# Patient Record
Sex: Female | Born: 1935
Health system: Southern US, Community
[De-identification: ages and names within clinical notes are randomized; demographics above are authoritative.]

## PROBLEM LIST (undated history)

## (undated) ENCOUNTER — Emergency Department (HOSPITAL_COMMUNITY): Admission: EM | Discharge status: Against Medical Advice | Payer: Medicare PPO

## (undated) DIAGNOSIS — I272 Pulmonary hypertension, unspecified: Secondary | ICD-10-CM

## (undated) DIAGNOSIS — G4700 Insomnia, unspecified: Secondary | ICD-10-CM

## (undated) DIAGNOSIS — I421 Obstructive hypertrophic cardiomyopathy: Secondary | ICD-10-CM

## (undated) DIAGNOSIS — K2100 Gastro-esophageal reflux disease with esophagitis, without bleeding: Secondary | ICD-10-CM

## (undated) DIAGNOSIS — I340 Nonrheumatic mitral (valve) insufficiency: Secondary | ICD-10-CM

## (undated) DIAGNOSIS — N183 Chronic kidney disease, stage 3 unspecified: Secondary | ICD-10-CM

## (undated) DIAGNOSIS — I351 Nonrheumatic aortic (valve) insufficiency: Secondary | ICD-10-CM

## (undated) DIAGNOSIS — K922 Gastrointestinal hemorrhage, unspecified: Secondary | ICD-10-CM

## (undated) DIAGNOSIS — F0390 Unspecified dementia without behavioral disturbance: Secondary | ICD-10-CM

## (undated) DIAGNOSIS — K219 Gastro-esophageal reflux disease without esophagitis: Secondary | ICD-10-CM

## (undated) DIAGNOSIS — I10 Essential (primary) hypertension: Secondary | ICD-10-CM

## (undated) DIAGNOSIS — I2510 Atherosclerotic heart disease of native coronary artery without angina pectoris: Secondary | ICD-10-CM

## (undated) DIAGNOSIS — K5731 Diverticulosis of large intestine without perforation or abscess with bleeding: Principal | ICD-10-CM

## (undated) DIAGNOSIS — E785 Hyperlipidemia, unspecified: Secondary | ICD-10-CM

## (undated) DIAGNOSIS — J45909 Unspecified asthma, uncomplicated: Secondary | ICD-10-CM

## (undated) DIAGNOSIS — R001 Bradycardia, unspecified: Secondary | ICD-10-CM

## (undated) HISTORY — DX: Insomnia, unspecified: G47.00

## (undated) HISTORY — DX: Unspecified asthma, uncomplicated: J45.909

## (undated) HISTORY — PX: HERNIA REPAIR: SHX51

## (undated) HISTORY — PX: TUBAL LIGATION: SHX77

## (undated) HISTORY — PX: COLONOSCOPY: SHX174

## (undated) HISTORY — DX: Obstructive hypertrophic cardiomyopathy: I42.1

## (undated) HISTORY — PX: COLON RESECTION: SHX5231

## (undated) HISTORY — PX: CARDIAC CATHETERIZATION: SHX172

## (undated) HISTORY — PX: CATARACT EXTRACTION W/ INTRAOCULAR LENS  IMPLANT, BILATERAL: SHX1307

## (undated) HISTORY — DX: Gastro-esophageal reflux disease without esophagitis: K21.9

## (undated) HISTORY — DX: Nonrheumatic aortic (valve) insufficiency: I35.1

## (undated) HISTORY — DX: Bradycardia, unspecified: R00.1

## (undated) HISTORY — DX: Gastro-esophageal reflux disease with esophagitis: K21.0

## (undated) HISTORY — PX: CHOLECYSTECTOMY: SHX55

## (undated) HISTORY — PX: APPENDECTOMY: SHX54

## (undated) HISTORY — DX: Pulmonary hypertension, unspecified: I27.20

## (undated) HISTORY — PX: TONSILLECTOMY: SUR1361

## (undated) HISTORY — DX: Gastro-esophageal reflux disease with esophagitis, without bleeding: K21.00

---

## 1998-02-18 ENCOUNTER — Other Ambulatory Visit: Admission: RE | Admit: 1998-02-18 | Discharge: 1998-02-18 | Payer: Self-pay | Admitting: Obstetrics and Gynecology

## 1998-02-25 ENCOUNTER — Ambulatory Visit (HOSPITAL_COMMUNITY): Admission: RE | Admit: 1998-02-25 | Discharge: 1998-02-25 | Payer: Self-pay | Admitting: Specialist

## 1998-02-25 ENCOUNTER — Encounter: Payer: Self-pay | Admitting: Specialist

## 1998-03-07 ENCOUNTER — Other Ambulatory Visit: Admission: RE | Admit: 1998-03-07 | Discharge: 1998-03-07 | Payer: Self-pay | Admitting: Obstetrics and Gynecology

## 1998-10-06 ENCOUNTER — Encounter (HOSPITAL_BASED_OUTPATIENT_CLINIC_OR_DEPARTMENT_OTHER): Payer: Self-pay | Admitting: General Surgery

## 1998-10-08 ENCOUNTER — Ambulatory Visit (HOSPITAL_COMMUNITY): Admission: RE | Admit: 1998-10-08 | Discharge: 1998-10-08 | Payer: Self-pay | Admitting: General Surgery

## 1999-01-13 ENCOUNTER — Ambulatory Visit (HOSPITAL_COMMUNITY): Admission: RE | Admit: 1999-01-13 | Discharge: 1999-01-13 | Payer: Self-pay | Admitting: Gastroenterology

## 1999-04-29 ENCOUNTER — Other Ambulatory Visit: Admission: RE | Admit: 1999-04-29 | Discharge: 1999-04-29 | Payer: Self-pay | Admitting: Obstetrics and Gynecology

## 1999-05-15 ENCOUNTER — Ambulatory Visit (HOSPITAL_COMMUNITY): Admission: RE | Admit: 1999-05-15 | Discharge: 1999-05-15 | Payer: Self-pay | Admitting: Obstetrics and Gynecology

## 1999-05-15 ENCOUNTER — Encounter: Payer: Self-pay | Admitting: Obstetrics and Gynecology

## 2000-05-26 ENCOUNTER — Encounter: Payer: Self-pay | Admitting: Obstetrics and Gynecology

## 2000-05-26 ENCOUNTER — Ambulatory Visit (HOSPITAL_COMMUNITY): Admission: RE | Admit: 2000-05-26 | Discharge: 2000-05-26 | Payer: Self-pay | Admitting: Obstetrics and Gynecology

## 2002-01-30 ENCOUNTER — Ambulatory Visit (HOSPITAL_COMMUNITY): Admission: RE | Admit: 2002-01-30 | Discharge: 2002-01-30 | Payer: Self-pay | Admitting: Gastroenterology

## 2002-04-09 ENCOUNTER — Other Ambulatory Visit: Admission: RE | Admit: 2002-04-09 | Discharge: 2002-04-09 | Payer: Self-pay | Admitting: Obstetrics and Gynecology

## 2002-04-13 ENCOUNTER — Encounter: Payer: Self-pay | Admitting: Obstetrics and Gynecology

## 2002-04-13 ENCOUNTER — Ambulatory Visit (HOSPITAL_COMMUNITY): Admission: RE | Admit: 2002-04-13 | Discharge: 2002-04-13 | Payer: Self-pay | Admitting: Obstetrics and Gynecology

## 2003-07-14 ENCOUNTER — Emergency Department (HOSPITAL_COMMUNITY): Admission: EM | Admit: 2003-07-14 | Discharge: 2003-07-14 | Payer: Self-pay | Admitting: Emergency Medicine

## 2004-04-20 ENCOUNTER — Emergency Department (HOSPITAL_COMMUNITY): Admission: EM | Admit: 2004-04-20 | Discharge: 2004-04-21 | Payer: Self-pay | Admitting: *Deleted

## 2004-04-28 ENCOUNTER — Encounter: Admission: RE | Admit: 2004-04-28 | Discharge: 2004-04-28 | Payer: Self-pay | Admitting: Gastroenterology

## 2004-05-08 ENCOUNTER — Encounter: Admission: RE | Admit: 2004-05-08 | Discharge: 2004-05-08 | Payer: Self-pay | Admitting: General Surgery

## 2005-05-24 ENCOUNTER — Encounter (INDEPENDENT_AMBULATORY_CARE_PROVIDER_SITE_OTHER): Payer: Self-pay | Admitting: Cardiology

## 2005-05-24 ENCOUNTER — Ambulatory Visit (HOSPITAL_COMMUNITY): Admission: RE | Admit: 2005-05-24 | Discharge: 2005-05-24 | Payer: Self-pay | Admitting: Cardiology

## 2005-06-07 HISTORY — PX: CARDIAC VALVE REPLACEMENT: SHX585

## 2005-06-07 HISTORY — PX: MYOMECTOMY: SHX85

## 2006-02-14 ENCOUNTER — Encounter: Admission: RE | Admit: 2006-02-14 | Discharge: 2006-02-14 | Payer: Self-pay | Admitting: Cardiology

## 2006-02-28 ENCOUNTER — Ambulatory Visit (HOSPITAL_COMMUNITY): Admission: RE | Admit: 2006-02-28 | Discharge: 2006-02-28 | Payer: Self-pay | Admitting: Cardiology

## 2006-04-11 ENCOUNTER — Encounter (INDEPENDENT_AMBULATORY_CARE_PROVIDER_SITE_OTHER): Payer: Self-pay | Admitting: Specialist

## 2006-04-11 ENCOUNTER — Inpatient Hospital Stay (HOSPITAL_COMMUNITY): Admission: RE | Admit: 2006-04-11 | Discharge: 2006-04-16 | Payer: Self-pay | Admitting: Cardiothoracic Surgery

## 2009-03-01 ENCOUNTER — Emergency Department (HOSPITAL_COMMUNITY): Admission: EM | Admit: 2009-03-01 | Discharge: 2009-03-01 | Payer: Self-pay | Admitting: Emergency Medicine

## 2009-03-01 ENCOUNTER — Emergency Department (HOSPITAL_COMMUNITY): Admission: EM | Admit: 2009-03-01 | Discharge: 2009-03-01 | Payer: Self-pay | Admitting: Family Medicine

## 2009-04-07 ENCOUNTER — Encounter: Admission: RE | Admit: 2009-04-07 | Discharge: 2009-04-07 | Payer: Self-pay | Admitting: Gastroenterology

## 2009-12-19 ENCOUNTER — Emergency Department (HOSPITAL_COMMUNITY): Admission: EM | Admit: 2009-12-19 | Discharge: 2009-12-20 | Payer: Self-pay | Admitting: Emergency Medicine

## 2010-01-01 ENCOUNTER — Encounter: Admission: RE | Admit: 2010-01-01 | Discharge: 2010-01-01 | Payer: Self-pay | Admitting: Family Medicine

## 2010-01-06 ENCOUNTER — Encounter: Admission: RE | Admit: 2010-01-06 | Discharge: 2010-01-06 | Payer: Self-pay | Admitting: Gastroenterology

## 2010-08-22 LAB — URINALYSIS, ROUTINE W REFLEX MICROSCOPIC
Bilirubin Urine: NEGATIVE
Glucose, UA: NEGATIVE mg/dL
Hgb urine dipstick: NEGATIVE
Ketones, ur: NEGATIVE mg/dL
Nitrite: NEGATIVE
Protein, ur: NEGATIVE mg/dL
Specific Gravity, Urine: 1.007 (ref 1.005–1.030)
Urobilinogen, UA: 0.2 mg/dL (ref 0.0–1.0)
pH: 7 (ref 5.0–8.0)

## 2010-08-22 LAB — DIFFERENTIAL
Basophils Absolute: 0 10*3/uL (ref 0.0–0.1)
Basophils Relative: 0 % (ref 0–1)
Eosinophils Absolute: 0.3 10*3/uL (ref 0.0–0.7)
Eosinophils Relative: 4 % (ref 0–5)
Lymphocytes Relative: 30 % (ref 12–46)
Lymphs Abs: 2.1 10*3/uL (ref 0.7–4.0)
Monocytes Absolute: 0.6 10*3/uL (ref 0.1–1.0)
Monocytes Relative: 9 % (ref 3–12)
Neutro Abs: 3.9 10*3/uL (ref 1.7–7.7)
Neutrophils Relative %: 56 % (ref 43–77)

## 2010-08-22 LAB — CBC
HCT: 32.7 % — ABNORMAL LOW (ref 36.0–46.0)
Hemoglobin: 11.2 g/dL — ABNORMAL LOW (ref 12.0–15.0)
MCH: 30.8 pg (ref 26.0–34.0)
MCHC: 34.2 g/dL (ref 30.0–36.0)
MCV: 89.8 fL (ref 78.0–100.0)
Platelets: 202 10*3/uL (ref 150–400)
RBC: 3.64 MIL/uL — ABNORMAL LOW (ref 3.87–5.11)
RDW: 14.1 % (ref 11.5–15.5)
WBC: 6.9 10*3/uL (ref 4.0–10.5)

## 2010-08-22 LAB — POCT CARDIAC MARKERS
CKMB, poc: <1 ng/mL — ABNORMAL LOW (ref 1.0–8.0)
Myoglobin, poc: 82.3 ng/mL (ref 12–200)
Troponin i, poc: <0.05 ng/mL (ref 0.00–0.09)

## 2010-08-22 LAB — POCT I-STAT, CHEM 8
BUN: 7 mg/dL (ref 6–23)
Calcium, Ion: 1.16 mmol/L (ref 1.12–1.32)
Chloride: 101 mEq/L (ref 96–112)
Creatinine, Ser: 1.4 mg/dL — ABNORMAL HIGH (ref 0.4–1.2)
Glucose, Bld: 97 mg/dL (ref 70–99)
HCT: 35 % — ABNORMAL LOW (ref 36.0–46.0)
Hemoglobin: 11.9 g/dL — ABNORMAL LOW (ref 12.0–15.0)
Potassium: 3.8 mEq/L (ref 3.5–5.1)
Sodium: 136 mEq/L (ref 135–145)
TCO2: 25 mmol/L (ref 0–100)

## 2010-08-22 LAB — URINE CULTURE
Colony Count: NO GROWTH
Culture: NO GROWTH

## 2010-08-22 LAB — CK: Total CK: 86 U/L (ref 7–177)

## 2010-09-11 LAB — CBC
HCT: 37.1 % (ref 36.0–46.0)
Hemoglobin: 12.7 g/dL (ref 12.0–15.0)
MCHC: 34.2 g/dL (ref 30.0–36.0)
MCV: 89.7 fL (ref 78.0–100.0)
Platelets: 217 10*3/uL (ref 150–400)
RBC: 4.14 MIL/uL (ref 3.87–5.11)
RDW: 13.9 % (ref 11.5–15.5)
WBC: 6.3 10*3/uL (ref 4.0–10.5)

## 2010-09-11 LAB — DIFFERENTIAL
Basophils Absolute: 0 10*3/uL (ref 0.0–0.1)
Basophils Relative: 0 % (ref 0–1)
Eosinophils Absolute: 0.2 10*3/uL (ref 0.0–0.7)
Eosinophils Relative: 3 % (ref 0–5)
Lymphocytes Relative: 30 % (ref 12–46)
Lymphs Abs: 1.9 10*3/uL (ref 0.7–4.0)
Monocytes Absolute: 0.6 10*3/uL (ref 0.1–1.0)
Monocytes Relative: 10 % (ref 3–12)
Neutro Abs: 3.5 10*3/uL (ref 1.7–7.7)
Neutrophils Relative %: 56 % (ref 43–77)

## 2010-09-11 LAB — COMPREHENSIVE METABOLIC PANEL
ALT: 17 U/L (ref 0–35)
AST: 19 U/L (ref 0–37)
Albumin: 3.8 g/dL (ref 3.5–5.2)
Alkaline Phosphatase: 47 U/L (ref 39–117)
BUN: 9 mg/dL (ref 6–23)
CO2: 26 mEq/L (ref 19–32)
Calcium: 9 mg/dL (ref 8.4–10.5)
Chloride: 100 mEq/L (ref 96–112)
Creatinine, Ser: 0.97 mg/dL (ref 0.4–1.2)
GFR calc Af Amer: >60 mL/min (ref >60)
GFR calc non Af Amer: 55 mL/min — ABNORMAL LOW (ref >60)
Glucose, Bld: 92 mg/dL (ref 70–99)
Potassium: 4 mEq/L (ref 3.5–5.1)
Sodium: 133 mEq/L — ABNORMAL LOW (ref 135–145)
Total Bilirubin: 0.9 mg/dL (ref 0.3–1.2)
Total Protein: 7.2 g/dL (ref 6.0–8.3)

## 2010-09-11 LAB — URINALYSIS, ROUTINE W REFLEX MICROSCOPIC
Bilirubin Urine: NEGATIVE
Glucose, UA: NEGATIVE mg/dL
Hgb urine dipstick: NEGATIVE
Ketones, ur: NEGATIVE mg/dL
Nitrite: NEGATIVE
Protein, ur: NEGATIVE mg/dL
Specific Gravity, Urine: 1.008 (ref 1.005–1.030)
Urobilinogen, UA: 0.2 mg/dL (ref 0.0–1.0)
pH: 6 (ref 5.0–8.0)

## 2010-09-11 LAB — TROPONIN I: Troponin I: 0.02 ng/mL (ref 0.00–0.06)

## 2010-09-11 LAB — URINE MICROSCOPIC-ADD ON

## 2010-10-20 NOTE — Assessment & Plan Note (Signed)
[facility]                                 ON-CALL NOTE   [name], [name]                        MRN:          [id number]  DATE:03/01/2009                            DOB:          March 05, 1925    This is an 77 year old patient of Dr. Margarita Rana I am covering for.  [name] was seen approximately 4-5 days ago by Dr. Loreta Ave.  The patient  states she was complaining of bloating and loose stools and abdominal  distress.  From what I can tell Align and Xifaxan were prescribed.  Overnight she felt worse and is having a boiling in her stomach and  postprandial pain and nausea and feels hot though she does not think she  has had a fever.  The stools are still somewhat loose but that may not  be as much of a problem.  She says she feels very weak.  She relates  that labs drawn by Dr. Loreta Ave were normal this week.  I advised her to go  to the [facility] to be evaluated further and to have  other diagnoses considered as a cause.  Her daughter was on the way to  her house to bring her there.     Iva Boop, MD,FACG  Electronically Signed    CEG/MedQ  DD: 03/01/2009  DT: 03/01/2009  Job #: [id number]

## 2010-10-23 NOTE — Cardiovascular Report (Signed)
Jasmine [name], ORLOV               ACCOUNT NO.:  [id number]   MEDICAL RECORD NO.:  [id number]          PATIENT TYPE:  OIB   LOCATION:  2899                         FACILITY:  [facility]   PHYSICIAN:  Armanda Magic, M.D.     DATE OF BIRTH:  [birth year]   DATE OF PROCEDURE:  DATE OF DISCHARGE:  02/28/2006                              CARDIAC CATHETERIZATION   PROCEDURE:  Left and right heart catheterization, coronary angiography, left  ventriculography   OPERATOR:  Armanda Magic, M.D.   INDICATIONS:  Severe mitral regurgitation.   COMPLICATIONS:  None.   IV ACCESS:  Right femoral artery, 6-French sheath.   MEDICATIONS:  Versed 1 mg IV   This is a very pleasant 81-year black female with a history of severe mitral  regurgitation.  She now presents for cardiac catheterization to rule out  underlying coronary artery disease prior to undergoing mitral valve  replacement; also to assess for pulmonary hypertension.   The patient was brought to the cardiac catheterization laboratory in a  fasting non sedated state.  Informed consent was obtained.  The patient was  connected to continuous heart rate and pulse oximetry monitoring and  intermittent blood pressure monitor.  The right groin was prepped and draped  in sterile fashion.  One percent Xylocaine was used for local anesthesia.  Using modified Seldinger technique a 6-French sheath was placed in the right  femoral artery.  Using modified Seldinger technique a 7-French sheath was  placed in the right femoral vein.  Under fluoroscopic guidance a 6-French  Swan-Ganz catheter was placed in the right atrium under balloon flotation  and fluoroscopy.  The right atrial pressure was measured.  The catheter was  then advanced into the right ventricle.  Right ventricular pressure was  measured.  The catheter was then attempted to be placed into the pulmonary  artery but could not.  It was eventually placed into the main pulmonary  artery with the  guidance of a guidewire.  The catheter was then allowed to  float into the wedge position and pulmonary capillary wedge pressure was  measured.  The balloon was then deflated.  The catheter was pulled back into  the main pulmonary artery where pulmonary artery pressure was measured.  Cardiac output by thermal dilution was then obtained using 10 cc of saline  with each injection.  The catheter was then removed under fluoroscopic  guidance and under fluoroscopic guidance a 6-French JL-4 catheter was placed  in the left coronary artery.  Multiple cine films were taken in 30 degree  RAO, 40 degree LAO views.  The catheter was exchanged out for 6-French JR-4  catheter which was placed under fluoroscopic guidance in the right coronary  artery.  Multiple cine films were taken at 30 degree RAO and 40 degree LAO  views.  This catheter was exchanged out over a guidewire for 6-French angled  pigtail catheter which was placed under fluoroscopic guidance in the left  ventricular cavity.  Left ventriculography was performed in 30 degree RAO  view using total of 30 cc of contrast at 15 cc per second.  The catheter was  then pulled back across the aortic valve with no significant gradient noted.  At the end of the procedure all catheters and sheaths were removed.  Manual  compression was performed until adequate hemostasis was obtained.  The  patient was transferred back to room in stable condition.   RESULTS:  1. Right heart cath data:  Right atrial pressure 4/3 with a mean of 2      mmHg.  RV pressure 30/0 with a mean of 3 mmHg.  PA pressure 26/5 with a      mean of 14 mmHg.  Pulmonary capillary wedge 7/7 with a mean of 5 mmHg.      Left ventricular pressure 175 over 1 mmHg, LVEDP 17 mmHg.  Aortic      pressure was 66/39 mmHg.  The cardiac output by thermal dilution was      3.1.  Overall left ventricular systolic function was normal.  EF 60%.      Left atrium was markedly dilated with severe mitral  regurgitation which      opacified the left atrium within the first cardiac beat.   The left coronary artery is widely patent and bifurcates in the left  anterior descending artery and left circumflex artery.  Left anterior  descending artery is widely patent throughout its course, the apex giving  rise to one very large diagonal branch.  Diagonal has a 30% eccentric  narrowing in proximal portion and trifurcates into three daughter vessels,  all of which are widely patent.   The left circumflex is widely patent throughout its course in the AV groove,  giving rise to three obtuse marginal branches, all of which are widely  patent.   The right coronary artery has luminal irregularities throughout with an 20-  30% eccentric narrowing in the ostium of the RCA.  It bifurcates distally  into the posterior descending artery and posterolateral artery, both of  which are widely patent.   ASSESSMENT:  1. Nonobstructive coronary disease.  2. Normal LV function.  3. Dilated left atrium with severe mitral regurgitation.  4. No evidence of pulmonary hypertension.  5. Elevated left ventricular end-diastolic pressure secondary to MR.   PLAN:  Discharge home after IV fluid and bedrest.  Outpatient CVTS  consult  with Dr. Morton Peters for mitral valve repair.  Follow up with me in three  months.      Armanda Magic, M.D.  Electronically Signed     TT/MEDQ  D:  03/01/2006  T:  03/02/2006  Job:  [id number]   cc:   Kerin Perna, M.D.

## 2010-10-23 NOTE — Op Note (Signed)
NAMEIVAN, MASKELL NO.:  [id number]   MEDICAL RECORD NO.:  [id number]          PATIENT TYPE:  INP   LOCATION:  2305                         FACILITY:  [facility]   PHYSICIAN:  Kaylyn Layer. Michelle Piper, M.D.   DATE OF BIRTH:  Jan 19, 1925   DATE OF PROCEDURE:  04/11/2006  DATE OF DISCHARGE:                                 OPERATIVE REPORT   PROCEDURE PERFORMED:  Transesophageal echocardiogram probe placement and  monitoring during mitral valve replacement.   BODY OF REPORT:  Was consulted by Dr. Kathlee Nations Trigt to place a  transesophageal echo into [facility] for intraoperative evaluation and help  with management during her mitral valve replacement procedure.   Miss Woodbury is a 77 year old lady with significant mitral regurgitation, who  presented to the operating room today for mitral valve replacement.   After uneventful induction of anesthesia and endotracheal intubation, the  transesophageal echo probe was easily placed into the stomach.  Initial  short axis view of the left ventricle showed significant left ventricular  hypertrophy with a small, asymmetric ventricular cavity which collapsed  almost completely on systole.  Her heart was rotated, so it was difficult to  get a good four-chamber view, but initially looking at the left ventricular  outflow tract, there was some septal hypertrophy with narrowing of the  outflow tract.  Turning to the mitral valve, the valve leaflets seemed to  coapt normally.  Placing color flow Doppler across the valve, there was 3 to  4+ mitral regurgitation into the left atrium.  This was present in all  views.  On imaging the 160 to 180 view with the aortic valve and outflow  tract being imaged, it clearly could be seen that there was SAM with  collapse of the anterior leaflet of the mitral valve against the  intraventricular septum during systole, creating a mitral regurgitation  situation.  On evaluating the valve from the deep gastric  view, there was a  gradient of 95 mmHg, which was seen with dagger-shaped images in continuous  wave Doppler.  This seemed to suggest that the dominant lesion was outflow  obstruction, which was subaortic in nature.  Returning to the aortic valve,  it was tricuspid.  There was a small bit of central aortic insufficiency,  but otherwise  was within normal limits.  Intra-atrial septum was normal,  the left atrium was normal.  The pulmonary vein was forward.  The rest of  the heart was normal, but there was a small amount of tricuspid  regurgitation transferred with the presence of a pulmonary artery catheter.   The patient was placed on cardiopulmonary bypass by Dr. Donata Clay and  underwent a mitral valve replacement and myomectomy.  Immediately prior to  discontinuing cardiopulmonary bypass, I evaluated the patient.  There was  minimal intracardiac air.  The left ventricle had a small cavity and was  discontinued from cardiopulmonary bypass with minimal inotropes and there  were no wall motion abnormalities in the left ventricle at this point.  The  prosthetic mitral valve could be seen to be functioning normally.  There was  absence of SAM and the mitral regurgitation, and the outflow tract, despite  there being some turbulence seen, was more patent, although it was difficult  to measure, due to shudders from the prosthetic valve.  Otherwise, the rest  of the cardiac exam was unchanged.   At the end of the procedure, the transesophageal echo probe was removed from  the patient.  She was taken to the intensive care unit in stable condition.           ______________________________  Kaylyn Layer Michelle Piper, M.D.     KDO/MEDQ  D:  04/11/2006  T:  04/12/2006  Job:  [id number]   cc:   Anesthesia Department

## 2010-10-23 NOTE — Op Note (Signed)
   NAME:  Jasmine [name], Jasmine [name]                         ACCOUNT NO.:  [id number]   MEDICAL RECORD NO.:  [id number]                   PATIENT TYPE:  AMB   LOCATION:  ENDO                                 FACILITY:  [facility]   PHYSICIAN:  Charna Elizabeth, M.D.                   DATE OF BIRTH:  May 25, 1925   DATE OF PROCEDURE:  01/30/2002  DATE OF DISCHARGE:                                 OPERATIVE REPORT   PROCEDURE:  Colonoscopy.   ENDOSCOPIST:  Charna Elizabeth, M.D.   INSTRUMENT USED:  Olympus video colonoscope.   INDICATIONS:  A 77 year old African-American female with a history of  sigmoid colectomy for a tumor, status post takedown of colostomy,  undergoing screening colonoscopy.  Rule out colonic polyps, masses,  hemorrhoids, etc.   PREPROCEDURE PREPARATION:  Informed consent was procured from the patient.  The patient was fasted for eight hours prior to the procedure and prepped  with a gallon of magnesium citrate and a gallon of NuLytely the night prior  to the procedure.   PREPROCEDURE PHYSICAL:  VITAL SIGNS:  The patient had stable vital signs.  NECK:  Supple.  CHEST:  Clear to auscultation.  S1, S2 regular.  ABDOMEN:  Soft with normal bowel sounds.   DESCRIPTION OF PROCEDURE:  The patient was placed in the left lateral  decubitus position and sedated with 30 mg of Demerol and 3 mg of Versed  intravenously.  Once the patient was adequately sedate and maintained on low-  flow oxygen and continuous cardiac monitoring, the Olympus video colonoscope  was advanced from the rectum to the cecum without difficulty.  There was  evidence of extensive pandiverticulosis with a healthy anastomosis at 15 cm.  Small internal hemorrhoids were seen on retroflexion.   IMPRESSION:  1. Pandiverticulosis.  2. Healthy anastomosis at 15 cm.  3. Small, nonbleeding internal hemorrhoids.   RECOMMENDATIONS:  1. Repeat colorectal cancer screening is recommended in the next five years     unless the patient  develops any abnormal symptoms in the interim.  2.     A high-fiber diet has been advised, and brochures on diverticulosis have     been handed to the patient for her education.  3. Outpatient follow-up on a p.r.n. basis.                                               Charna Elizabeth, M.D.    JM/MEDQ  D:  01/31/2002  T:  02/04/2002  Job:  [id number]   cc:   Jethro Bastos, M.D.  [address]  [place]  [place] [address]  Fax: [phone]   Mardene Celeste. Lurene Shadow, M.D.

## 2010-10-23 NOTE — Op Note (Signed)
NAMEWALTER, MIN NO.:  [id number]   MEDICAL RECORD NO.:  [id number]          PATIENT TYPE:  INP   LOCATION:  2305                         FACILITY:  [facility]   PHYSICIAN:  Kerin Perna, M.D.  DATE OF BIRTH:  08/05/1924   DATE OF PROCEDURE:  04/11/2006  DATE OF DISCHARGE:                                 OPERATIVE REPORT   OPERATION:  1. Left ventricular septal myomectomy for idiopathic hypertrophic      subaortic stenosis.  2. Mitral valve replacement with a 27-mm Biocor pericardial valve, model      number B10-5M, serial number [id number].   SURGEON:  Kerin Perna, M.D.   ASSISTANT:  Evelene Croon, M.D.   ANESTHESIA:  General by Arta Bruce, M.D.   PREOPERATIVE DIAGNOSIS:  Idiopathic hypertrophic subaortic stenosis with  severe mitral regurgitation.   POSTOPERATIVE DIAGNOSIS:  Idiopathic hypertrophic subaortic stenosis with  severe mitral regurgitation.   INDICATIONS:  The patient is a 77 year old female with a loud murmur and an  echocardiogram showing severe LV concentric hypertrophy with systolic  anterior motion of the mitral valve and severe mitral regurgitation.  She  had no significant coronary disease and the aortic valve itself was intact.  It was felt the patient would benefit for mitral valve replacement or repair  and myomectomy for her subannular outflow tract obstruction associated with  severe mitral regurgitation.   Prior to the operation, I examined the patient in the office and reviewed  results the cardiac cath and echo with the patient.  I discussed indications  and expected benefits of mitral valve repair or replacement and LV  myomectomy.  I reviewed the alternatives to surgical therapy.  I discussed  with the patient the major issues of the surgery including the location of  the incisions, the use of general anesthesia and cardiopulmonary bypass, and  the expected postoperative hospital recovery.  I discussed with the  patient  the preference to use a bioprosthetic valve or a mechanical valve and mitral  valve replacement was indicated rather than mitral valve repair.  I  discussed with her the risks of mitral valve replacement and LV myomectomy  including the risks of heart block with pacemaker requirement, bleeding,  blood transfusion requirement, MI, stroke, infection, and death.  She  understood these issues and agreed to proceed with operation under what I  felt was an informed consent.   OPERATIVE FINDINGS:  The preoperative TEE showed severe subaortic outflow  tract obstruction with a gradient over 50 mmHg.  The aortic valve itself was  without significant stenosis or insufficiency.  There was severe septal  anterior motion (SAM) of the anterior leaf of the mitral valve with severe  mitral regurgitation.  It was felt that in a patient almost 77 years old  that the best procedure in the situation would be septal myomectomy with  mitral valve replacement with a bioprosthetic valve.   PROCEDURE:  The patient was brought to the operating room and placed supine  on the operating table, where general anesthesia was induced.  The  transesophageal 2-D echo probe was  placed by the anesthesiologist.  A  diagnosis of subaortic stenosis with systolic anterior motion of the mitral  valve and severe mitral regurgitation was documented.  There was no evidence  of prolapse of the anterior leaflet or posterior leaflet segments.  The  patient was prepped and draped as a sterile field.  A sternal incision was  made.  The sternum was retracted and the pericardium was opened.  Heparin  was administered.  Pursestrings were placed in the ascending aorta and right  atrium and the patient was cannulated and placed on bypass.  A second  pursestring was placed in the lower right atrium for double cannulation and  optimal venous drainage.  The interatrial groove was dissected and caval  tapes were placed around the  superior and inferior vena caval cannula.  Cardioplegia catheters were placed for both antegrade aortic and retrograde  coronary sinus cardioplegia.  The patient was cooled to 30 degrees and the  aortic crossclamp was applied.  Eight hundred milliliters of cold blood  cardioplegia were delivered in split doses between the aortic cannula and  the coronary sinus cannula.  There was good cardioplegic arrest and septal  temperature dropped to less than 12 degrees.   A transverse aortotomy was made and the aortic valve was inspected.  It had  3 cusps, which were normal.  There was no evidence of stenosis.  The valve  was retracted and subannular septal muscle was inspected.  There was  evidence of a plaque formation under the right coronary cusp and this was  excised using a 15 blade scalpel to create a trench 1 cm wide and 8 mm deep,  extending down into the septum.  This was submitted to Pathology.  A second  area of white plaque formation from abnormal contact with the anterior  leaflet of the mitral valve closer to the right coronary commissure was also  excised.  The area was irrigated and inspected and it was felt that  there  was a good outflow tract created from the myomectomy.  The aortotomy was  then closed in 2 layers using running 4-0 Prolene.  Cardioplegia was redosed  every 20 minutes during the crossclamp period.   A left atriotomy was then made as the caval tapes were tightened.  The  atrial retractors were positioned.  Exposure was felt to be adequate.  The  anterior leaflet was thickened.  Valve analysis with cold saline test showed  no evidence of segmental prolapse.  This was confirmed by valve analysis  using the mitral hooks for the P1 segment as the reference point.  There did  appeared to be some central mitral regurgitation from the saline test.  Two  trigonal angioplasty sutures were placed to assist with exposure.  I was hesitant to do a mitral valve repair using an  annuloplasty ring because of  concern over worsening the systolic anterior motion of the anterior leaflet.  The anterior leaflet did not appeared to be redundant, but just thickened.  It was felt the main mechanism for mitral regurgitation was abnormal  coaptation from limitation of the outflow tract against the anterior  leaflet.  After assessing the mitral valve for a significant period of time,  it was elected to proceed with replacement with a bioprosthetic valve as the  best option in this 77 year old female.  The anterior leaflet was excised  and the posterior leaflet was preserved.  Supra-annular 2-0 pledgeted  sutures were placed around the annulus numbering 13 total  sutures.  The 27-  mm bioprosthetic Biocor valve was rinsed and prepared according to protocol.  The sutures were placed through the sewing ring and the valve was seated and  the sutures were tied.  The valve fit the annulus optimally.  A vent was  placed through the valve into the left ventricle to remove air.  The  aortotomy was then closed in 2 layers using running 4-0 Prolene.  The  patient was then rewarmed and a dose of retrograde warm blood cardioplegia  was given (hot shot) to remove air from the coronaries and the usual de-  airing maneuvers were made by retaining volume in the heart.  The crossclamp  was then removed as the ascending aortic vent and LV vent were then turned  to suction.   The heart was cardioverted back to a regular rhythm.  The cardioplegia  cannulas were removed.  The venous cannulas were repositioned into the right  atrium.  The patient was rewarmed to 37 degrees.  Temporary pacing wires  were applied.  When the patient was rewarmed, the lungs re-expanded and the  ventilator was resumed.  The patient weaned from bypass, being A-V  sequentially paced.  No inotropes were given due to the concern over  systolic anterior motion and subaortic gradient.  The patient was stable  hemodynamically.   The echo showed improved LV outflow tract and no mitral  regurgitation.  Protamine was administered without adverse reaction.  The  cannulas were removed.  The mediastinum was irrigated with warm antibiotic  irrigation.  The patient had persistent coagulopathy and did require 4 units  of packed cells on bypass, so she was given a unit of platelets and a unit  of FFP.  This improved coagulation function.  The superior pericardial fat  was closed over the aorta.  Two mediastinal chest tubes  were placed and brought out through separate incisions.  The sternum was  closed with interrupted steel wire.  The pectoralis fascia was closed with a  running #1 Vicryl.  The subcutaneous and skin layers were closed in running  Vicryl and sterile dressings were applied.  Total bypass time was 150  minutes with crossclamp time of 100 minutes.      Kerin Perna, M.D.  Electronically Signed     PV/MEDQ  D:  04/11/2006  T:  04/12/2006  Job:  [id number]   cc:   CVTS Office  Armanda Magic, M.D.

## 2010-10-23 NOTE — Discharge Summary (Signed)
Jasmine [name], NEUBAUER NO.:  [id number]   MEDICAL RECORD NO.:  [id number]          PATIENT TYPE:  INP   LOCATION:  2034                         FACILITY:  [facility]   PHYSICIAN:  Kerin Perna, M.D.  DATE OF BIRTH:  Nov 14, 1924   DATE OF ADMISSION:  04/11/2006  DATE OF DISCHARGE:                                 DISCHARGE SUMMARY   PRIMARY CARE PHYSICIAN:  Dr. Marny Lowenstein.   CARDIOLOGIST:  Dr. Armanda Magic.   ADMISSION DIAGNOSIS:  Severe mitral regurgitation with class III congestive  heart failure.   DISCHARGE DIAGNOSES:  1. Idiopathic hypertrophic subaortic stenosis with severe mitral      regurgitation status post mitral valve repair.  2. History of congestive heart failure.  3. Gastroesophageal reflux disease.  4. Hypertension.  5. Incomplete left bundle branch block.  6. Allergic rhinitis.  7. Asthma.  8. Status post sigmoid resection for diverticular disease.   CONSULTS:  None.   PROCEDURE:  1. On 04/11/2006, the patient underwent a transesophageal echocardiogram,      probe placement, and monitoring during mitral valve replacement by Dr.      Arta Bruce.  2. On 04/11/2006, the patient underwent left ventricular septal myectomy      for idiopathic hypertrophic subaortic stenosis and mitral valve      replacement with a 27-mm Biocore pericardial valve, #B10/26M, by Dr.      Kathlee Nations Trigt.   HISTORY AND PHYSICAL:  This is a patient of Dr. Norris Cross, who has been  followed with serial 2D echos over the past 3 years, and has progressed in  severity of mitral regurgitation.  She was found to have a loud murmur and  echocardiogram showing severe LV concentric hypertrophy with systolic  anterior motion in the mitral valve and severe mitral regurgitation.  She  had no significant coronary artery disease and aortic valve was intact.  It  was thus felt that the patient would benefit from a mitral valve replacement  or repair, and myomectomy for her  subannular outflow tract obstruction  associated with severe MR.  Risks and benefits were explained to the patient  and she agreed to proceed.  Please see dictated history and physical for  further details.   HOSPITAL COURSE:  On April 11, 2006, the patient underwent LV septal  myectomy, mitral valve repair, without any complications.  The patient's  hospital stay has remained uneventful and she is progressing quite well.  Postoperatively, the patient was able to be extubated.  She was on CPAP at 8  p.m., April 11, 2006.  The patient's potassium was 3.3 and hematocrit was  28.  The patient was A paced at 90 beats per minute with blood pressure  138/70.   On postoperative day #1, the patient's O2 saturations were 97% on room air.  She did have sinus bradycardia at 54 beats per minute and her beta-blocker  was held secondary to a decrease in blood pressure.  On postoperative day  #2, the patient was AVI paced.  The blood pressure was 150/70, and she was  restarted on her  blood pressure medications.  The patient did have some  volume overload postoperatively and she is being diuresed appropriately with  Lasix.  The patient was started on Coumadin for a mitral valve repair short  term on postoperative day #2.  The patient is having INR checked daily, and  her Coumadin will be titrated until therapeutic.   Postoperatively, the patient did have leukocytosis.  She did remain  afebrile.  Her chest x-rays have remained clear except for some mild  atelectasis.  Her leukocytosis is trending downward.  We will continue to  monitor this closely.  The patient did not have a bowel movement on  postoperative day #3, and she was given milk of magnesia.  The patient's  heart rate was in the 70s on postoperative day #3 and her pacer was turned  off.  She did experience some hypokalemia and her potassium is being  replaced with potassium chloride.  The patient did experience some acute  blood loss  anemia postoperatively; however, she did not require any blood  transfusion and her hemoglobins have remained stable and is raising  appropriately.   The patient is continuing her incentive spirometry as directed.  She is  working with cardiac rehab.  Currently she is ambulating for more than 12  feet on room air with a rolling walker and assist x1, with a steady gait.   PHYSICAL EXAMINATION:  Afebrile.  Vital signs stable.  Cardiac:  Regular  rate and rhythm.  Lungs:  Clear to auscultation bilaterally.  Abdomen:  Benign.  Incisions clear, dry, and intact.  Extremities:  No edema.  The  patient had CBC which showed a white blood cell count of 10.5, hemoglobin of  10.9, hematocrit 31.2, platelet count 134.  BMP showed a sodium of 138,  potassium 3.2, chloride 102, CO2 28, BUN 11, creatinine 0.7, and glucose of  104.  INR 1.2.   DISCHARGE DISPOSITION:  The patient will be discharged home in the next 1-2  days, provided his blood pressure remains stable and he remains afebrile.  The patient continues to progress in a positive manner.   MEDICATIONS:  1. Aspirin 81 mg p.o. daily.  2. Norvasc 10 mg p.o. daily.  3. Lisinopril 10 mg p.o. daily.  4. Oxycodone 1-2 tabs every 4 hours p.r.n.  5. Lasix 40 mg p.o. daily x7 days.  6. Potassium chloride 20 mEq p.o. daily x7 days.  7. Hydrochlorothiazide 25 mg p.o. daily.  8. Coumadin.  The dosage will be given at the time of discharge.  This      will be followed by Dr. Norris Cross office and will be titrated until      therapeutic.  9. Allegra 180 mg p.o. p.r.n.  10.Lorazepam 0.5 mg p.o. daily p.r.n.  11.Prilosec 20 mg p.o. daily.  12.Calcitrate Plus D 600 mg p.o. daily.  13.Cosopt eye drops 1 drop OU b.i.d.  14.Albuterol MDI 1 puff every 4 hours p.r.n.  15.Nasacort as needed.   INSTRUCTIONS:  The patient was instructed to follow a low fat, low salt  diet.  No driving or heavy lifting greater than 10 pounds.  The patient is to ambulate daily  and increase activity as tolerated.  Continue his  breathing exercises.  He may shower and clean his incisions with mild soap  and water.  He is to call our office if any problems shall arise.   FOLLOWUP:  The patient will follow up with Dr. Donata Clay in 3 weeks.  Office  will contact him with time and date of appointment.  The patient is to call  Dr. Mayford Knife for an appointment in 2 weeks, where he will have a chest x-ray  taken.  The patient is instructed to bring his chest x-ray with him at Dr.  Zenaida Niece Trigt's appointment.  Also, he is to follow up with Dr. Mayford Knife on  Monday, November 12, for his INR to be checked.      Constance Holster, [place]      Kerin Perna, M.D.  Electronically Signed    JMW/MEDQ  D:  04/14/2006  T:  04/15/2006  Job:  [id number]   cc:   Armanda Magic, M.D.

## 2010-12-19 ENCOUNTER — Emergency Department (HOSPITAL_COMMUNITY): Payer: Medicare PPO

## 2010-12-19 ENCOUNTER — Inpatient Hospital Stay (HOSPITAL_COMMUNITY)
Admission: EM | Admit: 2010-12-19 | Discharge: 2010-12-22 | DRG: 292 | Disposition: A | Discharge status: Home / Self Care | Payer: Medicare PPO | Attending: Cardiology | Admitting: Cardiology

## 2010-12-19 DIAGNOSIS — Z7982 Long term (current) use of aspirin: Secondary | ICD-10-CM

## 2010-12-19 DIAGNOSIS — I080 Rheumatic disorders of both mitral and aortic valves: Secondary | ICD-10-CM | POA: Diagnosis present

## 2010-12-19 DIAGNOSIS — R5383 Other fatigue: Secondary | ICD-10-CM

## 2010-12-19 DIAGNOSIS — K219 Gastro-esophageal reflux disease without esophagitis: Secondary | ICD-10-CM | POA: Diagnosis present

## 2010-12-19 DIAGNOSIS — K5732 Diverticulitis of large intestine without perforation or abscess without bleeding: Secondary | ICD-10-CM | POA: Diagnosis present

## 2010-12-19 DIAGNOSIS — I447 Left bundle-branch block, unspecified: Secondary | ICD-10-CM | POA: Diagnosis present

## 2010-12-19 DIAGNOSIS — R079 Chest pain, unspecified: Secondary | ICD-10-CM

## 2010-12-19 DIAGNOSIS — I10 Essential (primary) hypertension: Secondary | ICD-10-CM | POA: Diagnosis present

## 2010-12-19 DIAGNOSIS — I4891 Unspecified atrial fibrillation: Secondary | ICD-10-CM | POA: Diagnosis present

## 2010-12-19 DIAGNOSIS — I2510 Atherosclerotic heart disease of native coronary artery without angina pectoris: Secondary | ICD-10-CM | POA: Diagnosis present

## 2010-12-19 DIAGNOSIS — R5381 Other malaise: Secondary | ICD-10-CM

## 2010-12-19 DIAGNOSIS — I5031 Acute diastolic (congestive) heart failure: Principal | ICD-10-CM | POA: Diagnosis present

## 2010-12-19 DIAGNOSIS — J45909 Unspecified asthma, uncomplicated: Secondary | ICD-10-CM | POA: Diagnosis present

## 2010-12-19 DIAGNOSIS — Z954 Presence of other heart-valve replacement: Secondary | ICD-10-CM

## 2010-12-19 DIAGNOSIS — F0390 Unspecified dementia without behavioral disturbance: Secondary | ICD-10-CM | POA: Diagnosis present

## 2010-12-19 DIAGNOSIS — I4949 Other premature depolarization: Secondary | ICD-10-CM | POA: Diagnosis present

## 2010-12-19 DIAGNOSIS — I509 Heart failure, unspecified: Secondary | ICD-10-CM | POA: Diagnosis present

## 2010-12-19 LAB — CBC
HCT: 36.9 % (ref 36.0–46.0)
Hemoglobin: 12.9 g/dL (ref 12.0–15.0)
RDW: 13.6 % (ref 11.5–15.5)
WBC: 6.2 10*3/uL (ref 4.0–10.5)

## 2010-12-19 LAB — BASIC METABOLIC PANEL
BUN: 13 mg/dL (ref 6–23)
Chloride: 101 mEq/L (ref 96–112)
GFR calc Af Amer: >60 mL/min (ref >60)
GFR calc non Af Amer: 54 mL/min — ABNORMAL LOW (ref >60)
Glucose, Bld: 96 mg/dL (ref 70–99)
Potassium: 3.7 mEq/L (ref 3.5–5.1)
Sodium: 137 mEq/L (ref 135–145)

## 2010-12-19 LAB — OCCULT BLOOD, POC DEVICE: Fecal Occult Bld: NEGATIVE

## 2010-12-19 LAB — CK TOTAL AND CKMB (NOT AT ARMC)
CK, MB: 1.9 ng/mL (ref 0.3–4.0)
Relative Index: 1.7 (ref 0.0–2.5)

## 2010-12-19 LAB — TROPONIN I: Troponin I: <0.3 ng/mL (ref <0.30)

## 2010-12-20 LAB — CK TOTAL AND CKMB (NOT AT ARMC): Total CK: 132 U/L (ref 7–177)

## 2010-12-20 LAB — CARDIAC PANEL(CRET KIN+CKTOT+MB+TROPI)
Relative Index: 3.2 — ABNORMAL HIGH (ref 0.0–2.5)
Total CK: 123 U/L (ref 7–177)
Troponin I: 0.4 ng/mL (ref <0.30)

## 2010-12-20 LAB — HEMOGLOBIN A1C
Hgb A1c MFr Bld: 5.6 % (ref <5.7)
Mean Plasma Glucose: 114 mg/dL (ref <117)

## 2010-12-20 LAB — TROPONIN I: Troponin I: 0.65 ng/mL (ref <0.30)

## 2010-12-20 LAB — COMPREHENSIVE METABOLIC PANEL
AST: 22 U/L (ref 0–37)
Albumin: 3.5 g/dL (ref 3.5–5.2)
BUN: 10 mg/dL (ref 6–23)
Calcium: 9.4 mg/dL (ref 8.4–10.5)
Chloride: 107 mEq/L (ref 96–112)
Creatinine, Ser: 0.7 mg/dL (ref 0.50–1.10)
Total Bilirubin: 0.6 mg/dL (ref 0.3–1.2)
Total Protein: 7 g/dL (ref 6.0–8.3)

## 2010-12-20 LAB — PRO B NATRIURETIC PEPTIDE: Pro B Natriuretic peptide (BNP): 1683 pg/mL — ABNORMAL HIGH (ref 0–450)

## 2010-12-20 LAB — HEPARIN LEVEL (UNFRACTIONATED): Heparin Unfractionated: 0.57 IU/mL (ref 0.30–0.70)

## 2010-12-21 ENCOUNTER — Inpatient Hospital Stay (HOSPITAL_COMMUNITY): Payer: Medicare PPO

## 2010-12-21 LAB — TSH: TSH: 2.21 u[IU]/mL (ref 0.350–4.500)

## 2010-12-21 LAB — CBC
HCT: 31.6 % — ABNORMAL LOW (ref 36.0–46.0)
Hemoglobin: 10.8 g/dL — ABNORMAL LOW (ref 12.0–15.0)
MCHC: 34.2 g/dL (ref 30.0–36.0)
WBC: 5.8 10*3/uL (ref 4.0–10.5)

## 2010-12-21 LAB — DIFFERENTIAL
Basophils Absolute: 0 10*3/uL (ref 0.0–0.1)
Basophils Relative: 1 % (ref 0–1)
Lymphocytes Relative: 35 % (ref 12–46)
Monocytes Absolute: 0.6 10*3/uL (ref 0.1–1.0)
Neutro Abs: 2.9 10*3/uL (ref 1.7–7.7)
Neutrophils Relative %: 50 % (ref 43–77)

## 2010-12-21 LAB — BASIC METABOLIC PANEL
CO2: 28 mEq/L (ref 19–32)
Calcium: 8.5 mg/dL (ref 8.4–10.5)
GFR calc non Af Amer: 55 mL/min — ABNORMAL LOW (ref >60)
Glucose, Bld: 89 mg/dL (ref 70–99)
Potassium: 3.9 mEq/L (ref 3.5–5.1)
Sodium: 139 mEq/L (ref 135–145)

## 2010-12-21 LAB — HEPARIN LEVEL (UNFRACTIONATED): Heparin Unfractionated: 0.68 IU/mL (ref 0.30–0.70)

## 2010-12-21 MED ORDER — TECHNETIUM TC 99M TETROFOSMIN IV KIT
30.0000 | PACK | Freq: Once | INTRAVENOUS | Status: AC | PRN
  Administered 2010-12-21: 30 via INTRAVENOUS

## 2010-12-21 MED ORDER — TECHNETIUM TC 99M TETROFOSMIN IV KIT
10.0000 | PACK | Freq: Once | INTRAVENOUS | Status: AC | PRN
  Administered 2010-12-21: 10 via INTRAVENOUS

## 2010-12-22 ENCOUNTER — Other Ambulatory Visit (HOSPITAL_COMMUNITY): Payer: Medicare PPO

## 2010-12-22 LAB — CBC
HCT: 33.2 % — ABNORMAL LOW (ref 36.0–46.0)
Hemoglobin: 11.3 g/dL — ABNORMAL LOW (ref 12.0–15.0)
MCH: 29.9 pg (ref 26.0–34.0)
MCHC: 34 g/dL (ref 30.0–36.0)
MCV: 87.8 fL (ref 78.0–100.0)
RBC: 3.78 MIL/uL — ABNORMAL LOW (ref 3.87–5.11)

## 2010-12-24 NOTE — Discharge Summary (Signed)
Jasmine [name], GOEHRING NO.:  [id number]  MEDICAL RECORD NO.:  [id number]  LOCATION:  3704                         FACILITY:  [facility]  PHYSICIAN:  Armanda Magic, M.D.     DATE OF BIRTH:  June 22, 1924  DATE OF ADMISSION:  12/20/2010 DATE OF DISCHARGE:  12/22/2010                              DISCHARGE SUMMARY   ADMISSION DIAGNOSES: 1. Chest pain. 2. Idiopathic hypertrophic subaortic stenosis, status post septal     myomectomy. 3. Severe mitral regurgitation, status post mitral valve replacement     with 27-mm Biocor pericardial mitral valve in 2007. 4. Nonobstructive coronary artery disease by left heart     catheterization in 2007. 5. Gastroesophageal reflux disease. 6. Hypertension. 7. Left bundle-branch block. 8. Asthma. 9. Diverticulitis.  DISCHARGE DIAGNOSES: 1. Chest pain with elevated troponin, normal CPK-MBs with negative     nuclear stress test, possibly secondary to acute diastolic heart     failure. 2. Elevated BNP consistent with acute diastolic heart failure. 3. Idiopathic hypertrophic subaortic stenosis, status post septal     myomectomy. 4. Severe mitral regurgitation, status post pericardial tissue aortic     valve replacement. 5. Nonobstructive coronary artery disease by catheterization in 2007. 6. Gastroesophageal reflux disease. 7. Hypertension. 8. Left bundle-branch block. 9. Asthma 10.Diverticulitis. 11.Frequent premature ventricular contractions. 12.One short episode of paroxysmal atrial fibrillation during hospital     stay, otherwise normal sinus rhythm throughout her stay.  PROCEDURES:  Stress test.  HISTORY OF PRESENT ILLNESS:  This is an 77 year old female with a history of hypertension, HOCM, and severe MR status post septal myomectomy and mitral valve replacement with pericardial tissue valve in 2007 with nonobstructive coronary artery disease at that time who presented to the hospital on December 20, 2010, with chest pain and  fatigue. She had had intermittent chest pain for the past week, but it did not appear to be associated exertion.  She was also having some fatigue and been seen in my office last week where TSH was normal.  She had noted the heart rate in the upper 40s and a Holter monitor was ordered which showed a baseline average heart rate of 56-59 beats per minute with frequent ventricular ectopy and an episode of nonsustained atrial tachycardia, but no atrial fibrillation.  She described her pain as atypical, feeling like gas that was coming up from her stomach.  Of note, her cardiac enzymes through her hospital stay showed an elevated troponin, but normal CPK-MB.  Because of the elevated troponin but normal CPK-MB, she underwent nuclear stress testing which showed fixed defect in the apex extending from the anterior wall compatible of old infarct, but no evidence of reversibility or ischemia, EF 57%.  Two-D echocardiogram was also done which revealed overall normal LV function, EF 55-60% with hypokinesis in the anteroseptal myocardial consistent with prior myomectomy and severe focal basal hypertrophy.  There was mild MR with a stable mitral valve prosthesis and mild aortic valve insufficiency.  She had an unsuccessful hospital course.  Of note, it appeared on admission that she was having some atrial fibrillation with frequent ventricular ectopy.  Actually, her initial EKG done at 1944 p.m. on December 19, 2010, showed sinus rhythm with frequent PVCs, but then on the 15th it appeared as though she might have had some short runs of atrial fibrillation, but otherwise was maintaining sinus rhythm to sinus bradycardia.  It was felt because of the patient's underlying dementia for which she takes Namenda and the fact that she lives at home and manages her own medication and her frail state that at this time she was felt high risk for being placed on Coumadin.  I would like to get an outpatient LifeWatch  monitor for 4 weeks to see how much atrial fibrillation she is actually having.  Also, I would like to get her daughter in to discuss if we feel that Coumadin is indicated to see if her daughter could possibly help her out with her medications because of her dementia.  I am concerned that with her dementia she may get her Coumadin pills make stop and could potentially either be subtherapeutic or supratherapeutic on her Coumadin thereby increased her risk of having a cerebral bleed.  She has an appointment to see me tomorrow, Wednesday and I recommended that she keep that appointment and bring her daughter along, so we can discuss further treatment of her episode of atrial fibrillation that was seen in the hospital and whether or not she would be an adequate Coumadin candidate long term.  She was subsequently discharged home in stable condition after D-dimer was ordered which is pending at the time of this dictation.  DISCHARGE MEDICATIONS: 1. Lasix 20 mg daily.  This is new for her and her hydrochlorothiazide     was stopped. 2. Amlodipine 10 mg daily. 3. Aspirin 81 mg daily. 4. Coricidin 1 tablet as needed. 5. Dexilant 60 mg daily. 6. Dorzolamide ophthalmic eye drops 1 drop both eyes twice daily. 7. Flonase 2 sprays nasally as needed. 8. Imodium 125 mg daily. 9. Iron 66 mg daily. 10.Lisinopril 20 mg daily. 11.Lorazepam 0.5 mg 2 tablets at bedtime. 12.Lovastatin 20 mg daily. 13.Mucinex 600 mg 1 tablet as needed. 14.Namenda 10 mg b.i.d. 15.Ranitidine 150 mg daily. 16.Extra Strength Tylenol p.r.n.  She will follow up with me at her regular appointment tomorrow, December 23, 2010, and then she will also be seen again in 2 weeks with Cristopher Peru, my nurse practitioner on December 30, 2010, at 10:15.  Again, her discharge labs showed a white cell count of 5.5, hemoglobin 11.3, hematocrit 33.2, and platelet count 187.  TSH 2.210.  Sodium 139, potassium 3.9, chloride 105, bicarb 28, BUN  10, creatinine 0.96, and glucose 89.  CPKs were 114 with MB 1.9, 132 with MB of 3.0, 123 with MB of 3.9 and troponins were less than 0.3, 0.65, 0.4 and BNP was 1683.  ADDENDUM:  We will refer to her primary MD for further workup of her anemia.  I spent a total of 45 minutes in planning this patient's discharge including writing her discharge note, dictating her discharge summary, preparing her discharge medication list and medications.     Armanda Magic, M.D.     TT/MEDQ  D:  12/22/2010  T:  12/23/2010  Job:  [id number]  Electronically Signed by Armanda Magic M.D. on 12/24/2010 09:17:59 AM

## 2010-12-30 NOTE — H&P (Signed)
Jasmine [name], Jasmine [name] NO.:  [id number]  MEDICAL RECORD NO.:  [id number]  LOCATION:  3704                         FACILITY:  [facility]  PHYSICIAN:  Harlon Flor, MD   DATE OF BIRTH:  06/12/1924  DATE OF ADMISSION:  12/20/2010 DATE OF DISCHARGE:                             HISTORY & PHYSICAL   ADMISSION DIAGNOSES: 1. Chest pain. 2. Fatigue.  PRIMARY CARE PHYSICIAN:  Jethro Bastos, MD  CARDIOLOGIST:  Armanda Magic, MD  CHIEF COMPLAINT:  Chest pain.  HISTORY OF PRESENT ILLNESS:  Jasmine [name] is a pleasant 77 year old African female with hypertension, hypertrophic obstructive cardiomyopathy, and severe mitral regurgitation, status post septal myomectomy and mitral valve replacement with pericardial valve in 2007, who presents to the hospital tonight with chest pain and fatigue.  She has had intermittent chest pain for the last week and that did not appear to be associated with exertion.  She does have pain at rest.  It is not associated with shortness of breath, nausea, or diaphoresis.  She has also noted episodes of fatigue.  She has not noticed what her heart rate has been when she is fatigued.  She also has occasional palpitations.  She described the pain as feeling like a gas bubbles coming from her stomach.  She is pain free and her first set of cardiac markers is negative.  PAST MEDICAL HISTORY: 1. Idiopathic hypertrophic subaortic stenosis with severe mitral     regurgitation, status post septal myomectomy and mitral valve     replacement with a 27-mm Biocor pericardial valve in 2007. 2. Nonobstructive coronary artery disease by left heart     catheterization in 2007. 3. Gastroesophageal reflux disease. 4. Hypertension. 5. Left bundle-branch block. 6. Asthma. 7. Diverticulitis, status post sigmoid colon resection.  HOME MEDICATIONS: 1. Namenda 10 mg b.i.d. 2. Aspirin 81 mg daily. 3. Iron 66 mg 1 tablet daily. 4. Tylenol as needed. 5.  Mucinex 600 mg as needed. 6. Dexilant 60 mg daily. 7. Imodium as needed. 8. Dorzolamide ophthalmic solution 1 drop b.i.d. in both eyes. 9. Ranitidine 150 mg daily. 10.Lorazepam 0.5 mg 2 tablets at bedtime as needed. 11.Flonase 2 sprays as needed. 12.Lovastatin 20 mg every morning. 13.Lisinopril 20 mg daily. 14.Hydrochlorothiazide 25 mg daily. 15.Amlodipine 10 mg daily.  ALLERGIES:  No known drug allergies.  FAMILY HISTORY:  No early coronary artery disease.  SOCIAL HISTORY:  She lives at home with her daughter.  She does not drink alcohol and she does not smoke.  REVIEW OF SYSTEMS:  A full review of systems was obtained and is negative except as stated in the HPI.  PHYSICAL EXAMINATION:  VITAL SIGNS:  Blood pressure 167/72, pulse 66, respirations 19, temperature 98.5. GENERAL:  No acute stress. HEENT:  Extraocular movements intact.  Oropharynx is benign.  Nonicteric sclerae. NECK:  Supple. CARDIOVASCULAR:  Regular rate and rhythm.  Frequent PVCs.  No murmurs. Jugular venous pressure is normal. LUNGS:  Clear to auscultation bilaterally. ABDOMEN:  Soft, nontender, nondistended. EXTREMITIES:  There is no clubbing, cyanosis, or edema.  Pulses are intact throughout. NEURO:  Grossly afocal.  She is alert and oriented x3 and she moves all extremities well. SKIN:  There were no rashes. LYMPH:  No lymphadenopathy.  EKG shows normal sinus rhythm with left bundle-branch block and frequent PACs as well as PVCs.  Her hemoglobin is 13, platelets are 226.  Her BUN is 13 and creatinine 0.98.  Her troponin is undetectable and her CK-MB is 1.9.  Her chest x-ray is clear.  There is no evidence of volume overload.  ASSESSMENT/PLAN:  Jasmine [name] is an 77 year old African female with hypertrophic obstructive cardiomyopathy and severe mitral regurgitation, status post a septal myomectomy and mitral valve replacement with a 27- mm Biocor pericardial valve in 2007, who presents with chest pain  and fatigue.  Her chest pain is somewhat atypical for coronary artery disease, but her fatigue is concerning for potential symptomatic bradycardia given her conduction disease on EKG.  She has not had any syncope. 1. Chest pain:  We will rule out myocardial infarction with serial     cardiac markers and EKG.  We will increase her aspirin to 325 and     put her on heparin drip in the meantime.  We will check a 2-D     echocardiogram with Doppler tomorrow. 2. Fatigue:  We will check a TSH and monitor on telemetry for evidence     of symptomatic bradycardia.  She may benefit from an event monitor     as an outpatient if she does not show any problems while in the     hospital. 3. Hypertension:  We will continue her home antihypertensives.     Harlon Flor, MD     MMB/MEDQ  D:  12/20/2010  T:  12/20/2010  Job:  [id number]  cc:   Armanda Magic, M.D. Jethro Bastos, M.D.  Electronically Signed by Meridee Score MD on 12/30/2010 08:17:11 PM

## 2011-03-29 IMAGING — CT CT ABD-PELV W/O CM
2 of 4 series · 17 of 46 positions shown, 19 images · non-contrast
Comparison: CT abdomen pelvis of 04/28/2004

CLINICAL DATA: Abdominal pain, hematuria, history kidney stones

CT ABDOMEN AND PELVIS WITHOUT CONTRAST
TECHNIQUE: Multidetector CT imaging of the abdomen and pelvis was
performed following the standard protocol without intravenous
contrast.

[Series 2: abd/pelvis without · axial · non-contrast · 0.70mm/px · z∈[-298,+32]mm · 14 of 73 slices shown, 16 images]
[im 4/73  soft-tissue]
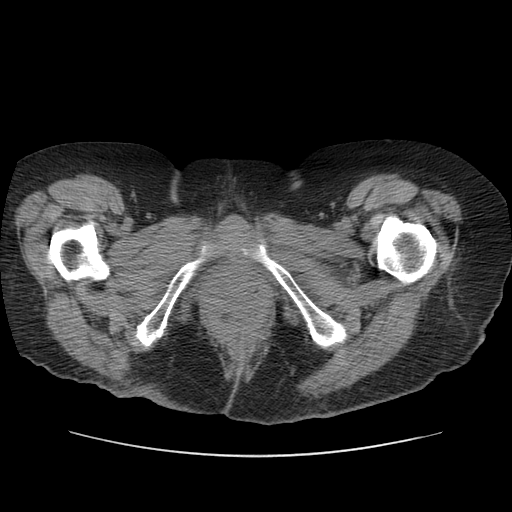
[im 4/73  bone]
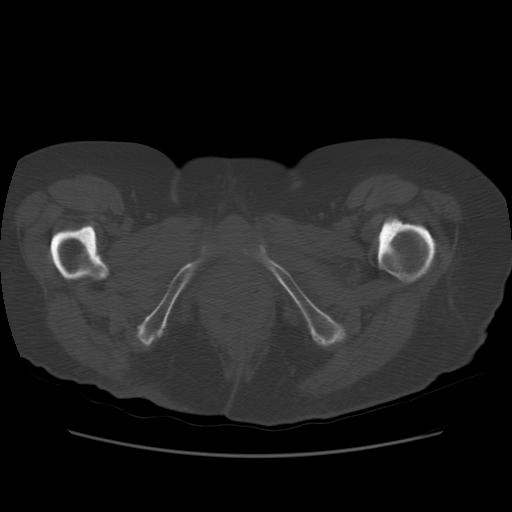
[im 10/73  soft-tissue]
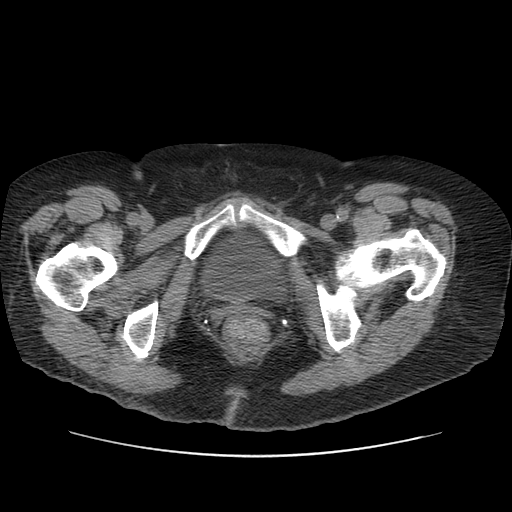
[im 16/73  soft-tissue]
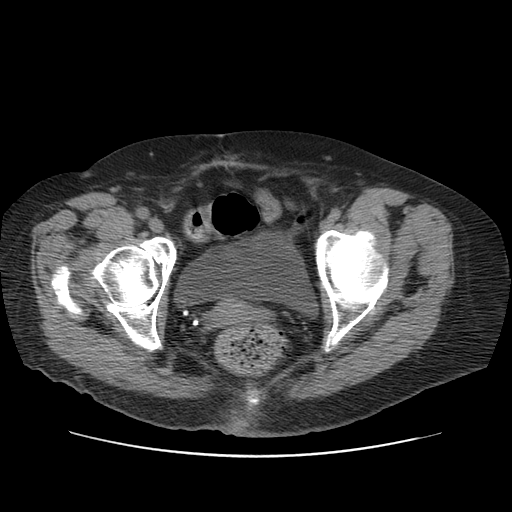
[im 19/73  soft-tissue]
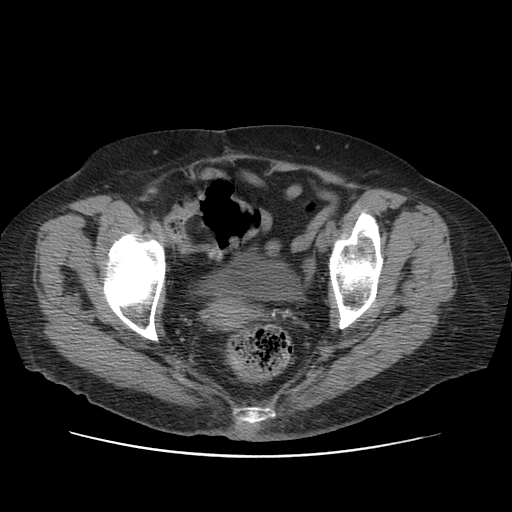
[im 25/73  soft-tissue]
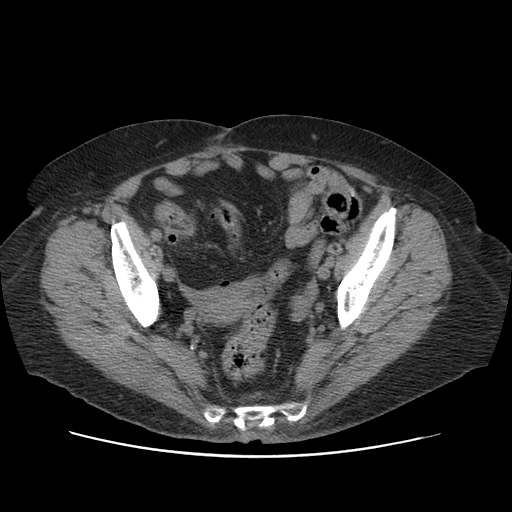
[im 31/73  soft-tissue]
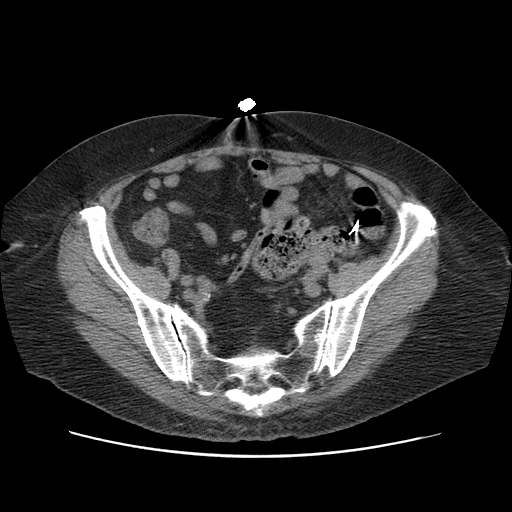
[im 34/73  soft-tissue]
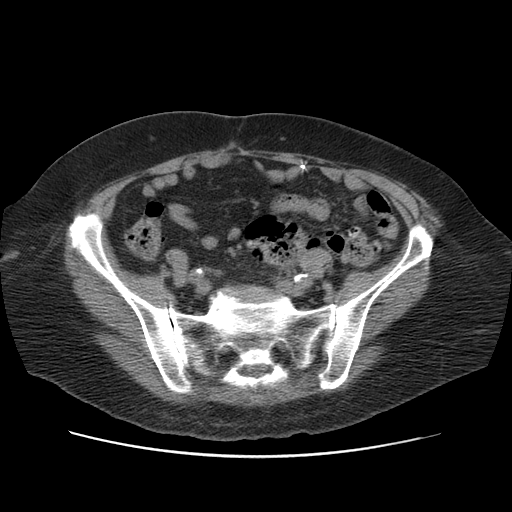
[im 40/73  soft-tissue]
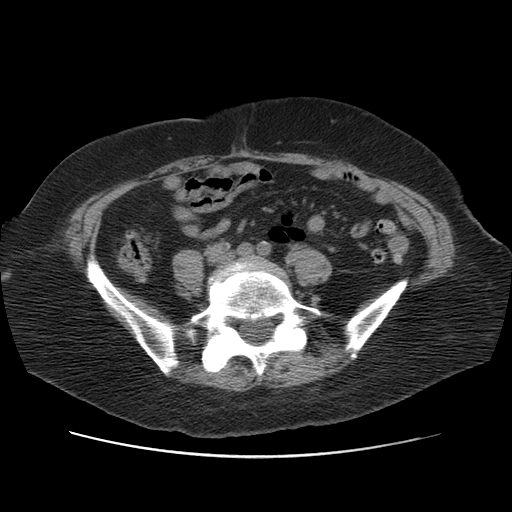
[im 43/73  soft-tissue]
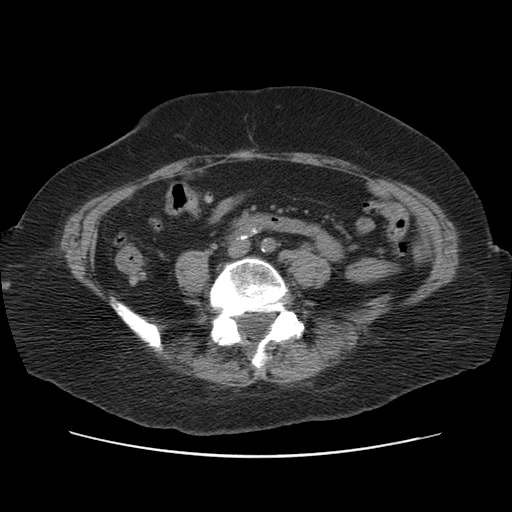
[im 43/73  bone]
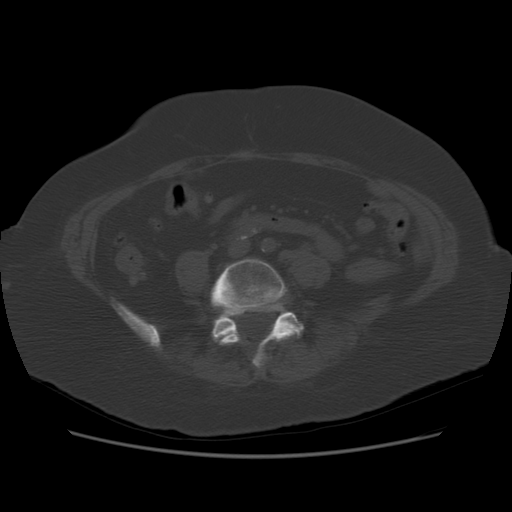
[im 49/73  soft-tissue]
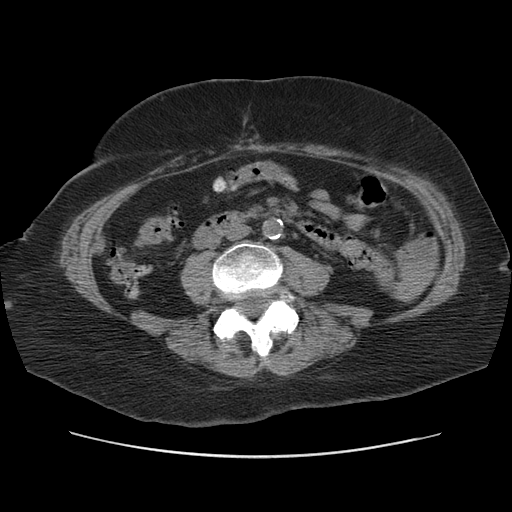
[im 55/73  soft-tissue]
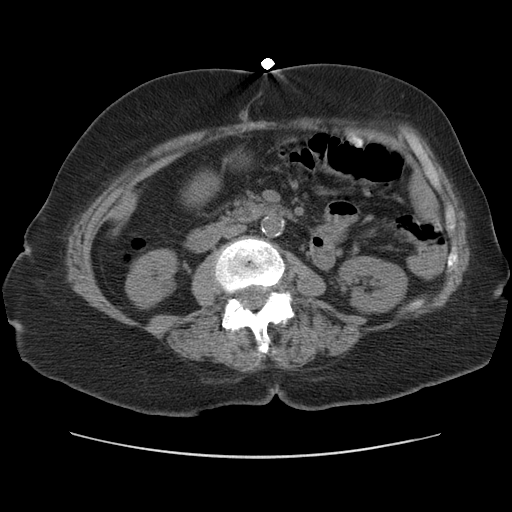
[im 58/73  soft-tissue]
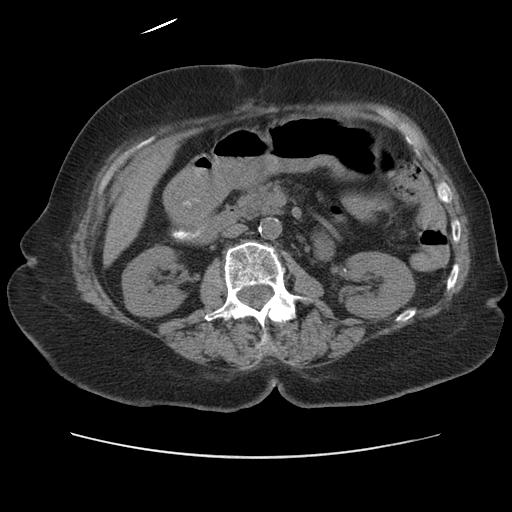
[im 64/73  soft-tissue]
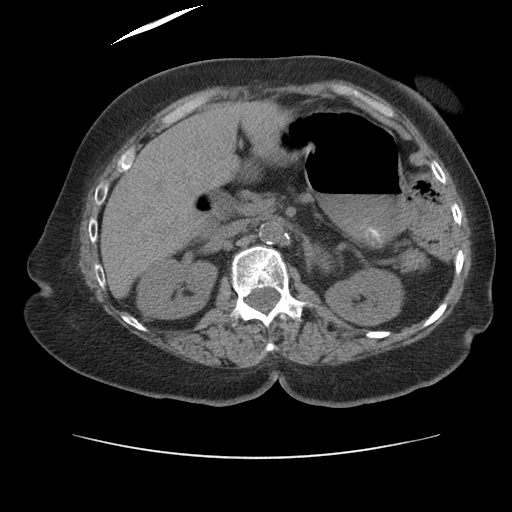
[im 70/73  soft-tissue]
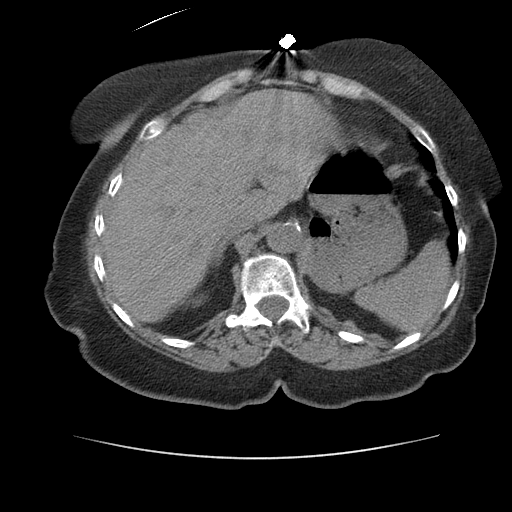

[Series 400: cor · coronal · 0.72mm/px · 3 of 107 slices shown]
[im 36/107  soft-tissue]
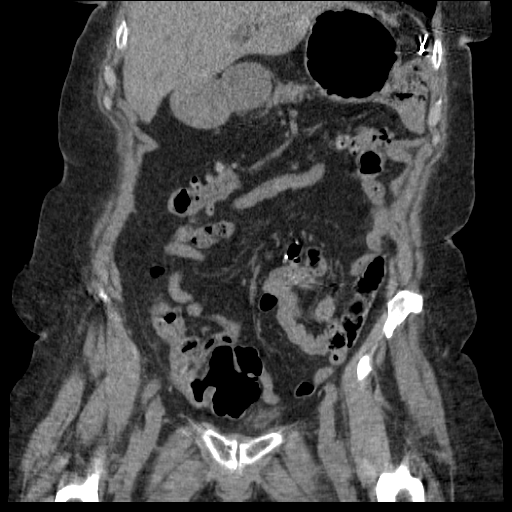
[im 48/107  soft-tissue]
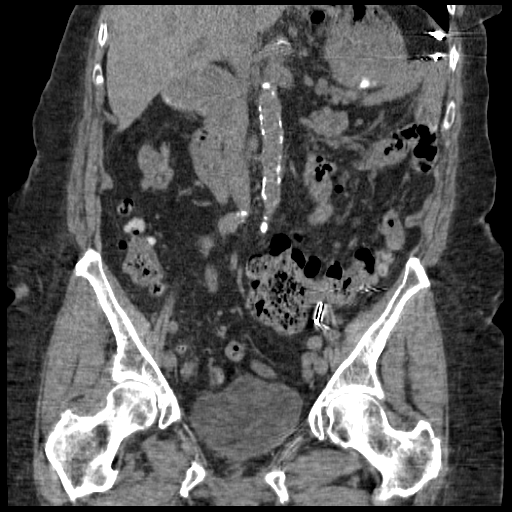
[im 59/107  soft-tissue]
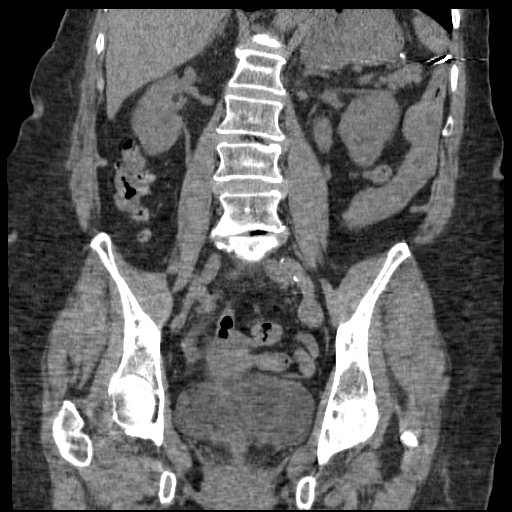

[17 of 46 positions shown; findings below may reference images not displayed]

FINDINGS: The lung bases are clear.  The liver is unremarkable in
the unenhanced state.  The gallbladder appears to have been
resected.  The pancreas is normal in size and the pancreatic duct
is not dilated.  The adrenal glands and spleen are unremarkable.
The stomach is fluid distended and unremarkable.  A single
renovascular calcification is noted near the left hilum.  However
no definite renal calculi are seen and there is no evidence of
hydronephrosis.  A small low attenuation structures noted in the
lower pole of the right kidney medially difficult to assess on this
unenhanced study.  The ureters are normal in caliber.  The
abdominal aorta appears normal.

The distal ureters are normal in caliber and no distal ureteral
calculi are seen.  The urinary bladder is unremarkable.  The uterus
is normal in size.  No adnexal lesion is seen.  No fluid is noted
within the pelvis.  Multiple colonic diverticula are scattered
throughout the entire colon.  Degenerative changes are noted in the
lower lumbar spine with degenerative disc disease particularly at
L4-5 level.
IMPRESSION: 1.  No renal calculi.  No hydronephrosis.
2.  The urinary bladder is not optimally distended no significant
abnormality is seen.
3.  Diffuse colonic diverticula.
4.  Single small low attenuation lesion in the lower pole of the
right kidney most likely cyst, but difficult to assess on this
unenhanced exam.

## 2011-10-05 ENCOUNTER — Encounter (HOSPITAL_COMMUNITY): Payer: Self-pay | Admitting: General Practice

## 2011-10-05 ENCOUNTER — Inpatient Hospital Stay (HOSPITAL_COMMUNITY): Payer: Medicare HMO

## 2011-10-05 ENCOUNTER — Inpatient Hospital Stay (HOSPITAL_COMMUNITY)
Admission: AD | Admit: 2011-10-05 | Discharge: 2011-10-08 | DRG: 244 | Disposition: A | Discharge status: Home / Self Care | Payer: Medicare HMO | Source: Ambulatory Visit | Attending: Cardiology | Admitting: Cardiology

## 2011-10-05 ENCOUNTER — Other Ambulatory Visit: Payer: Self-pay | Admitting: Cardiology

## 2011-10-05 ENCOUNTER — Encounter: Payer: Self-pay | Admitting: Cardiology

## 2011-10-05 DIAGNOSIS — E7800 Pure hypercholesterolemia, unspecified: Secondary | ICD-10-CM | POA: Diagnosis present

## 2011-10-05 DIAGNOSIS — Z9849 Cataract extraction status, unspecified eye: Secondary | ICD-10-CM

## 2011-10-05 DIAGNOSIS — I2510 Atherosclerotic heart disease of native coronary artery without angina pectoris: Secondary | ICD-10-CM | POA: Diagnosis present

## 2011-10-05 DIAGNOSIS — I4949 Other premature depolarization: Secondary | ICD-10-CM | POA: Diagnosis present

## 2011-10-05 DIAGNOSIS — I4891 Unspecified atrial fibrillation: Secondary | ICD-10-CM | POA: Diagnosis present

## 2011-10-05 DIAGNOSIS — R001 Bradycardia, unspecified: Secondary | ICD-10-CM | POA: Diagnosis present

## 2011-10-05 DIAGNOSIS — I519 Heart disease, unspecified: Secondary | ICD-10-CM | POA: Diagnosis present

## 2011-10-05 DIAGNOSIS — L259 Unspecified contact dermatitis, unspecified cause: Secondary | ICD-10-CM | POA: Diagnosis present

## 2011-10-05 DIAGNOSIS — I359 Nonrheumatic aortic valve disorder, unspecified: Secondary | ICD-10-CM | POA: Diagnosis present

## 2011-10-05 DIAGNOSIS — I129 Hypertensive chronic kidney disease with stage 1 through stage 4 chronic kidney disease, or unspecified chronic kidney disease: Secondary | ICD-10-CM | POA: Diagnosis present

## 2011-10-05 DIAGNOSIS — I4892 Unspecified atrial flutter: Secondary | ICD-10-CM | POA: Diagnosis present

## 2011-10-05 DIAGNOSIS — I4510 Unspecified right bundle-branch block: Secondary | ICD-10-CM

## 2011-10-05 DIAGNOSIS — Z961 Presence of intraocular lens: Secondary | ICD-10-CM

## 2011-10-05 DIAGNOSIS — N183 Chronic kidney disease, stage 3 unspecified: Secondary | ICD-10-CM | POA: Diagnosis present

## 2011-10-05 DIAGNOSIS — Z952 Presence of prosthetic heart valve: Secondary | ICD-10-CM

## 2011-10-05 DIAGNOSIS — K219 Gastro-esophageal reflux disease without esophagitis: Secondary | ICD-10-CM | POA: Diagnosis present

## 2011-10-05 DIAGNOSIS — I498 Other specified cardiac arrhythmias: Principal | ICD-10-CM | POA: Diagnosis present

## 2011-10-05 DIAGNOSIS — I447 Left bundle-branch block, unspecified: Secondary | ICD-10-CM | POA: Diagnosis present

## 2011-10-05 DIAGNOSIS — H409 Unspecified glaucoma: Secondary | ICD-10-CM | POA: Diagnosis present

## 2011-10-05 DIAGNOSIS — G4700 Insomnia, unspecified: Secondary | ICD-10-CM | POA: Diagnosis present

## 2011-10-05 DIAGNOSIS — I2789 Other specified pulmonary heart diseases: Secondary | ICD-10-CM | POA: Diagnosis present

## 2011-10-05 HISTORY — DX: Chronic kidney disease, stage 3 (moderate): N18.3

## 2011-10-05 HISTORY — DX: Nonrheumatic mitral (valve) insufficiency: I34.0

## 2011-10-05 HISTORY — DX: Chronic kidney disease, stage 3 unspecified: N18.30

## 2011-10-05 HISTORY — DX: Atherosclerotic heart disease of native coronary artery without angina pectoris: I25.10

## 2011-10-05 LAB — CBC
HCT: 38.9 % (ref 36.0–46.0)
Hemoglobin: 13 g/dL (ref 12.0–15.0)
MCH: 29.7 pg (ref 26.0–34.0)
RBC: 4.38 MIL/uL (ref 3.87–5.11)

## 2011-10-05 LAB — COMPREHENSIVE METABOLIC PANEL
ALT: 18 U/L (ref 0–35)
Alkaline Phosphatase: 54 U/L (ref 39–117)
BUN: 14 mg/dL (ref 6–23)
CO2: 27 mEq/L (ref 19–32)
Calcium: 9.2 mg/dL (ref 8.4–10.5)
GFR calc Af Amer: 52 mL/min — ABNORMAL LOW (ref >90)
GFR calc non Af Amer: 45 mL/min — ABNORMAL LOW (ref >90)
Glucose, Bld: 111 mg/dL — ABNORMAL HIGH (ref 70–99)
Potassium: 4.4 mEq/L (ref 3.5–5.1)
Sodium: 142 mEq/L (ref 135–145)

## 2011-10-05 LAB — PHOSPHORUS: Phosphorus: 3.3 mg/dL (ref 2.3–4.6)

## 2011-10-05 LAB — MAGNESIUM: Magnesium: 2.6 mg/dL — ABNORMAL HIGH (ref 1.5–2.5)

## 2011-10-05 LAB — DIFFERENTIAL
Basophils Absolute: 0 10*3/uL (ref 0.0–0.1)
Eosinophils Absolute: 0.2 10*3/uL (ref 0.0–0.7)
Eosinophils Relative: 3 % (ref 0–5)
Neutrophils Relative %: 66 % (ref 43–77)

## 2011-10-05 MED ORDER — POLYSACCHARIDE IRON COMPLEX 150 MG PO CAPS
150.0000 mg | ORAL_CAPSULE | Freq: Every day | ORAL | Status: DC
  Administered 2011-10-06 – 2011-10-08 (×2): 150 mg via ORAL
  Filled 2011-10-05 (×4): qty 1

## 2011-10-05 MED ORDER — FUROSEMIDE 20 MG PO TABS
20.0000 mg | ORAL_TABLET | Freq: Every day | ORAL | Status: DC
  Administered 2011-10-05 – 2011-10-08 (×3): 20 mg via ORAL
  Filled 2011-10-05 (×4): qty 1

## 2011-10-05 MED ORDER — DORZOLAMIDE HCL 2 % OP SOLN
1.0000 [drp] | Freq: Two times a day (BID) | OPHTHALMIC | Status: DC
  Administered 2011-10-05 – 2011-10-08 (×6): 1 [drp] via OPHTHALMIC
  Filled 2011-10-05: qty 10

## 2011-10-05 MED ORDER — FLUTICASONE PROPIONATE 50 MCG/ACT NA SUSP
2.0000 | Freq: Every day | NASAL | Status: DC
  Administered 2011-10-05 – 2011-10-08 (×4): 2 via NASAL
  Filled 2011-10-05: qty 16

## 2011-10-05 MED ORDER — SODIUM CHLORIDE 0.9 % IJ SOLN
3.0000 mL | Freq: Two times a day (BID) | INTRAMUSCULAR | Status: DC
  Administered 2011-10-06 – 2011-10-07 (×2): 3 mL via INTRAVENOUS

## 2011-10-05 MED ORDER — PANTOPRAZOLE SODIUM 40 MG PO TBEC
40.0000 mg | DELAYED_RELEASE_TABLET | Freq: Every day | ORAL | Status: DC
  Administered 2011-10-06 – 2011-10-08 (×2): 40 mg via ORAL
  Filled 2011-10-05 (×2): qty 1

## 2011-10-05 MED ORDER — AMLODIPINE BESYLATE 10 MG PO TABS
10.0000 mg | ORAL_TABLET | Freq: Every day | ORAL | Status: DC
  Administered 2011-10-05 – 2011-10-08 (×4): 10 mg via ORAL
  Filled 2011-10-05 (×4): qty 1

## 2011-10-05 MED ORDER — ALBUTEROL SULFATE HFA 108 (90 BASE) MCG/ACT IN AERS
2.0000 | INHALATION_SPRAY | Freq: Four times a day (QID) | RESPIRATORY_TRACT | Status: DC | PRN
  Filled 2011-10-05: qty 6.7

## 2011-10-05 MED ORDER — LORATADINE 10 MG PO TABS
10.0000 mg | ORAL_TABLET | Freq: Every day | ORAL | Status: DC
  Administered 2011-10-05 – 2011-10-08 (×3): 10 mg via ORAL
  Filled 2011-10-05 (×4): qty 1

## 2011-10-05 MED ORDER — SODIUM CHLORIDE 0.9 % IJ SOLN: 3.0000 mL | INTRAMUSCULAR | Status: DC | PRN

## 2011-10-05 MED ORDER — FE BISGLYCINATE-FE POLYSAC 40-20 MG PO CAPS: 1.0000 | ORAL_CAPSULE | Freq: Every day | ORAL | Status: DC

## 2011-10-05 MED ORDER — ACETAMINOPHEN 500 MG PO TABS
500.0000 mg | ORAL_TABLET | Freq: Four times a day (QID) | ORAL | Status: DC | PRN
  Administered 2011-10-06: 500 mg via ORAL
  Filled 2011-10-05: qty 1

## 2011-10-05 MED ORDER — SODIUM CHLORIDE 0.9 % IV SOLN: 250.0000 mL | INTRAVENOUS | Status: DC | PRN

## 2011-10-05 MED ORDER — LISINOPRIL 20 MG PO TABS
20.0000 mg | ORAL_TABLET | Freq: Every day | ORAL | Status: DC
  Administered 2011-10-05 – 2011-10-08 (×4): 20 mg via ORAL
  Filled 2011-10-05 (×4): qty 1

## 2011-10-05 MED ORDER — SODIUM CHLORIDE 0.9 % IJ SOLN
3.0000 mL | Freq: Two times a day (BID) | INTRAMUSCULAR | Status: DC
  Administered 2011-10-05: 3 mL via INTRAVENOUS

## 2011-10-05 MED ORDER — MEMANTINE HCL 10 MG PO TABS
10.0000 mg | ORAL_TABLET | Freq: Two times a day (BID) | ORAL | Status: DC
  Administered 2011-10-05 – 2011-10-08 (×6): 10 mg via ORAL
  Filled 2011-10-05 (×7): qty 1

## 2011-10-05 MED ORDER — LORAZEPAM 1 MG PO TABS
1.0000 mg | ORAL_TABLET | Freq: Every evening | ORAL | Status: DC | PRN
  Administered 2011-10-05 – 2011-10-07 (×3): 1 mg via ORAL
  Filled 2011-10-05 (×3): qty 1

## 2011-10-05 MED ORDER — SIMVASTATIN 10 MG PO TABS
10.0000 mg | ORAL_TABLET | Freq: Every day | ORAL | Status: DC
  Administered 2011-10-05 – 2011-10-08 (×4): 10 mg via ORAL
  Filled 2011-10-05 (×4): qty 1

## 2011-10-05 MED ORDER — GUAIFENESIN ER 600 MG PO TB12
600.0000 mg | ORAL_TABLET | Freq: Two times a day (BID) | ORAL | Status: DC | PRN
  Filled 2011-10-05: qty 1

## 2011-10-05 MED ORDER — POTASSIUM CHLORIDE ER 10 MEQ PO TBCR
10.0000 meq | EXTENDED_RELEASE_TABLET | Freq: Every day | ORAL | Status: DC
  Administered 2011-10-05 – 2011-10-08 (×4): 10 meq via ORAL
  Filled 2011-10-05 (×4): qty 1

## 2011-10-05 NOTE — H&P (Signed)
Office Visit     Patient: Jasmine, [name] Provider: Armanda Magic, MD  DOB: [birth year] Age: 78 Y Sex: Female Date: 10/05/2011  Phone: [phone]   Address: [address] [place], [id number]  Pcp: DONNA GATES       Subjective:     CC:    1. FOLLOW UP & SEE JEREMY.        HPI:  General:  Jasmine [name] is a 78 yo female with a hx of nonobstrucitve CAD, severe MR with MV replacement and septal myomectomy in 2007, HOCM, and Atrial fibrillation. 7/12 . Nuclear stress test was performed with fixed defect and no reversible ischemia, EF 57%., she was noted during that admission to have diastolic dysfunction. She was seen 07/05/11 after swelling in her feet and SOB and was given increase Lasix 20 mg BID for 3 days at office visit 06/11/11 with improvement in symptoms then the symptoms returned and after BID dosing every day she lost 7 lbs and was feeling weak, and therefore was placed on Lasix 20 mg qd except BID dosing 2 days per week. Today she says she feels terrible! She states that her shortness of breath has gotten worse. SHe says that she has been having thumping in her chest and her head all the time and makes her feel bad. She has had some dizziness but no syncope and is very fatigued..  Patient denies chest pain, syncope, palpitations nor PND.       ROS:  See HPI, A twelve system review was perfomed at today's visit. For pertinent positives and negatives see HPI.       Medical History: Allergies/asthma, Hypertension, Reflux esophagitis, Insomnia, Eczema, Glaucoma, mild TR with mild to moderate pulmonary hypertension, normal LVF with mild LV hypertrophy, mild MR by recent echo, s/p mitral valve replacement due to severe MR 2007, Mild to moderate aortic insufficiency, diastolic dysfunction, 7/12 admission with [id number], Idiopathic hypertrophic subaortic stenosis s/p ventricular septal myomectomy, Paroxysmal atrial tachycardia, Bradycardia, CKD- stage 3.        Family History:  Father: deceased 37 yrs unknown Mother: deceased 55 yrs stroke, high blood pressure, diabetes Brother 1: alive 9 yrs high blood pressure, stroke        Social History:  General:  History of smoking  cigarettes: Former smoker Quit in year 1976 no Smoking.  no Alcohol.  Caffeine: yes, tea, coffee, one daily.  no Recreational drug use.  Exercise: yes, intermittent, walking.  Occupation: retired.  Marital Status: widowed. husband died 09-01-00.  Children: 1 daughter deceased, 2 sons living and 1 daughter living.  Seat belt use: yes.        Medications: Caltrate 600+D 600-400 MG-UNIT Tablet 1 tablet once a day, Dorzolamide HCl 2 % Solution 1 drop each eye twice a day, Imodium Multi-Symptom Relief 2-125 MG Tablet Chewable 1 capsule as needed, Dexilant 60 mg capsule 1 tablet every morning, Mucinex 600 MG Tablet Extended Release 12 Hour 1 tablet for chest congestion as needed, Tylenol Extra Strength 500 MG Tablet 2 tablets as needed, Iron 65mg  Tablet 1 tablet once a day, Antacid Anti-Gas Max Strength 400-400-40 MG/5ML Suspension 10 ml as needed , Passion Flower-Valerian 350mg  Capsule 1 capsule sometimes, Align Capsule 1 capsule as needed, ProAir HFA 108 (90 Base) MCG/ACT Aerosol Solution 2 puffs as needed every 4 hrs, Lorazepam 1 MG Tablet 1 tablet at bedtime if needed, Flonase 50 MCG/ACT Suspension 2 sprays Once a day, Namenda 10 MG Tablet 1 tablet twice a day, Amlodipine Besylate  10 mg Tablet 1 tablet once a day, Lisinopril 20 MG Tablet 1 tablet once a day, Lasix 20 MG Tablet 1 tablet once a day except monday and friday to take 2 tablets, Lovastatin 20 MG Tablet 1 tablet every evening, Coumadin 5 MG 5 MG Tablet per pharmD 5 mg qd except 2.5 mg MWF, Potassium Chloride CR 10 MEQ Tablet Extended Release 1 tablet once a day, Medication List reviewed and reconciled with the patient       Allergies: N.K.D.A.      Objective:     Vitals: Wt 153, Wt change 1.6 lb, Ht 65, BMI 25.46, Pulse sitting 32, BP  sitting 142/48.       Examination:  Cardiology, General:  GENERAL APPEARANCE: pleasant, NAD.  HEENT: unremarkable.  CAROTID UPSTROKE: normal, no bruit.  JVD: flat.  HEART SOUNDS: regular, normal S1, S2, no S3 or S4.  MURMUR: absent.  LUNGS: no rales or wheezes.  ABDOMEN: soft, non tender, positive bowel sounds, no masses felt.  EXTREMITIES: no leg edema.  PERIPHERAL PULSES: 2 plus bilateral.        Assessment:     Assessment:  1. Atrial fibrillation - 427.31 (Primary)  2. Benign hypertensive heart disease without heart failure - 402.10  3. Left Bundle Branch Block - 426.3  4. Mitral Regurgitation - 424.0  5. Hypertrophic obstructive cardiomyopathy - 425.1  6. Bradycardia - 427.89    Plan:     1. Atrial fibrillation Continue Coumadin 5 MG Tablet, 5 MG, per pharmD, Orally, 5 mg qd except 2.5 mg MWF .  Diagnostic Imaging:EKG bradycardia with junctional rhythm and bigeminal PVC's, RBBB, Jasmine [name] 10/05/2011 11:11:22 AM > Jasmine [name] M 10/05/2011 11:28:39 AM >       2. Benign hypertensive heart disease without heart failure Continue Amlodipine Besylate Tablet, 10 mg, 1 tablet, once a day ; Continue Lisinopril Tablet, 20 MG, 1 tablet, once a day .       3. Mitral Regurgitation Continue Lasix Tablet, 20 MG, 1 tablet, Orally, once a day except monday and friday to take 2 tablets .       4. Bradycardia  She is very bradycardic today with EKG showing junctional bradycardia with bigeminal PVC's. She has been in sinus rhythm on other EKGs over the past year. She is very symptomatic with increased SOB, dizziness and a thumping sensation in her chest and head. She is on no rate slowing drugs and I think at this time she will need PPM. I will admit to tele bed. I have spoken with Dr. Ladona Ridgel and he will see her in consultation.   Her INR today in our office is 3.0 so I will hold her coumadin       Immunizations:        Labs:        Procedure Codes: 16109 EKG I AND R        Preventive:         Follow Up: Admit level 3      Provider: Armanda Magic, MD  Patient: Jasmine, [name] DOB: [birth year] Date: 10/05/2011

## 2011-10-06 LAB — BASIC METABOLIC PANEL
BUN: 14 mg/dL (ref 6–23)
CO2: 25 mEq/L (ref 19–32)
Chloride: 109 mEq/L (ref 96–112)
Creatinine, Ser: 1.24 mg/dL — ABNORMAL HIGH (ref 0.50–1.10)
Glucose, Bld: 84 mg/dL (ref 70–99)

## 2011-10-06 MED ORDER — WARFARIN SODIUM 5 MG PO TABS
5.0000 mg | ORAL_TABLET | Freq: Once | ORAL | Status: AC
  Administered 2011-10-06: 5 mg via ORAL
  Filled 2011-10-06: qty 1

## 2011-10-06 MED ORDER — CHLORHEXIDINE GLUCONATE 4 % EX LIQD
60.0000 mL | Freq: Once | CUTANEOUS | Status: DC
  Filled 2011-10-06: qty 60

## 2011-10-06 MED ORDER — CHLORHEXIDINE GLUCONATE 4 % EX LIQD
60.0000 mL | Freq: Once | CUTANEOUS | Status: AC
  Administered 2011-10-06: 4 via TOPICAL
  Filled 2011-10-06: qty 60

## 2011-10-06 MED ORDER — SODIUM CHLORIDE 0.9 % IJ SOLN
3.0000 mL | Freq: Two times a day (BID) | INTRAMUSCULAR | Status: DC
  Administered 2011-10-06 (×2): 3 mL via INTRAVENOUS

## 2011-10-06 MED ORDER — CEFAZOLIN SODIUM 1-5 GM-% IV SOLN
1.0000 g | INTRAVENOUS | Status: DC
  Filled 2011-10-06: qty 50

## 2011-10-06 MED ORDER — SODIUM CHLORIDE 0.9 % IR SOLN
80.0000 mg | Status: DC
  Filled 2011-10-06: qty 2

## 2011-10-06 MED ORDER — WARFARIN - PHARMACIST DOSING INPATIENT
Freq: Every day | Status: DC
  Administered 2011-10-06: 18:00:00

## 2011-10-06 MED ORDER — SODIUM CHLORIDE 0.45 % IV SOLN
INTRAVENOUS | Status: DC
  Administered 2011-10-07: 06:00:00 via INTRAVENOUS

## 2011-10-06 MED ORDER — SODIUM CHLORIDE 0.9 % IV SOLN
250.0000 mL | INTRAVENOUS | Status: DC
  Administered 2011-10-07: 250 mL via INTRAVENOUS
  Administered 2011-10-07: 66 mL via INTRAVENOUS

## 2011-10-06 MED ORDER — SODIUM CHLORIDE 0.9 % IJ SOLN: 3.0000 mL | INTRAMUSCULAR | Status: DC | PRN

## 2011-10-06 NOTE — Progress Notes (Signed)
SUBJECTIVE:  Still has hard pounding in her head and chest  OBJECTIVE:   Vitals:   Filed Vitals:   10/06/11 0021 10/06/11 0450 10/06/11 0635 10/06/11 0937  BP: 160/58 157/49    Pulse:  28 57 60  Temp:  98.5 F (36.9 C)    TempSrc:  Oral    Resp:  17    Height:      Weight:      SpO2:  97%     I&O's:   Intake/Output Summary (Last 24 hours) at 10/06/11 1114 Last data filed at 10/06/11 0958  Gross per 24 hour  Intake   1323 ml  Output      0 ml  Net   1323 ml   TELEMETRY: Reviewed telemetry pt in junctional bradycardia with bigeminal PVC's     PHYSICAL EXAM General: Well developed, well nourished, in no acute distress Head: Eyes PERRLA, No xanthomas.   Normal cephalic and atramatic  Lungs:   Clear bilaterally to auscultation and percussion. Heart:   HRRR S1 S2 Pulses are 2+ & equal. Frequent ectopy Abdomen: Bowel sounds are positive, abdomen soft and non-tender without masses Extremities:   No clubbing, cyanosis or edema.  DP +1 Neuro: Alert and oriented X 3. Psych:  Good affect, responds appropriately   LABS: Basic Metabolic Panel:  Basename 10/06/11 0615 10/05/11 1336  NA 143 142  K 4.1 4.4  CL 109 106  CO2 25 27  GLUCOSE 84 111*  BUN 14 14  CREATININE 1.24* 1.10  CALCIUM 8.9 9.2  MG -- 2.6*  PHOS -- 3.3   Liver Function Tests:  Basename 10/05/11 1336  AST 22  ALT 18  ALKPHOS 54  BILITOT 0.4  PROT 7.4  ALBUMIN 3.8   No results found for this basename: LIPASE:2,AMYLASE:2 in the last 72 hours CBC:  Basename 10/05/11 1336  WBC 7.7  NEUTROABS 5.1  HGB 13.0  HCT 38.9  MCV 88.8  PLT 215   Thyroid Function Tests:  Basename 10/05/11 1336  TSH 2.036  T4TOTAL --  T3FREE --  THYROIDAB --   Anemia Panel: No results found for this basename: VITAMINB12,FOLATE,FERRITIN,TIBC,IRON,RETICCTPCT in the last 72 hours Coag Panel:   Lab Results  Component Value Date   INR 2.50* 10/06/2011   INR 2.62* 10/05/2011   INR 1.05 12/20/2010    RADIOLOGY: Dg  Chest 2 View  10/05/2011  *RADIOLOGY REPORT*  Clinical Data: Arrhythmia.  "Possible PPM".  Heart murmur.  CHEST - 2 VIEW  Comparison: 12/19/2010.  Findings: Lateral view degraded by patient arm position.  Numerous leads and wires project over the chest.  Midline trachea. Prior median sternotomy. No pleural effusion or pneumothorax. Clear lungs.  IMPRESSION: No acute cardiopulmonary disease.  Original Report Authenticated By: Consuello Bossier, M.D.      ASSESSMENT:  1.  Junctional Bradycardia 2.  Symptomatic bigeminal PVC's 3.  Severe MR s/p MVR 4.  HOCM s/p septal myomectomy 5.  PAT 6.  CKD stage 3 7.  HTN 8.  Diastolic dysfunction 9.  Systemic anticoagulation  PLAN:   1.  Restart coumadin per pharmacy to run INR around 2.5 2.  NPO now 3.  EP (Dr. Ladona Ridgel) to see today for possible pacer later today  Quintella Reichert, MD  10/06/2011  11:14 AM

## 2011-10-06 NOTE — Consult Note (Signed)
ELECTROPHYSIOLOGY CONSULT NOTE  Patient ID: Jasmine [name] MRN: [id number], DOB/AGE: 06-10-1924   Admit date: 10/05/2011 Date of Consult: 10/06/2011  Primary Physician: Hollice Espy, MD, MD Primary Cardiologist: Armanda Magic, MD Reason for Consultation: BRADYCARDIA  History of Present Illness Jasmine [name] is a pleasant 78 year old woman with a past medical history significant for severe MR s/p MVR with pericardial valve 2007, IHSS s/p septal myomectomy 2007, paroxysmal atrial fibrillation, LBBB, non-obstructive CAD s/p LHC 2007 and normal LV function who has been admitted with fatigue/weakness, shortness of breath, dizziness and palpitations, found to have bradycardia by 12-lead ECG during clinic visit at Dr. Norris Cross office. We have been asked to see her in consultation for EP recommendations. Jasmine [name] reports her symptoms have been gradually worsening over the last 2 months. She describes "thumping" palpitations accompanied by generalized weakness and bilateral arm "heaviness" and shortness of breath. She also reports dizziness and shortness of breath with exertion. She reports one episode of syncope with lying in bed early Sunday morning, ~3 AM. She was returning to bed after using the bathroom and upon lying down she noticed a sudden feeling of weakness, shortness of breath and "things going dark" prior to her loss of consciousness. This was not witnessed. She denies any injury or loss of bowel or bladder control and states she felt normal upon regaining consciousness. Her 12-lead ECG with rhythm report from Dr. Norris Cross office shows a junctional bradycardia with ventricular bigeminy. While here, telemetry shows sinus bradycardia with intermittent junctional bradycardia and ventricular bigeminy. She is not on any rate controlling medications. Her TSH level and electrolytes are within normal limits.   Past Medical History  Diagnosis Date  . Hypertension   . Heart murmur     Severe MR s/p MVR    . GERD (gastroesophageal reflux disease)   . Insomnia   . Eczema   . Glaucoma   . Diastolic dysfunction   . IHSS (idiopathic hypertrophic subaortic stenosis)     s/p septal myomectomy  . High cholesterol   . Atrial fibrillation and flutter     atrial fibrillation  . Paroxysmal atrial tachycardia   . Shortness of breath   . Coronary artery disease (non-obstructive by [facility] 2007)   . Blood transfusion   . Chronic kidney disease (CKD), stage III (moderate)   . Severe mitral regurgitation     Past Surgical History  Procedure Date  . Cardiac catheterization   . Cardiac valve replacement 2007    MVR (27 mm pericardial valve)  . Myomectomy 2007    septal  . Tonsillectomy   . Appendectomy   . Cholecystectomy   . Tubal ligation   . Hernia repair     "in my stomach"  . Cataract extraction w/ intraocular lens  implant, bilateral     Allergies/Intolerances No Known Allergies  Inpatient Medications . amLODipine  10 mg Oral Daily  . dorzolamide  1 drop Both Eyes BID  . fluticasone  2 spray Each Nare Daily  . furosemide  20 mg Oral Daily  . iron polysaccharides  150 mg Oral Q breakfast  . lisinopril  20 mg Oral Daily  . loratadine  10 mg Oral Daily  . memantine  10 mg Oral BID  . pantoprazole  40 mg Oral Q1200  . potassium chloride  10 mEq Oral Daily  . simvastatin  10 mg Oral q1800   Family History Positive for CVA, HTN   Social History . Marital Status: Widowed  Spouse Name: N/A    Number of Children: N/A  . Years of Education: N/A   Social History Main Topics  . Smoking status: Former Smoker    Types: Cigarettes  . Smokeless tobacco: Former Neurosurgeon    Types: Snuff   Comment: 10/05/11 "quit smoking & dipping snuff; been some years ago"  . Alcohol Use: Yes     10/05/11 "used to drink; quit a long time ago"  . Drug Use: No  . Sexually Active: No   Review of Systems General:  No chills, fever, night sweats or weight changes.  Cardiovascular:  No chest pain, dyspnea  on exertion, edema, orthopnea, palpitations, paroxysmal nocturnal dyspnea. Dermatological: No rash, lesions/masses Respiratory: No cough, dyspnea Urologic: No hematuria, dysuria Abdominal:   No nausea, vomiting, diarrhea, bright red blood per rectum, melena, or hematemesis Neurologic:  No visual changes, wkns, changes in mental status. All other systems reviewed and are otherwise negative except as noted above.  Physical Exam Blood pressure 157/49, pulse 60, temperature 98.5 F (36.9 C), temperature source Oral, resp. rate 17, height 5\' 9"  (1.753 m), weight 146 lb 9.7 oz (66.5 kg), SpO2 97.00%.  General: Well developed, well appearing 78 year old female who is lying comfortably in no acute distress. HEENT: Normocephalic, atraumatic. EOMs intact. Sclera nonicteric. Oropharynx clear.  Neck: Supple without bruits. No JVD. Lungs:  Respirations regular and unlabored, CTA bilaterally. No wheezes, rales or rhonchi. Heart: Bradycardic and irregular due to ectopy. S1, S2 present. No murmurs, rub, S3 or S4. Abdomen: Soft, non-tender, non-distended. BS present x 4 quadrants. No hepatosplenomegaly.  Extremities: No clubbing, cyanosis or edema. DP/PT/Radials 2+ and equal bilaterally. Psych: Normal affect. Neuro: Alert and oriented X 3. Moves all extremities spontaneously.   Labs Lab Results  Component Value Date   WBC 7.7 10/05/2011   HGB 13.0 10/05/2011   HCT 38.9 10/05/2011   MCV 88.8 10/05/2011   PLT 215 10/05/2011    Lab 10/06/11 0615 10/05/11 1336  NA 143 --  K 4.1 --  CL 109 --  CO2 25 --  BUN 14 --  CREATININE 1.24* --  CALCIUM 8.9 --  PROT -- 7.4  BILITOT -- 0.4  ALKPHOS -- 54  ALT -- 18  AST -- 22  GLUCOSE 84 --   INR 2.5  Radiology/Studies Dg Chest 2 View  10/05/2011  *RADIOLOGY REPORT*  Clinical Data: Arrhythmia.  "Possible PPM".  Heart murmur.  CHEST - 2 VIEW  Comparison: 12/19/2010.  Findings: Lateral view degraded by patient arm position.  Numerous leads and wires project  over the chest.  Midline trachea. Prior median sternotomy. No pleural effusion or pneumothorax. Clear lungs.  IMPRESSION: No acute cardiopulmonary disease.  Original Report Authenticated By: Consuello Bossier, M.D.   12-lead ECG with rhythm report from Dr. Norris Cross office: junctional bradycardia with ventricular bigeminy; wide QRS  Telemetry: sinus bradycardia with intermittent junctional bradycardia and ventricular bigeminy  Assessment and Plan 1.  Symptomatic bradycardia -  Jasmine [name] has fatigue/weakness, palpitations, exertional dyspnea and dizziness with documented junctional bradycardia and ventricular bigeminy. She is not on any rate controlling medications. Her TSH and electrolytes are within normal limits. She has normal LV function and history of non-ob CAD. We recommend permanent pacemaker implantation for treatment of symptomatic bradycardia. The procedure involved, including risks and benefits, have been reviewed in detail. These risks include, but are not limited to, bleeding, infection, pericardial perforation, lead dislodgement or pneumothorax. Jasmine [name] expressed verbal understanding of the procedure, including its  risks and benefits, and agrees to proceed.  The patient was seen, examined and plan of care formulated with Dr. Lewayne Bunting. Signed, EDMISTEN, Nehemiah Settle O., PA-C 10/06/2011, 12:52 PM   Pt seen and examined.  She has history of progressive exercise intolerance over the last few months and HR previously noted as low have been significantly lower when assessed in the last few days now in the 30s.  Tracings are as noted above with junctional rhythm and bigeminy for a functional rate of about 25-30.  She also has nonsustained VT in setting of valvular heart disease and prior MVR, hx of HCM with prior myectomy and normal LV function-- 57% nuclear study 7/12  HR recorded in the 60s at that time  Have reviewed the role of pacemaker again and agree it is indicated for symptomatic  bradycardia    The benefits and risks were reviewed including but not limited to death,  perforation, infection, lead dislodgement and device malfunction.  The patient understands agrees and is willing to proceed.

## 2011-10-06 NOTE — Progress Notes (Addendum)
ANTICOAGULATION CONSULT NOTE - Initial Consult  Pharmacy Consult for coumadin Indication: atrial fibrillation  No Known Allergies  Patient Measurements: Height: 5\' 9"  (175.3 cm) Weight: 146 lb 9.7 oz (66.5 kg) IBW/kg (Calculated) : 66.2  Heparin Dosing Weight: 66.5 kg  Vital Signs: Temp: 98.5 F (36.9 C) (05/01 0450) Temp src: Oral (05/01 0450) BP: 157/49 mmHg (05/01 0450) Pulse Rate: 60  (05/01 0937)  Labs:  Basename 10/06/11 0615 10/05/11 1336  HGB -- 13.0  HCT -- 38.9  PLT -- 215  APTT -- 42*  LABPROT 27.4* 28.4*  INR 2.50* 2.62*  HEPARINUNFRC -- --  CREATININE 1.24* 1.10  CKTOTAL -- --  CKMB -- --  TROPONINI -- --   Estimated Creatinine Clearance: 35.3 ml/min (by C-G formula based on Cr of 1.24).  Medical History: Past Medical History  Diagnosis Date  . Hypertension   . Heart murmur     Severe MR s/p MVR  . GERD (gastroesophageal reflux disease)   . Insomnia   . Eczema   . Glaucoma   . Diastolic dysfunction   . IHSS (idiopathic hypertrophic subaortic stenosis)     s/p septal myomectomy  . High cholesterol   . Atrial fibrillation and flutter     atrial fibrillation  . Paroxysmal atrial tachycardia   . Shortness of breath   . Coronary artery disease   . Blood transfusion   . Chronic kidney disease (CKD), stage III (moderate)   . Severe mitral regurgitation     Medications:  Coumadin 5 mg daily x 2.5 mg MWF. Seen by Alfonse Ras, Pharm.D. In coumadin clinic  Assessment: 78 yo lady admitted with symptomatic bradycardia on coumadin pta for afib.  INR was 3 in office yesterday and coumadin was held yesterday.  Last dose was 2.5 mg on Monday 10/04/11.  Today INR is 2.50.  CBC OK. No bleeding reported.  Dr. Ladona Ridgel to consult for PPM today. Goal of Therapy:  INR 2-3   Plan:  1) Coumadin 5 mg po x 1 dose today 2) Pacemaker planned for today or tomorrow 3) Will leave note in administration instructions to hold if to pacemaker today (unlikely at 4pm) 4)  Follow up plans  Thank you. Okey Regal, PharmD Clinical Pharmacist [phone]

## 2011-10-06 NOTE — Progress Notes (Signed)
Spoke with PA Edmisten about coumadin.  Will hold today's dose if pacemaker placed.  If paced changed to tomorrow then pt will get today's dose.  I will notify pharmacy. Thomas Hoff

## 2011-10-07 ENCOUNTER — Encounter (HOSPITAL_COMMUNITY)
Admission: AD | Disposition: A | Discharge status: Home / Self Care | Payer: Self-pay | Source: Ambulatory Visit | Attending: Cardiology

## 2011-10-07 DIAGNOSIS — R001 Bradycardia, unspecified: Secondary | ICD-10-CM | POA: Diagnosis present

## 2011-10-07 DIAGNOSIS — I498 Other specified cardiac arrhythmias: Secondary | ICD-10-CM

## 2011-10-07 DIAGNOSIS — I4510 Unspecified right bundle-branch block: Secondary | ICD-10-CM

## 2011-10-07 DIAGNOSIS — L910 Hypertrophic scar: Secondary | ICD-10-CM | POA: Insufficient documentation

## 2011-10-07 HISTORY — PX: PERMANENT PACEMAKER INSERTION: SHX5480

## 2011-10-07 LAB — PROTIME-INR
INR: 1.89 — ABNORMAL HIGH (ref 0.00–1.49)
Prothrombin Time: 22 seconds — ABNORMAL HIGH (ref 11.6–15.2)

## 2011-10-07 LAB — SURGICAL PCR SCREEN
MRSA, PCR: NEGATIVE
Staphylococcus aureus: NEGATIVE

## 2011-10-07 SURGERY — PERMANENT PACEMAKER INSERTION
Anesthesia: LOCAL

## 2011-10-07 MED ORDER — CEFAZOLIN SODIUM 1-5 GM-% IV SOLN
INTRAVENOUS | Status: AC
  Filled 2011-10-07: qty 50

## 2011-10-07 MED ORDER — SODIUM CHLORIDE 0.9 % IV SOLN: INTRAVENOUS | Status: DC

## 2011-10-07 MED ORDER — LABETALOL HCL 200 MG PO TABS
200.0000 mg | ORAL_TABLET | Freq: Two times a day (BID) | ORAL | Status: DC
  Administered 2011-10-07 – 2011-10-08 (×2): 200 mg via ORAL
  Filled 2011-10-07 (×3): qty 1

## 2011-10-07 MED ORDER — LABETALOL HCL 5 MG/ML IV SOLN
10.0000 mg | Freq: Once | INTRAVENOUS | Status: DC
  Filled 2011-10-07: qty 4

## 2011-10-07 MED ORDER — LIDOCAINE HCL (PF) 1 % IJ SOLN
INTRAMUSCULAR | Status: AC
  Filled 2011-10-07: qty 60

## 2011-10-07 MED ORDER — ONDANSETRON HCL 4 MG/2ML IJ SOLN: 4.0000 mg | Freq: Four times a day (QID) | INTRAMUSCULAR | Status: DC | PRN

## 2011-10-07 MED ORDER — WARFARIN SODIUM 5 MG PO TABS
5.0000 mg | ORAL_TABLET | Freq: Once | ORAL | Status: AC
  Administered 2011-10-07: 5 mg via ORAL
  Filled 2011-10-07: qty 1

## 2011-10-07 MED ORDER — MIDAZOLAM HCL 5 MG/5ML IJ SOLN
INTRAMUSCULAR | Status: AC
  Filled 2011-10-07: qty 5

## 2011-10-07 MED ORDER — CEFAZOLIN SODIUM 1-5 GM-% IV SOLN
1.0000 g | Freq: Four times a day (QID) | INTRAVENOUS | Status: AC
  Administered 2011-10-07 – 2011-10-08 (×3): 1 g via INTRAVENOUS
  Filled 2011-10-07 (×3): qty 50

## 2011-10-07 MED ORDER — ACETAMINOPHEN 325 MG PO TABS
325.0000 mg | ORAL_TABLET | ORAL | Status: DC | PRN
  Administered 2011-10-07 (×2): 650 mg via ORAL
  Filled 2011-10-07 (×2): qty 2

## 2011-10-07 MED ORDER — HEPARIN (PORCINE) IN NACL 2-0.9 UNIT/ML-% IJ SOLN
INTRAMUSCULAR | Status: AC
  Filled 2011-10-07: qty 1000

## 2011-10-07 NOTE — Progress Notes (Signed)
SUBJECTIVE:  Doing well.  Continues to complain of pounding in her head and chest with PVC's  OBJECTIVE:   Vitals:   Filed Vitals:   10/06/11 1330 10/06/11 2101 10/07/11 0502 10/07/11 0700  BP: 144/48 135/62 119/40   Pulse: 55 30 35 58  Temp: 98.7 F (37.1 C) 98.4 F (36.9 C) 97.8 F (36.6 C)   TempSrc: Oral Oral Oral   Resp: 18 18 18    Height:      Weight:      SpO2: 98% 99% 97%    I&O's:   Intake/Output Summary (Last 24 hours) at 10/07/11 0805 Last data filed at 10/07/11 0737  Gross per 24 hour  Intake 385.67 ml  Output   1150 ml  Net -764.33 ml   TELEMETRY: Reviewed telemetry pt in junctional bradycardia with bigeminal PVC's:     PHYSICAL EXAM General: Well developed, well nourished, in no acute distress Head: Eyes PERRLA, No xanthomas.   Normal cephalic and atramatic  Lungs:   Clear bilaterally to auscultation and percussion. Heart:   HRRR S1 S2 Pulses are 2+ & equal. Abdomen: Bowel sounds are positive, abdomen soft and non-tender without masses  Extremities:   No clubbing, cyanosis or edema.  DP +1 Neuro: Alert and oriented X 3. Psych:  Good affect, responds appropriately   LABS: Basic Metabolic Panel:  Basename 10/06/11 0615 10/05/11 1336  NA 143 142  K 4.1 4.4  CL 109 106  CO2 25 27  GLUCOSE 84 111*  BUN 14 14  CREATININE 1.24* 1.10  CALCIUM 8.9 9.2  MG -- 2.6*  PHOS -- 3.3   Liver Function Tests:  Basename 10/05/11 1336  AST 22  ALT 18  ALKPHOS 54  BILITOT 0.4  PROT 7.4  ALBUMIN 3.8   No results found for this basename: LIPASE:2,AMYLASE:2 in the last 72 hours CBC:  Basename 10/05/11 1336  WBC 7.7  NEUTROABS 5.1  HGB 13.0  HCT 38.9  MCV 88.8  PLT 215    Basename 10/05/11 1336  TSH 2.036  T4TOTAL --  T3FREE --  THYROIDAB --   Anemia Panel: No results found for this basename: VITAMINB12,FOLATE,FERRITIN,TIBC,IRON,RETICCTPCT in the last 72 hours Coag Panel:   Lab Results  Component Value Date   INR 1.89* 10/07/2011   INR  2.50* 10/06/2011   INR 2.62* 10/05/2011    RADIOLOGY: Dg Chest 2 View  10/05/2011  *RADIOLOGY REPORT*  Clinical Data: Arrhythmia.  "Possible PPM".  Heart murmur.  CHEST - 2 VIEW  Comparison: 12/19/2010.  Findings: Lateral view degraded by patient arm position.  Numerous leads and wires project over the chest.  Midline trachea. Prior median sternotomy. No pleural effusion or pneumothorax. Clear lungs.  IMPRESSION: No acute cardiopulmonary disease.  Original Report Authenticated By: Consuello Bossier, M.D.      ASSESSMENT:  1. Junctional Bradycardia  2. Symptomatic bigeminal PVC's  3. Severe MR s/p MVR  4. HOCM s/p septal myomectomy  5. PAT  6. CKD stage 3  7. HTN  8. Diastolic dysfunction  9. Systemic anticoagulation - coumadin on hold for PPM   PLAN:   PPM today Restart coumadin post pacer placement  Quintella Reichert, MD  10/07/2011  8:05 AM

## 2011-10-07 NOTE — Progress Notes (Signed)
Orthopedic Tech Progress Note Patient Details:  Jasmine [name] [birth year] [id number]  Other Ortho Devices Type of Ortho Device: Other (comment) Ortho Device Interventions: Application   Athira Janowicz T 10/07/2011, 2:21 PM

## 2011-10-07 NOTE — Progress Notes (Addendum)
ANTICOAGULATION CONSULT NOTE - follow up  Pharmacy Consult for coumadin Indication: atrial fibrillation  No Known Allergies  Patient Measurements: Height: 5\' 9"  (175.3 cm) Weight: 146 lb 9.7 oz (66.5 kg) IBW/kg (Calculated) : 66.2  Heparin Dosing Weight: 66.5 kg  Vital Signs: Temp: 97.5 F (36.4 C) (05/02 1336) Temp src: Oral (05/02 1336) BP: 149/70 mmHg (05/02 1336) Pulse Rate: 66  (05/02 1336)  Labs:  Basename 10/07/11 0646 10/06/11 0615 10/05/11 1336  HGB -- -- 13.0  HCT -- -- 38.9  PLT -- -- 215  APTT -- -- 42*  LABPROT 22.0* 27.4* 28.4*  INR 1.89* 2.50* 2.62*  HEPARINUNFRC -- -- --  CREATININE -- 1.24* 1.10  CKTOTAL -- -- --  CKMB -- -- --  TROPONINI -- -- --   Estimated Creatinine Clearance: 35.3 ml/min (by C-G formula based on Cr of 1.24).  Medications:  Coumadin 5 mg daily x 2.5 mg MWF. Seen by Alfonse Ras, Pharm.D. In coumadin clinic  Assessment: 78 yo lady admitted with symptomatic bradycardia on coumadin pta for afib.  INR was 3 in office 4/30 and coumadin was held 4/30.  She is s/p  PPM placement this morning. Her INR is 1.89 today after receiving coumadin 5 mg last night.  Coumadin ordered to resume today s/p pacemaker implant for MVR coverage. Goal of Therapy:  INR 2.5-3.5 for MVR   Plan:  1) Coumadin 5 mg po x 1 dose today 2) daily INR. Herby Abraham, Pharm.D. [phone] 10/07/2011 1:50 PM

## 2011-10-07 NOTE — Progress Notes (Signed)
Orthopedic Tech Progress Note Patient Details:  Jasmine [name] 06-17-1924 [id number]  Patient ID: [name], female   DOB: [birth year], 78 y.o.   MRN: [id number] Confirmed that patient already has arm sling.  Ryun Velez T 10/07/2011, 1:57 PM

## 2011-10-07 NOTE — CV Procedure (Signed)
Preop DX::symptomatic sinus bradycardia Post op DX:: same  Procedure  dual pacemaker implantation Venogram  After routine prep and drape, lidocaine was infiltrated in the prepectoral subclavicular region on the left side an incision was made and carried down to later the prepectoral fascia using electrocautery and sharp dissection a pocket was formed similarly. Hemostasis was obtained.  After this, we turned our attention to gaining accessm to the extrathoracic,left subclavian vein. This was accomplished without difficulty and without the aspiration of air or puncture of the artery. 2 separate venipunctures were accomplished; guidewires were placed and retained and sequentially 7 French sheath through which were  passed an passive St Jude lead ventricular lead serial [id number] and an Designer, jewellery atrial lead serial number  CAU [id number] .  The ventricular lead was manipulated to the right ventricular apex with a bipolar R wave was17.3, the pacing impedance was893, the threshold was 0.6 @ 0.4 msec  Current at threshold was   0.6   The right atrial lead was manipulated to the right atrial appendage with a bipolar P-wave  0.9>>1.9, the pacing impedance was 451, the threshold 1@ 0.4 msec   Current at threshold was2 and the current of injury was brisk.  The leads were affixed to the prepectoral fascia and attached to a St Jude pulse generator serial number [id number].  Hemostasis was obtained. The pocket was copiously irrigated with antibiotic containing saline solution. The leads and the pulse generator were placed in the pocket and affixed to the prepectoral fascia. The wound is then closed in 3 layers in normal fashion escept monocryl suture was used because of keloid formation The wound is washed dried and a benzoin Steri-Strip this was applied the account sponge counts and instrument counts were correct at the end of the procedure .Marland Kitchen The patient tolerated the procedure without apparent  complication.  Gerlene Burdock.D.

## 2011-10-07 NOTE — H&P (Addendum)
  See consult noted updated today  She has hx of keloid so will [place] monocryl suture and silicone mesh to reduce likelihood of keloid formation

## 2011-10-08 ENCOUNTER — Inpatient Hospital Stay (HOSPITAL_COMMUNITY): Payer: Medicare HMO

## 2011-10-08 DIAGNOSIS — I498 Other specified cardiac arrhythmias: Principal | ICD-10-CM

## 2011-10-08 LAB — PROTIME-INR
INR: 1.95 — ABNORMAL HIGH (ref 0.00–1.49)
Prothrombin Time: 22.6 seconds — ABNORMAL HIGH (ref 11.6–15.2)

## 2011-10-08 MED ORDER — WARFARIN SODIUM 5 MG PO TABS: 5.0000 mg | ORAL_TABLET | Freq: Every day | ORAL | Status: DC

## 2011-10-08 NOTE — Discharge Summary (Signed)
Patient ID: NATASH BERMAN MRN: [id number] DOB/AGE: [birth year] 78 y.o.  Admit date: 10/05/2011 Discharge date: 10/08/2011  Primary Discharge Diagnosis Symptomatic Junctional bradycardia with bigeminal PVC's Secondary Discharge Diagnosis  Severe MR s/p bioprosthetic MVR  HOCM s/p septal myomectomy  HTN  Reflux esophagitis  Insomnia  Glaucoma  Mild to moderate pulmonary HTN  Mild to moderate AI  Diastolic dysfunction  Paroxysmal atrial tachcyardia  CKD stage 3     Significant Diagnostic Studies: None  Consults: cardiology EP for pacer placement  Hospital Course: This is an 78yo AA female with a history of HOCM s/p septal myomectomy and severe MR s/p bioprosthetic MVR who presented to the office with complaints of fatigue and a pounding sensation in her heart and head corresponding to bigeminal PVC's by EKG.  She was also found to have junctional bradycardia.  She was admitted to the hospital.  She was not on any rate slowing meds.  An EP consult was obtained.  A PPM was placed yesterday 5/2 successfully without complications.  Today she is doing well without any complaints.  It was noted in the EP lab that when paced at 70bpm her PVC's greatly decreased.  She is in stable condition ambulating at time of discharge.   Discharge Exam: Blood pressure 114/46, pulse 59, temperature 98.4 F (36.9 C), temperature source Oral, resp. rate 19, height 5\' 9"  (1.753 m), weight 66.1 kg (145 lb 11.6 oz), SpO2 99.00%.   General appearance: alert HEENT:   Benign LUNG:  CTA bilaterally COR:  RRR no M/R/G, occasional ectopy ABD:  Soft, NT, ND active BS EXT:  No C/E/E Pacer site:  Clean, dry, intact with no hematoma Labs:   Lab Results  Component Value Date   WBC 7.7 10/05/2011   HGB 13.0 10/05/2011   HCT 38.9 10/05/2011   MCV 88.8 10/05/2011   PLT 215 10/05/2011    Lab 10/06/11 0615 10/05/11 1336  NA 143 --  K 4.1 --  CL 109 --  CO2 25 --  BUN 14 --  CREATININE 1.24* --  CALCIUM 8.9 --    PROT -- 7.4  BILITOT -- 0.4  ALKPHOS -- 54  ALT -- 18  AST -- 22  GLUCOSE 84 --   Lab Results  Component Value Date   CKTOTAL 123 12/20/2010   CKMB 3.9 12/20/2010   TROPONINI 0.40* 12/20/2010        Radiology:  *RADIOLOGY REPORT*  Clinical Data: Post pacer insertion  CHEST - 2 VIEW  Comparison: 10/05/2011  Findings: The patient is status post median sternotomy. A dual  lead pacer has been placed since the previous exam via a left  subclavian approach with intact lead wires and lead tips projecting  over the right atrium and right ventricle.  Heart and mediastinal contours are stable with mild ectasia of the  thoracic aorta noted.  The lung fields appear clear with no signs of focal infiltrate or  congestive failure. No pleural fluid is seen. Surgical clips are  noted in the left upper quadrant. Bony structures demonstrate mild  degenerative change of the lower thoracic spine and are otherwise  intact.  IMPRESSION:  No adverse features noted post pacer placement.  Original Report Authenticated By: Bertha Stakes, M.D.  EKG:  AV paced rhythm  FOLLOW UP PLANS AND APPOINTMENTS Discharge Orders    Future Appointments: Provider: Department: Dept Phone: Center:   10/18/2011 3:00 PM [facility] [phone] [facility]     Future Orders  Please Complete By Expires   Diet - low sodium heart healthy      Increase activity slowly      Driving Restrictions      Comments:   No driving for 2 weeks   Lifting restrictions      Comments:   No lifting for 5 days more than 5 pounds   Other Restrictions      Comments:   No shower for 1 week.  Sponge bathe for the first week and avoid getting pacer site wet.   Leave dressing on - Keep it clean, dry, and intact until clinic visit      Call MD for:  temperature >100.4      Call MD for:  persistant nausea and vomiting      Call MD for:  severe uncontrolled pain      Call MD for:  redness, tenderness, or  signs of infection (pain, swelling, redness, odor or green/yellow discharge around incision site)         Follow-up Information    Follow up with FERGUSON,CYNTHIA A, NP on 10/15/2011. (at 9:30am for pacemaker check)    Contact information:   [facility] [address], [address] [place] [address] [phone]       Follow up with Hillis Range, MD on 10/18/2011. (3:00 pm)    Contact information:   [address] [place] [address] [phone]          BRING ALL MEDICATIONS WITH YOU TO FOLLOW UP APPOINTMENTS  Time spent with patient to include physician time:  35 minutes  Signed: Matson Welch R 10/08/2011, 8:09 AM

## 2011-10-08 NOTE — Progress Notes (Signed)
SUBJECTIVE: The patient is doing well today.  At this time, she denies chest pain, shortness of breath, or any new concerns.     Marland Kitchen amLODipine  10 mg Oral Daily  . ceFAZolin      .  ceFAZolin (ANCEF) IV  1 g Intravenous Q6H  . dorzolamide  1 drop Both Eyes BID  . fluticasone  2 spray Each Nare Daily  . furosemide  20 mg Oral Daily  . heparin      . iron polysaccharides  150 mg Oral Q breakfast  . labetalol  200 mg Oral BID  . labetalol  10 mg Intravenous Once  . lidocaine      . lisinopril  20 mg Oral Daily  . loratadine  10 mg Oral Daily  . memantine  10 mg Oral BID  . midazolam      . pantoprazole  40 mg Oral Q1200  . potassium chloride  10 mEq Oral Daily  . simvastatin  10 mg Oral q1800  . sodium chloride  3 mL Intravenous Q12H  . sodium chloride  3 mL Intravenous Q12H  . warfarin  5 mg Oral ONCE-1800  . Warfarin - Pharmacist Dosing Inpatient   Does not apply q1800  . DISCONTD:  ceFAZolin (ANCEF) IV  1 g Intravenous On Call  . DISCONTD: chlorhexidine  60 mL Topical Once  . DISCONTD: gentamicin irrigation  80 mg Irrigation On Call  . DISCONTD: sodium chloride  3 mL Intravenous Q12H      . sodium chloride 50 mL/hr (10/07/11 1345)  . DISCONTD: sodium chloride 50 mL/hr at 10/07/11 0605  . DISCONTD: sodium chloride 66 mL (10/07/11 1025)    OBJECTIVE: Physical Exam: Filed Vitals:   10/07/11 1600 10/07/11 1630 10/07/11 1957 10/08/11 0431  BP: 141/53 140/48 120/55 114/46  Pulse: 66 67 60 59  Temp:   98.2 F (36.8 C) 98.4 F (36.9 C)  TempSrc:   Oral Oral  Resp:   18 19  Height:      Weight:    145 lb 11.6 oz (66.1 kg)  SpO2: 100% 100% 100% 99%    Intake/Output Summary (Last 24 hours) at 10/08/11 0751 Last data filed at 10/08/11 0640  Gross per 24 hour  Intake  712.5 ml  Output    550 ml  Net  162.5 ml    Telemetry reveals atrial pacing  GEN- The patient is well appearing, alert and oriented x 3 today.   Head- normocephalic, atraumatic Eyes-  Sclera clear,  conjunctiva pink Ears- hearing intact Lungs- Clear to ausculation bilaterally, normal work of breathing Heart- Regular rate and rhythm, no murmurs, rubs or gallops, PMI not laterally displaced GI- soft, NT, ND, + BS Extremities- no clubbing, cyanosis, or edema Pacemaker site without hematoma  LABS: Basic Metabolic Panel:  Basename 10/06/11 0615 10/05/11 1336  NA 143 142  K 4.1 4.4  CL 109 106  CO2 25 27  GLUCOSE 84 111*  BUN 14 14  CREATININE 1.24* 1.10  CALCIUM 8.9 9.2  MG -- 2.6*  PHOS -- 3.3   Liver Function Tests:  Basename 10/05/11 1336  AST 22  ALT 18  ALKPHOS 54  BILITOT 0.4  PROT 7.4  ALBUMIN 3.8   No results found for this basename: LIPASE:2,AMYLASE:2 in the last 72 hours CBC:  Basename 10/05/11 1336  WBC 7.7  NEUTROABS 5.1  HGB 13.0  HCT 38.9  MCV 88.8  PLT 215   Cardiac Enzymes: No results found for this  basename: CKTOTAL:3,CKMB:3,CKMBINDEX:3,TROPONINI:3 in the last 72 hours BNP: No components found with this basename: POCBNP:3 D-Dimer: No results found for this basename: DDIMER:2 in the last 72 hours Hemoglobin A1C: No results found for this basename: HGBA1C in the last 72 hours Fasting Lipid Panel: No results found for this basename: CHOL,HDL,LDLCALC,TRIG,CHOLHDL,LDLDIRECT in the last 72 hours Thyroid Function Tests:  Basename 10/05/11 1336  TSH 2.036  T4TOTAL --  T3FREE --  THYROIDAB --   Anemia Panel: No results found for this basename: VITAMINB12,FOLATE,FERRITIN,TIBC,IRON,RETICCTPCT in the last 72 hours  RADIOLOGY: Dg Chest 2 View  10/05/2011  *RADIOLOGY REPORT*  Clinical Data: Arrhythmia.  "Possible PPM".  Heart murmur.  CHEST - 2 VIEW  Comparison: 12/19/2010.  Findings: Lateral view degraded by patient arm position.  Numerous leads and wires project over the chest.  Midline trachea. Prior median sternotomy. No pleural effusion or pneumothorax. Clear lungs.  IMPRESSION: No acute cardiopulmonary disease.  Original Report Authenticated  By: Consuello Bossier, M.D.    ASSESSMENT AND PLAN:  Active Problems:  Sinus bradycardia  RBBB  Keloid  1. Bradycardia- doing well s/p PPM Wound check in 10 days CXR without obvious ptx, device interrogation reviewed today  Further inpatient care per Dr Mayford Knife.  EP to see as needed while here.  Hillis Range, MD 10/08/2011 7:51 AM

## 2011-10-08 NOTE — Discharge Instructions (Signed)
Pacemaker Implantation A pacemaker (pacer) is a small device that acts as a backup or takes over for the natural pacemaker of the heart. The heart has its own electrical system to regulate the beating of the heart muscles. The heart pumps best when it beats in a regular, coordinated rhythm.  A pacer consists of a small device (generator) which produces electrical signals that tell your heart to beat. The generator contains a lithium battery and a tiny computer. Wires (leads) connect the generator to the heart. The pacer is placed under the skin through a small cut. It senses every heartbeat and only fires when the heart rate falls outside certain levels. When the pacer triggers a heart beat, it is called "capture." PROBLEMS THAT MAY BE HELPED BY A PACEMAKER:  Your heart rate is sometimes too slow or irregular.   Fainting, dizziness, weakness or confusion as a result of low blood flow.   Shortness of breath.   Chest pain or angina if the heart needs more blood and oxygen.   Disturbed sleep as a result of abnormal heart rhythm.   Palpitations or the feeling that the heart is beating too fast, too hard or in an irregular way.   Weak heart muscle pumping ability.  PROCEDURE   The pacer may be placed under the skin near the collarbone, while you are under sedation. An abdominal wall location may be another option.   The leads are inserted into a vein that lies just under the collarbone, then guided into place under x-ray. The tips of the wires touch the inside of the heart. The near end of the pacer wires are connected to the generator under the skin.   For individuals with thinner chest walls, it may be possible to feel the device under the skin, and a slight bump may be seen.  HOME CARE INSTRUCTIONS   Keep the incision dry for a week after the procedure.   For about 8 weeks, avoid sudden or jerky movements that pull your arm away from your body. This could change the position of the leads.     Take medicine exactly as directed.   Learn how to check your pulse. Follow directions about when to call or be concerned.   Be physically active every day. Ask your caregiver how and when to increase activity.   Household appliances do not interfere with pacemakers.   Travel by car, train or airplane should not be a problem. Carry a pacemaker ID card in case the device sets off a metal detector.  PACEMAKER CARE:  Avoid putting pressure over the area where the pacer was put in.   Digital cell phones should be kept 12 inches away from the pacemaker. Hold them at the ear on the side opposite of the pacer.   Never leave a cell phone in a pocket over the pacemaker.   Avoid strong electro-magnetic fields. You will not be able to have an MRI scan because of the strong magnets.   Pacer batteries last about 5 years and give off warning signals when they are running low on power. Pacers may be checked every 3 months. This allows plenty of time to change the generator when it is running low on power.   Changing the battery means removing the old generator through the same cut and plugging the existing wires into the new generator.   An EKG or heart monitor is used to see if your pacer is working properly. Sometimes signals may be sent   over a land line phone to your clinic.   Tell emergency responders that you have a pacemaker.  SEEK MEDICAL CARE IF:  You begin to gain weight and your feet and ankles swell.   You have dizzy spells or feel weak.   Your pulse rate drops below the limit or is too fast.  SEEK IMMEDIATE MEDICAL CARE IF:  You faint or pass out.   You have chest pain or shortness of breath.   You are injured and think your pacemaker may have been damaged.   You are suddenly very tired or have pain in your back.   You are worried that your heart is not beating right or cannot feel your pulse.  Document Released: 05/14/2002 Document Revised: 05/13/2011 Document Reviewed:  07/21/2007 [employer] Patient Information 2012 [employer]. 

## 2011-10-08 NOTE — Discharge Summary (Addendum)
   Addendum to d/c summary with discharge meds  Patient ID: Jasmine [name] MRN: [id number] DOB/AGE: 1924/10/09 78 y.o.  Admit date: 10/05/2011 Discharge date: 10/08/2011    Medication List  As of 10/08/2011  8:18 AM      TAKE these medications         acetaminophen 500 MG tablet   Commonly known as: TYLENOL   Take 500 mg by mouth every 6 (six) hours as needed. For pain      albuterol 108 (90 BASE) MCG/ACT inhaler   Commonly known as: PROVENTIL HFA;VENTOLIN HFA   Inhale 2 puffs into the lungs every 4 (four) hours as needed. For shortness of breath      amLODipine 10 MG tablet   Commonly known as: NORVASC   Take 10 mg by mouth daily.      DEXILANT 60 MG capsule   Generic drug: dexlansoprazole   Take 60 mg by mouth daily.      dorzolamide 2 % ophthalmic solution   Commonly known as: TRUSOPT   Place 1 drop into both eyes 2 (two) times daily.      fluticasone 50 MCG/ACT nasal spray   Commonly known as: FLONASE   Place 2 sprays into the nose daily.      furosemide 20 MG tablet   Commonly known as: LASIX   Take 20 mg by mouth daily. Take 1 tablet daily except Monday and Friday take 2 tablets      guaiFENesin 600 MG 12 hr tablet   Commonly known as: MUCINEX   Take 600 mg by mouth 2 (two) times daily as needed. For congestion      iron polysaccharides 40-20 MG capsule   Commonly known as: NIFEREX 60   Take 1 capsule by mouth daily with breakfast.      lisinopril 20 MG tablet   Commonly known as: PRINIVIL,ZESTRIL   Take 20 mg by mouth daily.      loratadine 10 MG tablet   Commonly known as: CLARITIN   Take 10 mg by mouth daily.      LORazepam 1 MG tablet   Commonly known as: ATIVAN   Take 1 mg by mouth at bedtime as needed. For sleep per patient      lovastatin 20 MG tablet   Commonly known as: MEVACOR   Take 20 mg by mouth at bedtime.      memantine 10 MG tablet   Commonly known as: NAMENDA   Take 10 mg by mouth 2 (two) times daily.      potassium chloride 10  MEQ tablet   Commonly known as: K-DUR   Take 10 mEq by mouth daily.      Warfarin 2.5 MG tablet   Commonly known as: COUMADIN   Take 5mg  by mouth daily except 2.5mg  on Monday, Wednesday, Friday

## 2011-10-08 NOTE — Progress Notes (Signed)
Pt given discharge instructions, medication list, follow up appointments and when to call the doctor.  Pt verbalized understanding of instructions. Jasmine [name]

## 2011-10-11 ENCOUNTER — Encounter: Payer: Self-pay | Admitting: *Deleted

## 2011-10-11 DIAGNOSIS — Z950 Presence of cardiac pacemaker: Secondary | ICD-10-CM | POA: Insufficient documentation

## 2011-10-18 ENCOUNTER — Ambulatory Visit (INDEPENDENT_AMBULATORY_CARE_PROVIDER_SITE_OTHER): Payer: Medicare PPO | Admitting: *Deleted

## 2011-10-18 ENCOUNTER — Encounter: Payer: Self-pay | Admitting: Internal Medicine

## 2011-10-18 DIAGNOSIS — I498 Other specified cardiac arrhythmias: Secondary | ICD-10-CM

## 2011-10-18 LAB — PACEMAKER DEVICE OBSERVATION
AL AMPLITUDE: 2.9 mv
AL IMPEDENCE PM: 400 Ohm
BAMS-0001: 150 {beats}/min
BATTERY VOLTAGE: 3.0531 V
RV LEAD AMPLITUDE: 12 mv
VENTRICULAR PACING PM: 1.1

## 2011-10-18 NOTE — Progress Notes (Signed)
Wound check-PPM 

## 2012-01-05 ENCOUNTER — Institutional Professional Consult (permissible substitution) (INDEPENDENT_AMBULATORY_CARE_PROVIDER_SITE_OTHER): Payer: Medicare PPO | Admitting: Cardiothoracic Surgery

## 2012-01-05 VITALS — BP 121/69 | HR 77 | Resp 16 | Ht 65.0 in | Wt 157.0 lb

## 2012-01-05 DIAGNOSIS — L910 Hypertrophic scar: Secondary | ICD-10-CM

## 2012-01-05 DIAGNOSIS — Z952 Presence of prosthetic heart valve: Secondary | ICD-10-CM

## 2012-01-05 DIAGNOSIS — Z954 Presence of other heart-valve replacement: Secondary | ICD-10-CM

## 2012-01-05 NOTE — Progress Notes (Signed)
PCP is Hollice Espy, MD Referring Provider is Quintella Reichert, MD  Chief Complaint  Patient presents with  . Incisional Pain    referral from Dr Mayford Knife for eval on Keloid scar pain, S/P MVR in 2007    HPI: Doing well after mitral valve replacement and septal myomectomy 2007 however she is recently had a pacemaker placed. She is now complaining of tender keloid over her old sternal incision. The keloid is fairly prominent. It has not responded to topical steroid cream. There is no sternal infection at the time of the original surgery. Otherwise the patient is highly functional despite her age of 54 years.   Past Medical History  Diagnosis Date  . Hypertension   . Heart murmur     Severe MR s/p MVR  . GERD (gastroesophageal reflux disease)   . Insomnia   . Eczema   . Glaucoma   . Diastolic dysfunction   . IHSS (idiopathic hypertrophic subaortic stenosis)     s/p septal myomectomy  . High cholesterol   . Atrial fibrillation and flutter     atrial fibrillation  . Paroxysmal atrial tachycardia   . Shortness of breath   . Coronary artery disease   . Blood transfusion   . Chronic kidney disease (CKD), stage III (moderate)   . Severe mitral regurgitation     Past Surgical History  Procedure Date  . Cardiac catheterization   . Cardiac valve replacement 2007    MVR  . Myomectomy 2007    septal  . Tonsillectomy   . Appendectomy   . Cholecystectomy   . Tubal ligation   . Hernia repair     "in my stomach"  . Cataract extraction w/ intraocular lens  implant, bilateral     No family history on file.  Social History History  Substance Use Topics  . Smoking status: Former Smoker    Types: Cigarettes    Quit date: 06/07/1974  . Smokeless tobacco: Former Neurosurgeon    Types: Snuff   Comment: 10/05/11 "quit smoking & dipping snuff; been some years ago"  . Alcohol Use: Yes     10/05/11 "used to drink; quit a long time ago"    Current Outpatient Prescriptions  Medication Sig  Dispense Refill  . acetaminophen (TYLENOL) 500 MG tablet Take 500 mg by mouth every 6 (six) hours as needed. For pain      . albuterol (PROVENTIL HFA;VENTOLIN HFA) 108 (90 BASE) MCG/ACT inhaler Inhale 2 puffs into the lungs every 4 (four) hours as needed. For shortness of breath      . amLODipine (NORVASC) 10 MG tablet Take 10 mg by mouth daily.      . Calcium Carbonate-Vitamin D (CALTRATE 600+D) 600-400 MG-UNIT per tablet Take 1 tablet by mouth daily.      Marland Kitchen dexlansoprazole (DEXILANT) 60 MG capsule Take 60 mg by mouth daily.      . dorzolamide (TRUSOPT) 2 % ophthalmic solution Place 1 drop into both eyes 2 (two) times daily.      . fluticasone (FLONASE) 50 MCG/ACT nasal spray Place 2 sprays into the nose daily.      . furosemide (LASIX) 20 MG tablet Take 20 mg by mouth daily. Take 1 tablet daily except Monday and Friday take 2 tablets      . guaiFENesin (MUCINEX) 600 MG 12 hr tablet Take 600 mg by mouth 2 (two) times daily as needed. For congestion      . iron polysaccharides (NIFEREX 60) 40-20 MG  capsule Take 1 capsule by mouth daily with breakfast.      . lisinopril (PRINIVIL,ZESTRIL) 20 MG tablet Take 20 mg by mouth daily.      Marland Kitchen loratadine (CLARITIN) 10 MG tablet Take 10 mg by mouth daily.      Marland Kitchen LORazepam (ATIVAN) 1 MG tablet Take 1 mg by mouth at bedtime as needed. For sleep per patient      . lovastatin (MEVACOR) 20 MG tablet Take 20 mg by mouth at bedtime.      . memantine (NAMENDA) 10 MG tablet Take 10 mg by mouth 2 (two) times daily.      . metoprolol succinate (TOPROL-XL) 25 MG 24 hr tablet Take 25 mg by mouth daily.       . potassium chloride (K-DUR) 10 MEQ tablet Take 10 mEq by mouth daily.      Marland Kitchen warfarin (COUMADIN) 5 MG tablet Take 1 tablet (5 mg total) by mouth daily. Take 1 tablet daily by mouth except take 2.5mg  daily on Monday/Wednesday and Friday        No Known Allergies  Review of Systems no fever or drainage from the incision  BP 121/69  Pulse 77  Resp 16  Ht 5\' 5"   (1.651 m)  Wt 157 lb (71.215 kg)  BMI 26.13 kg/m2  SpO2 99% Physical Exam Alert and pleasant Breath sounds clear Cardiac rhythm regular no murmur Prominent keloid for the majority of the sternal incision with severe retraction of surrounding skin  Diagnostic Tests: None   Impression: Painful keloid   Plan: Trial Cymbalta 30 mg each bedtime return for evaluation in 4 weeks

## 2012-02-11 ENCOUNTER — Ambulatory Visit
Admission: RE | Admit: 2012-02-11 | Discharge: 2012-02-11 | Disposition: A | Discharge status: Home / Self Care | Payer: Medicare PPO | Source: Ambulatory Visit | Attending: Family Medicine | Admitting: Family Medicine

## 2012-02-11 ENCOUNTER — Other Ambulatory Visit: Payer: Self-pay | Admitting: Family Medicine

## 2012-02-11 DIAGNOSIS — R0602 Shortness of breath: Secondary | ICD-10-CM

## 2012-02-11 DIAGNOSIS — R05 Cough: Secondary | ICD-10-CM

## 2012-02-16 ENCOUNTER — Ambulatory Visit: Payer: Medicare PPO | Admitting: Cardiothoracic Surgery

## 2012-05-22 ENCOUNTER — Other Ambulatory Visit: Payer: Self-pay | Admitting: Cardiology

## 2012-05-23 ENCOUNTER — Encounter (HOSPITAL_COMMUNITY): Payer: Self-pay

## 2012-05-23 ENCOUNTER — Ambulatory Visit (HOSPITAL_COMMUNITY): Admit: 2012-05-23 | Payer: Medicare PPO | Admitting: Cardiology

## 2012-05-23 SURGERY — CARDIOVERSION
Anesthesia: Monitor Anesthesia Care

## 2012-05-29 ENCOUNTER — Other Ambulatory Visit (HOSPITAL_COMMUNITY): Payer: Self-pay | Admitting: Ophthalmology

## 2012-05-29 DIAGNOSIS — G453 Amaurosis fugax: Secondary | ICD-10-CM

## 2012-06-12 ENCOUNTER — Ambulatory Visit (HOSPITAL_COMMUNITY): Payer: Medicare PPO

## 2012-06-29 ENCOUNTER — Other Ambulatory Visit: Payer: Self-pay | Admitting: Cardiology

## 2012-06-29 NOTE — H&P (Signed)
Office Visit     Patient: Jasmine [name], Jasmine [name] Provider: Marcelino Campos, MD  DOB: 04/02/1925 Age: 79 Y Sex: Female Date: 06/28/2012  Phone: [phone]   Address: [address], [place], [place]-[address]  Pcp: DONNA GATES       Subjective:     CC:    1. 6MO FOLLOW UP & SEE JEREMY.        HPI:  General:  The patient presents today for followup of her MV repair, HTN, afib and PVC's. She is doing well. She denies any chest pain but occasionally has some mild DOE. She denies any palpitations, dizziness or syncope. Occasionally she will notice her heart skip a beat..        ROS:  See HPI, A twelve system review was perfomed at today's visit. For pertinent positives and negatives see HPI.       Medical History: Allergies/asthma, Hypertension, Reflux esophagitis, Insomnia, Eczema, Glaucoma, mild TR with mild to moderate pulmonary hypertension, normal LVF with mild LV hypertrophy, mild MR by recent echo, s/p mitral valve replacement due to severe MR 2007, Mild to moderate aortic insufficiency, Diastolic dysfunction, Idiopathic hypertrophic subaortic stenosis s/p ventricular septal myomectomy, Paroxysmal atrial tachycardia, bradycardia s/p PPM, CKD- stage 3, Frequent PVC's.        Surgical History: mitral valve replacement with pericardial tissue valve and ventricular septal myomectomy for IHSS 2007, cholecystectomy 1954, colectomy, partial 1987, hiatal hernia and appendectomy 1980s, bilateral cataracts , tonsillectomy childhood, Pacemaker inserted by Dr Allred 10/08/11.        Family History: Father: deceased 81 yrs unknown Mother: deceased 92 yrs stroke, high blood pressure, diabetes Brother 1: alive 73 yrs high blood pressure, stroke        Social History:  General:  History of smoking  cigarettes: Former smoker Quit in year 1976 no Smoking.  no Alcohol.  Caffeine: yes, tea, coffee, one daily.  no Recreational drug use.  Exercise: yes, intermittent, walking.  Occupation: retired.    Marital Status: widowed. husband died 07/2000.  Children: 1 daughter deceased, 2 sons living and 1 daughter living.  Seat belt use: yes.        Medications: Lasix 20 MG Tablet 1 tablet once a day except monday and friday to take 2 tablets, Caltrate 600+D 600-400 MG-UNIT Tablet 1 tablet once a day, Dorzolamide HCl 2 % Solution 1 drop each eye twice a day, Imodium Multi-Symptom Relief 2-125 MG Tablet Chewable 1 capsule as needed, Dexilant 60 mg capsule 1 tablet every morning, Mucinex 600 MG Tablet Extended Release 12 Hour 1 tablet for chest congestion as needed, Tylenol Extra Strength 500 MG Tablet 2 tablets as needed, Iron 28 mg Tablet 1 tablet once a day, Align Capsule 1 capsule as needed, ProAir HFA 108 (90 Base) MCG/ACT Aerosol Solution 2 puffs as needed every 4 hrs, Ranitidine HCl 150 MG Capsule 1 capsule Twice a day, Pulmicort Flexhaler 180 MCG/ACT Aerosol Powder Breath Activated as directed as needed, Flonase 50 MCG/ACT Suspension 2 sprays as needed, Namenda 10 MG Tablet 1 tablet twice a day, Lovastatin 20 MG Tablet 1 tablet every evening, Lorazepam 1 MG Tablet 1 tablet at bedtime if needed, Toprol XL 25 MG Tablet Extended Release 24 Hour 1 tablet Once a day, Amlodipine Besylate 10 MG Tablet 1/2 tablet once a day, Lisinopril 20 MG Tablet 1 tablet once a day, Potassium Chloride CR 10MEQ ER Tablet TAKE ONE TABLET BY MOUTH EVERY DAY , Warfarin Sodium 5MG Tablet per pharmD 5 mg qd except 2.5 mg   T/Th/Sun, Medication List reviewed and reconciled with the patient       Allergies: Cymbalta: diarrhea, weakness: Side Effects.       Objective:     Vitals: Wt 159.2, Wt change -1.4 lb, Ht 65, BMI 26.49, Pulse sitting 88, BP sitting 126/72.       Examination:  Cardiology, General:  GENERAL APPEARANCE: pleasant, NAD.  HEENT: unremarkable.  CAROTID UPSTROKE: normal, no bruit.  JVD: flat.  HEART SOUNDS: regular, normal S1, S2, no S3 or S4.  MURMUR: absent.  LUNGS: no rales or wheezes.  ABDOMEN: soft,  non tender, positive bowel sounds, no masses felt.  EXTREMITIES: no leg edema.  PERIPHERAL PULSES: 2 plus bilateral.        Assessment:     Assessment:  1. Atrial fibrillation - 427.31 (Primary)  2. Left Bundle Branch Block - 426.3  3. Anticoagulant monitoring - V58.61  4. Benign hypertensive heart disease without heart failure - 402.10  5. Encounter for long-term (current) use of med (excludes anticoagulant, NSAIDS, steroids, aspirin, insulin) - V58.69  6. Mitral Regurgitation - 424.0    Plan:     1. Atrial fibrillation Continue Warfarin Sodium Tablet, 5MG, per pharmD, Orally, 5 mg qd except 2.5 mg T/Th/Sun ; Continue Toprol XL Tablet Extended Release 24 Hour, 25 MG, 1 tablet, Orally, Once a day .  LAB: Basic Metabolic Stable    GLUCOSE 89 70-99 - mg/dL    BUN 11 6-26 - mg/dL    CREATININE 1.12 0.60-1.30 - mg/dl    eGFR (NON-AFRICAN AMERICAN) 46 >60 - calc L   eGFR (AFRICAN AMERICAN) 56 >60 - calc L   SODIUM 141 136-145 - mmol/L    POTASSIUM 4.0 3.5-5.5 - mmol/L    CHLORIDE 106 98-107 - mmol/L    C02 30 22-32 - mmol/L    ANION GAP 8.5 6.0-20.0 - mmol/L    CALCIUM 9.4 8.6-10.3 - mg/dL     Joory Gough [name] 06/29/2012 08:58:50 AM > Corson,Danielle 06/29/2012 10:38:22 AM > Pt is aware.    LAB: CBC with Diff Normal    WBC 7.4 4.0-11.0 - K/ul    RBC 4.55 4.20-5.40 - [name]/uL    HGB 13.0 12.0-16.0 - g/dL    HCT 40.6 37.0-47.0 - %    MCH 28.5 27.0-33.0 - pg    MPV 7.2 7.5-10.7 - fL L   MCV 89.1 81.0-99.0 - fL    MCHC 32.0 32.0-36.0 - g/dL    RDW 14.0 11.5-15.5 - %    NRBC# 0.00 -    PLT 221 150-400 - K/uL    NEUT % 67.2 43.3-71.9 - %    NRBC% 0.00 - %    LYMPH% 20.6 16.8-43.5 - %    MONO % 8.6 4.6-12.4 - %    EOS % 2.8 0.0-7.8 - %    BASO % 0.8 0.0-1.0 - %    NEUT # 5.0 1.9-7.2 - K/uL    LYMPH# 1.50 1.10-2.70 - K/uL    MONO # 0.6 0.3-0.8 - K/uL    EOS # 0.2 0.0-0.6 - K/uL    BASO # 0.1 0.0-0.1 - K/uL     Enyla Lisbon [name] 06/29/2012 08:58:30 AM > Corson,Danielle 06/29/2012  10:38:33 AM > Pt is aware.    Diagnostic Imaging:EKG vpaced, Corson,Danielle 06/28/2012 10:08:34 AM > Charlene Cowdrey [name] 06/28/2012 10:24:23 AM >  Her INR's have been therapuetic for 4 weeks so I will set her up for DCCV next week.       2. Anticoagulant monitoring    The patient's PT/INR will be checked in clininc today and anticoagulation adjusted as needed and will be reviewed by me. Please refer to coumadin clinic note.       3. Benign hypertensive heart disease without heart failure Continue Lisinopril Tablet, 20 MG, 1 tablet, Orally, once a day ; Continue Amlodipine Besylate Tablet, 10 MG, 1/2 tablet, Orally, once a day ; Continue Toprol XL Tablet Extended Release 24 Hour, 25 MG, 1 tablet, Orally, Once a day .       4. Mitral Regurgitation Continue Lasix Tablet, 20 MG, 1 tablet, Orally, once a day except monday and friday to take 2 tablets .        Procedures:  Venipuncture:  Venipuncture: Howell,Kathleen 06/28/2012 05:59:43 PM > , performed in right arm.        Immunizations:        Labs:        Procedure Codes: 93000 EKG I AND R, 80048 ECL BMP, 85025 ECL CBC PLATELET DIFF, 36415 BLOOD COLLECTION ROUTINE VENIPUNCTURE       Preventive:         Follow Up: 6 Months (Reason: afib/PVC/HTN/MR)      Provider: Korynn Kenedy, MD  Patient: Larin, Mickie [name] DOB: 06/24/1924 Date: 06/28/2012    

## 2012-06-30 NOTE — H&P (Signed)
Office Visit     Patient: Jasmine [name], Jasmine [name] Provider: Traci Turner, MD  DOB: 06/27/1924 Age: 79 Y Sex: Female Date: 06/28/2012  Phone: [phone]   Address: [address], [place], [place]  Pcp: DONNA GATES       Subjective:     CC:    1. 6MO FOLLOW UP & SEE JEREMY.        HPI:  General:  The patient presents today for followup of her MV repair, HTN, afib and PVC's. She is doing well. She denies any chest pain but occasionally has some mild DOE. She denies any palpitations, dizziness or syncope. Occasionally she will notice her heart skip a beat..        ROS:  See HPI, A twelve system review was perfomed at today's visit. For pertinent positives and negatives see HPI.       Medical History: Allergies/asthma, Hypertension, Reflux esophagitis, Insomnia, Eczema, Glaucoma, mild TR with mild to moderate pulmonary hypertension, normal LVF with mild LV hypertrophy, mild MR by recent echo, s/p mitral valve replacement due to severe MR 2007, Mild to moderate aortic insufficiency, Diastolic dysfunction, Idiopathic hypertrophic subaortic stenosis s/p ventricular septal myomectomy, Paroxysmal atrial tachycardia, bradycardia s/p PPM, CKD- stage 3, Frequent PVC's.        Surgical History: mitral valve replacement with pericardial tissue valve and ventricular septal myomectomy for IHSS 2007, cholecystectomy 1954, colectomy, partial 1987, hiatal hernia and appendectomy 1980s, bilateral cataracts , tonsillectomy childhood, Pacemaker inserted by Dr Allred 10/08/11.        Family History: Father: deceased 81 yrs unknown Mother: deceased 92 yrs stroke, high blood pressure, diabetes Brother 1: alive 73 yrs high blood pressure, stroke        Social History:  General:  History of smoking  cigarettes: Former smoker Quit in year 1976 no Smoking.  no Alcohol.  Caffeine: yes, tea, coffee, one daily.  no Recreational drug use.  Exercise: yes, intermittent, walking.  Occupation: retired.    Marital Status: widowed. husband died 07/2000.  Children: 1 daughter deceased, 2 sons living and 1 daughter living.  Seat belt use: yes.        Medications: Lasix 20 MG Tablet 1 tablet once a day except monday and friday to take 2 tablets, Caltrate 600+D 600-400 MG-UNIT Tablet 1 tablet once a day, Dorzolamide HCl 2 % Solution 1 drop each eye twice a day, Imodium Multi-Symptom Relief 2-125 MG Tablet Chewable 1 capsule as needed, Dexilant 60 mg capsule 1 tablet every morning, Mucinex 600 MG Tablet Extended Release 12 Hour 1 tablet for chest congestion as needed, Tylenol Extra Strength 500 MG Tablet 2 tablets as needed, Iron 28 mg Tablet 1 tablet once a day, Align Capsule 1 capsule as needed, ProAir HFA 108 (90 Base) MCG/ACT Aerosol Solution 2 puffs as needed every 4 hrs, Ranitidine HCl 150 MG Capsule 1 capsule Twice a day, Pulmicort Flexhaler 180 MCG/ACT Aerosol Powder Breath Activated as directed as needed, Flonase 50 MCG/ACT Suspension 2 sprays as needed, Namenda 10 MG Tablet 1 tablet twice a day, Lovastatin 20 MG Tablet 1 tablet every evening, Lorazepam 1 MG Tablet 1 tablet at bedtime if needed, Toprol XL 25 MG Tablet Extended Release 24 Hour 1 tablet Once a day, Amlodipine Besylate 10 MG Tablet 1/2 tablet once a day, Lisinopril 20 MG Tablet 1 tablet once a day, Potassium Chloride CR 10MEQ ER Tablet TAKE ONE TABLET BY MOUTH EVERY DAY , Warfarin Sodium 5MG Tablet per pharmD 5 mg qd except 2.5 mg   T/Th/Sun, Medication List reviewed and reconciled with the patient       Allergies: Cymbalta: diarrhea, weakness: Side Effects.       Objective:     Vitals: Wt 159.2, Wt change -1.4 lb, Ht 65, BMI 26.49, Pulse sitting 88, BP sitting 126/72.       Examination:  Cardiology, General:  GENERAL APPEARANCE: pleasant, NAD.  HEENT: unremarkable.  CAROTID UPSTROKE: normal, no bruit.  JVD: flat.  HEART SOUNDS: regular, normal S1, S2, no S3 or S4.  MURMUR: absent.  LUNGS: no rales or wheezes.  ABDOMEN: soft,  non tender, positive bowel sounds, no masses felt.  EXTREMITIES: no leg edema.  PERIPHERAL PULSES: 2 plus bilateral.        Assessment:     Assessment:  1. Atrial fibrillation - 427.31 (Primary)  2. Left Bundle Branch Block - 426.3  3. Anticoagulant monitoring - V58.61  4. Benign hypertensive heart disease without heart failure - 402.10  5. Encounter for long-term (current) use of med (excludes anticoagulant, NSAIDS, steroids, aspirin, insulin) - V58.69  6. Mitral Regurgitation - 424.0    Plan:     1. Atrial fibrillation Continue Warfarin Sodium Tablet, 5MG, per pharmD, Orally, 5 mg qd except 2.5 mg T/Th/Sun ; Continue Toprol XL Tablet Extended Release 24 Hour, 25 MG, 1 tablet, Orally, Once a day .  LAB: Basic Metabolic Stable    GLUCOSE 89 70-99 - mg/dL    BUN 11 6-26 - mg/dL    CREATININE 1.12 0.60-1.30 - mg/dl    eGFR (NON-AFRICAN AMERICAN) 46 >60 - calc L   eGFR (AFRICAN AMERICAN) 56 >60 - calc L   SODIUM 141 136-145 - mmol/L    POTASSIUM 4.0 3.5-5.5 - mmol/L    CHLORIDE 106 98-107 - mmol/L    C02 30 22-32 - mmol/L    ANION GAP 8.5 6.0-20.0 - mmol/L    CALCIUM 9.4 8.6-10.3 - mg/dL     TURNER,TRACI [name] 06/29/2012 08:58:50 AM > Corson,Danielle 06/29/2012 10:38:22 AM > Pt is aware.    LAB: CBC with Diff Normal    WBC 7.4 4.0-11.0 - K/ul    RBC 4.55 4.20-5.40 - [name]/uL    HGB 13.0 12.0-16.0 - g/dL    HCT 40.6 37.0-47.0 - %    MCH 28.5 27.0-33.0 - pg    MPV 7.2 7.5-10.7 - fL L   MCV 89.1 81.0-99.0 - fL    MCHC 32.0 32.0-36.0 - g/dL    RDW 14.0 11.5-15.5 - %    NRBC# 0.00 -    PLT 221 150-400 - K/uL    NEUT % 67.2 43.3-71.9 - %    NRBC% 0.00 - %    LYMPH% 20.6 16.8-43.5 - %    MONO % 8.6 4.6-12.4 - %    EOS % 2.8 0.0-7.8 - %    BASO % 0.8 0.0-1.0 - %    NEUT # 5.0 1.9-7.2 - K/uL    LYMPH# 1.50 1.10-2.70 - K/uL    MONO # 0.6 0.3-0.8 - K/uL    EOS # 0.2 0.0-0.6 - K/uL    BASO # 0.1 0.0-0.1 - K/uL     TURNER,TRACI [name] 06/29/2012 08:58:30 AM > Corson,Danielle 06/29/2012  10:38:33 AM > Pt is aware.    Diagnostic Imaging:EKG vpaced, Corson,Danielle 06/28/2012 10:08:34 AM > TURNER,TRACI [name] 06/28/2012 10:24:23 AM >  Her INR's have been therapuetic for 4 weeks so I will set her up for DCCV next week.       2. Anticoagulant monitoring    The patient's PT/INR will be checked in clininc today and anticoagulation adjusted as needed and will be reviewed by me. Please refer to coumadin clinic note.       3. Benign hypertensive heart disease without heart failure Continue Lisinopril Tablet, 20 MG, 1 tablet, Orally, once a day ; Continue Amlodipine Besylate Tablet, 10 MG, 1/2 tablet, Orally, once a day ; Continue Toprol XL Tablet Extended Release 24 Hour, 25 MG, 1 tablet, Orally, Once a day .       4. Mitral Regurgitation Continue Lasix Tablet, 20 MG, 1 tablet, Orally, once a day except monday and friday to take 2 tablets .        Procedures:  Venipuncture:  Venipuncture: Howell,Kathleen 06/28/2012 05:59:43 PM > , performed in right arm.        Immunizations:        Labs:        Procedure Codes: 93000 EKG I AND R, 80048 ECL BMP, 85025 ECL CBC PLATELET DIFF, 36415 BLOOD COLLECTION ROUTINE VENIPUNCTURE       Preventive:         Follow Up: 6 Months (Reason: afib/PVC/HTN/MR)      Provider: Traci Turner, MD  Patient: Poppen, Jacqulin [name] DOB: 02/14/1925 Date: 06/28/2012    

## 2012-07-04 ENCOUNTER — Encounter (HOSPITAL_COMMUNITY): Payer: Self-pay | Admitting: Certified Registered Nurse Anesthetist

## 2012-07-04 ENCOUNTER — Encounter (HOSPITAL_COMMUNITY): Payer: Self-pay | Admitting: Gastroenterology

## 2012-07-04 ENCOUNTER — Ambulatory Visit (HOSPITAL_COMMUNITY)
Admission: RE | Admit: 2012-07-04 | Discharge: 2012-07-04 | Disposition: A | Discharge status: Home / Self Care | Payer: Medicare PPO | Source: Ambulatory Visit | Attending: Cardiology | Admitting: Cardiology

## 2012-07-04 ENCOUNTER — Ambulatory Visit (HOSPITAL_COMMUNITY): Payer: Medicare PPO | Admitting: Certified Registered Nurse Anesthetist

## 2012-07-04 ENCOUNTER — Encounter (HOSPITAL_COMMUNITY)
Admission: RE | Disposition: A | Discharge status: Home / Self Care | Payer: Self-pay | Source: Ambulatory Visit | Attending: Cardiology

## 2012-07-04 DIAGNOSIS — I119 Hypertensive heart disease without heart failure: Secondary | ICD-10-CM | POA: Insufficient documentation

## 2012-07-04 DIAGNOSIS — Z954 Presence of other heart-valve replacement: Secondary | ICD-10-CM | POA: Insufficient documentation

## 2012-07-04 DIAGNOSIS — I4510 Unspecified right bundle-branch block: Secondary | ICD-10-CM

## 2012-07-04 DIAGNOSIS — I4891 Unspecified atrial fibrillation: Secondary | ICD-10-CM

## 2012-07-04 DIAGNOSIS — Z7901 Long term (current) use of anticoagulants: Secondary | ICD-10-CM | POA: Insufficient documentation

## 2012-07-04 DIAGNOSIS — Z950 Presence of cardiac pacemaker: Secondary | ICD-10-CM

## 2012-07-04 DIAGNOSIS — I482 Chronic atrial fibrillation, unspecified: Secondary | ICD-10-CM | POA: Diagnosis present

## 2012-07-04 DIAGNOSIS — I340 Nonrheumatic mitral (valve) insufficiency: Secondary | ICD-10-CM

## 2012-07-04 HISTORY — PX: CARDIOVERSION: SHX1299

## 2012-07-04 SURGERY — CARDIOVERSION
Anesthesia: Monitor Anesthesia Care

## 2012-07-04 MED ORDER — PROPOFOL 10 MG/ML IV BOLUS
INTRAVENOUS | Status: DC | PRN
  Administered 2012-07-04: 120 mg via INTRAVENOUS

## 2012-07-04 MED ORDER — SODIUM CHLORIDE 0.9 % IV SOLN
INTRAVENOUS | Status: DC | PRN
  Administered 2012-07-04: 09:00:00 via INTRAVENOUS

## 2012-07-04 MED ORDER — SODIUM CHLORIDE 0.9 % IV SOLN
INTRAVENOUS | Status: DC
  Administered 2012-07-04: 500 mL via INTRAVENOUS

## 2012-07-04 NOTE — CV Procedure (Signed)
Electrical Cardioversion Procedure Note Jasmine [name] [id number] March 13, 1925  Procedure: Electrical Cardioversion Indications:  Atrial Fibrillation  Time Out: Verified patient identification, verified procedure,medications/allergies/relevent history reviewed, required imaging and test results available.  Performed  Procedure Details  The patient was NPO after midnight. Anesthesia was administered at the beside  by Dr.Frederick with 120mg  of propofol.  Cardioversion was done with synchronized biphasic defibrillation with AP pads with 200watts.  The patient converted to normal sinus rhythm. The patient tolerated the procedure well   IMPRESSION:  Successful cardioversion of atrial fibrillation    Philip Eckersley R 07/04/2012, 9:20 AM

## 2012-07-04 NOTE — Anesthesia Postprocedure Evaluation (Signed)
  Anesthesia Post-op Note  Patient: Jasmine [name]  Procedure(s) Performed: Procedure(s) (LRB) with comments: CARDIOVERSION (N/A) - h/p in file drawer   Patient Location: Endoscopy Unit  Anesthesia Type:MAC  Level of Consciousness: awake, alert  and oriented  Airway and Oxygen Therapy: Patient Spontanous Breathing and Patient connected to nasal cannula oxygen  Post-op Pain: none  Post-op Assessment: Post-op Vital signs reviewed, Patient's Cardiovascular Status Stable, Respiratory Function Stable, Patent Airway and No signs of Nausea or vomiting  Post-op Vital Signs: Reviewed and stable  Complications: No apparent anesthesia complications

## 2012-07-04 NOTE — Anesthesia Postprocedure Evaluation (Signed)
Anesthesia Post Note  Patient: Jasmine [name]  Procedure(s) Performed: Procedure(s) (LRB): CARDIOVERSION (N/A)  Anesthesia type: General  Patient location: PACU  Post pain: Pain level controlled  Post assessment: Patient's Cardiovascular Status Stable  Last Vitals:  Filed Vitals:   07/04/12 1000  BP: 126/72  Pulse:   Temp:   Resp: 19    Post vital signs: Reviewed and stable  Level of consciousness: alert  Complications: No apparent anesthesia complications

## 2012-07-04 NOTE — H&P (View-Only) (Signed)
Office Visit     Patient: Jasmine [name], Jasmine [name] Provider: Armanda Magic, MD  DOB: [birth year] Age: 79 Y Sex: Female Date: 06/28/2012  Phone: [phone]   Address: [address], [place], [place]  Pcp: DONNA GATES       Subjective:     CC:    1. FOLLOW UP & SEE JEREMY.        HPI:  General:  The patient presents today for followup of her MV repair, HTN, afib and PVC's. She is doing well. She denies any chest pain but occasionally has some mild DOE. She denies any palpitations, dizziness or syncope. Occasionally she will notice her heart skip a beat..        ROS:  See HPI, A twelve system review was perfomed at today's visit. For pertinent positives and negatives see HPI.       Medical History: Allergies/asthma, Hypertension, Reflux esophagitis, Insomnia, Eczema, Glaucoma, mild TR with mild to moderate pulmonary hypertension, normal LVF with mild LV hypertrophy, mild MR by recent echo, s/p mitral valve replacement due to severe MR 2007, Mild to moderate aortic insufficiency, Diastolic dysfunction, Idiopathic hypertrophic subaortic stenosis s/p ventricular septal myomectomy, Paroxysmal atrial tachycardia, bradycardia s/p PPM, CKD- stage 3, Frequent PVC's.        Surgical History: mitral valve replacement with pericardial tissue valve and ventricular septal myomectomy for IHSS 2007, cholecystectomy 1954, colectomy, partial 1987, hiatal hernia and appendectomy 1980s, bilateral cataracts , tonsillectomy childhood, Pacemaker inserted by Dr Johney Frame 10/08/11.        Family History: Father: deceased 32 yrs unknown Mother: deceased 54 yrs stroke, high blood pressure, diabetes Brother 1: alive 7 yrs high blood pressure, stroke        Social History:  General:  History of smoking  cigarettes: Former smoker Quit in year 1976 no Smoking.  no Alcohol.  Caffeine: yes, tea, coffee, one daily.  no Recreational drug use.  Exercise: yes, intermittent, walking.  Occupation: retired.    Marital Status: widowed. husband died Jul 28, 2000.  Children: 1 daughter deceased, 2 sons living and 1 daughter living.  Seat belt use: yes.        Medications: Lasix 20 MG Tablet 1 tablet once a day except monday and friday to take 2 tablets, Caltrate 600+D 600-400 MG-UNIT Tablet 1 tablet once a day, Dorzolamide HCl 2 % Solution 1 drop each eye twice a day, Imodium Multi-Symptom Relief 2-125 MG Tablet Chewable 1 capsule as needed, Dexilant 60 mg capsule 1 tablet every morning, Mucinex 600 MG Tablet Extended Release 12 Hour 1 tablet for chest congestion as needed, Tylenol Extra Strength 500 MG Tablet 2 tablets as needed, Iron 28 mg Tablet 1 tablet once a day, Align Capsule 1 capsule as needed, ProAir HFA 108 (90 Base) MCG/ACT Aerosol Solution 2 puffs as needed every 4 hrs, Ranitidine HCl 150 MG Capsule 1 capsule Twice a day, Pulmicort Flexhaler 180 MCG/ACT Aerosol Powder Breath Activated as directed as needed, Flonase 50 MCG/ACT Suspension 2 sprays as needed, Namenda 10 MG Tablet 1 tablet twice a day, Lovastatin 20 MG Tablet 1 tablet every evening, Lorazepam 1 MG Tablet 1 tablet at bedtime if needed, Toprol XL 25 MG Tablet Extended Release 24 Hour 1 tablet Once a day, Amlodipine Besylate 10 MG Tablet 1/2 tablet once a day, Lisinopril 20 MG Tablet 1 tablet once a day, Potassium Chloride CR ER Tablet TAKE ONE TABLET BY MOUTH EVERY DAY , Warfarin Sodium 5MG  Tablet per pharmD 5 mg qd except 2.5 mg  T/Th/Sun, Medication List reviewed and reconciled with the patient       Allergies: Cymbalta: diarrhea, weakness: Side Effects.       Objective:     Vitals: Wt 159.2, Wt change -1.4 lb, Ht 65, BMI 26.49, Pulse sitting 88, BP sitting 126/72.       Examination:  Cardiology, General:  GENERAL APPEARANCE: pleasant, NAD.  HEENT: unremarkable.  CAROTID UPSTROKE: normal, no bruit.  JVD: flat.  HEART SOUNDS: regular, normal S1, S2, no S3 or S4.  MURMUR: absent.  LUNGS: no rales or wheezes.  ABDOMEN: soft,  non tender, positive bowel sounds, no masses felt.  EXTREMITIES: no leg edema.  PERIPHERAL PULSES: 2 plus bilateral.        Assessment:     Assessment:  1. Atrial fibrillation - 427.31 (Primary)  2. Left Bundle Branch Block - 426.3  3. Anticoagulant monitoring - V58.61  4. Benign hypertensive heart disease without heart failure - 402.10  5. Encounter for long-term (current) use of med (excludes anticoagulant, NSAIDS, steroids, aspirin, insulin) - V58.69  6. Mitral Regurgitation - 424.0    Plan:     1. Atrial fibrillation Continue Warfarin Sodium Tablet, 5MG , per pharmD, Orally, 5 mg qd except 2.5 mg T/Th/Sun ; Continue Toprol XL Tablet Extended Release 24 Hour, 25 MG, 1 tablet, Orally, Once a day .  LAB: Basic Metabolic Stable    GLUCOSE 89 16-10 - mg/dL    BUN 11 9-60 - mg/dL    CREATININE 4.54 0.98-1.19 - mg/dl    eGFR (NON-AFRICAN AMERICAN) 46 >60 - calc L   eGFR (AFRICAN AMERICAN) 56 >60 - calc L   SODIUM 141 136-145 - mmol/L    POTASSIUM 4.0 3.5-5.5 - mmol/L    CHLORIDE 106 98-107 - mmol/L    C02 30 22-32 - mmol/L    ANION GAP 8.5 6.0-20.0 - mmol/L    CALCIUM 9.4 8.6-10.3 - mg/dL     Jasmine [name] 14/78/2956 08:58:50 AM > Corson,Danielle 06/29/2012 10:38:22 AM > Pt is aware.    LAB: CBC with Diff Normal    WBC 7.4 4.0-11.0 - K/ul    RBC 4.55 4.20-5.40 - [name]/uL    HGB 13.0 12.0-16.0 - g/dL    HCT 21.3 08.6-57.8 - %    MCH 28.5 27.0-33.0 - pg    MPV 7.2 7.5-10.7 - fL L   MCV 89.1 81.0-99.0 - fL    MCHC 32.0 32.0-36.0 - g/dL    RDW 46.9 62.9-52.8 - %    NRBC# 0.00 -    PLT 221 150-400 - K/uL    NEUT % 67.2 43.3-71.9 - %    NRBC% 0.00 - %    LYMPH% 20.6 16.8-43.5 - %    MONO % 8.6 4.6-12.4 - %    EOS % 2.8 0.0-7.8 - %    BASO % 0.8 0.0-1.0 - %    NEUT # 5.0 1.9-7.2 - K/uL    LYMPH# 1.50 1.10-2.70 - K/uL    MONO # 0.6 0.3-0.8 - K/uL    EOS # 0.2 0.0-0.6 - K/uL    BASO # 0.1 0.0-0.1 - K/uL     Jasmine [name] 06/29/2012 08:58:30 AM > Corson,Danielle 06/29/2012  10:38:33 AM > Pt is aware.    Diagnostic Imaging:EKG vpaced, Corson,Danielle 06/28/2012 10:08:34 AM > [name] 06/28/2012 10:24:23 AM >  Her INR's have been therapuetic for 4 weeks so I will set her up for DCCV next week.       2. Anticoagulant monitoring  The patient's PT/INR will be checked in clininc today and anticoagulation adjusted as needed and will be reviewed by me. Please refer to coumadin clinic note.       3. Benign hypertensive heart disease without heart failure Continue Lisinopril Tablet, 20 MG, 1 tablet, Orally, once a day ; Continue Amlodipine Besylate Tablet, 10 MG, 1/2 tablet, Orally, once a day ; Continue Toprol XL Tablet Extended Release 24 Hour, 25 MG, 1 tablet, Orally, Once a day .       4. Mitral Regurgitation Continue Lasix Tablet, 20 MG, 1 tablet, Orally, once a day except monday and friday to take 2 tablets .        Procedures:  Venipuncture:  Venipuncture: Howell,Kathleen 06/28/2012 05:59:43 PM > , performed in right arm.        Immunizations:        Labs:        Procedure Codes: 30865 EKG I AND R, 80048 ECL BMP, 85025 ECL CBC PLATELET DIFF, 78469 BLOOD COLLECTION ROUTINE VENIPUNCTURE       Preventive:         Follow Up: 6 Months (Reason: afib/PVC/HTN/MR)      Provider: Armanda Magic, MD  Patient: Jasmine [name], Jasmine [name] DOB: [birth year] Date: 06/28/2012

## 2012-07-04 NOTE — Transfer of Care (Signed)
Immediate Anesthesia Transfer of Care Note  Patient: Jasmine [name]  Procedure(s) Performed: Procedure(s) (LRB) with comments: CARDIOVERSION (N/A) - h/p in file drawer   Patient Location: Endoscopy Unit  Anesthesia Type:MAC  Level of Consciousness: awake, alert  and oriented  Airway & Oxygen Therapy: Patient Spontanous Breathing and Patient connected to nasal cannula oxygen  Post-op Assessment: Report given to PACU RN, Post -op Vital signs reviewed and stable and Patient moving all extremities  Post vital signs: Reviewed and stable  Complications: No apparent anesthesia complications

## 2012-07-04 NOTE — Interval H&P Note (Signed)
History and Physical Interval Note:  07/04/2012 9:20 AM  Jasmine [name]  has presented today for surgery, with the diagnosis of afib  The various methods of treatment have been discussed with the patient and family. After consideration of risks, benefits and other options for treatment, the patient has consented to  Procedure(s) (LRB) with comments: CARDIOVERSION (N/A) - h/p in file drawer  as a surgical intervention .  The patient's history has been reviewed, patient examined, no change in status, stable for surgery.  I have reviewed the patient's chart and labs.  Questions were answered to the patient's satisfaction.     TURNER,TRACI R

## 2012-07-04 NOTE — Preoperative (Signed)
Beta Blockers   Reason not to administer Beta Blockers:Not Applicable 

## 2012-07-04 NOTE — Anesthesia Preprocedure Evaluation (Addendum)
Anesthesia Evaluation  Patient identified by MRN, date of birth, ID band Patient awake    Reviewed: Allergy & Precautions, H&P , NPO status , Patient's Chart, lab work & pertinent test results, reviewed documented beta blocker date and time   History of Anesthesia Complications Negative for: history of anesthetic complications  Airway Mallampati: II TM Distance: >3 FB Neck ROM: Full    Dental  (+) Partial Lower and Upper Dentures   Pulmonary shortness of breath, with exertion, at rest and lying, former smoker,  breath sounds clear to auscultation        Cardiovascular hypertension, Pt. on medications and Pt. on home beta blockers + angina at rest and with exertion + CAD + dysrhythmias Atrial Fibrillation + pacemaker + Valvular Problems/Murmurs AS Rhythm:Irregular Rate:Abnormal  IHSS  05/29/2012 Echo:   EF = 35-40%.   Prosthetic mitral valve = 2007 Mild Tricuspid Regurgitation Mild pulmonic insufficiency   Neuro/Psych negative neurological ROS     GI/Hepatic GERD-  Medicated and Controlled,Diverticulosis   Endo/Other  negative endocrine ROS  Renal/GU Renal diseaseChronic kidney disease     Musculoskeletal negative musculoskeletal ROS (+)   Abdominal Normal abdominal exam  (+)   Peds  Hematology negative hematology ROS (+)   Anesthesia Other Findings   Reproductive/Obstetrics                           Anesthesia Physical Anesthesia Plan  ASA: III  Anesthesia Plan: MAC   Post-op Pain Management:    Induction: Intravenous  Airway Management Planned: Simple Face Mask  Additional Equipment:   Intra-op Plan:   Post-operative Plan:   Informed Consent: I have reviewed the patients History and Physical, chart, labs and discussed the procedure including the risks, benefits and alternatives for the proposed anesthesia with the patient or authorized representative who has indicated  his/her understanding and acceptance.   Dental advisory given and Consent reviewed with POA  Plan Discussed with: CRNA and Anesthesiologist  Anesthesia Plan Comments:         Anesthesia Quick Evaluation

## 2012-07-05 ENCOUNTER — Encounter (HOSPITAL_COMMUNITY): Payer: Self-pay | Admitting: Cardiology

## 2012-07-08 ENCOUNTER — Emergency Department (HOSPITAL_COMMUNITY)
Admission: EM | Admit: 2012-07-08 | Discharge: 2012-07-08 | Disposition: A | Discharge status: Home / Self Care | Payer: Medicare PPO | Attending: Emergency Medicine | Admitting: Emergency Medicine

## 2012-07-08 ENCOUNTER — Emergency Department (HOSPITAL_COMMUNITY): Payer: Medicare PPO

## 2012-07-08 ENCOUNTER — Encounter (HOSPITAL_COMMUNITY): Payer: Self-pay | Admitting: Emergency Medicine

## 2012-07-08 DIAGNOSIS — H409 Unspecified glaucoma: Secondary | ICD-10-CM | POA: Insufficient documentation

## 2012-07-08 DIAGNOSIS — N390 Urinary tract infection, site not specified: Secondary | ICD-10-CM

## 2012-07-08 DIAGNOSIS — E7800 Pure hypercholesterolemia, unspecified: Secondary | ICD-10-CM | POA: Insufficient documentation

## 2012-07-08 DIAGNOSIS — Z87891 Personal history of nicotine dependence: Secondary | ICD-10-CM | POA: Insufficient documentation

## 2012-07-08 DIAGNOSIS — R011 Cardiac murmur, unspecified: Secondary | ICD-10-CM | POA: Insufficient documentation

## 2012-07-08 DIAGNOSIS — Z7901 Long term (current) use of anticoagulants: Secondary | ICD-10-CM | POA: Insufficient documentation

## 2012-07-08 DIAGNOSIS — I129 Hypertensive chronic kidney disease with stage 1 through stage 4 chronic kidney disease, or unspecified chronic kidney disease: Secondary | ICD-10-CM | POA: Insufficient documentation

## 2012-07-08 DIAGNOSIS — R071 Chest pain on breathing: Secondary | ICD-10-CM | POA: Insufficient documentation

## 2012-07-08 DIAGNOSIS — Z79899 Other long term (current) drug therapy: Secondary | ICD-10-CM | POA: Insufficient documentation

## 2012-07-08 DIAGNOSIS — G4700 Insomnia, unspecified: Secondary | ICD-10-CM | POA: Insufficient documentation

## 2012-07-08 DIAGNOSIS — Z8679 Personal history of other diseases of the circulatory system: Secondary | ICD-10-CM | POA: Insufficient documentation

## 2012-07-08 DIAGNOSIS — Z872 Personal history of diseases of the skin and subcutaneous tissue: Secondary | ICD-10-CM | POA: Insufficient documentation

## 2012-07-08 DIAGNOSIS — R339 Retention of urine, unspecified: Secondary | ICD-10-CM | POA: Insufficient documentation

## 2012-07-08 DIAGNOSIS — N183 Chronic kidney disease, stage 3 unspecified: Secondary | ICD-10-CM | POA: Insufficient documentation

## 2012-07-08 DIAGNOSIS — I2510 Atherosclerotic heart disease of native coronary artery without angina pectoris: Secondary | ICD-10-CM | POA: Insufficient documentation

## 2012-07-08 DIAGNOSIS — K219 Gastro-esophageal reflux disease without esophagitis: Secondary | ICD-10-CM | POA: Insufficient documentation

## 2012-07-08 DIAGNOSIS — R0789 Other chest pain: Secondary | ICD-10-CM

## 2012-07-08 LAB — URINE MICROSCOPIC-ADD ON

## 2012-07-08 LAB — URINALYSIS, ROUTINE W REFLEX MICROSCOPIC
Bilirubin Urine: NEGATIVE
Glucose, UA: NEGATIVE mg/dL
Hgb urine dipstick: NEGATIVE
Specific Gravity, Urine: 1.008 (ref 1.005–1.030)
Urobilinogen, UA: 0.2 mg/dL (ref 0.0–1.0)
pH: 6 (ref 5.0–8.0)

## 2012-07-08 LAB — CBC WITH DIFFERENTIAL/PLATELET
Basophils Absolute: 0 10*3/uL (ref 0.0–0.1)
Eosinophils Absolute: 0.2 10*3/uL (ref 0.0–0.7)
Eosinophils Relative: 2 % (ref 0–5)
Lymphocytes Relative: 23 % (ref 12–46)
Lymphs Abs: 1.7 10*3/uL (ref 0.7–4.0)
MCH: 29.8 pg (ref 26.0–34.0)
MCV: 89.6 fL (ref 78.0–100.0)
Neutrophils Relative %: 65 % (ref 43–77)
Platelets: 240 10*3/uL (ref 150–400)
RBC: 4.53 MIL/uL (ref 3.87–5.11)
RDW: 13.8 % (ref 11.5–15.5)
WBC: 7.3 10*3/uL (ref 4.0–10.5)

## 2012-07-08 LAB — COMPREHENSIVE METABOLIC PANEL
ALT: 17 U/L (ref 0–35)
AST: 27 U/L (ref 0–37)
Albumin: 4.1 g/dL (ref 3.5–5.2)
Alkaline Phosphatase: 56 U/L (ref 39–117)
Calcium: 9.4 mg/dL (ref 8.4–10.5)
Glucose, Bld: 91 mg/dL (ref 70–99)
Potassium: 4.1 mEq/L (ref 3.5–5.1)
Sodium: 139 mEq/L (ref 135–145)
Total Protein: 8 g/dL (ref 6.0–8.3)

## 2012-07-08 LAB — PROTIME-INR: Prothrombin Time: 20.7 seconds — ABNORMAL HIGH (ref 11.6–15.2)

## 2012-07-08 LAB — LACTIC ACID, PLASMA: Lactic Acid, Venous: 1.5 mmol/L (ref 0.5–2.2)

## 2012-07-08 LAB — LIPASE, BLOOD: Lipase: 28 U/L (ref 11–59)

## 2012-07-08 MED ORDER — CEFTRIAXONE SODIUM 1 G IJ SOLR
1.0000 g | Freq: Once | INTRAMUSCULAR | Status: AC
  Administered 2012-07-08: 1 g via INTRAMUSCULAR
  Filled 2012-07-08: qty 10

## 2012-07-08 MED ORDER — IOHEXOL 300 MG/ML  SOLN
50.0000 mL | Freq: Once | INTRAMUSCULAR | Status: AC | PRN
  Administered 2012-07-08: 50 mL via ORAL

## 2012-07-08 MED ORDER — CEPHALEXIN 500 MG PO CAPS: 500.0000 mg | ORAL_CAPSULE | Freq: Four times a day (QID) | ORAL | Status: DC

## 2012-07-08 MED ORDER — DEXTROSE 5 % IV SOLN
1.0000 g | Freq: Once | INTRAVENOUS | Status: DC
  Filled 2012-07-08: qty 10

## 2012-07-08 NOTE — ED Notes (Signed)
Pt ambulatory but requested wheelchair to leave ED. Pt denies chest pain upon d/c from ED. Pt denies n/v/d. Pt given d/c teaching and prescriptions. Pt verbalized understanding of teaching. Pt educated on importance of follow-up care and s/s to return to ED. Pt has no further questions upon d/c. Pt does not appear to be in acute distress upon d/c.

## 2012-07-08 NOTE — ED Notes (Signed)
IV team at bedside 

## 2012-07-08 NOTE — ED Provider Notes (Signed)
History     CSN: [id number]  Arrival date & time 07/08/12  1459   First MD Initiated Contact with Patient 07/08/12 1514      Chief Complaint  Patient presents with  . Abdominal Pain  . Urinary Retention    (Consider location/radiation/quality/duration/timing/severity/associated sxs/prior treatment) HPI Comments: Patient complains of suprapubic abdominal pain that started yesterday. She feels that she has pain with urination and is not emptying her bladder completely. She denies any nausea or vomiting or change in bowel habits. She denies any fevers or chills or cough. She also complains of intermittent chest pain at the site of skew went on her chest wall. She's not sure this pain is different than the pain she normally has regular. Notably she had cardioversion for initial fibrillation on January 28 by Dr. Mayford Knife.she denies any pain in her legs or swelling. She denies any weakness, numbness or tingling.  The history is provided by the patient and a relative.    Past Medical History  Diagnosis Date  . Hypertension   . Heart murmur     Severe MR s/p MVR  . GERD (gastroesophageal reflux disease)   . Insomnia   . Eczema   . Glaucoma(365)   . Diastolic dysfunction   . IHSS (idiopathic hypertrophic subaortic stenosis)     s/p septal myomectomy  . High cholesterol   . Atrial fibrillation and flutter     atrial fibrillation  . Paroxysmal atrial tachycardia   . Shortness of breath   . Coronary artery disease   . Blood transfusion   . Chronic kidney disease (CKD), stage III (moderate)   . Severe mitral regurgitation     Past Surgical History  Procedure Date  . Cardiac catheterization   . Cardiac valve replacement 2007    MVR  . Myomectomy 2007    septal  . Tonsillectomy   . Appendectomy   . Cholecystectomy   . Tubal ligation   . Hernia repair     "in my stomach"  . Cataract extraction w/ intraocular lens  implant, bilateral   . Cardioversion 07/04/2012    Procedure:  CARDIOVERSION;  Surgeon: Quintella Reichert, MD;  Location: [facility];  Service: Cardiovascular;  Laterality: N/A;  h/p in file drawer     History reviewed. No pertinent family history.  History  Substance Use Topics  . Smoking status: Former Smoker    Types: Cigarettes    Quit date: 06/07/1974  . Smokeless tobacco: Former Neurosurgeon    Types: Snuff     Comment: 10/05/11 "quit smoking & dipping snuff; been some years ago"  . Alcohol Use: Yes     Comment: 10/05/11 "used to drink; quit a long time ago"    OB History    Grav Para Term Preterm Abortions TAB SAB Ect Mult Living                  Review of Systems  Constitutional: Negative for fever, activity change and appetite change.  HENT: Negative for congestion and rhinorrhea.   Respiratory: Positive for chest tightness. Negative for shortness of breath.   Cardiovascular: Positive for chest pain.  Gastrointestinal: Positive for abdominal pain. Negative for nausea, vomiting and diarrhea.  Genitourinary: Negative for dysuria, hematuria, vaginal bleeding and vaginal discharge.  Musculoskeletal: Negative for back pain.  Skin: Negative for rash.  Neurological: Negative for dizziness, weakness and headaches.  A complete 10 system review of systems was obtained and all systems are negative except as noted in  the HPI and PMH.    Allergies  Cymbalta  Home Medications   Current Outpatient Rx  Name  Route  Sig  Dispense  Refill  . ACETAMINOPHEN 500 MG PO TABS   Oral   Take 500 mg by mouth every 6 (six) hours as needed. For pain         . ALBUTEROL SULFATE HFA 108 (90 BASE) MCG/ACT IN AERS   Inhalation   Inhale 2 puffs into the lungs every 4 (four) hours as needed. For shortness of breath         . AMLODIPINE BESYLATE 10 MG PO TABS   Oral   Take 5 mg by mouth daily.          Marland Kitchen CALCIUM CARBONATE-VITAMIN D 600-400 MG-UNIT PO TABS   Oral   Take 1 tablet by mouth daily.         . DEXLANSOPRAZOLE 60 MG PO CPDR   Oral   Take  60 mg by mouth daily.         . DORZOLAMIDE HCL 2 % OP SOLN   Both Eyes   Place 1 drop into both eyes 2 (two) times daily.         Marland Kitchen FLUTICASONE PROPIONATE 50 MCG/ACT NA SUSP   Nasal   Place 2 sprays into the nose daily.         . FUROSEMIDE 20 MG PO TABS   Oral   Take 20 mg by mouth daily. Take 1 tablet daily except Monday and Friday take 2 tablets         . GUAIFENESIN ER 600 MG PO TB12   Oral   Take 600 mg by mouth 2 (two) times daily as needed. For congestion         . FE BISGLYCINATE-FE POLYSAC 40-20 MG PO CAPS   Oral   Take 1 capsule by mouth daily with breakfast.         . LISINOPRIL 20 MG PO TABS   Oral   Take 20 mg by mouth daily.         Marland Kitchen LORATADINE 10 MG PO TABS   Oral   Take 10 mg by mouth daily.         Marland Kitchen LORAZEPAM 1 MG PO TABS   Oral   Take 1 mg by mouth at bedtime as needed. For sleep per patient         . LOVASTATIN 20 MG PO TABS   Oral   Take 20 mg by mouth at bedtime.         Marland Kitchen MEMANTINE HCL 10 MG PO TABS   Oral   Take 10 mg by mouth 2 (two) times daily.         Marland Kitchen METOPROLOL SUCCINATE ER 25 MG PO TB24   Oral   Take 25 mg by mouth daily.          Marland Kitchen POTASSIUM CHLORIDE ER 10 MEQ PO TBCR   Oral   Take 10 mEq by mouth daily.         . WARFARIN SODIUM 5 MG PO TABS   Oral   Take 1 tablet (5 mg total) by mouth daily. Take 1 tablet daily by mouth except take 2.5mg  daily on Monday/Wednesday and Friday         . CEPHALEXIN 500 MG PO CAPS   Oral   Take 1 capsule (500 mg total) by mouth 4 (four) times daily.   40 capsule  0     BP 145/64  Pulse 74  Temp 98.5 F (36.9 C) (Oral)  Resp 15  Ht 5\' 8"  (1.727 m)  Wt 157 lb (71.215 kg)  BMI 23.87 kg/m2  SpO2 99%  Physical Exam  Constitutional: She is oriented to person, place, and time. She appears well-developed and well-nourished. No distress.  HENT:  Head: Normocephalic and atraumatic.  Mouth/Throat: Oropharynx is clear and moist. No oropharyngeal exudate.   Eyes: Conjunctivae normal and EOM are normal. Pupils are equal, round, and reactive to light.  Neck: Normal range of motion. Neck supple.  Cardiovascular: Normal rate, regular rhythm and normal heart sounds.   No murmur heard. Pulmonary/Chest: Effort normal and breath sounds normal. No respiratory distress. She exhibits tenderness.       Multiple keloids the chest wall that are tender to palpation  Abdominal: Soft. There is tenderness. There is no rebound and no guarding.       Suprapubic tenderness  Musculoskeletal: Normal range of motion. She exhibits no edema and no tenderness.       5/5 strength in bilateral lower extremities. Ankle plantar and dorsiflexion intact. Great toe extension intact bilaterally. +2 DP and PT pulses. +2 patellar reflexes bilaterally. Normal gait.   Neurological: She is alert and oriented to person, place, and time. No cranial nerve deficit. She exhibits normal muscle tone. Coordination normal.  Skin: Skin is warm.    ED Course  Procedures (including critical care time)  Labs Reviewed  COMPREHENSIVE METABOLIC PANEL - Abnormal; Notable for the following:    GFR calc non Af Amer 49 (*)     GFR calc Af Amer 56 (*)     All other components within normal limits  URINALYSIS, ROUTINE W REFLEX MICROSCOPIC - Abnormal; Notable for the following:    Color, Urine ORANGE (*)  BIOCHEMICALS MAY BE AFFECTED BY COLOR   Nitrite POSITIVE (*)     Leukocytes, UA MODERATE (*)     All other components within normal limits  PROTIME-INR - Abnormal; Notable for the following:    Prothrombin Time 20.7 (*)     INR 1.85 (*)     All other components within normal limits  CBC WITH DIFFERENTIAL  TROPONIN I  LIPASE, BLOOD  LACTIC ACID, PLASMA  URINE MICROSCOPIC-ADD ON  TROPONIN I  URINE CULTURE   Ct Abdomen Pelvis Wo Contrast  07/08/2012  *RADIOLOGY REPORT*  Clinical Data: Abdominal pain and urinary retention. The study was ordered with contrast and IV access could not be  obtained.  Oral contrast was given.  CT ABDOMEN AND PELVIS WITHOUT CONTRAST  Technique:  Multidetector CT imaging of the abdomen and pelvis was performed following the standard protocol without intravenous contrast.  Comparison: CT abdomen pelvis without contrast 01/01/2010.  Findings: The lung bases are clear without focal nodule, mass, or airspace disease.  There is some scarring at the left lung base. The heart is mildly enlarged.  Pacing wires are in place.  The patient is status post repair of the mitral valve.  There is no edema or effusion to suggest failure.  The liver and spleen are within normal limits.  The stomach is unremarkable.  A prominent duodenal diverticulum is again noted. The pancreas is within normal limits.  Hypodense lesions of the right kidney are stable, likely representing simple cysts.  The ureters are within normal limits to the urinary bladder.  The bladder is unremarkable.  It is decompressed.  Diverticular changes are present in the descending colon.  There is no focal inflammation to suggest diverticulitis.  Additional diverticular changes are present in the ascending and transverse colon.  The appendix is not discretely visualized and may be surgically absent.  The small bowel is unremarkable.  Mild degenerative changes are present in the lower lumbar spine.  Slight anterolisthesis is present at L3-4.  Endplate changes are present at T9-10 as well.  Degenerative changes are present in the hips bilaterally, worse on the left.  IMPRESSION:  1.  Stable right to sided renal cysts. 2.  Colonic diverticulosis without diverticulitis. 3.  Prominent duodenal diverticulum without to inflammatory change. 4.  Mild cardiomegaly without failure. 5.  No acute or focal abnormality to explain the patient's symptoms. 6.  Mild spondylosis of the thoracolumbar spine.   Original Report Authenticated By: Marin Roberts, M.D.    Dg Chest 2 View  07/08/2012  *RADIOLOGY REPORT*  Clinical Data: Short  of breath, chest pain, cough, congestion  CHEST - 2 VIEW  Comparison: 02/11/2012; 12/19/2010  Findings: Unchanged cardiac silhouette and mediastinal contours post median sternotomy.  Stable positioning of support apparatus. Chronic blunting of the right costophrenic angle without definite pleural effusion. Grossly unchanged left basilar opacity favored to represent atelectasis or scar.  No new focal airspace opacities. No pneumothorax.  Unchanged bones including multilevel thoracic spine degenerative change.  Surgical clips are seen within the left upper abdomen.  IMPRESSION: No acute cardiopulmonary disease.   Original Report Authenticated By: Tacey Ruiz, MD      1. Urinary tract infection   2. Chest wall pain       MDM  Lower abdominal pain and superior pubic pain with urinary symptoms. Also chest pain with history of cheloid in recent cardioversion.  Patient's chest pain is atypical for ACS and this seems to be coming from her T waves in her chest wall. Her EKG is unchanged from her previous. She did have a cardioversion 4 days ago. Discussed with Dr. Katrinka Blazing for Dr. Mayford Knife. He agrees that patient's pain is atypical for ACS and may be related to her chest wall pain or recent cardioversion. He states recent cardioversion we'll not increased risk of ischemia. Patient had clean coronaries by cath in 2007.  Infected UA noted. Likely source of patient's suprapubic pain. Post void residual 64 ml.  UTI treated. No other evidence of acute pathology outpatient CT scan. Her chest pain is likely from her chest wall keloids and reproducible.    Date: 07/08/2012  Rate: 76  Rhythm: paced  QRS Axis: left  Intervals: normal  ST/T Wave abnormalities: nonspecific ST/T changes  Conduction Disutrbances:none  Narrative Interpretation:   Old EKG Reviewed: unchanged    Glynn Octave, MD 07/08/12 2016

## 2012-07-08 NOTE — ED Notes (Signed)
Pt denies chest pain; pt denies shortness of breath; pt denies v/d but states nausea sometimes with "mucus that comes up." Pt alert and mentating appropriately.

## 2012-07-08 NOTE — ED Notes (Signed)
IV team paged.  

## 2012-07-08 NOTE — ED Notes (Signed)
Post void residual 64 cc

## 2012-07-08 NOTE — ED Notes (Signed)
Charge Nurse interrogated pacemaker/ICD

## 2012-07-08 NOTE — ED Notes (Signed)
Pt denies chest pain; pt states she has "gas pain" but can't burp; pt denies nausea; pt alert and mentating appropriately.

## 2012-07-08 NOTE — ED Notes (Signed)
Pt returned from CT °

## 2012-07-08 NOTE — ED Notes (Signed)
Pt denies chest pain currently; pt states contrast that she drank earlier made her stomach hurt; pt taken to CT; pt alert and mentating appropriately.

## 2012-07-08 NOTE — ED Notes (Signed)
Per EMS: pt states she feels like she is not emptying her bladder all the way; pt c/o abdominal pain; bowel sounds present; BM normal today; denies complaints of gas; no rebound tenderness; VS BP: 120/60 Pulse 80 RR 20 GCS 15 CBG 78; no n/v/d; pt has keloids on chest and states pain on those; pt had bypass in 2006

## 2012-07-10 LAB — URINE CULTURE

## 2012-08-12 ENCOUNTER — Inpatient Hospital Stay (HOSPITAL_COMMUNITY)
Admission: EM | Admit: 2012-08-12 | Discharge: 2012-08-14 | DRG: 378 | Disposition: A | Discharge status: Home / Self Care | Payer: Medicare PPO | Attending: Internal Medicine | Admitting: Internal Medicine

## 2012-08-12 ENCOUNTER — Encounter (HOSPITAL_COMMUNITY): Payer: Self-pay

## 2012-08-12 DIAGNOSIS — Z79899 Other long term (current) drug therapy: Secondary | ICD-10-CM

## 2012-08-12 DIAGNOSIS — L910 Hypertrophic scar: Secondary | ICD-10-CM

## 2012-08-12 DIAGNOSIS — I509 Heart failure, unspecified: Secondary | ICD-10-CM | POA: Diagnosis present

## 2012-08-12 DIAGNOSIS — Z7901 Long term (current) use of anticoagulants: Secondary | ICD-10-CM

## 2012-08-12 DIAGNOSIS — I4510 Unspecified right bundle-branch block: Secondary | ICD-10-CM

## 2012-08-12 DIAGNOSIS — D62 Acute posthemorrhagic anemia: Secondary | ICD-10-CM

## 2012-08-12 DIAGNOSIS — I498 Other specified cardiac arrhythmias: Secondary | ICD-10-CM | POA: Diagnosis present

## 2012-08-12 DIAGNOSIS — I2510 Atherosclerotic heart disease of native coronary artery without angina pectoris: Secondary | ICD-10-CM | POA: Diagnosis present

## 2012-08-12 DIAGNOSIS — K219 Gastro-esophageal reflux disease without esophagitis: Secondary | ICD-10-CM | POA: Diagnosis present

## 2012-08-12 DIAGNOSIS — Z823 Family history of stroke: Secondary | ICD-10-CM

## 2012-08-12 DIAGNOSIS — I5032 Chronic diastolic (congestive) heart failure: Secondary | ICD-10-CM | POA: Diagnosis present

## 2012-08-12 DIAGNOSIS — I129 Hypertensive chronic kidney disease with stage 1 through stage 4 chronic kidney disease, or unspecified chronic kidney disease: Secondary | ICD-10-CM | POA: Diagnosis present

## 2012-08-12 DIAGNOSIS — D126 Benign neoplasm of colon, unspecified: Secondary | ICD-10-CM

## 2012-08-12 DIAGNOSIS — Z87891 Personal history of nicotine dependence: Secondary | ICD-10-CM

## 2012-08-12 DIAGNOSIS — Z8249 Family history of ischemic heart disease and other diseases of the circulatory system: Secondary | ICD-10-CM

## 2012-08-12 DIAGNOSIS — E7800 Pure hypercholesterolemia, unspecified: Secondary | ICD-10-CM | POA: Diagnosis present

## 2012-08-12 DIAGNOSIS — Z953 Presence of xenogenic heart valve: Secondary | ICD-10-CM

## 2012-08-12 DIAGNOSIS — I340 Nonrheumatic mitral (valve) insufficiency: Secondary | ICD-10-CM

## 2012-08-12 DIAGNOSIS — H409 Unspecified glaucoma: Secondary | ICD-10-CM | POA: Diagnosis present

## 2012-08-12 DIAGNOSIS — I4891 Unspecified atrial fibrillation: Secondary | ICD-10-CM

## 2012-08-12 DIAGNOSIS — K5731 Diverticulosis of large intestine without perforation or abscess with bleeding: Principal | ICD-10-CM

## 2012-08-12 DIAGNOSIS — K625 Hemorrhage of anus and rectum: Secondary | ICD-10-CM

## 2012-08-12 DIAGNOSIS — Z950 Presence of cardiac pacemaker: Secondary | ICD-10-CM

## 2012-08-12 DIAGNOSIS — N183 Chronic kidney disease, stage 3 unspecified: Secondary | ICD-10-CM | POA: Diagnosis present

## 2012-08-12 DIAGNOSIS — Z833 Family history of diabetes mellitus: Secondary | ICD-10-CM

## 2012-08-12 DIAGNOSIS — K922 Gastrointestinal hemorrhage, unspecified: Secondary | ICD-10-CM

## 2012-08-12 DIAGNOSIS — Z952 Presence of prosthetic heart valve: Secondary | ICD-10-CM

## 2012-08-12 HISTORY — DX: Diverticulosis of large intestine without perforation or abscess with bleeding: K57.31

## 2012-08-12 LAB — CBC
HCT: 31.6 % — ABNORMAL LOW (ref 36.0–46.0)
Hemoglobin: 10.6 g/dL — ABNORMAL LOW (ref 12.0–15.0)
MCH: 29.9 pg (ref 26.0–34.0)
MCHC: 33.5 g/dL (ref 30.0–36.0)
MCV: 89.3 fL (ref 78.0–100.0)
RBC: 3.54 MIL/uL — ABNORMAL LOW (ref 3.87–5.11)

## 2012-08-12 LAB — URINALYSIS, ROUTINE W REFLEX MICROSCOPIC
Bilirubin Urine: NEGATIVE
Glucose, UA: NEGATIVE mg/dL
Hgb urine dipstick: NEGATIVE
Ketones, ur: NEGATIVE mg/dL
Protein, ur: NEGATIVE mg/dL
pH: 6.5 (ref 5.0–8.0)

## 2012-08-12 LAB — LACTIC ACID, PLASMA: Lactic Acid, Venous: 0.5 mmol/L (ref 0.5–2.2)

## 2012-08-12 LAB — TYPE AND SCREEN
ABO/RH(D): O POS
Antibody Screen: NEGATIVE

## 2012-08-12 LAB — COMPREHENSIVE METABOLIC PANEL
ALT: 15 U/L (ref 0–35)
AST: 35 U/L (ref 0–37)
Albumin: 3.3 g/dL — ABNORMAL LOW (ref 3.5–5.2)
CO2: 25 mEq/L (ref 19–32)
Calcium: 8.6 mg/dL (ref 8.4–10.5)
GFR calc non Af Amer: 50 mL/min — ABNORMAL LOW (ref >90)
Sodium: 135 mEq/L (ref 135–145)
Total Protein: 6.5 g/dL (ref 6.0–8.3)

## 2012-08-12 LAB — SAMPLE TO BLOOD BANK

## 2012-08-12 LAB — OCCULT BLOOD, POC DEVICE: Fecal Occult Bld: POSITIVE — AB

## 2012-08-12 LAB — URINE MICROSCOPIC-ADD ON

## 2012-08-12 MED ORDER — ONDANSETRON HCL 4 MG/2ML IJ SOLN: 4.0000 mg | Freq: Four times a day (QID) | INTRAMUSCULAR | Status: DC | PRN

## 2012-08-12 MED ORDER — METOCLOPRAMIDE HCL 5 MG/ML IJ SOLN
5.0000 mg | Freq: Once | INTRAMUSCULAR | Status: AC
  Administered 2012-08-13: 5 mg via INTRAVENOUS
  Filled 2012-08-12 (×2): qty 1

## 2012-08-12 MED ORDER — NITROGLYCERIN 0.4 MG SL SUBL: 0.4000 mg | SUBLINGUAL_TABLET | SUBLINGUAL | Status: DC | PRN

## 2012-08-12 MED ORDER — PEG-KCL-NACL-NASULF-NA ASC-C 100 G PO SOLR
1.0000 | Freq: Once | ORAL | Status: AC
  Administered 2012-08-12: 100 g via ORAL
  Filled 2012-08-12: qty 1

## 2012-08-12 MED ORDER — METOCLOPRAMIDE HCL 5 MG/ML IJ SOLN
5.0000 mg | Freq: Once | INTRAMUSCULAR | Status: AC
  Administered 2012-08-12: 5 mg via INTRAVENOUS
  Filled 2012-08-12: qty 1

## 2012-08-12 MED ORDER — MORPHINE SULFATE 2 MG/ML IJ SOLN: 1.0000 mg | INTRAMUSCULAR | Status: DC | PRN

## 2012-08-12 MED ORDER — DORZOLAMIDE HCL 2 % OP SOLN
1.0000 [drp] | Freq: Two times a day (BID) | OPHTHALMIC | Status: DC
  Administered 2012-08-12 – 2012-08-14 (×3): 1 [drp] via OPHTHALMIC
  Filled 2012-08-12: qty 10

## 2012-08-12 MED ORDER — ALBUTEROL SULFATE HFA 108 (90 BASE) MCG/ACT IN AERS
2.0000 | INHALATION_SPRAY | RESPIRATORY_TRACT | Status: DC | PRN
  Filled 2012-08-12: qty 6.7

## 2012-08-12 MED ORDER — PANTOPRAZOLE SODIUM 40 MG IV SOLR
40.0000 mg | Freq: Two times a day (BID) | INTRAVENOUS | Status: DC
  Administered 2012-08-12 (×2): 40 mg via INTRAVENOUS
  Filled 2012-08-12 (×4): qty 40

## 2012-08-12 MED ORDER — MEMANTINE HCL 10 MG PO TABS
10.0000 mg | ORAL_TABLET | Freq: Two times a day (BID) | ORAL | Status: DC
  Administered 2012-08-12 – 2012-08-14 (×3): 10 mg via ORAL
  Filled 2012-08-12 (×5): qty 1

## 2012-08-12 MED ORDER — LORAZEPAM 1 MG PO TABS
1.0000 mg | ORAL_TABLET | Freq: Every day | ORAL | Status: DC
  Administered 2012-08-12 – 2012-08-13 (×2): 1 mg via ORAL
  Filled 2012-08-12 (×2): qty 1

## 2012-08-12 MED ORDER — ACETAMINOPHEN 500 MG PO TABS
500.0000 mg | ORAL_TABLET | Freq: Four times a day (QID) | ORAL | Status: DC | PRN
  Filled 2012-08-12: qty 1

## 2012-08-12 MED ORDER — SODIUM CHLORIDE 0.9 % IV BOLUS (SEPSIS)
500.0000 mL | Freq: Once | INTRAVENOUS | Status: DC
  Administered 2012-08-12: 500 mL via INTRAVENOUS

## 2012-08-12 MED ORDER — FLUTICASONE PROPIONATE 50 MCG/ACT NA SUSP
2.0000 | Freq: Every day | NASAL | Status: DC | PRN
  Filled 2012-08-12: qty 16

## 2012-08-12 MED ORDER — SODIUM CHLORIDE 0.9 % IV SOLN
INTRAVENOUS | Status: DC
  Administered 2012-08-13: 40 mL/h via INTRAVENOUS

## 2012-08-12 MED ORDER — SODIUM CHLORIDE 0.9 % IV SOLN
INTRAVENOUS | Status: DC
  Administered 2012-08-12: 22:00:00 via INTRAVENOUS

## 2012-08-12 MED ORDER — HYDROCODONE-ACETAMINOPHEN 5-325 MG PO TABS
1.0000 | ORAL_TABLET | ORAL | Status: DC | PRN
  Administered 2012-08-12: 1 via ORAL
  Administered 2012-08-13: 2 via ORAL
  Filled 2012-08-12: qty 2
  Filled 2012-08-12: qty 1

## 2012-08-12 MED ORDER — SODIUM CHLORIDE 0.9 % IV SOLN: INTRAVENOUS | Status: DC

## 2012-08-12 MED ORDER — ONDANSETRON HCL 4 MG PO TABS: 4.0000 mg | ORAL_TABLET | Freq: Four times a day (QID) | ORAL | Status: DC | PRN

## 2012-08-12 NOTE — Progress Notes (Signed)
Patient arrival on 5500.  Placed on Telemetry, NSL to left a/c dry and intact.  Patient alert and oriented.  Pacemaker located in left shoulder area, keloids noted on chest from past surgery.  Patient states 2 bowel soft bowel movements today but has had loose that look very dark, no bright red seen.  States she has not had this before.  Feeling weak and came to hospital.  Placed in bed and oriented to room and surroundings.  Bonney Leitz RN

## 2012-08-12 NOTE — ED Notes (Signed)
Pt reports dark red blood in stool for 3 to 4 days.  Pt states there is blood with each BM.  Pt denies constipation, however upon questioning pt states that her BM have not been normal.  She will "move a little bit and then not move."  Pt also admits she has had to strain at times to pass BM and that at times she has a BM, but feels like there is more that won't come out.

## 2012-08-12 NOTE — ED Provider Notes (Signed)
History     CSN: [id number]  Arrival date & time 08/12/12  1046   First MD Initiated Contact with Patient 08/12/12 1317      Chief Complaint  Patient presents with  . Rectal Bleeding    (Consider location/radiation/quality/duration/timing/severity/associated sxs/prior treatment) HPI Comments: Jasmine [name] is a 79 y.o. Female who complains of rectal bleeding, for 3 or 4 days. She has several episodes each day. She denies weakness, dizziness, nausea, vomiting, fever, or chills. She is using her usual medications. There are no known modifying factors. She's not had her INR checked in several weeks. She's not been on antibiotics, recently.  Patient is a 79 y.o. female presenting with hematochezia. The history is provided by the patient.  Rectal Bleeding     Past Medical History  Diagnosis Date  . Hypertension   . Heart murmur     Severe MR s/p MVR  . GERD (gastroesophageal reflux disease)   . Insomnia   . Eczema   . Glaucoma(365)   . Diastolic dysfunction   . IHSS (idiopathic hypertrophic subaortic stenosis)     s/p septal myomectomy  . High cholesterol   . Atrial fibrillation and flutter     atrial fibrillation  . Paroxysmal atrial tachycardia   . Shortness of breath   . Coronary artery disease   . Blood transfusion   . Chronic kidney disease (CKD), stage III (moderate)   . Severe mitral regurgitation     Past Surgical History  Procedure Laterality Date  . Cardiac catheterization    . Cardiac valve replacement  2007    MVR  . Myomectomy  2007    septal  . Tonsillectomy    . Appendectomy    . Cholecystectomy    . Tubal ligation    . Hernia repair      "in my stomach"  . Cataract extraction w/ intraocular lens  implant, bilateral    . Cardioversion  07/04/2012    Procedure: CARDIOVERSION;  Surgeon: Quintella Reichert, MD;  Location: [facility];  Service: Cardiovascular;  Laterality: N/A;  h/p in file drawer   . Colonoscopy    . Colon resection      History  reviewed. No pertinent family history.  History  Substance Use Topics  . Smoking status: Former Smoker    Types: Cigarettes    Quit date: 06/07/1974  . Smokeless tobacco: Former Neurosurgeon    Types: Snuff     Comment: 10/05/11 "quit smoking & dipping snuff; been some years ago"  . Alcohol Use: Yes     Comment: 10/05/11 "used to drink; quit a long time ago"    OB History   Grav Para Term Preterm Abortions TAB SAB Ect Mult Living                  Review of Systems  Gastrointestinal: Positive for hematochezia.  All other systems reviewed and are negative.    Allergies  Cymbalta  Home Medications   No current outpatient prescriptions on file.  BP 169/57  Pulse 70  Temp(Src) 97.7 F (36.5 C) (Oral)  Resp 18  Wt 162 lb 14.7 oz (73.9 kg)  SpO2 100%  Physical Exam  Nursing note and vitals reviewed. Constitutional: She is oriented to person, place, and time. She appears well-developed and well-nourished.  HENT:  Head: Normocephalic and atraumatic.  Eyes: Conjunctivae and EOM are normal. Pupils are equal, round, and reactive to light.  Neck: Normal range of motion and phonation normal. Neck  supple.  Cardiovascular: Normal rate, regular rhythm and intact distal pulses.   Pulmonary/Chest: Effort normal and breath sounds normal. She exhibits no tenderness.  Abdominal: Soft. She exhibits no distension. There is no tenderness. There is no guarding.  Genitourinary:  Normal anus. Melena in the rectum. No rectal mass.   Musculoskeletal: Normal range of motion.  Neurological: She is alert and oriented to person, place, and time. She has normal strength. She exhibits normal muscle tone.  Skin: Skin is warm and dry.  Psychiatric: She has a normal mood and affect. Her behavior is normal. Judgment and thought content normal.    ED Course  Procedures (including critical care time)    Labs Reviewed  COMPREHENSIVE METABOLIC PANEL - Abnormal; Notable for the following:    Albumin 3.3  (*)    GFR calc non Af Amer 50 (*)    GFR calc Af Amer 58 (*)    All other components within normal limits  CBC - Abnormal; Notable for the following:    RBC 3.54 (*)    Hemoglobin 10.6 (*)    HCT 31.6 (*)    All other components within normal limits  URINALYSIS, ROUTINE W REFLEX MICROSCOPIC - Abnormal; Notable for the following:    Leukocytes, UA MODERATE (*)    All other components within normal limits  PROTIME-INR - Abnormal; Notable for the following:    Prothrombin Time 20.1 (*)    INR 1.78 (*)    All other components within normal limits  URINE MICROSCOPIC-ADD ON - Abnormal; Notable for the following:    Squamous Epithelial / LPF FEW (*)    Casts HYALINE CASTS (*)    All other components within normal limits  HEMOGLOBIN - Abnormal; Notable for the following:    Hemoglobin 10.3 (*)    All other components within normal limits  OCCULT BLOOD, POC DEVICE - Abnormal; Notable for the following:    Fecal Occult Bld POSITIVE (*)    All other components within normal limits  TROPONIN I  LACTIC ACID, PLASMA  TROPONIN I  TROPONIN I  HEMOGLOBIN  SAMPLE TO BLOOD BANK  TYPE AND SCREEN      1. Rectal bleeding   2. Acute blood loss anemia   3. Acute GI bleeding   4. Warfarin anticoagulation   5. Atrial fibrillation       MDM  Rectal bleeding with history of diverticulosis. Hemoglobin is lower than baseline. She is hemodynamically stable. She'll need to be admitted for observation. She is on Coumadin, with a therapeutic INR. Serial hemoglobins and possibly GI consultation        Flint Melter, MD 08/12/12 2037

## 2012-08-12 NOTE — ED Notes (Signed)
IV team at bedside 

## 2012-08-12 NOTE — Consult Note (Addendum)
[facility] Consultation  Requesting Cereniti Curb: Dr. Sunnie Nielsen - Hospitalists Primary Care Physician:  Hollice Espy, MD Primary Gastroenterologist:  Dr. Loreta Ave (covering for her)  Reason for Consultation:  GI Bleed  Assessment:  GI Bleed in setting of warfarin Tx for Miral valve replacement    Suspect lower bleed given NL BUN - stools dark red/melenic by report    Acute blood loss anemia    Epigastric and LLQ pain with mild tenderness LLQ    Hx colonic resection for colon polyp    Known diverticulosis     ? Diverticular bleed, ischemia, neoplasia        Recommendations: Admit, support, transfuse if Hgb lower than 8 or signs sxs warrant with higher Hgb     Hold warfarin but will need to think about anticoagulating as soon as ok with MVR      Colonoscopy +/- EGD tomorrow The risks and benefits as well as alternatives of endoscopic procedure(s) have been discussed and reviewed. All questions answered. The patient agrees to proceed.she is at increased risk of problems as she needs to be off anti-coagulation due to bleeding and for possible endoscopic Tx - as warfarin is stroke prophylaxis.    HPI: Jasmine [name] is a 79 y.o. female being admitted with with dark red blood in stools and some melena. Per patient and daughter not feeling well x 1 week - "took to bed". 3-4 days ago started passing dark blood - did not tell daughter until today. Is weak. Having epigastric and LLQ pain. Appetite is off. Prior to this was stable - with chronic alternating bowel habits and fluctuating weight. Denies any NSAIS's or medications other than prescribed. No dysphagia, nausea or vomiting.   Past Medical History  Diagnosis Date  . Hypertension   . Heart murmur     Severe MR s/p MVR  . GERD (gastroesophageal reflux disease)   . Insomnia   . Eczema   . Glaucoma(365)   . Diastolic dysfunction   . IHSS (idiopathic hypertrophic subaortic stenosis)     s/p septal myomectomy  . High  cholesterol   . Atrial fibrillation and flutter     atrial fibrillation  . Paroxysmal atrial tachycardia   . Shortness of breath   . Coronary artery disease   . Blood transfusion   . Chronic kidney disease (CKD), stage III (moderate)   . Severe mitral regurgitation   Memory disturbance/dementia by virtue of Namenda Rx  Past Surgical History  Procedure Laterality Date  . Cardiac catheterization    . Cardiac valve replacement  2007    MVR  . Myomectomy  2007    septal  . Tonsillectomy    . Appendectomy    . Cholecystectomy    . Tubal ligation    . Hernia repair      "in my stomach"  . Cataract extraction w/ intraocular lens  implant, bilateral    . Cardioversion  07/04/2012    Procedure: CARDIOVERSION;  Surgeon: Quintella Reichert, MD;  Location: [facility];  Service: Cardiovascular;  Laterality: N/A;  h/p in file drawer   . Colonoscopy    . Colon resection      Prior to Admission medications   Medication Sig Start Date End Date Taking? Authorizing Tilden Broz  acetaminophen (TYLENOL) 500 MG tablet Take 500 mg by mouth every 6 (six) hours as needed. For pain   Yes Historical Eliseo Withers, MD  albuterol (PROVENTIL HFA;VENTOLIN HFA) 108 (90 BASE) MCG/ACT inhaler Inhale 2 puffs into the  lungs every 4 (four) hours as needed. For shortness of breath   Yes Historical Kensey Luepke, MD  amLODipine (NORVASC) 10 MG tablet Take 5 mg by mouth daily.    Yes Historical Bhavin Monjaraz, MD  Calcium Carbonate-Vitamin D (CALTRATE 600+D) 600-400 MG-UNIT per tablet Take 1 tablet by mouth daily.   Yes Historical Briona Korpela, MD  cetirizine (ZYRTEC) 10 MG tablet Take 10 mg by mouth at bedtime as needed for allergies. For allergy symptoms   Yes Historical Korinne Greenstein, MD  dexlansoprazole (DEXILANT) 60 MG capsule Take 60 mg by mouth every morning.    Yes Historical Samreet Edenfield, MD  dorzolamide (TRUSOPT) 2 % ophthalmic solution Place 1 drop into both eyes 2 (two) times daily.   Yes Historical Xiong Haidar, MD  Ferrous Sulfate (IRON)  28 MG TABS Take 1 tablet by mouth daily.   Yes Historical Proctor Carriker, MD  fluticasone (FLONASE) 50 MCG/ACT nasal spray Place 2 sprays into the nose daily as needed.    Yes Historical Kiyo Heal, MD  furosemide (LASIX) 20 MG tablet Take 20-40 mg by mouth See admin instructions. Take 20mg  daily except Monday and Friday take 40mg    Yes Historical Peg Fifer, MD  guaiFENesin (MUCINEX) 600 MG 12 hr tablet Take 600 mg by mouth 2 (two) times daily as needed. For congestion   Yes Historical Donnavan Covault, MD  lisinopril (PRINIVIL,ZESTRIL) 20 MG tablet Take 20 mg by mouth daily.   Yes Historical Arelene Moroni, MD  LORazepam (ATIVAN) 1 MG tablet Take 1 mg by mouth at bedtime.    Yes Historical Meridian Scherger, MD  lovastatin (MEVACOR) 20 MG tablet Take 20 mg by mouth at bedtime.   Yes Historical Shantese Raven, MD  memantine (NAMENDA) 10 MG tablet Take 10 mg by mouth 2 (two) times daily.   Yes Historical Jousha Schwandt, MD  metoprolol succinate (TOPROL-XL) 25 MG 24 hr tablet Take 25 mg by mouth daily.  12/22/11  Yes Historical Elanora Quin, MD  potassium chloride (K-DUR) 10 MEQ tablet Take 10 mEq by mouth daily.   Yes Historical Clairessa Boulet, MD  warfarin (COUMADIN) 5 MG tablet Take 2.5-5 mg by mouth See admin instructions. Take 5mg  on Mon, Fri, Sat &  2.5mg  daily on all other days 10/08/11 10/07/12 Yes Quintella Reichert, MD    Current Facility-Administered Medications  Medication Dose Route Frequency Deem Marmol Last Rate Last Dose  . pantoprazole (PROTONIX) injection 40 mg  40 mg Intravenous Q12H Belkys A Regalado, MD      . sodium chloride 0.9 % bolus 500 mL  500 mL Intravenous Once Flint Melter, MD       Current Outpatient Prescriptions  Medication Sig Dispense Refill  . acetaminophen (TYLENOL) 500 MG tablet Take 500 mg by mouth every 6 (six) hours as needed. For pain      . albuterol (PROVENTIL HFA;VENTOLIN HFA) 108 (90 BASE) MCG/ACT inhaler Inhale 2 puffs into the lungs every 4 (four) hours as needed. For shortness of breath      . amLODipine  (NORVASC) 10 MG tablet Take 5 mg by mouth daily.       . Calcium Carbonate-Vitamin D (CALTRATE 600+D) 600-400 MG-UNIT per tablet Take 1 tablet by mouth daily.      . cetirizine (ZYRTEC) 10 MG tablet Take 10 mg by mouth at bedtime as needed for allergies. For allergy symptoms      . dexlansoprazole (DEXILANT) 60 MG capsule Take 60 mg by mouth every morning.       . dorzolamide (TRUSOPT) 2 % ophthalmic solution Place 1 drop into both  eyes 2 (two) times daily.      . Ferrous Sulfate (IRON) 28 MG TABS Take 1 tablet by mouth daily.      . fluticasone (FLONASE) 50 MCG/ACT nasal spray Place 2 sprays into the nose daily as needed.       . furosemide (LASIX) 20 MG tablet Take 20-40 mg by mouth See admin instructions. Take 20mg  daily except Monday and Friday take 40mg       . guaiFENesin (MUCINEX) 600 MG 12 hr tablet Take 600 mg by mouth 2 (two) times daily as needed. For congestion      . lisinopril (PRINIVIL,ZESTRIL) 20 MG tablet Take 20 mg by mouth daily.      Marland Kitchen LORazepam (ATIVAN) 1 MG tablet Take 1 mg by mouth at bedtime.       . lovastatin (MEVACOR) 20 MG tablet Take 20 mg by mouth at bedtime.      . memantine (NAMENDA) 10 MG tablet Take 10 mg by mouth 2 (two) times daily.      . metoprolol succinate (TOPROL-XL) 25 MG 24 hr tablet Take 25 mg by mouth daily.       . potassium chloride (K-DUR) 10 MEQ tablet Take 10 mEq by mouth daily.      Marland Kitchen warfarin (COUMADIN) 5 MG tablet Take 2.5-5 mg by mouth See admin instructions. Take 5mg  on Mon, Fri, Sat &  2.5mg  daily on all other days        Allergies as of 08/12/2012 - Review Complete 08/12/2012  Allergen Reaction Noted  . Cymbalta (duloxetine hcl)  06/29/2012    History reviewed. No pertinent family history.  History   Social History  . Marital Status: Widowed    Spouse Name: N/A    Number of Children: 3  .     Occupational History  . Retired Engineer, drilling and other jobs   Social History Main Topics  . Smoking status: Former Smoker     Types: Cigarettes    Quit date: 06/07/1974  . Smokeless tobacco: Former Neurosurgeon    Types: Snuff     Comment: 10/05/11 "quit smoking & dipping snuff; been some years ago"  . Alcohol Use: Yes     Comment: 10/05/11 "used to drink; quit a long time ago"  . Drug Use: No  . Sexually Active: No      Review of Systems: Positive for eyeglasses, able to ambulate without walker or cane, has chronic keloid irritation on chest wall All other review of systems negative except as mentioned in the HPI.  Physical Exam: Vital signs in last 24 hours: Temp:  [98 F (36.7 C)] 98 F (36.7 C) (03/08 1121) Pulse Rate:  [70-74] 70 (03/08 1700) Resp:  [11-18] 14 (03/08 1700) BP: (122-157)/(48-64) 149/62 mmHg (03/08 1700) SpO2:  [98 %-100 %] 100 % (03/08 1700)   General:   Alert,  Well-developed, well-nourished, pleasant and cooperative in NAD Eyes:  Sclera clear, no icterus.   Conjunctiva pink. Mouth:  No deformity or lesions.  Oropharynx pink & moist. Neck:  Supple; no masses or thyromegaly. Lungs:  Clear throughout to auscultation.   Heart:  Regular rate and rhythm; no murmurs heard but heart sounds distant. Abdomen:  Soft, tender in LLQ and epigastrium, mild -moderate with slight guarding. Not distended. No masses, hepatosplenomegaly or hernias noted. Low midline scar. BS + but decreased   Rectal:   Heme + dark/melenic stool by report - exam not repeated Extremities:  Without clubbing or edema. Neurologic:  Alert and  oriented x 3 Skin:  Keloid formation on sternotomy scar Lymph Nodes:  No significant cervical or supraclavicular adenopathy. Psych:  Alert and cooperative. Normal mood and affect.   Lab Results:  Recent Labs  08/12/12 1444  WBC 8.3  HGB 10.6*  HCT 31.6*  PLT 237   BMET  Recent Labs  08/12/12 1444  NA 135  K 4.8  CL 102  CO2 25  GLUCOSE 80  BUN 13  CREATININE 1.00  CALCIUM 8.6   LFT  Recent Labs  08/12/12 1444  PROT 6.5  ALBUMIN 3.3*  AST 35  ALT 15   ALKPHOS 41  BILITOT 0.3   PT/INR  Recent Labs  08/12/12 1444  LABPROT 20.1*  INR 1.78*     Previous Endoscopies: Colonoscopy 2003   - pandiverticulosis internal hemorrhoids, anastomosis ok at 15 cm    LOS: 0 days   I appreciate the opportunity to care for this patient.   @Carl  Sena Slate, MD, [facility] [phone] (pager) 08/12/2012 5:21 PM@

## 2012-08-12 NOTE — H&P (Signed)
[facility] History and Physical  TINLEY ROUGHT YQM:[id number] DOB: [birth year] DOA: 08/12/2012  Referring physician: Dr Effie Shy PCP: Hollice Espy, MD  Specialists: Dr Leone Payor Dr Loreta Ave [facility].   Chief Complaint: Blood in the stool.   HPI: Jasmine [name] is a 79 y.o. female with PMH significant for A fib on coumadin, MVP s/p repair with tissue valve 2007, Diastolic Heart Failure, IHSS s/p septal myomectomy, Symptomatic bradycardia s/p Pacemaker, diverticulosis, colectomy, who presents to ED complaining of dark blood in the stool that started 3 days prior to admission. This is accompanied by abdominal pain, cramping in nature, every time she has bowel movement. She relates abdominal pain radiates to chest. She denies vomiting, but relates nausea. Her last bloody stool was morning of admission, twice.  She denies abdominal pain at this time. She has history of GERD. She also has history of chest pain.  Patient Hb at 10, last hemoglobin per records at 20 July 2012.   Review of Systems: The patient denies anorexia, fever, weight loss,, vision loss, decreased hearing, hoarseness,  , syncope, dyspnea on exertion, peripheral edema, balance deficits, hemoptysis,  hematuria, incontinence, genital sores, muscle weakness, suspicious skin lesions, transient blindness, difficulty walking, depression, unusual weight change, abnormal bleeding, enlarged lymph nodes, angioedema, and breast masses.    Past Medical History  Diagnosis Date  . Hypertension   . Heart murmur     Severe MR s/p MVR  . GERD (gastroesophageal reflux disease)   . Insomnia   . Eczema   . Glaucoma(365)   . Diastolic dysfunction   . IHSS (idiopathic hypertrophic subaortic stenosis)     s/p septal myomectomy  . High cholesterol   . Atrial fibrillation and flutter     atrial fibrillation  . Paroxysmal atrial tachycardia   . Shortness of breath   . Coronary artery disease   . Blood transfusion   . Chronic kidney disease  (CKD), stage III (moderate)   . Severe mitral regurgitation    Past Surgical History  Procedure Laterality Date  . Cardiac catheterization    . Cardiac valve replacement  2007    MVR  . Myomectomy  2007    septal  . Tonsillectomy    . Appendectomy    . Cholecystectomy    . Tubal ligation    . Hernia repair      "in my stomach"  . Cataract extraction w/ intraocular lens  implant, bilateral    . Cardioversion  07/04/2012    Procedure: CARDIOVERSION;  Surgeon: Quintella Reichert, MD;  Location: [facility];  Service: Cardiovascular;  Laterality: N/A;  h/p in file drawer   . Colonoscopy    . Colon resection     Social History:  reports that she quit smoking about 38 years ago. Her smoking use included Cigarettes. She smoked 0.00 packs per day. She has quit using smokeless tobacco. Her smokeless tobacco use included Snuff. She reports that  drinks alcohol. She reports that she does not use illicit drugs. lives with daughter.    Allergies  Allergen Reactions  . Cymbalta (Duloxetine Hcl)     Diarrhea, weakness    Family History: Mother deceased at 70 of stroke. sibling with history of HTN and Diabetes.   Prior to Admission medications   Medication Sig Start Date End Date Taking? Authorizing Provider  acetaminophen (TYLENOL) 500 MG tablet Take 500 mg by mouth every 6 (six) hours as needed. For pain   Yes Historical Provider, MD  albuterol (PROVENTIL HFA;VENTOLIN  HFA) 108 (90 BASE) MCG/ACT inhaler Inhale 2 puffs into the lungs every 4 (four) hours as needed. For shortness of breath   Yes Historical Provider, MD  amLODipine (NORVASC) 10 MG tablet Take 5 mg by mouth daily.    Yes Historical Provider, MD  Calcium Carbonate-Vitamin D (CALTRATE 600+D) 600-400 MG-UNIT per tablet Take 1 tablet by mouth daily.   Yes Historical Provider, MD  cetirizine (ZYRTEC) 10 MG tablet Take 10 mg by mouth at bedtime as needed for allergies. For allergy symptoms   Yes Historical Provider, MD  dexlansoprazole  (DEXILANT) 60 MG capsule Take 60 mg by mouth every morning.    Yes Historical Provider, MD  dorzolamide (TRUSOPT) 2 % ophthalmic solution Place 1 drop into both eyes 2 (two) times daily.   Yes Historical Provider, MD  Ferrous Sulfate (IRON) 28 MG TABS Take 1 tablet by mouth daily.   Yes Historical Provider, MD  fluticasone (FLONASE) 50 MCG/ACT nasal spray Place 2 sprays into the nose daily as needed.    Yes Historical Provider, MD  furosemide (LASIX) 20 MG tablet Take 20-40 mg by mouth See admin instructions. Take 20mg  daily except Monday and Friday take 40mg    Yes Historical Provider, MD  guaiFENesin (MUCINEX) 600 MG 12 hr tablet Take 600 mg by mouth 2 (two) times daily as needed. For congestion   Yes Historical Provider, MD  lisinopril (PRINIVIL,ZESTRIL) 20 MG tablet Take 20 mg by mouth daily.   Yes Historical Provider, MD  LORazepam (ATIVAN) 1 MG tablet Take 1 mg by mouth at bedtime.    Yes Historical Provider, MD  lovastatin (MEVACOR) 20 MG tablet Take 20 mg by mouth at bedtime.   Yes Historical Provider, MD  memantine (NAMENDA) 10 MG tablet Take 10 mg by mouth 2 (two) times daily.   Yes Historical Provider, MD  metoprolol succinate (TOPROL-XL) 25 MG 24 hr tablet Take 25 mg by mouth daily.  12/22/11  Yes Historical Provider, MD  potassium chloride (K-DUR) 10 MEQ tablet Take 10 mEq by mouth daily.   Yes Historical Provider, MD  warfarin (COUMADIN) 5 MG tablet Take 2.5-5 mg by mouth See admin instructions. Take 5mg  on Mon, Fri, Sat &  2.5mg  daily on all other days 10/08/11 10/07/12 Yes Quintella Reichert, MD   Physical Exam: Filed Vitals:   08/12/12 1515 08/12/12 1530 08/12/12 1545 08/12/12 1615  BP: 130/54 126/52 122/48 130/63  Pulse: 70 70 72 70  Temp:      TempSrc:      Resp: 16 11 13 14   SpO2: 100% 99% 100% 100%    General Appearance:    Alert, cooperative, no distress, appears stated age  Head:    Normocephalic, without obvious abnormality, atraumatic  Eyes:    PERRL, conjunctiva/corneas  clear, EOM's intact,     Ears:    Normal TM's and external ear canals, both ears  Nose:   Nares normal, septum midline, mucosa normal, no drainage    or sinus tenderness  Throat:   Lips, mucosa, and tongue normal; teeth and gums normal  Neck:   Supple, symmetrical, trachea midline, no adenopathy;    thyroid:  no enlargement/tenderness/nodules; no carotid   bruit or JVD  Back:     Symmetric, no curvature, ROM normal, no CVA tenderness  Lungs:     Clear to auscultation bilaterally, respirations unlabored  Chest Wall:    No tenderness or deformity   Heart:    Regular rate and rhythm, S1 and S2 normal,  no murmur, rub   or gallop     Abdomen:     Soft, mild epigastric tenderness, bowel sounds active all four quadrants,    no masses, no organomegaly        Extremities:   Extremities normal, atraumatic, no cyanosis or edema  Pulses:   2+ and symmetric all extremities  Skin:   Skin color, texture, turgor normal, no rashes or lesions  Lymph nodes:   Cervical, supraclavicular, and axillary nodes normal  Neurologic:   CNII-XII intact, normal strength, sensation and reflexes    throughout    Labs on Admission:  Basic Metabolic Panel:  Recent Labs Lab 08/12/12 1444  NA 135  K 4.8  CL 102  CO2 25  GLUCOSE 80  BUN 13  CREATININE 1.00  CALCIUM 8.6   Liver Function Tests:  Recent Labs Lab 08/12/12 1444  AST 35  ALT 15  ALKPHOS 41  BILITOT 0.3  PROT 6.5  ALBUMIN 3.3*   CBC:  Recent Labs Lab 08/12/12 1444  WBC 8.3  HGB 10.6*  HCT 31.6*  MCV 89.3  PLT 237   Cardiac Enzymes: No results found for this basename: CKTOTAL, CKMB, CKMBINDEX, TROPONINI,  in the last 168 hours  BNP (last 3 results) No results found for this basename: PROBNP,  in the last 8760 hours CBG: No results found for this basename: GLUCAP,  in the last 168 hours  Radiological Exams on Admission: No results found.  EKG: Independently reviewed. Sinus, first degree block.   Assessment/Plan Active  Problems:   Atrial fibrillation   Mitral regurgitation   Acute GI bleeding   Acute blood loss anemia   1-GI Bleed: Patient describe painful hematochezia. Differential ischemic bowel, mesenteric ischemia. Also in the differentia diverticular bleed. Less likely diverticulitis, no fever, no leukocytosis. Upper source of GI bleed could be possible. I will admit patient to telemetry. She is hemodinamically stable. Will cycle HB every 8 hours. Patient need IV access. I will start Protonix 40 mg IV BID. NPO.  I will order lactic acid. Will differ ordering CT scan to GI. Dr Leone Payor consulted. I will hold Coumadin. Gentle IV fluids due to history of Heart Failure. Type and screen, transfusion as needed. Hb at 10. INR at 1.7.   2-History of A fib: Will hold coumadin in setting of GI bleed. Will need to discussed with Primary cardiologist Dr Mendel Corning the need to continue this medication. Patient will need GI evaluation also.   3-Diastolic HF/ History of MVP s/p repair with tissue valve: I will hold lasix and BP medications in setting of GI bleed. Will monitor for pulmonary edema.  4-Acute Blood loss anemia: Secondary to GI bleed. See problem #1. 5-CKD stage 3: stable.  6-Chest pain: probably related to GI problems, but due to extensive cardiac history will cycle cardiac enzymes. EKG no ST segment elevation. Nitroglycerin PRN.        Code Status: Full Code per patient wishes.  Family Communication: care discussed with daughter who was at bedside.  Disposition Plan: Expect 4 to 5 days.   Time spent: 70 minutes.   REGALADO,BELKYS [facility] Pager [phone]  If 7PM-7AM, please contact night-coverage [email or web address] Password [email or web address] 08/12/2012, 5:10 PM

## 2012-08-13 ENCOUNTER — Encounter (HOSPITAL_COMMUNITY): Payer: Self-pay | Admitting: Gastroenterology

## 2012-08-13 ENCOUNTER — Encounter (HOSPITAL_COMMUNITY)
Admission: EM | Disposition: A | Discharge status: Home / Self Care | Payer: Self-pay | Source: Home / Self Care | Attending: Family Medicine

## 2012-08-13 ENCOUNTER — Inpatient Hospital Stay (HOSPITAL_COMMUNITY): Payer: Medicare PPO

## 2012-08-13 HISTORY — PX: COLONOSCOPY: SHX5424

## 2012-08-13 LAB — HEMOGLOBIN: Hemoglobin: 10.1 g/dL — ABNORMAL LOW (ref 12.0–15.0)

## 2012-08-13 LAB — GLUCOSE, CAPILLARY: Glucose-Capillary: 108 mg/dL — ABNORMAL HIGH (ref 70–99)

## 2012-08-13 SURGERY — COLONOSCOPY
Anesthesia: Moderate Sedation

## 2012-08-13 MED ORDER — PANTOPRAZOLE SODIUM 40 MG PO TBEC
40.0000 mg | DELAYED_RELEASE_TABLET | Freq: Two times a day (BID) | ORAL | Status: DC
  Administered 2012-08-13 – 2012-08-14 (×2): 40 mg via ORAL
  Filled 2012-08-13 (×4): qty 1

## 2012-08-13 MED ORDER — AMLODIPINE BESYLATE 10 MG PO TABS
10.0000 mg | ORAL_TABLET | Freq: Every day | ORAL | Status: DC
  Administered 2012-08-14: 10 mg via ORAL
  Filled 2012-08-13: qty 1

## 2012-08-13 MED ORDER — HYDRALAZINE HCL 10 MG PO TABS
10.0000 mg | ORAL_TABLET | Freq: Four times a day (QID) | ORAL | Status: AC | PRN
  Administered 2012-08-13: 10 mg via ORAL
  Filled 2012-08-13: qty 1

## 2012-08-13 MED ORDER — WHITE PETROLATUM GEL
Status: AC
  Administered 2012-08-13: 0.2
  Filled 2012-08-13: qty 5

## 2012-08-13 MED ORDER — FENTANYL CITRATE 0.05 MG/ML IJ SOLN
INTRAMUSCULAR | Status: AC
  Filled 2012-08-13: qty 2

## 2012-08-13 MED ORDER — HYDRALAZINE HCL 10 MG PO TABS: 10.0000 mg | ORAL_TABLET | Freq: Four times a day (QID) | ORAL | Status: DC | PRN

## 2012-08-13 MED ORDER — LISINOPRIL 20 MG PO TABS
20.0000 mg | ORAL_TABLET | Freq: Every day | ORAL | Status: DC
  Administered 2012-08-14: 20 mg via ORAL
  Filled 2012-08-13: qty 1

## 2012-08-13 MED ORDER — MIDAZOLAM HCL 5 MG/5ML IJ SOLN
INTRAMUSCULAR | Status: DC | PRN
  Administered 2012-08-13: 2 mg via INTRAVENOUS
  Administered 2012-08-13: 1 mg via INTRAVENOUS
  Administered 2012-08-13: 2 mg via INTRAVENOUS

## 2012-08-13 MED ORDER — MIDAZOLAM HCL 5 MG/ML IJ SOLN
INTRAMUSCULAR | Status: AC
  Filled 2012-08-13: qty 1

## 2012-08-13 MED ORDER — METOPROLOL SUCCINATE ER 25 MG PO TB24
25.0000 mg | ORAL_TABLET | Freq: Every day | ORAL | Status: DC
  Administered 2012-08-14: 25 mg via ORAL
  Filled 2012-08-13: qty 1

## 2012-08-13 MED ORDER — FENTANYL CITRATE 0.05 MG/ML IJ SOLN
INTRAMUSCULAR | Status: DC | PRN
  Administered 2012-08-13 (×2): 25 ug via INTRAVENOUS

## 2012-08-13 NOTE — Progress Notes (Signed)
[facility] PROGRESS NOTE  Jasmine [name] ZOX:[id number] DOB: [birth year] DOA: 08/12/2012 PCP: Hollice Espy, MD  Assessment/Plan: 1-GI Bleed: Will hold coumadin and defer to GI for when to start this medication again patient has h/o of MVR. Is s/p colonoscopy with hemoclips x 2 of diverticulum in left colon. - Gi on board and managing.  Will look for GI's recommendations for discharge from their standpoint. 2-History of A fib: holding coumadin due to GI bleed, rate controlled off of rate controlling medication. 3-Diastolic HF/ History of MVP s/p repair with tissue valve: I will hold lasix and BP medications in setting of GI bleed. Will monitor for pulmonary edema.  4-Acute Blood loss anemia: Secondary to GI bleed. See problem #1.  5-CKD stage 3: stable.  6-Chest pain: probably related to GI problems, Cardiac enzymes negative - EKG no ST segment elevation. Nitroglycerin PRN.    Code Status: full Family Communication: no family at bedside. Disposition Plan: pending improvement in condition.   Consultants:  GI: Stan Head  Procedures:  colonoscopy  Antibiotics:  none  HPI/Subjective: Patient reports that she feels better. No new complaints  Objective: Filed Vitals:   08/13/12 1100 08/13/12 1110 08/13/12 1131 08/13/12 1425  BP: 116/43 119/42 122/41 155/73  Pulse:      Temp:   97.7 F (36.5 C) 97.9 F (36.6 C)  TempSrc:   Oral Oral  Resp: 16 25 18 18   Height:      Weight:      SpO2: 98% 98% 98% 100%    Intake/Output Summary (Last 24 hours) at 08/13/12 1735 Last data filed at 08/13/12 1100  Gross per 24 hour  Intake    500 ml  Output      0 ml  Net    500 ml   Filed Weights   08/12/12 1825 08/13/12 0518  Weight: 73.9 kg (162 lb 14.7 oz) 73.483 kg (162 lb)    Exam:   General:  Pt in NAD, Alert and Awake  Cardiovascular: s1 and s2 wnl, + murmur  Respiratory: CTA BL, no wheezes  Abdomen: soft, Nt, nd  Musculoskeletal: no cyanosis or clubbing.    Data Reviewed: Basic Metabolic Panel:  Recent Labs Lab 08/12/12 1444  NA 135  K 4.8  CL 102  CO2 25  GLUCOSE 80  BUN 13  CREATININE 1.00  CALCIUM 8.6   Liver Function Tests:  Recent Labs Lab 08/12/12 1444  AST 35  ALT 15  ALKPHOS 41  BILITOT 0.3  PROT 6.5  ALBUMIN 3.3*   No results found for this basename: LIPASE, AMYLASE,  in the last 168 hours No results found for this basename: AMMONIA,  in the last 168 hours CBC:  Recent Labs Lab 08/12/12 1444 08/12/12 1739 08/13/12 0015 08/13/12 1546  WBC 8.3  --   --   --   HGB 10.6* 10.3* 11.2* 10.1*  HCT 31.6*  --   --   --   MCV 89.3  --   --   --   PLT 237  --   --   --    Cardiac Enzymes:  Recent Labs Lab 08/12/12 1733 08/12/12 2233 08/13/12 0550  TROPONINI <0.30 <0.30 <0.30   BNP (last 3 results) No results found for this basename: PROBNP,  in the last 8760 hours CBG:  Recent Labs Lab 08/13/12 0010  GLUCAP 108*    No results found for this or any previous visit (from the past 240 hour(s)).   Studies: Dg Abd  1 View  08/13/2012  *RADIOLOGY REPORT*  Clinical Data: Locate clips placed colon during colonoscopy  ABDOMEN - 1 VIEW  Comparison: None.  Findings:  There are two new radiopaque structures overlying the expected location of the mid aspect of the transverse colon which likely correlated with the clips placed during recent colonoscopy.  Mild gaseous distension of the colon without definite evidence of obstruction.  Surgical clips overlying the left upper and lower abdominal quadrant.  Enteric surgical line is seen to the left lateral aspect of the L4 - L5 intervertebral disc space.  Lumbar spine degenerative change.  IMPRESSION: 1.  Two new of radiopaque linear structures overlie the expected location of the mid aspect of the transverse colon and likely correlate with the clips placed during recent colonoscopy. 2.  Moderate gaseous distension of the colon without evidence of obstruction.   Original  Report Authenticated By: Tacey Ruiz, MD     Scheduled Meds: . dorzolamide  1 drop Both Eyes BID  . LORazepam  1 mg Oral QHS  . memantine  10 mg Oral BID  . pantoprazole  40 mg Oral BID AC   Continuous Infusions: . sodium chloride 40 mL/hr (08/13/12 1438)    Active Problems:   Atrial fibrillation   Mitral regurgitation   Diverticulosis of colon with hemorrhage   Acute blood loss anemia   Warfarin anticoagulation   History of mitral valve replacement with bioprosthetic valve   Benign neoplasm of colon    Time spent: > 35 min    Penny Pia  [facility] Pager [phone]. If 7PM-7AM, please contact night-coverage at [email or web address], password [email or web address] 08/13/2012, 5:35 PM  LOS: 1 day

## 2012-08-13 NOTE — Op Note (Signed)
[facility] [address] [place], [address]   COLONOSCOPY PROCEDURE REPORT  PATIENT: Jasmine, [name]  MR#: [id number] BIRTHDATE: 02/11/1925 , 87  yrs. old GENDER: Female ENDOSCOPIST: Iva Boop, MD, [facility] REFERRED BY:   Hospitalist PROCEDURE DATE:  08/13/2012 PROCEDURE:   Colonoscopy with control of bleeding ASA CLASS:   Class III INDICATIONS:hematochezia.   GI bleed on warfarin MEDICATIONS: Fentanyl 50 mcg IV and Versed 5 mg IV  DESCRIPTION OF PROCEDURE:   After the risks benefits and alternatives of the procedure were thoroughly explained, informed consent was obtained.  A digital rectal exam revealed no abnormalities of the rectum.   The Pentax Ped Colon [id number] endoscope was introduced through the anus and advanced to the terminal ileum which was intubated for a short distance. No adverse events experienced.   The quality of the prep was adequate, using MoviPrep  The instrument was then slowly withdrawn as the colon was fully examined.      COLON FINDINGS: A fresh clot over a diverticulum was found in the left colon at about 40 cm. This was sen on second inspection of colon after careful washing and inspection. The clot was removed, revealing an ooze of blood from the base of the diverticulum. Could not see a vessel.  The opening was medium sized.  Bleeding at the site was controlled using hemoclips.  Two (2) placements were made. At base and across the diverticulum.  There was no blood loss from the maneuver.   Severe diverticulosis was noted throughout the entire examined colon.  Many brown fecaliths.  A polypoid shaped sessile polyp measuring 6 mm in size was found at the cecum.  A polypectomy was performed with a cold snare.  The resection was complete and the polyp tissue was completely retrieved.   There was evidence of a prior end-to-side colo-colonic surgical anastomosis in the sigmoid colon.   The mucosa appeared normal in the  terminal ileum, no blood.   The colon mucosa was otherwise normal. Retroflexed views revealed no abnormalities. The time to cecum=1 minutes 0 seconds.  Withdrawal time=34 minutes 0 seconds.  The scope was withdrawn and the procedure completed. COMPLICATIONS: There were no complications.  ENDOSCOPIC IMPRESSION: 1.   Diverticulum in left colon; bleeding at the site was controlled using hemoclips x 2 2.   Severe diverticulosis was noted throughout the entire examined colon 3.   Sessile polyp measuring 6 mm in size was found at the cecum; polypectomy was performed with a cold snare 4.   There was evidence of a prior colo-colonic surgical anastomosis in the sigmoid colon 5.   Normal mucosa in the terminal ileum 6.   The colon mucosa was otherwise normal  RECOMMENDATIONS: Observe, clear liquids, KUB to locate clips Leave off warfarin for now - will advise restarting of anticoagulation in follow-up - ? is it for Afib or bioprosthetic MVR?   eSigned:  Iva Boop, MD, [facility] 08/13/2012 11:03 AM   cc: Marisue Brooklyn   PATIENT NAME:  Jasmine, [name] MR#: [id number]

## 2012-08-14 ENCOUNTER — Encounter (HOSPITAL_COMMUNITY): Payer: Self-pay | Admitting: Internal Medicine

## 2012-08-14 NOTE — Evaluation (Signed)
Physical Therapy Evaluation Patient Details Name: Jasmine [name] MRN: [id number] DOB: 1924-12-02 Today's Date: 08/14/2012 Time: 4098-1191 PT Time Calculation (min): 13 min  PT Assessment / Plan / Recommendation Clinical Impression  Pt presents at baseline for gait and mobility, will have assistance from her daughter at d/c due to decreased activity tolerance.    PT Assessment  Patent does not need any further PT services    Follow Up Recommendations  No PT follow up          Equipment Recommendations  None recommended by PT          Precautions / Restrictions Restrictions Weight Bearing Restrictions: No   Pertinent Vitals/Pain No c/o pain.      Mobility  Transfers Transfers: Sit to Stand;Stand to Sit Sit to Stand: 7: Independent Stand to Sit: 7: Independent Ambulation/Gait Ambulation/Gait Assistance: 6: Modified independent (Device/Increase time) Ambulation Distance (Feet): 150 Feet Assistive device: None Ambulation/Gait Assistance Details: Slow cadence and decreased stride length, no LOB       Visit Information  Last PT Received On: 08/14/12 Assistance Needed: +1    Subjective Data  Subjective: I'm weak because I have in been in bed Patient Stated Goal: Go home   Prior Functioning  Home Living Lives With: Daughter Available Help at Discharge: Family Type of Home: House Home Access: Level entry Home Layout: One level Home Adaptive Equipment: Straight cane Prior Function Level of Independence: Independent    Cognition  Cognition Overall Cognitive Status: Appears within functional limits for tasks assessed/performed    Extremity/Trunk Assessment Right Lower Extremity Assessment RLE ROM/Strength/Tone: Within functional levels RLE Sensation: WFL - Light Touch RLE Coordination: WFL - gross/fine motor Left Lower Extremity Assessment LLE ROM/Strength/Tone: Within functional levels LLE Sensation: WFL - Light Touch LLE Coordination: WFL - gross/fine  motor   Balance Dynamic Standing Balance Dynamic Standing - Balance Support: During functional activity Dynamic Standing - Level of Assistance: 6: Modified independent (Device/Increase time)  End of Session PT - End of Session Equipment Utilized During Treatment: Gait belt Patient left: in bed;with call bell/phone within reach Nurse Communication: Mobility status       DONAWERTH,KAREN 08/14/2012, 1:27 PM

## 2012-08-14 NOTE — Discharge Summary (Signed)
Physician Discharge Summary  Patient ID: Jasmine [name] MRN: [id number] DOB/AGE: 77-Nov-1926 77 y.o.  Admit date: 08/12/2012 Discharge date: 08/14/2012  Primary Care Physician:  Hollice Espy, MD  Discharge Diagnoses:    . Atrial fibrillation . Mitral regurgitation . Diverticulosis of colon with hemorrhage . Acute blood loss anemia . Benign neoplasm of colon  Consults gastroenterology, Dr. Leone Payor  Discharge Instructions: Please HOLD coumadin until your appointment with Dr Mayford Knife (CARDIOLOGY) on 08/16/12.  Discharge Medications:   Medication List    STOP taking these medications       warfarin 5 MG tablet  Commonly known as:  COUMADIN      TAKE these medications       acetaminophen 500 MG tablet  Commonly known as:  TYLENOL  Take 500 mg by mouth every 6 (six) hours as needed. For pain     albuterol 108 (90 BASE) MCG/ACT inhaler  Commonly known as:  PROVENTIL HFA;VENTOLIN HFA  Inhale 2 puffs into the lungs every 4 (four) hours as needed. For shortness of breath     amLODipine 10 MG tablet  Commonly known as:  NORVASC  Take 5 mg by mouth daily.     CALTRATE 600+D 600-400 MG-UNIT per tablet  Generic drug:  Calcium Carbonate-Vitamin D  Take 1 tablet by mouth daily.     cetirizine 10 MG tablet  Commonly known as:  ZYRTEC  Take 10 mg by mouth at bedtime as needed for allergies. For allergy symptoms     DEXILANT 60 MG capsule  Generic drug:  dexlansoprazole  Take 60 mg by mouth every morning.     dorzolamide 2 % ophthalmic solution  Commonly known as:  TRUSOPT  Place 1 drop into both eyes 2 (two) times daily.     fluticasone 50 MCG/ACT nasal spray  Commonly known as:  FLONASE  Place 2 sprays into the nose daily as needed.     furosemide 20 MG tablet  Commonly known as:  LASIX  Take 20-40 mg by mouth See admin instructions. Take 20mg  daily except Monday and Friday take 40mg      guaiFENesin 600 MG 12 hr tablet  Commonly known as:  MUCINEX  Take 600 mg by  mouth 2 (two) times daily as needed. For congestion     Iron 28 MG Tabs  Take 1 tablet by mouth daily.     lisinopril 20 MG tablet  Commonly known as:  PRINIVIL,ZESTRIL  Take 20 mg by mouth daily.     LORazepam 1 MG tablet  Commonly known as:  ATIVAN  Take 1 mg by mouth at bedtime.     lovastatin 20 MG tablet  Commonly known as:  MEVACOR  Take 20 mg by mouth at bedtime.     memantine 10 MG tablet  Commonly known as:  NAMENDA  Take 10 mg by mouth 2 (two) times daily.     metoprolol succinate 25 MG 24 hr tablet  Commonly known as:  TOPROL-XL  Take 25 mg by mouth daily.     potassium chloride 10 MEQ tablet  Commonly known as:  K-DUR  Take 10 mEq by mouth daily.         Brief H and P: For complete details please refer to admission H and P, but in brief patient is 79 year old female with history of atrial fibrillation on Coumadin, mitral valve repair status post tissue valve 2007, history of CHF presented to ED with the daughter blood in stools that started 3 days  prior to admission. This was accompanied with abdominal pain, cramping every time she had a bowel movement. She also has a history of GERD. Patient was admitted for further workup.  Hospital Course:  Diverticular GI bleed: Patient was admitted. Coumadin was held. Gastroenterology was consulted and patient underwent colonoscopy with hemoclips x2 of diverticulum in left colon. Patient had severe diverticulosis noted throughout the entire colon, otherwise colon mucosa was normal. Patient was continued on clear liquids for another 24 hours. KUB done for the clips. Patient was advised to hold her Coumadin until she follows up with cardiology (Dr. Mayford Knife) and gastroenterology.   History of atrial fibrillation: Currently rate controlled, Coumadin on hold  History of MVP status post repair with tissue valve: Holding Coumadin secondary to GI bleed, patient has an appointment with Dr. Mayford Knife on 08/16/2012  Day of Discharge BP  142/57  Pulse 70  Temp(Src) 98.4 F (36.9 C) (Oral)  Resp 18  Ht 5' 9.6" (1.768 m)  Wt 77.565 kg (171 lb)  BMI 24.81 kg/m2  SpO2 100%  Physical Exam: General: Alert and awake oriented x3 not in any acute distress. HEENT: anicteric sclera, pupils reactive to light and accommodation CVS: S1-S2 clear no murmur rubs or gallops Chest: clear to auscultation bilaterally, no wheezing rales or rhonchi Abdomen: soft nontender, nondistended, normal bowel sounds, no organomegaly Extremities: no cyanosis, clubbing or edema noted bilaterally Neuro: Cranial nerves II-XII intact, no focal neurological deficits   The results of significant diagnostics from this hospitalization (including imaging, microbiology, ancillary and laboratory) are listed below for reference.    LAB RESULTS: Basic Metabolic Panel:  Recent Labs Lab 08/12/12 1444  NA 135  K 4.8  CL 102  CO2 25  GLUCOSE 80  BUN 13  CREATININE 1.00  CALCIUM 8.6   Liver Function Tests:  Recent Labs Lab 08/12/12 1444  AST 35  ALT 15  ALKPHOS 41  BILITOT 0.3  PROT 6.5  ALBUMIN 3.3*   CBC:  Recent Labs Lab 08/12/12 1444  08/14/12 0040 08/14/12 1305  WBC 8.3  --   --   --   HGB 10.6*  < > 9.0* 9.2*  HCT 31.6*  --   --   --   MCV 89.3  --   --   --   PLT 237  --   --   --   < > = values in this interval not displayed. Cardiac Enzymes:  Recent Labs Lab 08/12/12 2233 08/13/12 0550  TROPONINI <0.30 <0.30   BNP: No components found with this basename: POCBNP,  CBG:  Recent Labs Lab 08/13/12 0010  GLUCAP 108*    Significant Diagnostic Studies:  Dg Abd 1 View  08/13/2012  *RADIOLOGY REPORT*  Clinical Data: Locate clips placed colon during colonoscopy  ABDOMEN - 1 VIEW  Comparison: None.  Findings:  There are two new radiopaque structures overlying the expected location of the mid aspect of the transverse colon which likely correlated with the clips placed during recent colonoscopy.  Mild gaseous distension of  the colon without definite evidence of obstruction.  Surgical clips overlying the left upper and lower abdominal quadrant.  Enteric surgical line is seen to the left lateral aspect of the L4 - L5 intervertebral disc space.  Lumbar spine degenerative change.  IMPRESSION: 1.  Two new of radiopaque linear structures overlie the expected location of the mid aspect of the transverse colon and likely correlate with the clips placed during recent colonoscopy. 2.  Moderate gaseous distension of  the colon without evidence of obstruction.   Original Report Authenticated By: Tacey Ruiz, MD     2D ECHO:   Disposition and Follow-up:     Discharge Orders   Future Orders Complete By Expires     Diet - low sodium heart healthy  As directed     Discharge instructions  As directed     Comments:      Please HOLD coumadin until your appointment with Dr Mayford Knife (CARDIOLOGY) on 08/16/12.    Increase activity slowly  As directed         DISPOSITION: Home DIET: Heart healthy diet ACTIVITY: As tolerated   DISCHARGE FOLLOW-UP Follow-up Information   Follow up with Hollice Espy, MD. Schedule an appointment as soon as possible for a visit in 2 weeks. (for hospital follow-up)    Contact information:   [address] [place] [address] [phone]       Follow up with Quintella Reichert, MD On 08/16/2012. (at 11:00 am. Please HOLD coumadin until your see Dr Mayford Knife and start back per Dr Norris Cross recommendations  )    Contact information:   [address] [place] [address] [phone]       Follow up with Stan Head, MD. Schedule an appointment as soon as possible for a visit in 2 weeks. (for follow-up)    Contact information:   [address] [place] [address] [phone]       Time spent on Discharge: 35 mins  Signed:   RAI,RIPUDEEP M.D. [facility] 08/14/2012, 3:27 PM Pager: [phone]

## 2012-08-14 NOTE — Progress Notes (Signed)
Central Monitoring called to notify that patient rhythm changed and she had a nine beat run of vtach.  Patient was assessed, patient was asymptomatic c/o NO CP.  Will continue to monitor patient. Charge nurse and assigned nurse both looked at strip saw a bundle branch block, which she has a history of according to previous EKGs, and the run of vtach.  Will continue to monitor patient.

## 2012-08-14 NOTE — Care Management Note (Addendum)
    Page 1 of 1   08/14/2012     2:03:38 PM   CARE MANAGEMENT NOTE 08/14/2012  Patient:  Jasmine [name], Jasmine [name]   Account Number:  [id number]  Date Initiated:  08/13/2012  Documentation initiated by:  Letha Cape  Subjective/Objective Assessment:   dx gib  admit- lives with daughter.  pta indep.     Action/Plan:   pt eval- no pt needs   Anticipated DC Date:  08/14/2012   Anticipated DC Plan:  HOME/SELF CARE      DC Planning Services  CM consult      Choice offered to / List presented to:             Status of service:  Completed, signed off Medicare Important Message given?   (If response is "NO", the following Medicare IM given date fields will be blank) Date Medicare IM given:   Date Additional Medicare IM given:    Discharge Disposition:  HOME/SELF CARE  Per UR Regulation:  Reviewed for med. necessity/level of care/duration of stay  If discussed at Long Length of Stay Meetings, dates discussed:    Comments:  08/14/12 12:37 Letha Cape RN, BSN [phone] patient lives with daughter, pta indep.  Await pt eval, patient is for dc today, patient walked from bed to chair with out any difficulty but will await pt eval.  Patient has medication coverage and transportation.  Per physical therapy patient has no pt needs.

## 2012-08-14 NOTE — Progress Notes (Signed)
MD notified that patient is still ringing up as V-tach but still looks to be in BBB.  She also has rang up as failure to capture; patient is a-paced.  No previous documentation found stating this has happened before.  Again, asymptomatic, no CO CP.  No new orders given.  Will continue to monitor patient.

## 2012-08-14 NOTE — Progress Notes (Signed)
NURSING PROGRESS NOTE  Jasmine [name] [id number] Discharge Data: 08/14/2012 2:58 PM Attending Provider: Cathren Harsh, MD WUJ:WJXBJ,YNWGN Windell Moulding, MD     [name] to be D/C'd Home per MD order.  Discussed with the patient the After Visit Summary and all questions fully answered. All IV's discontinued with no bleeding noted. All belongings returned to patient for patient to take home.   Last Vital Signs:  Blood pressure 142/57, pulse 70, temperature 98.4 F (36.9 C), temperature source Oral, resp. rate 18, height 5' 9.6" (1.768 m), weight 77.565 kg (171 lb), SpO2 100.00%.  Discharge Medication List   Medication List    STOP taking these medications       warfarin 5 MG tablet  Commonly known as:  COUMADIN      TAKE these medications       acetaminophen 500 MG tablet  Commonly known as:  TYLENOL  Take 500 mg by mouth every 6 (six) hours as needed. For pain     albuterol 108 (90 BASE) MCG/ACT inhaler  Commonly known as:  PROVENTIL HFA;VENTOLIN HFA  Inhale 2 puffs into the lungs every 4 (four) hours as needed. For shortness of breath     amLODipine 10 MG tablet  Commonly known as:  NORVASC  Take 5 mg by mouth daily.     CALTRATE 600+D 600-400 MG-UNIT per tablet  Generic drug:  Calcium Carbonate-Vitamin D  Take 1 tablet by mouth daily.     cetirizine 10 MG tablet  Commonly known as:  ZYRTEC  Take 10 mg by mouth at bedtime as needed for allergies. For allergy symptoms     DEXILANT 60 MG capsule  Generic drug:  dexlansoprazole  Take 60 mg by mouth every morning.     dorzolamide 2 % ophthalmic solution  Commonly known as:  TRUSOPT  Place 1 drop into both eyes 2 (two) times daily.     fluticasone 50 MCG/ACT nasal spray  Commonly known as:  FLONASE  Place 2 sprays into the nose daily as needed.     furosemide 20 MG tablet  Commonly known as:  LASIX  Take 20-40 mg by mouth See admin instructions. Take 20mg  daily except Monday and Friday take 40mg      guaiFENesin  600 MG 12 hr tablet  Commonly known as:  MUCINEX  Take 600 mg by mouth 2 (two) times daily as needed. For congestion     Iron 28 MG Tabs  Take 1 tablet by mouth daily.     lisinopril 20 MG tablet  Commonly known as:  PRINIVIL,ZESTRIL  Take 20 mg by mouth daily.     LORazepam 1 MG tablet  Commonly known as:  ATIVAN  Take 1 mg by mouth at bedtime.     lovastatin 20 MG tablet  Commonly known as:  MEVACOR  Take 20 mg by mouth at bedtime.     memantine 10 MG tablet  Commonly known as:  NAMENDA  Take 10 mg by mouth 2 (two) times daily.     metoprolol succinate 25 MG 24 hr tablet  Commonly known as:  TOPROL-XL  Take 25 mg by mouth daily.     potassium chloride 10 MEQ tablet  Commonly known as:  K-DUR  Take 10 mEq by mouth daily.        Whitaker, Elmarie Mainland, RN

## 2012-08-15 ENCOUNTER — Encounter (HOSPITAL_COMMUNITY): Payer: Self-pay | Admitting: General Practice

## 2012-08-15 ENCOUNTER — Inpatient Hospital Stay (HOSPITAL_COMMUNITY)
Admission: AD | Admit: 2012-08-15 | Discharge: 2012-08-17 | DRG: 378 | Disposition: A | Discharge status: Home / Self Care | Payer: Medicare PPO | Source: Ambulatory Visit | Attending: Internal Medicine | Admitting: Internal Medicine

## 2012-08-15 DIAGNOSIS — K5731 Diverticulosis of large intestine without perforation or abscess with bleeding: Principal | ICD-10-CM

## 2012-08-15 DIAGNOSIS — Z952 Presence of prosthetic heart valve: Secondary | ICD-10-CM

## 2012-08-15 DIAGNOSIS — Z953 Presence of xenogenic heart valve: Secondary | ICD-10-CM

## 2012-08-15 DIAGNOSIS — I4891 Unspecified atrial fibrillation: Secondary | ICD-10-CM

## 2012-08-15 DIAGNOSIS — H409 Unspecified glaucoma: Secondary | ICD-10-CM | POA: Diagnosis present

## 2012-08-15 DIAGNOSIS — I509 Heart failure, unspecified: Secondary | ICD-10-CM | POA: Diagnosis present

## 2012-08-15 DIAGNOSIS — K922 Gastrointestinal hemorrhage, unspecified: Secondary | ICD-10-CM

## 2012-08-15 DIAGNOSIS — G4700 Insomnia, unspecified: Secondary | ICD-10-CM | POA: Diagnosis present

## 2012-08-15 DIAGNOSIS — I4510 Unspecified right bundle-branch block: Secondary | ICD-10-CM

## 2012-08-15 DIAGNOSIS — M810 Age-related osteoporosis without current pathological fracture: Secondary | ICD-10-CM | POA: Diagnosis present

## 2012-08-15 DIAGNOSIS — Z7901 Long term (current) use of anticoagulants: Secondary | ICD-10-CM

## 2012-08-15 DIAGNOSIS — D62 Acute posthemorrhagic anemia: Secondary | ICD-10-CM

## 2012-08-15 DIAGNOSIS — D126 Benign neoplasm of colon, unspecified: Secondary | ICD-10-CM

## 2012-08-15 DIAGNOSIS — I340 Nonrheumatic mitral (valve) insufficiency: Secondary | ICD-10-CM

## 2012-08-15 DIAGNOSIS — I129 Hypertensive chronic kidney disease with stage 1 through stage 4 chronic kidney disease, or unspecified chronic kidney disease: Secondary | ICD-10-CM | POA: Diagnosis present

## 2012-08-15 DIAGNOSIS — Z79899 Other long term (current) drug therapy: Secondary | ICD-10-CM

## 2012-08-15 DIAGNOSIS — Z87891 Personal history of nicotine dependence: Secondary | ICD-10-CM

## 2012-08-15 DIAGNOSIS — I5032 Chronic diastolic (congestive) heart failure: Secondary | ICD-10-CM | POA: Diagnosis present

## 2012-08-15 DIAGNOSIS — F411 Generalized anxiety disorder: Secondary | ICD-10-CM | POA: Diagnosis present

## 2012-08-15 DIAGNOSIS — Z23 Encounter for immunization: Secondary | ICD-10-CM

## 2012-08-15 DIAGNOSIS — N183 Chronic kidney disease, stage 3 unspecified: Secondary | ICD-10-CM | POA: Diagnosis present

## 2012-08-15 DIAGNOSIS — J309 Allergic rhinitis, unspecified: Secondary | ICD-10-CM | POA: Diagnosis present

## 2012-08-15 DIAGNOSIS — L910 Hypertrophic scar: Secondary | ICD-10-CM

## 2012-08-15 DIAGNOSIS — F0390 Unspecified dementia without behavioral disturbance: Secondary | ICD-10-CM | POA: Diagnosis present

## 2012-08-15 DIAGNOSIS — Z950 Presence of cardiac pacemaker: Secondary | ICD-10-CM

## 2012-08-15 DIAGNOSIS — K219 Gastro-esophageal reflux disease without esophagitis: Secondary | ICD-10-CM | POA: Diagnosis present

## 2012-08-15 DIAGNOSIS — I2510 Atherosclerotic heart disease of native coronary artery without angina pectoris: Secondary | ICD-10-CM | POA: Diagnosis present

## 2012-08-15 DIAGNOSIS — E7800 Pure hypercholesterolemia, unspecified: Secondary | ICD-10-CM | POA: Diagnosis present

## 2012-08-15 HISTORY — DX: Gastrointestinal hemorrhage, unspecified: K92.2

## 2012-08-15 LAB — BASIC METABOLIC PANEL
BUN: 8 mg/dL (ref 6–23)
CO2: 23 mEq/L (ref 19–32)
Chloride: 107 mEq/L (ref 96–112)
GFR calc Af Amer: 56 mL/min — ABNORMAL LOW (ref >90)
Glucose, Bld: 92 mg/dL (ref 70–99)
Potassium: 3.6 mEq/L (ref 3.5–5.1)

## 2012-08-15 LAB — CBC
HCT: 28.2 % — ABNORMAL LOW (ref 36.0–46.0)
Hemoglobin: 9.6 g/dL — ABNORMAL LOW (ref 12.0–15.0)
MCV: 86 fL (ref 78.0–100.0)
RBC: 3.28 MIL/uL — ABNORMAL LOW (ref 3.87–5.11)
WBC: 8.2 10*3/uL (ref 4.0–10.5)

## 2012-08-15 MED ORDER — ALUM & MAG HYDROXIDE-SIMETH 200-200-20 MG/5ML PO SUSP
30.0000 mL | Freq: Four times a day (QID) | ORAL | Status: DC | PRN
Start: 2012-08-15 — End: 2012-08-17

## 2012-08-15 MED ORDER — LORAZEPAM 1 MG PO TABS
1.0000 mg | ORAL_TABLET | Freq: Every day | ORAL | Status: DC
  Administered 2012-08-15 – 2012-08-16 (×2): 1 mg via ORAL
  Filled 2012-08-15 (×2): qty 1

## 2012-08-15 MED ORDER — PROMETHAZINE HCL 25 MG PO TABS: 12.5000 mg | ORAL_TABLET | Freq: Four times a day (QID) | ORAL | Status: DC | PRN

## 2012-08-15 MED ORDER — DORZOLAMIDE HCL 2 % OP SOLN
1.0000 [drp] | Freq: Two times a day (BID) | OPHTHALMIC | Status: DC
  Administered 2012-08-15 – 2012-08-17 (×4): 1 [drp] via OPHTHALMIC
  Filled 2012-08-15: qty 10

## 2012-08-15 MED ORDER — INFLUENZA VIRUS VACC SPLIT PF IM SUSP
0.5000 mL | INTRAMUSCULAR | Status: AC
  Administered 2012-08-16: 0.5 mL via INTRAMUSCULAR
  Filled 2012-08-15: qty 0.5

## 2012-08-15 MED ORDER — FLUTICASONE PROPIONATE 50 MCG/ACT NA SUSP
2.0000 | Freq: Every day | NASAL | Status: DC
  Administered 2012-08-15 – 2012-08-17 (×3): 2 via NASAL
  Filled 2012-08-15: qty 16

## 2012-08-15 MED ORDER — ALBUTEROL SULFATE HFA 108 (90 BASE) MCG/ACT IN AERS: 2.0000 | INHALATION_SPRAY | RESPIRATORY_TRACT | Status: DC | PRN

## 2012-08-15 MED ORDER — MORPHINE SULFATE 2 MG/ML IJ SOLN: 0.5000 mg | INTRAMUSCULAR | Status: DC | PRN

## 2012-08-15 MED ORDER — SODIUM CHLORIDE 0.9 % IJ SOLN
3.0000 mL | Freq: Two times a day (BID) | INTRAMUSCULAR | Status: DC
  Administered 2012-08-15 – 2012-08-17 (×4): 3 mL via INTRAVENOUS

## 2012-08-15 MED ORDER — MEMANTINE HCL 10 MG PO TABS
10.0000 mg | ORAL_TABLET | Freq: Two times a day (BID) | ORAL | Status: DC
  Administered 2012-08-15 – 2012-08-17 (×4): 10 mg via ORAL
  Filled 2012-08-15 (×5): qty 1

## 2012-08-15 NOTE — H&P (Signed)
[facility] History and Physical  TASHEA OTHMAN ZOX:[id number] DOB: 1925/02/18 DOA: 08/15/2012  Referring physician: Dr. Loreta Ave PCP: Hollice Espy, MD  Specialists: None   Chief Complaint: Recurrent GIB  HPI: Jasmine [name] is a 79 y.o. female Just discharged 03/10/2014with history of atrial fibrillation on Coumadin, mitral valve repair status post tissue valve 2007, history of CHF presented to ED with the daughter blood in stools that started 3 days prior to admission. This was accompanied with abdominal pain, cramping every time she had a bowel movement. She also has a history of GERD She underwent colonoscopy with hemoclips x2 of diverticulum in left colon. Patient had severe diverticulosis noted throughout the entire colon, otherwise colon mucosa was normal. She staretd to have bloody bowel movements again today-the blood in the stool seeme dot be both dark and bright red.  She was seen by Dr. Loreta Ave and was sent over as a direct admit She state sshe has mild cramping in the top of the sotmach wchihc seems like "gas"  No n/V She states she has been having some chest pains recently like yesterday-no radiaiton of the pain. Some mild SOB NO cough or cold or fever, no dysuria. No lightheadedness.  No rash but she thinks she has some burnign in her skin She dopes feel some bloating in her abdomen    Review of Systems:  Neg except as above  Past Medical History  Diagnosis Date  . Hypertension   . Heart murmur     Severe MR s/p MVR  . GERD (gastroesophageal reflux disease)   . Insomnia   . Eczema   . Glaucoma(365)   . Diastolic dysfunction   . IHSS (idiopathic hypertrophic subaortic stenosis)     s/p septal myomectomy  . High cholesterol   . Atrial fibrillation and flutter     atrial fibrillation  . Paroxysmal atrial tachycardia   . Shortness of breath   . Coronary artery disease   . Blood transfusion   . Chronic kidney disease (CKD), stage III (moderate)   . Severe  mitral regurgitation   . Diverticulosis of colon with hemorrhage 08/12/2012  . GI bleed    Past Surgical History  Procedure Laterality Date  . Cardiac catheterization    . Cardiac valve replacement  2007    MVR  . Myomectomy  2007    septal  . Tonsillectomy    . Appendectomy    . Cholecystectomy    . Tubal ligation    . Hernia repair      "in my stomach"  . Cataract extraction w/ intraocular lens  implant, bilateral    . Cardioversion  07/04/2012    Procedure: CARDIOVERSION;  Surgeon: Quintella Reichert, MD;  Location: [facility] ENDOSCOPY;  Service: Cardiovascular;  Laterality: N/A;  h/p in file drawer   . Colonoscopy    . Colon resection    . Colonoscopy N/A 08/13/2012    Procedure: COLONOSCOPY;  Surgeon: Iva Boop, MD;  Location: [facility] ENDOSCOPY;  Service: Endoscopy;  Laterality: N/A;   Social History:  reports that she quit smoking about 38 years ago. Her smoking use included Cigarettes. She smoked 0.00 packs per day. She has quit using smokeless tobacco. Her smokeless tobacco use included Snuff. She reports that  drinks alcohol. She reports that she does not use illicit drugs.  Allergies  Allergen Reactions  . Cymbalta (Duloxetine Hcl)     Diarrhea, weakness    History reviewed. No pertinent family history.  Prior to  Admission medications   Medication Sig Start Date End Date Taking? Authorizing Provider  acetaminophen (TYLENOL) 500 MG tablet Take 500 mg by mouth every 6 (six) hours as needed. For pain    Historical Provider, MD  albuterol (PROVENTIL HFA;VENTOLIN HFA) 108 (90 BASE) MCG/ACT inhaler Inhale 2 puffs into the lungs every 4 (four) hours as needed. For shortness of breath    Historical Provider, MD  amLODipine (NORVASC) 10 MG tablet Take 5 mg by mouth daily.     Historical Provider, MD  Calcium Carbonate-Vitamin D (CALTRATE 600+D) 600-400 MG-UNIT per tablet Take 1 tablet by mouth daily.    Historical Provider, MD  cetirizine (ZYRTEC) 10 MG tablet Take 10 mg by mouth at bedtime  as needed for allergies. For allergy symptoms    Historical Provider, MD  dexlansoprazole (DEXILANT) 60 MG capsule Take 60 mg by mouth every morning.     Historical Provider, MD  dorzolamide (TRUSOPT) 2 % ophthalmic solution Place 1 drop into both eyes 2 (two) times daily.    Historical Provider, MD  Ferrous Sulfate (IRON) 28 MG TABS Take 1 tablet by mouth daily.    Historical Provider, MD  fluticasone (FLONASE) 50 MCG/ACT nasal spray Place 2 sprays into the nose daily as needed.     Historical Provider, MD  furosemide (LASIX) 20 MG tablet Take 20-40 mg by mouth See admin instructions. Take 20mg  daily except Monday and Friday take 40mg     Historical Provider, MD  guaiFENesin (MUCINEX) 600 MG 12 hr tablet Take 600 mg by mouth 2 (two) times daily as needed. For congestion    Historical Provider, MD  lisinopril (PRINIVIL,ZESTRIL) 20 MG tablet Take 20 mg by mouth daily.    Historical Provider, MD  LORazepam (ATIVAN) 1 MG tablet Take 1 mg by mouth at bedtime.     Historical Provider, MD  lovastatin (MEVACOR) 20 MG tablet Take 20 mg by mouth at bedtime.    Historical Provider, MD  memantine (NAMENDA) 10 MG tablet Take 10 mg by mouth 2 (two) times daily.    Historical Provider, MD  metoprolol succinate (TOPROL-XL) 25 MG 24 hr tablet Take 25 mg by mouth daily.  12/22/11   Historical Provider, MD  potassium chloride (K-DUR) 10 MEQ tablet Take 10 mEq by mouth daily.    Historical Provider, MD   Physical Exam: Filed Vitals:   08/15/12 1803  BP: 153/75  Pulse: 70  Temp: 98 F (36.7 C)  TempSrc: Oral  Resp: 20  Height: 5\' 8"  (1.727 m)  Weight: 69.128 kg (152 lb 6.4 oz)  SpO2: 100%     General:  Alert pleasant oriented no apparent distress  Eyes: EOMI  ENT: no findings significant  Neck: soft supple  Cardiovascular: S1 S2 no rub or gallop  Respiratory: clinically clear  Abdomen: soft slightly tender in epigastrium/umbilical   Labs on Admission:  Basic Metabolic Panel:  Recent  Labs Lab 08/12/12 1444  NA 135  K 4.8  CL 102  CO2 25  GLUCOSE 80  BUN 13  CREATININE 1.00  CALCIUM 8.6   Liver Function Tests:  Recent Labs Lab 08/12/12 1444  AST 35  ALT 15  ALKPHOS 41  BILITOT 0.3  PROT 6.5  ALBUMIN 3.3*   No results found for this basename: LIPASE, AMYLASE,  in the last 168 hours No results found for this basename: AMMONIA,  in the last 168 hours CBC:  Recent Labs Lab 08/12/12 1444 08/12/12 1739 08/13/12 0015 08/13/12 1546 08/14/12 0040 08/14/12  1305  WBC 8.3  --   --   --   --   --   HGB 10.6* 10.3* 11.2* 10.1* 9.0* 9.2*  HCT 31.6*  --   --   --   --   --   MCV 89.3  --   --   --   --   --   PLT 237  --   --   --   --   --    Cardiac Enzymes:  Recent Labs Lab 08/12/12 1733 08/12/12 2233 08/13/12 0550  TROPONINI <0.30 <0.30 <0.30    BNP (last 3 results) No results found for this basename: PROBNP,  in the last 8760 hours CBG:  Recent Labs Lab 08/13/12 0010  GLUCAP 108*    Radiological Exams on Admission: No results found.  EKG: Independently reviewed. None performed  Assessment/Plan Active Problems:   * No active hospital problems. *   1. Recurrent diverticular bleed-this is recurred despite being off Coumadin. We'll get a CBC and a be met now and have this followed up. She was just scoped and I doubt they'll be utility in getting further scoping unless needed. She doesn't seem specifically pale and she can have a CBC done in the morning. She'll need IV fluids at about 75 cc per hour 2. History of atrial fibrillationstatus post pacemaker placement not on any rate controlling agent. We'll monitor. She'll need telemetry. 3. History of hypertension-I will hold her lisinopril and amlodipine for now. These can be reimplemented needed 4. ?CHF history-last echo noted in chart was in 2006 she sometimes take this and takes it on a very schedule. We will hold off on Lasix for now 5. Osteoporosis continue calcium Caltrate 600/400 as  an outpatient 6. Anxiety/insomnia continue Ativan 1 mg by mouth each bedtime 7. Allergic rhinitis continue fluticasone spray 2 sprays daily as needed 8. Dementia-patient is on Namenda which sometimes can cause bleeding. We will have to weigh the risks and benefits of this and potentially discontinue this has not been   Patient was admitted as a direct admit from Dr. Loreta Ave GI consult was not ordered as patient seems stable at present time i  Code Status: full (must indicate code status--if unknown or must be presumed, indicate so) Family Communication: daughter at (indicate person spoken with, if applicable, with phone number if by telephone) Disposition Plan: patient (indicate anticipated LOS)  Time spent: 34  Mahala Menghini [facility] Pager [phone]  If 7PM-7AM, please contact night-coverage [email or web address] Password [facility] 08/15/2012, 6:34 PM

## 2012-08-16 DIAGNOSIS — D62 Acute posthemorrhagic anemia: Secondary | ICD-10-CM

## 2012-08-16 DIAGNOSIS — K922 Gastrointestinal hemorrhage, unspecified: Secondary | ICD-10-CM

## 2012-08-16 DIAGNOSIS — I4891 Unspecified atrial fibrillation: Secondary | ICD-10-CM

## 2012-08-16 DIAGNOSIS — K5731 Diverticulosis of large intestine without perforation or abscess with bleeding: Principal | ICD-10-CM

## 2012-08-16 LAB — BASIC METABOLIC PANEL
BUN: 8 mg/dL (ref 6–23)
Creatinine, Ser: 0.95 mg/dL (ref 0.50–1.10)
GFR calc non Af Amer: 53 mL/min — ABNORMAL LOW (ref >90)
Glucose, Bld: 88 mg/dL (ref 70–99)
Potassium: 3.4 mEq/L — ABNORMAL LOW (ref 3.5–5.1)

## 2012-08-16 LAB — CBC
HCT: 25.5 % — ABNORMAL LOW (ref 36.0–46.0)
Hemoglobin: 8.7 g/dL — ABNORMAL LOW (ref 12.0–15.0)
MCH: 29.8 pg (ref 26.0–34.0)
MCHC: 34.1 g/dL (ref 30.0–36.0)
MCV: 87.3 fL (ref 78.0–100.0)

## 2012-08-16 MED ORDER — METOPROLOL SUCCINATE ER 25 MG PO TB24
25.0000 mg | ORAL_TABLET | Freq: Every day | ORAL | Status: DC
  Administered 2012-08-16 – 2012-08-17 (×2): 25 mg via ORAL
  Filled 2012-08-16 (×3): qty 1

## 2012-08-16 NOTE — Progress Notes (Signed)
Quick Note:  Benign adenoma Back in hospital - so will send this note to chart Repeat colonoscopy not likely needed for this polyp given age ______

## 2012-08-16 NOTE — Consult Note (Signed)
Reason for Consult: Rectal bleeding. Referring Physician: THP NELEH [name] is an 79 y.o. female.  HPI: Patient was admitted over the weekend for rectal bleeding [has been on Coumadin] and had a colonoscopy with clipping done by Dr. Leone [name] for a diverticular bleed. Discharge home on Monday 08/14/12 and had continued rectal bleeding at home. Came to my office yesterday and on DRE had bright red blood, which prompted me to send her for readmission to observe her closely and monitor her CBC's. She seems to be doing welll today. I BM with old heme in it. On a full liquid diet.    Past Medical History  Diagnosis Date  . Hypertension   . Heart murmur     Severe MR s/p MVR  . GERD (gastroesophageal reflux disease)   . Insomnia   . Eczema   . Glaucoma(365)   . Diastolic dysfunction   . IHSS (idiopathic hypertrophic subaortic stenosis)     s/p septal myomectomy  . High cholesterol   . Atrial fibrillation and flutter     atrial fibrillation  . Paroxysmal atrial tachycardia   . Shortness of breath   . Coronary artery disease   . Blood transfusion   . Chronic kidney disease (CKD), stage III (moderate)   . Severe mitral regurgitation   . Diverticulosis of colon with hemorrhage 08/12/2012  . GI bleed    Past Surgical History  Procedure Laterality Date  . Cardiac catheterization    . Cardiac valve replacement  2007    MVR  . Myomectomy  2007    septal  . Tonsillectomy    . Appendectomy    . Cholecystectomy    . Tubal ligation    . Hernia repair      "in my stomach"  . Cataract extraction w/ intraocular lens  implant, bilateral    . Cardioversion  07/04/2012    Procedure: CARDIOVERSION;  Surgeon: Jasmine Reichert, MD;  Location: [facility] ENDOSCOPY;  Service: Cardiovascular;  Laterality: N/A;  h/p in file drawer   . Colonoscopy    . Colon resection    . Colonoscopy N/A 08/13/2012    Procedure: COLONOSCOPY;  Surgeon: Jasmine Boop, MD;  Location: [facility] ENDOSCOPY;  Service: Endoscopy;  Laterality:  N/A;    History reviewed. No pertinent family history.  Social History:  reports that she quit smoking about 38 years ago. Her smoking use included Cigarettes. She smoked 0.00 packs per day. She has quit using smokeless tobacco. Her smokeless tobacco use included Snuff. She reports that  drinks alcohol. She reports that she does not use illicit drugs.  Allergies:  Allergies  Allergen Reactions  . Cymbalta (Duloxetine Hcl)     Diarrhea, weakness   Medications: I have reviewed the patient's current medications.  Results for orders placed during the hospital encounter of 08/15/12 (from the past 48 hour(s))  CBC     Status: Abnormal   Collection Time    08/15/12  8:18 PM      Result Value Range   WBC 8.2  4.0 - 10.5 K/uL   RBC 3.28 (*) 3.87 - 5.11 MIL/uL   Hemoglobin 9.6 (*) 12.0 - 15.0 g/dL   HCT 78.2 (*) 95.6 - 21.3 %   MCV 86.0  78.0 - 100.0 fL   MCH 29.3  26.0 - 34.0 pg   MCHC 34.0  30.0 - 36.0 g/dL   RDW 08.6  57.8 - 46.9 %   Platelets 197  150 - 400 K/uL  BASIC  METABOLIC PANEL     Status: Abnormal   Collection Time    08/15/12  8:18 PM      Result Value Range   Sodium 141  135 - 145 mEq/L   Potassium 3.6  3.5 - 5.1 mEq/L   Chloride 107  96 - 112 mEq/L   CO2 23  19 - 32 mEq/L   Glucose, Bld 92  70 - 99 mg/dL   BUN 8  6 - 23 mg/dL   Creatinine, Ser 0.98  0.50 - 1.10 mg/dL   Calcium 8.9  8.4 - 11.9 mg/dL   GFR calc non Af Amer 49 (*) >90 mL/min   GFR calc Af Amer 56 (*) >90 mL/min   Comment:            The eGFR has been calculated     using the CKD EPI equation.     This calculation has not been     validated in all clinical     situations.     eGFR's persistently     <90 mL/min signify     possible Chronic Kidney Disease.  CBC     Status: Abnormal   Collection Time    08/16/12  6:10 AM      Result Value Range   WBC 6.2  4.0 - 10.5 K/uL   RBC 2.92 (*) 3.87 - 5.11 MIL/uL   Hemoglobin 8.7 (*) 12.0 - 15.0 g/dL   HCT 14.7 (*) 82.9 - 56.2 %   MCV 87.3  78.0 -  100.0 fL   MCH 29.8  26.0 - 34.0 pg   MCHC 34.1  30.0 - 36.0 g/dL   RDW 13.0  86.5 - 78.4 %   Platelets 199  150 - 400 K/uL  BASIC METABOLIC PANEL     Status: Abnormal   Collection Time    08/16/12  6:10 AM      Result Value Range   Sodium 143  135 - 145 mEq/L   Potassium 3.4 (*) 3.5 - 5.1 mEq/L   Chloride 110  96 - 112 mEq/L   CO2 25  19 - 32 mEq/L   Glucose, Bld 88  70 - 99 mg/dL   BUN 8  6 - 23 mg/dL   Creatinine, Ser 6.96  0.50 - 1.10 mg/dL   Calcium 8.8  8.4 - 29.5 mg/dL   GFR calc non Af Amer 53 (*) >90 mL/min   GFR calc Af Amer 62 (*) >90 mL/min   Comment:            The eGFR has been calculated     using the CKD EPI equation.     This calculation has not been     validated in all clinical     situations.     eGFR's persistently     <90 mL/min signify     possible Chronic Kidney Disease.  PROTIME-INR     Status: Abnormal   Collection Time    08/16/12 12:20 PM      Result Value Range   Prothrombin Time 16.2 (*) 11.6 - 15.2 seconds   INR 1.33  0.00 - 1.49  TYPE AND SCREEN     Status: None   Collection Time    08/16/12  4:21 PM      Result Value Range   ABO/RH(D) O POS     Antibody Screen NEG     Sample Expiration 08/19/2012     Unit Number [id number]  Blood Component Type RED CELLS,LR     Unit division 00     Status of Unit ISSUED     Transfusion Status OK TO TRANSFUSE     Crossmatch Result Compatible    PREPARE RBC (CROSSMATCH)     Status: None   Collection Time    08/16/12  4:21 PM      Result Value Range   Order Confirmation ORDER PROCESSED BY BLOOD BANK     No results found.  Review of Systems  Constitutional: Positive for malaise/fatigue. Negative for fever, chills, weight loss and diaphoresis.  HENT: Positive for neck pain.   Eyes: Negative.   Respiratory: Negative.   Cardiovascular: Negative.   Gastrointestinal: Positive for constipation and blood in stool. Negative for heartburn, nausea, vomiting, abdominal pain and diarrhea.   Musculoskeletal: Positive for myalgias, back pain and joint pain.  Skin: Negative.   Neurological: Positive for dizziness. Negative for focal weakness, seizures and weakness.  Endo/Heme/Allergies: Negative.   Psychiatric/Behavioral: Negative for depression, suicidal ideas, hallucinations and substance abuse. The patient is nervous/anxious. The patient does not have insomnia.    Blood pressure 134/47, pulse 69, temperature 98.3 F (36.8 C), temperature source Oral, resp. rate 20, height 5\' 8"  (1.727 m), weight 69.128 kg (152 lb 6.4 oz), SpO2 100.00%. Physical Exam  Constitutional: She is oriented to person, place, and time. She appears well-developed and well-nourished.  HENT:  Head: Normocephalic and atraumatic.  Eyes: Conjunctivae and EOM are normal.  Neck: Normal range of motion. Neck supple.  Respiratory: Effort normal and breath sounds normal.  GI: Soft. Bowel sounds are normal.  Musculoskeletal: Normal range of motion.  Neurological: She is alert and oriented to person, place, and time.  Skin: Skin is warm and dry.  Psychiatric: She has a normal mood and affect. Her behavior is normal.   Assessment/Plan: 1) Rectal bleeding [diverticular bleed] with anemia: as per my discussion with Dr. Lavera Guise, plans are to transfuse her prior to discharge. I will advance her diet and see how she does tomorrow. She will need anticoagulation. Will need cardiology input. Will advance diet today. 2) Adenomatous polyp removed on colonoscopy.   MANN,JYOTHI 08/16/2012, 7:31 PM

## 2012-08-16 NOTE — Progress Notes (Signed)
[facility] PROGRESS NOTE  Jasmine [name] ZOX:[id number] DOB: 02-Aug-1924 DOA: 08/15/2012 PCP: Hollice Espy, MD  Assessment/Plan: 1. GI bleed - suspect patient is still oozing from the diverticulum identified as the source for bleeding during the previous admission. Continue to hold anticoagulation. The patient is hemodynamically stable. 2. acute blood loss anemia-transfuse one unit of packed red blood cells - followup CBC tomorrow 3. hypertension-continue to hold medications in the setting of acute bleeding 4. history of atrial fibrillation-continue to hold Coumadin 5. dementia-mild-continue Namenda  Code Status: Full code Family Communication: No family in the room (indicate person spoken with, relationship, and if by phone, the number) Disposition Plan: Home   Consultants:  Dr. Loreta Ave with gastroenterology  Procedures:  Transfusion  Antibiotics:  None (indicate start date, and stop date if known)  HPI/Subjective: Reports that the bleeding has slowed down  Objective: Filed Vitals:   08/16/12 0559 08/16/12 0603 08/16/12 0604 08/16/12 0605  BP: 112/56 118/64 124/63 121/74  Pulse: 69 69 74 78  Temp: 98.6 F (37 C)     TempSrc: Oral     Resp: 20     Height:      Weight:      SpO2: 99%       Intake/Output Summary (Last 24 hours) at 08/16/12 1020 Last data filed at 08/16/12 0900  Gross per 24 hour  Intake      0 ml  Output      0 ml  Net      0 ml   Filed Weights   08/15/12 1803  Weight: 69.128 kg (152 lb 6.4 oz)    Exam:   General:  Alert, oriented to self, in no acute distress  Cardiovascular: Regular rate and rhythm without murmurs rubs or gallops  Respiratory: Clear to auscultation without wheeze rhonchi crackles  Abdomen: Soft nontender bowel sounds are present  Musculoskeletal: No edema   Data Reviewed: Basic Metabolic Panel:  Recent Labs Lab 08/12/12 1444 08/15/12 2018 08/16/12 0610  NA 135 141 143  K 4.8 3.6 3.4*  CL 102 107  110  CO2 25 23 25   GLUCOSE 80 92 88  BUN 13 8 8   CREATININE 1.00 1.02 0.95  CALCIUM 8.6 8.9 8.8   Liver Function Tests:  Recent Labs Lab 08/12/12 1444  AST 35  ALT 15  ALKPHOS 41  BILITOT 0.3  PROT 6.5  ALBUMIN 3.3*   No results found for this basename: LIPASE, AMYLASE,  in the last 168 hours No results found for this basename: AMMONIA,  in the last 168 hours CBC:  Recent Labs Lab 08/12/12 1444  08/13/12 1546 08/14/12 0040 08/14/12 1305 08/15/12 2018 08/16/12 0610  WBC 8.3  --   --   --   --  8.2 6.2  HGB 10.6*  < > 10.1* 9.0* 9.2* 9.6* 8.7*  HCT 31.6*  --   --   --   --  28.2* 25.5*  MCV 89.3  --   --   --   --  86.0 87.3  PLT 237  --   --   --   --  197 199  < > = values in this interval not displayed. Cardiac Enzymes:  Recent Labs Lab 08/12/12 1733 08/12/12 2233 08/13/12 0550  TROPONINI <0.30 <0.30 <0.30   BNP (last 3 results) No results found for this basename: PROBNP,  in the last 8760 hours CBG:  Recent Labs Lab 08/13/12 0010  GLUCAP 108*    No results found  for this or any previous visit (from the past 240 hour(s)).   Studies: No results found.  Scheduled Meds: . dorzolamide  1 drop Both Eyes BID  . fluticasone  2 spray Each Nare Daily  . influenza  inactive virus vaccine  0.5 mL Intramuscular Tomorrow-1000  . LORazepam  1 mg Oral QHS  . memantine  10 mg Oral BID  . sodium chloride  3 mL Intravenous Q12H   Continuous Infusions:   Principal Problem:   GI bleed Active Problems:   RBBB   Pacemaker-[employer]   Atrial fibrillation   Mitral regurgitation   Diverticulosis of colon with hemorrhage   Acute blood loss anemia   History of mitral valve replacement with bioprosthetic valve      Jasmine [name],Jasmine [name]  [facility] Pager [phone]. If 7PM-7AM, please contact night-coverage at [email or web address], password [facility] 08/16/2012, 10:20 AM  LOS: 1 day

## 2012-08-17 LAB — TYPE AND SCREEN
ABO/RH(D): O POS
Antibody Screen: NEGATIVE

## 2012-08-17 LAB — CBC
Hemoglobin: 9.8 g/dL — ABNORMAL LOW (ref 12.0–15.0)
MCH: 30.2 pg (ref 26.0–34.0)
MCV: 87.7 fL (ref 78.0–100.0)
RBC: 3.25 MIL/uL — ABNORMAL LOW (ref 3.87–5.11)

## 2012-08-17 NOTE — Progress Notes (Signed)
Jasmine [name] discharged Home per MD order.  Discharge instructions reviewed and discussed with the patient, all questions and concerns answered. Copy of instructions and scripts given to patient.    Medication List    STOP taking these medications       furosemide 20 MG tablet  Commonly known as:  LASIX     lisinopril 20 MG tablet  Commonly known as:  PRINIVIL,ZESTRIL     potassium chloride 10 MEQ tablet  Commonly known as:  K-DUR      TAKE these medications       acetaminophen 500 MG tablet  Commonly known as:  TYLENOL  Take 500 mg by mouth every 6 (six) hours as needed. For pain     albuterol 108 (90 BASE) MCG/ACT inhaler  Commonly known as:  PROVENTIL HFA;VENTOLIN HFA  Inhale 2 puffs into the lungs every 4 (four) hours as needed. For shortness of breath     amLODipine 10 MG tablet  Commonly known as:  NORVASC  Take 5 mg by mouth daily.     CALTRATE 600+D 600-400 MG-UNIT per tablet  Generic drug:  Calcium Carbonate-Vitamin D  Take 1 tablet by mouth daily.     cetirizine 10 MG tablet  Commonly known as:  ZYRTEC  Take 10 mg by mouth at bedtime as needed for allergies. For allergy symptoms     DEXILANT 60 MG capsule  Generic drug:  dexlansoprazole  Take 60 mg by mouth every morning.     dorzolamide 2 % ophthalmic solution  Commonly known as:  TRUSOPT  Place 1 drop into both eyes 2 (two) times daily.     guaiFENesin 600 MG 12 hr tablet  Commonly known as:  MUCINEX  Take 600 mg by mouth 2 (two) times daily as needed. For congestion     Iron 28 MG Tabs  Take 1 tablet by mouth daily.     LORazepam 1 MG tablet  Commonly known as:  ATIVAN  Take 1 mg by mouth at bedtime.     lovastatin 20 MG tablet  Commonly known as:  MEVACOR  Take 20 mg by mouth at bedtime.     memantine 10 MG tablet  Commonly known as:  NAMENDA  Take 10 mg by mouth 2 (two) times daily.     metoprolol succinate 25 MG 24 hr tablet  Commonly known as:  TOPROL-XL  Take 25 mg by mouth  daily.        Patients skin is clean, dry and intact, no evidence of skin break down. IV site discontinued and catheter remains intact. Site without signs and symptoms of complications. Dressing and pressure applied.  Patient escorted to car by NT in a wheelchair,  no distress noted upon discharge.  Jasmine [name], [name] 08/17/2012 4:04 PM

## 2012-08-17 NOTE — Care Management Note (Signed)
    Page 1 of 1   08/17/2012     5:09:42 PM   CARE MANAGEMENT NOTE 08/17/2012  Patient:  Jasmine [name], Jasmine [name]   Account Number:  [id number]  Date Initiated:  08/17/2012  Documentation initiated by:  Letha Cape  Subjective/Objective Assessment:   dx gib  admit- lives with daughter.     Action/Plan:   Anticipated DC Date:  08/17/2012   Anticipated DC Plan:  HOME/SELF CARE      DC Planning Services  CM consult      Choice offered to / List presented to:             Status of service:  Completed, signed off Medicare Important Message given?   (If response is "NO", the following Medicare IM given date fields will be blank) Date Medicare IM given:   Date Additional Medicare IM given:    Discharge Disposition:  HOME/SELF CARE  Per UR Regulation:  Reviewed for med. necessity/level of care/duration of stay  If discussed at Long Length of Stay Meetings, dates discussed:    Comments:  08/17/12 17:08 Letha Cape RN, BSN [phone] patient lives with daughter, patient is for dc today, no needs anticipated.

## 2012-08-17 NOTE — Plan of Care (Signed)
Problem: Phase I Progression Outcomes Goal: Initial discharge plan identified Outcome: Completed/Met Date Met:  08/17/12 To return home with daughter

## 2012-08-17 NOTE — Discharge Summary (Signed)
Physician Discharge Summary  Jasmine [name] WUJ:[id number] DOB: 12/25/1924 DOA: 08/15/2012  PCP: Hollice Espy, MD  Admit date: 08/15/2012 Discharge date: 08/17/2012  Time spent: 35 minutes  Recommendations for Outpatient Follow-up:  1. CBC  2. patient to followup with Dr. Carolanne Grumbling for resumption of the Coumadin  Discharge Diagnoses:  GI bleed - most likely residual blood from prior active bleeding.    RBBB   Pacemaker-St.Jude   Atrial fibrillation   Mitral regurgitation   Diverticulosis of colon with hemorrhage - status post recent clipping of a bleeding diverticulum   Subacute blood loss anemia - status post transfusion of one unit of packed red blood cells   History of mitral valve replacement with bioprosthetic valve   Discharge Condition: Good  Diet recommendation: Regular  Filed Weights   08/15/12 1803  Weight: 69.128 kg (152 lb 6.4 oz)    History of present illness:  79 year old patient recently discharged from the hospital was referred back for admission for ongoing bloody bowel movements  Hospital Course:  1. GI bleed - we did suspect suspect patient was still oozing from the diverticulum identified as the source for bleeding during the previous admission. Continue to hold anticoagulation. The patient remained hemodynamically stable. After discussing the case with Dr. Loreta Ave - she is advised to release the patient from the hospital because most likely the bloody bowel movements that we are seeing or related to old blood and prior bleeding and not necessarily a new focus. The patient remained hemodynamically stable and asymptomatic prior to discharge 2.Sub acute blood loss anemia-transfused one unit of packed red blood cells -  3. hypertension-continue to hold medications in the setting of acute bleeding  4. history of atrial fibrillation-continue to hold Coumadin . Patient will followup with her primary cardiologist Dr. Carolanne Grumbling for resumption of Coumadin 5.  dementia-mild-continue Namenda   Procedures:  Transfusion of packed red blood cells  Consultations:  Dr. Loreta Ave with GI  Discharge Exam: Filed Vitals:   08/16/12 2045 08/16/12 2145 08/17/12 0503 08/17/12 1004  BP: 154/62 156/59 117/67 120/64  Pulse: 76 76 74 71  Temp: 99 F (37.2 C) 98.2 F (36.8 C) 98.6 F (37 C)   TempSrc: Oral Oral Oral   Resp: 18 20 18    Height:      Weight:      SpO2:   98%     General: Alert and oriented x3 Cardiovascular: Regular rate and rhythm without murmurs rubs or gallops Respiratory: Clear to auscultation bilaterally  Discharge Instructions  Discharge Orders   Future Orders Complete By Expires     Diet - low sodium heart healthy  As directed     Increase activity slowly  As directed         Medication List    STOP taking these medications       furosemide 20 MG tablet  Commonly known as:  LASIX     lisinopril 20 MG tablet  Commonly known as:  PRINIVIL,ZESTRIL     potassium chloride 10 MEQ tablet  Commonly known as:  K-DUR      TAKE these medications       acetaminophen 500 MG tablet  Commonly known as:  TYLENOL  Take 500 mg by mouth every 6 (six) hours as needed. For pain     albuterol 108 (90 BASE) MCG/ACT inhaler  Commonly known as:  PROVENTIL HFA;VENTOLIN HFA  Inhale 2 puffs into the lungs every 4 (four) hours as needed. For shortness of  breath     amLODipine 10 MG tablet  Commonly known as:  NORVASC  Take 5 mg by mouth daily.     CALTRATE 600+D 600-400 MG-UNIT per tablet  Generic drug:  Calcium Carbonate-Vitamin D  Take 1 tablet by mouth daily.     cetirizine 10 MG tablet  Commonly known as:  ZYRTEC  Take 10 mg by mouth at bedtime as needed for allergies. For allergy symptoms     DEXILANT 60 MG capsule  Generic drug:  dexlansoprazole  Take 60 mg by mouth every morning.     dorzolamide 2 % ophthalmic solution  Commonly known as:  TRUSOPT  Place 1 drop into both eyes 2 (two) times daily.     guaiFENesin  600 MG 12 hr tablet  Commonly known as:  MUCINEX  Take 600 mg by mouth 2 (two) times daily as needed. For congestion     Iron 28 MG Tabs  Take 1 tablet by mouth daily.     LORazepam 1 MG tablet  Commonly known as:  ATIVAN  Take 1 mg by mouth at bedtime.     lovastatin 20 MG tablet  Commonly known as:  MEVACOR  Take 20 mg by mouth at bedtime.     memantine 10 MG tablet  Commonly known as:  NAMENDA  Take 10 mg by mouth 2 (two) times daily.     metoprolol succinate 25 MG 24 hr tablet  Commonly known as:  TOPROL-XL  Take 25 mg by mouth daily.          The results of significant diagnostics from this hospitalization (including imaging, microbiology, ancillary and laboratory) are listed below for reference.    Significant Diagnostic Studies: Dg Abd 1 View  08/13/2012  *RADIOLOGY REPORT*  Clinical Data: Locate clips placed colon during colonoscopy  ABDOMEN - 1 VIEW  Comparison: None.  Findings:  There are two new radiopaque structures overlying the expected location of the mid aspect of the transverse colon which likely correlated with the clips placed during recent colonoscopy.  Mild gaseous distension of the colon without definite evidence of obstruction.  Surgical clips overlying the left upper and lower abdominal quadrant.  Enteric surgical line is seen to the left lateral aspect of the L4 - L5 intervertebral disc space.  Lumbar spine degenerative change.  IMPRESSION: 1.  Two new of radiopaque linear structures overlie the expected location of the mid aspect of the transverse colon and likely correlate with the clips placed during recent colonoscopy. 2.  Moderate gaseous distension of the colon without evidence of obstruction.   Original Report Authenticated By: Tacey Ruiz, MD     Microbiology: No results found for this or any previous visit (from the past 240 hour(s)).   Labs: Basic Metabolic Panel:  Recent Labs Lab 08/12/12 1444 08/15/12 2018 08/16/12 0610  NA 135 141  143  K 4.8 3.6 3.4*  CL 102 107 110  CO2 25 23 25   GLUCOSE 80 92 88  BUN 13 8 8   CREATININE 1.00 1.02 0.95  CALCIUM 8.6 8.9 8.8   Liver Function Tests:  Recent Labs Lab 08/12/12 1444  AST 35  ALT 15  ALKPHOS 41  BILITOT 0.3  PROT 6.5  ALBUMIN 3.3*   No results found for this basename: LIPASE, AMYLASE,  in the last 168 hours No results found for this basename: AMMONIA,  in the last 168 hours CBC:  Recent Labs Lab 08/12/12 1444  08/14/12 0040 08/14/12 1305 08/15/12 2018  08/16/12 0610 08/17/12 0455  WBC 8.3  --   --   --  8.2 6.2 6.2  HGB 10.6*  < > 9.0* 9.2* 9.6* 8.7* 9.8*  HCT 31.6*  --   --   --  28.2* 25.5* 28.5*  MCV 89.3  --   --   --  86.0 87.3 87.7  PLT 237  --   --   --  197 199 191  < > = values in this interval not displayed. Cardiac Enzymes:  Recent Labs Lab 08/12/12 1733 08/12/12 2233 08/13/12 0550  TROPONINI <0.30 <0.30 <0.30   BNP: BNP (last 3 results) No results found for this basename: PROBNP,  in the last 8760 hours CBG:  Recent Labs Lab 08/13/12 0010  GLUCAP 108*       Signed:  LAZA,SORIN  [facility] 08/17/2012, 10:42 AM

## 2013-04-10 ENCOUNTER — Ambulatory Visit (INDEPENDENT_AMBULATORY_CARE_PROVIDER_SITE_OTHER): Payer: Medicare PPO | Admitting: *Deleted

## 2013-04-10 DIAGNOSIS — I4891 Unspecified atrial fibrillation: Secondary | ICD-10-CM

## 2013-04-10 LAB — PACEMAKER DEVICE OBSERVATION
AL AMPLITUDE: 2.5 mv
BAMS-0001: 150 {beats}/min
DEVICE MODEL PM: 7316617
RV LEAD AMPLITUDE: 12 mv
RV LEAD IMPEDENCE PM: 725 Ohm
RV LEAD THRESHOLD: 0.75 V
VENTRICULAR PACING PM: 1

## 2013-04-10 NOTE — Progress Notes (Signed)
Pacemaker check in clinic. Normal device function. Thresholds, sensing, impedances consistent with previous measurements. Device programmed to maximize longevity.15 mode switches the longest 5 hours, - coumadin, hx of GI bleed.  No high ventricular rates noted. Device programmed at appropriate safety margins. Histogram distribution appropriate for patient activity level. Device programmed to optimize intrinsic conduction. Estimated longevity 6.8 years. Patient enrolled in remote follow-up/TTM's with [employer]. Plan to follow every 3 months remotely and see annually in office. Patient education completed.  ROV in February with Dr. Graciela Husbands.

## 2013-04-22 ENCOUNTER — Encounter: Payer: Self-pay | Admitting: Cardiology

## 2013-04-22 DIAGNOSIS — I351 Nonrheumatic aortic (valve) insufficiency: Secondary | ICD-10-CM | POA: Insufficient documentation

## 2013-04-22 DIAGNOSIS — K2100 Gastro-esophageal reflux disease with esophagitis, without bleeding: Secondary | ICD-10-CM | POA: Insufficient documentation

## 2013-04-22 DIAGNOSIS — I340 Nonrheumatic mitral (valve) insufficiency: Secondary | ICD-10-CM | POA: Insufficient documentation

## 2013-04-22 DIAGNOSIS — I420 Dilated cardiomyopathy: Secondary | ICD-10-CM | POA: Insufficient documentation

## 2013-04-22 DIAGNOSIS — I272 Pulmonary hypertension, unspecified: Secondary | ICD-10-CM | POA: Insufficient documentation

## 2013-04-22 DIAGNOSIS — I493 Ventricular premature depolarization: Secondary | ICD-10-CM | POA: Insufficient documentation

## 2013-04-23 ENCOUNTER — Ambulatory Visit (INDEPENDENT_AMBULATORY_CARE_PROVIDER_SITE_OTHER): Payer: Commercial Managed Care - HMO | Admitting: Cardiology

## 2013-04-23 ENCOUNTER — Encounter: Payer: Self-pay | Admitting: Cardiology

## 2013-04-23 VITALS — BP 130/60 | HR 72 | Ht 68.0 in | Wt 170.0 lb

## 2013-04-23 DIAGNOSIS — I340 Nonrheumatic mitral (valve) insufficiency: Secondary | ICD-10-CM

## 2013-04-23 DIAGNOSIS — I059 Rheumatic mitral valve disease, unspecified: Secondary | ICD-10-CM

## 2013-04-23 DIAGNOSIS — I2789 Other specified pulmonary heart diseases: Secondary | ICD-10-CM | POA: Diagnosis not present

## 2013-04-23 DIAGNOSIS — I272 Pulmonary hypertension, unspecified: Secondary | ICD-10-CM

## 2013-04-23 DIAGNOSIS — I351 Nonrheumatic aortic (valve) insufficiency: Secondary | ICD-10-CM

## 2013-04-23 DIAGNOSIS — I4949 Other premature depolarization: Secondary | ICD-10-CM

## 2013-04-23 DIAGNOSIS — Z950 Presence of cardiac pacemaker: Secondary | ICD-10-CM | POA: Diagnosis not present

## 2013-04-23 DIAGNOSIS — I5189 Other ill-defined heart diseases: Secondary | ICD-10-CM

## 2013-04-23 DIAGNOSIS — Z954 Presence of other heart-valve replacement: Secondary | ICD-10-CM | POA: Diagnosis not present

## 2013-04-23 DIAGNOSIS — Z953 Presence of xenogenic heart valve: Secondary | ICD-10-CM

## 2013-04-23 DIAGNOSIS — I4891 Unspecified atrial fibrillation: Secondary | ICD-10-CM

## 2013-04-23 DIAGNOSIS — I493 Ventricular premature depolarization: Secondary | ICD-10-CM

## 2013-04-23 DIAGNOSIS — I519 Heart disease, unspecified: Secondary | ICD-10-CM

## 2013-04-23 DIAGNOSIS — I359 Nonrheumatic aortic valve disorder, unspecified: Secondary | ICD-10-CM

## 2013-04-23 NOTE — Progress Notes (Signed)
[address] [place], [place]  [address] Phone: [phone] Fax:  [phone]  Date:  04/23/2013   ID:  Jasmine [name], DOB 12/09/1924, MRN [id number]  PCP:  Hollice Espy, MD  Cardiologist:  Armanda Magic, MD     History of Present Illness: Jasmine [name] is a 79 y.o. female with a history of HTN, atrial fibrillation, HOCM, bradycardia s/p PPM, severe MR s/p MVR, mild AI and diastolic dysfunction who presents today for folloupw.  She is doing well.  She denies any chest pain, SOB, DOE, LE edema, dizziness, palpitations or syncope.   Wt Readings from Last 3 Encounters:  04/23/13 170 lb (77.111 kg)  08/15/12 152 lb 6.4 oz (69.128 kg)  08/14/12 171 lb (77.565 kg)     Past Medical History  Diagnosis Date  . Heart murmur     Severe MR s/p MVR with pericardial tissue valve  . GERD (gastroesophageal reflux disease)   . Insomnia   . Eczema   . Glaucoma   . Diastolic dysfunction   . IHSS (idiopathic hypertrophic subaortic stenosis)     s/p septal myomectomy  . High cholesterol   . Shortness of breath   . Coronary artery disease   . Blood transfusion   . Chronic kidney disease (CKD), stage III (moderate)   . Severe mitral regurgitation   . Diverticulosis of colon with hemorrhage 08/12/2012  . GI bleed   . Asthma   . Hypertension   . Reflux esophagitis   . Aortic insufficiency - mild by echo 08/2012   . Diastolic dysfunction   . Bradycardia     s/p PPM  . Atrial fibrillation and flutter     atrial fibrillation s/p DCCV 06-2012- off anticoagulation due to recurrent GI bleed  . Paroxysmal atrial tachycardia   . PVCs (premature ventricular contractions)   . DCM (dilated cardiomyopathy)      50% echo 06/2012    Current Outpatient Prescriptions  Medication Sig Dispense Refill  . acetaminophen (TYLENOL) 500 MG tablet Take 500 mg by mouth every 6 (six) hours as needed. For pain      . albuterol (PROVENTIL HFA;VENTOLIN HFA) 108 (90 BASE) MCG/ACT inhaler Inhale 2  puffs into the lungs every 4 (four) hours as needed. For shortness of breath      . amLODipine (NORVASC) 10 MG tablet Take 5 mg by mouth daily.       Marland Kitchen aspirin 81 MG tablet Take 81 mg by mouth daily.      . budesonide (PULMICORT) 180 MCG/ACT inhaler Inhale 2 puffs into the lungs as needed.      . Calcium Carbonate-Vitamin D (CALTRATE 600+D) 600-400 MG-UNIT per tablet Take 1 tablet by mouth daily.      . cetirizine (ZYRTEC) 10 MG tablet Take 10 mg by mouth at bedtime as needed for allergies. For allergy symptoms      . dexlansoprazole (DEXILANT) 60 MG capsule Take 60 mg by mouth every morning.       . dorzolamide (TRUSOPT) 2 % ophthalmic solution Place 1 drop into both eyes 2 (two) times daily.      . dorzolamide-timolol (COSOPT) 22.3-6.8 MG/ML ophthalmic solution       . Ferrous Sulfate (IRON) 28 MG TABS Take 1 tablet by mouth daily.      . fluticasone (FLONASE) 50 MCG/ACT nasal spray Place into both nostrils as needed for allergies or rhinitis.      Marland Kitchen guaiFENesin (MUCINEX) 600 MG 12 hr  tablet Take 600 mg by mouth 2 (two) times daily as needed. For congestion      . lactobacillus acidophilus (BACID) TABS tablet Take 2 tablets by mouth 3 (three) times daily.      Marland Kitchen lisinopril (PRINIVIL,ZESTRIL) 20 MG tablet Take 20 mg by mouth daily.      Marland Kitchen loperamide (IMODIUM) 1 MG/5ML solution Take by mouth as needed for diarrhea or loose stools.      Marland Kitchen LORazepam (ATIVAN) 1 MG tablet Take 1 mg by mouth at bedtime.       . lovastatin (MEVACOR) 20 MG tablet Take 20 mg by mouth at bedtime.      . memantine (NAMENDA) 10 MG tablet Take 10 mg by mouth 2 (two) times daily.      . metoprolol succinate (TOPROL-XL) 25 MG 24 hr tablet Take 25 mg by mouth daily.       . Multiple Vitamins-Minerals (ALIVE WOMENS 50+ PO) Take by mouth daily.      Marland Kitchen omeprazole (PRILOSEC) 20 MG capsule Take 20 mg by mouth daily.      . potassium chloride (K-DUR) 10 MEQ tablet Take 10 mEq by mouth daily.      . Simethicone (GAS-X EXTRA  STRENGTH) 125 MG CAPS Take 125 mg by mouth as needed.      . vitamin E 400 UNIT capsule Take 400 Units by mouth daily.       No current facility-administered medications for this visit.    Allergies:    Allergies  Allergen Reactions  . Cymbalta [Duloxetine Hcl]     Diarrhea, weakness    Social History:  The patient  reports that she quit smoking about 38 years ago. Her smoking use included Cigarettes. She smoked 0.00 packs per day. She quit smokeless tobacco use about 38 years ago. Her smokeless tobacco use included Snuff. She reports that she does not drink alcohol or use illicit drugs.   Family History:  The patient's family history includes CVA in her brother and mother; Hypertension in her brother and mother.   ROS:  Please see the history of present illness.      All other systems reviewed and negative.   PHYSICAL EXAM: VS:  BP 130/60  Pulse 72  Ht 5\' 8"  (1.727 m)  Wt 170 lb (77.111 kg)  BMI 25.85 kg/m2 Well nourished, well developed, in no acute distress HEENT: normal Neck: no JVD Cardiac:  normal S1, S2; RRR; no murmur Lungs:  clear to auscultation bilaterally, no wheezing, rhonchi or rales Abd: soft, nontender, no hepatomegaly Ext: no edema Skin: warm and dry Neuro:  CNs 2-12 intact, no focal abnormalities noted  EKG:       ASSESSMENT AND PLAN:  1. HTN - controlled  - continue amlodipine/Lisinopril/metoprolol 2. Severe MR s/p MVR with pericardia tissue valve 3. Atrial fibrillation  - continue metoprolol  - not on systemic anticoagulation due to GI bleed 4. Bradycardia s/p PPM 5. Mild LV dysfunction EF 50% - continue ACE I/beta blocker 6. Mild  AI by echo 08/2012 7. Mild to moderate pulmonary HTN - resolved by echo 08/2012  Followup with me in 6 months  Signed, Armanda Magic, MD 04/23/2013 11:20 AM

## 2013-04-23 NOTE — Patient Instructions (Signed)
Your physician wants you to follow-up in: 6 MONTHS with Dr Turner.  You will receive a reminder letter in the mail two months in advance. If you don't receive a letter, please call our office to schedule the follow-up appointment.  Your physician recommends that you continue on your current medications as directed. Please refer to the Current Medication list given to you today.  

## 2013-05-17 ENCOUNTER — Encounter: Payer: Self-pay | Admitting: Internal Medicine

## 2013-07-10 ENCOUNTER — Ambulatory Visit (INDEPENDENT_AMBULATORY_CARE_PROVIDER_SITE_OTHER): Payer: Medicare HMO | Admitting: Internal Medicine

## 2013-07-10 ENCOUNTER — Encounter: Payer: Self-pay | Admitting: Internal Medicine

## 2013-07-10 VITALS — BP 152/85 | HR 69 | Ht 68.0 in | Wt 175.0 lb

## 2013-07-10 DIAGNOSIS — I4891 Unspecified atrial fibrillation: Secondary | ICD-10-CM

## 2013-07-10 DIAGNOSIS — R001 Bradycardia, unspecified: Secondary | ICD-10-CM

## 2013-07-10 DIAGNOSIS — I498 Other specified cardiac arrhythmias: Secondary | ICD-10-CM

## 2013-07-10 DIAGNOSIS — Z950 Presence of cardiac pacemaker: Secondary | ICD-10-CM

## 2013-07-10 DIAGNOSIS — I421 Obstructive hypertrophic cardiomyopathy: Secondary | ICD-10-CM

## 2013-07-10 LAB — MDC_IDC_ENUM_SESS_TYPE_INCLINIC
Date Time Interrogation Session: 20150203134748
Implantable Pulse Generator Serial Number: 7316617
Lead Channel Impedance Value: 650 Ohm
Lead Channel Pacing Threshold Amplitude: 0.75 V
Lead Channel Pacing Threshold Pulse Width: 0.5 ms
Lead Channel Pacing Threshold Pulse Width: 0.5 ms
Lead Channel Setting Pacing Amplitude: 2 V
Lead Channel Setting Pacing Amplitude: 2.5 V
Lead Channel Setting Sensing Sensitivity: 2 mV
MDC IDC MSMT BATTERY REMAINING LONGEVITY: 91.2 mo
MDC IDC MSMT BATTERY VOLTAGE: 2.95 V
MDC IDC MSMT LEADCHNL RA IMPEDANCE VALUE: 375 Ohm
MDC IDC MSMT LEADCHNL RA PACING THRESHOLD AMPLITUDE: 0.5 V
MDC IDC MSMT LEADCHNL RA SENSING INTR AMPL: 2.9 mV
MDC IDC MSMT LEADCHNL RV SENSING INTR AMPL: 12 mV
MDC IDC SET LEADCHNL RV PACING PULSEWIDTH: 0.5 ms
MDC IDC STAT BRADY RA PERCENT PACED: 98 %
MDC IDC STAT BRADY RV PERCENT PACED: 0.15 %

## 2013-07-10 NOTE — Progress Notes (Signed)
Patient Care Team: [name], MD as PCP - General (Family Medicine) Sueanne Margarita, MD as Attending Physician (Cardiology) Ivin Poot, MD as Attending Physician (Cardiothoracic Surgery)   HPI  Jasmine [name] is a 80 y.o. female Seen following pacemaker implantation 4/13 for symptomatic sinus node dysfunction with manifestations of junctional rhythm with rates in the 25-30 range.  She is a prior history of mitral valve replacement  normal LV function 7/12 She developed atrial fibrillation 2014 underwent cardioversion.  She has had no significant problems with functional capacity. She denies edema or chest pain. He although is more Past Medical History  Diagnosis Date  . Heart murmur     Severe MR s/p MVR  . GERD (gastroesophageal reflux disease)   . Insomnia   . Eczema   . Glaucoma   . Diastolic dysfunction   . IHSS (idiopathic hypertrophic subaortic stenosis)     s/p septal myomectomy  . High cholesterol   . Shortness of breath   . Coronary artery disease   . Blood transfusion   . Chronic kidney disease (CKD), stage III (moderate)   . Severe mitral regurgitation     s/p MVR with pericardial tissue valve  . Diverticulosis of colon with hemorrhage 08/12/2012  . GI bleed   . Asthma   . Hypertension   . Reflux esophagitis   . Pulmonary HTN     mild to moderate  . Aortic insufficiency   . Diastolic dysfunction   . Bradycardia     s/p PPM  . Atrial fibrillation and flutter     atrial fibrillation s/p DCCV 06-2012- off anticoagulation due to GI bleed  . Paroxysmal atrial tachycardia   . PVCs (premature ventricular contractions)   . DCM (dilated cardiomyopathy)      50% echo 06/2012    Past Surgical History  Procedure Laterality Date  . Cardiac catheterization    . Cardiac valve replacement  2007    MVR  . Myomectomy  2007    septal  . Tonsillectomy    . Appendectomy    . Cholecystectomy    . Tubal ligation    . Hernia repair      "in my  stomach"  . Cataract extraction w/ intraocular lens  implant, bilateral    . Cardioversion  07/04/2012    Procedure: CARDIOVERSION;  Surgeon: Sueanne Margarita, MD;  Location: [facility];  Service: Cardiovascular;  Laterality: N/A;  h/p in file drawer   . Colonoscopy    . Colon resection    . Colonoscopy N/A 08/13/2012    Procedure: COLONOSCOPY;  Surgeon: Gatha Mayer, MD;  Location: [facility];  Service: Endoscopy;  Laterality: N/A;    Current Outpatient Prescriptions  Medication Sig Dispense Refill  . acetaminophen (TYLENOL) 500 MG tablet Take 500 mg by mouth every 6 (six) hours as needed. For pain      . albuterol (PROVENTIL HFA;VENTOLIN HFA) 108 (90 BASE) MCG/ACT inhaler Inhale 2 puffs into the lungs every 4 (four) hours as needed. For shortness of breath      . amLODipine (NORVASC) 10 MG tablet Take 5 mg by mouth daily.       Marland Kitchen aspirin 81 MG tablet Take 81 mg by mouth daily.      . budesonide (PULMICORT) 180 MCG/ACT inhaler Inhale 2 puffs into the lungs as needed.      . Calcium Carbonate-Vitamin D (CALTRATE 600+D) 600-400 MG-UNIT per tablet Take 1 tablet by  mouth daily.      . cetirizine (ZYRTEC) 10 MG tablet Take 10 mg by mouth at bedtime as needed for allergies. For allergy symptoms      . dexlansoprazole (DEXILANT) 60 MG capsule Take 60 mg by mouth every morning.       . dorzolamide (TRUSOPT) 2 % ophthalmic solution Place 1 drop into both eyes 2 (two) times daily.      . dorzolamide-timolol (COSOPT) 22.3-6.8 MG/ML ophthalmic solution       . Ferrous Sulfate (IRON) 28 MG TABS Take 1 tablet by mouth daily.      . fluticasone (FLONASE) 50 MCG/ACT nasal spray Place into both nostrils as needed for allergies or rhinitis.      Marland Kitchen FLUZONE HIGH-DOSE injection       . guaiFENesin (MUCINEX) 600 MG 12 hr tablet Take 600 mg by mouth daily as needed. For congestion      . lisinopril (PRINIVIL,ZESTRIL) 20 MG tablet Take 20 mg by mouth daily.      Marland Kitchen loperamide (IMODIUM) 1 MG/5ML solution Take by  mouth as needed for diarrhea or loose stools.      Marland Kitchen LORazepam (ATIVAN) 1 MG tablet Take 1 mg by mouth at bedtime.       . lovastatin (MEVACOR) 20 MG tablet Take 20 mg by mouth at bedtime.      . memantine (NAMENDA) 10 MG tablet Take 10 mg by mouth 2 (two) times daily.      . metoprolol succinate (TOPROL-XL) 25 MG 24 hr tablet Take 25 mg by mouth daily.       . Multiple Vitamins-Minerals (ALIVE WOMENS 50+ PO) Take by mouth daily.      Marland Kitchen omeprazole (PRILOSEC) 20 MG capsule Take 20 mg by mouth daily.      . Simethicone (GAS-X EXTRA STRENGTH) 125 MG CAPS Take 125 mg by mouth as needed.      . vitamin E 400 UNIT capsule Take 400 Units by mouth daily.       No current facility-administered medications for this visit.    Allergies  Allergen Reactions  . Cymbalta [Duloxetine Hcl]     Diarrhea, weakness    Review of Systems negative except from HPI and PMH  Physical Exam BP 152/85  Pulse 69  Ht 5\' 8"  (1.727 m)  Wt 175 lb (79.379 kg)  BMI 26.61 kg/m2 Well developed and well nourished in no acute distress HENT normal E scleral and icterus clear Neck Supple JVP flat; carotids brisk and full Clear to ausculation  Regular rate and rhythm, no murmurs gallops or rub Soft with active bowel sounds No clubbing cyanosis none Edema Alert and oriented, grossly normal motor and sensory function Skin Warm and Dry  A pace with LBBB  Assessment and  Plan   Mitral regurgitation s/p MVR  I    Sinus node dysfunction Reasonable heart rate excursion  Pacemaker-St. Jude  The patient's device was interrogated.  The information was reviewed. No changes were made in the programming.     Atrial fibrillation status post cardioversion  This represents a class I indication for anticoagulation. I will review this with Dr. Radford Pax

## 2013-07-10 NOTE — Patient Instructions (Addendum)
Your physician recommends that you continue on your current medications as directed. Please refer to the Current Medication list given to you today.  Remote monitoring is used to monitor your Pacemaker of ICD from home. This monitoring reduces the number of office visits required to check your device to one time per year. It allows [place] to keep an eye on the functioning of your device to ensure it is working properly. You are scheduled for a device check from home on 10/11/13. You may send your transmission at any time that day. If you have a wireless device, the transmission will be sent automatically. After your physician reviews your transmission, you will receive a postcard with your next transmission date.  Your physician wants you to follow-up in: 1 year with Dr. Caryl Comes.  You will receive a reminder letter in the mail two months in advance. If you don't receive a letter, please call our office to schedule the follow-up appointment.   (Dr. Caryl Comes will follow up with Dr. Radford Pax about anticoagulation)

## 2013-07-11 ENCOUNTER — Telehealth: Payer: Self-pay | Admitting: General Surgery

## 2013-07-11 NOTE — Telephone Encounter (Signed)
Pt is aware and med taken off her list.

## 2013-07-11 NOTE — Telephone Encounter (Signed)
Message copied by Lily Kocher on Wed Jul 11, 2013 10:38 AM ------      Message from: Fransico Him R      Created: Tue Jul 10, 2013  6:26 PM       As we discussed, she has history of recurrent GI bleeds from bleeding diverticuli despite clipping.  Will also have her D/C ASA per our conversation      ----- Message -----         From: Deboraha Sprang, MD         Sent: 07/10/2013   2:03 PM           To: Sueanne Margarita, MD            Olivia Mackie, I realized today that this lady had undergone cardioversion. Patient with HCM have class I indication   for anticoagulation  I see that she is on aspirin and I wonder whether based on [place] apixaban would be appropriate       ------

## 2013-07-17 ENCOUNTER — Telehealth: Payer: Self-pay | Admitting: Cardiology

## 2013-07-17 NOTE — Telephone Encounter (Signed)
Spoke with pt and made aware the last time we spoke was on 07/11/13 and pt was aware already with [place] d/c ASA

## 2013-07-17 NOTE — Telephone Encounter (Signed)
New message     Pt saw in her caller ID we had called.  No message was left.  Returning someone's call

## 2013-07-23 ENCOUNTER — Encounter: Payer: Self-pay | Admitting: Internal Medicine

## 2013-09-05 ENCOUNTER — Other Ambulatory Visit: Payer: Self-pay

## 2013-09-05 MED ORDER — METOPROLOL SUCCINATE ER 25 MG PO TB24: 25.0000 mg | ORAL_TABLET | Freq: Every day | ORAL | Status: DC

## 2013-10-11 ENCOUNTER — Encounter: Payer: Medicare PPO | Admitting: *Deleted

## 2013-10-11 ENCOUNTER — Telehealth: Payer: Self-pay | Admitting: Cardiology

## 2013-10-11 NOTE — Telephone Encounter (Signed)
Pt does not have a landline. Remote will be cancelled and we will see in clinic in Aug/2015. Cell adapter conversation to occur in Aug.

## 2013-10-11 NOTE — Telephone Encounter (Signed)
Called to remind pt of home device checked that needed to be done. Pt stated that her daughter tried to set up machine but could not set up. Pt stated that she would come to office to have device checked.

## 2014-01-29 ENCOUNTER — Encounter: Payer: Self-pay | Admitting: *Deleted

## 2014-02-20 ENCOUNTER — Encounter: Payer: Self-pay | Admitting: Cardiology

## 2014-02-20 ENCOUNTER — Ambulatory Visit (INDEPENDENT_AMBULATORY_CARE_PROVIDER_SITE_OTHER): Payer: Commercial Managed Care - HMO | Admitting: Cardiology

## 2014-02-20 ENCOUNTER — Ambulatory Visit (INDEPENDENT_AMBULATORY_CARE_PROVIDER_SITE_OTHER): Payer: Commercial Managed Care - HMO | Admitting: *Deleted

## 2014-02-20 VITALS — BP 136/74 | HR 71 | Ht 67.0 in | Wt 173.6 lb

## 2014-02-20 DIAGNOSIS — Z7901 Long term (current) use of anticoagulants: Secondary | ICD-10-CM

## 2014-02-20 DIAGNOSIS — Z952 Presence of prosthetic heart valve: Secondary | ICD-10-CM | POA: Diagnosis not present

## 2014-02-20 DIAGNOSIS — I5189 Other ill-defined heart diseases: Secondary | ICD-10-CM

## 2014-02-20 DIAGNOSIS — I428 Other cardiomyopathies: Secondary | ICD-10-CM

## 2014-02-20 DIAGNOSIS — Z950 Presence of cardiac pacemaker: Secondary | ICD-10-CM

## 2014-02-20 DIAGNOSIS — I519 Heart disease, unspecified: Secondary | ICD-10-CM

## 2014-02-20 DIAGNOSIS — I4891 Unspecified atrial fibrillation: Secondary | ICD-10-CM

## 2014-02-20 DIAGNOSIS — I059 Rheumatic mitral valve disease, unspecified: Secondary | ICD-10-CM

## 2014-02-20 DIAGNOSIS — Z953 Presence of xenogenic heart valve: Secondary | ICD-10-CM

## 2014-02-20 DIAGNOSIS — I480 Paroxysmal atrial fibrillation: Secondary | ICD-10-CM

## 2014-02-20 DIAGNOSIS — I420 Dilated cardiomyopathy: Secondary | ICD-10-CM

## 2014-02-20 DIAGNOSIS — I4949 Other premature depolarization: Secondary | ICD-10-CM

## 2014-02-20 DIAGNOSIS — I4510 Unspecified right bundle-branch block: Secondary | ICD-10-CM

## 2014-02-20 DIAGNOSIS — R001 Bradycardia, unspecified: Secondary | ICD-10-CM | POA: Insufficient documentation

## 2014-02-20 DIAGNOSIS — I493 Ventricular premature depolarization: Secondary | ICD-10-CM

## 2014-02-20 DIAGNOSIS — I340 Nonrheumatic mitral (valve) insufficiency: Secondary | ICD-10-CM

## 2014-02-20 LAB — MDC_IDC_ENUM_SESS_TYPE_INCLINIC
Brady Statistic RV Percent Paced: 0 %
Implantable Pulse Generator Model: 2110
Implantable Pulse Generator Serial Number: 7316617
Lead Channel Pacing Threshold Amplitude: 1 V
Lead Channel Pacing Threshold Amplitude: 1 V
Lead Channel Pacing Threshold Pulse Width: 0.4 ms
Lead Channel Pacing Threshold Pulse Width: 0.4 ms
Lead Channel Sensing Intrinsic Amplitude: 3.2 mV
Lead Channel Setting Pacing Amplitude: 2 V
Lead Channel Setting Sensing Sensitivity: 2 mV
MDC IDC MSMT BATTERY REMAINING LONGEVITY: 106.8 mo
MDC IDC MSMT BATTERY VOLTAGE: 2.95 V
MDC IDC MSMT LEADCHNL RA IMPEDANCE VALUE: 437.5 Ohm
MDC IDC MSMT LEADCHNL RV IMPEDANCE VALUE: 662.5 Ohm
MDC IDC MSMT LEADCHNL RV SENSING INTR AMPL: 12 mV
MDC IDC SESS DTM: 20150916130842
MDC IDC SET LEADCHNL RV PACING AMPLITUDE: 2.5 V
MDC IDC SET LEADCHNL RV PACING PULSEWIDTH: 0.4 ms
MDC IDC STAT BRADY RA PERCENT PACED: 0 %

## 2014-02-20 NOTE — Progress Notes (Addendum)
[place], [address] [place], [place]  [address] Phone: [phone] Fax:  [phone]  Date:  02/20/2014   ID:  SUKARI GRIST, DOB 1924-07-05, MRN [id number]  PCP:  [name], MD  Cardiologist:  Fransico Him, MD     History of Present Illness: Jasmine [name] is a 80 y.o. female with a history of HTN, atrial fibrillation, HOCM, bradycardia s/p PPM, severe MR s/p pericardial tissue MVR, mild AI and diastolic dysfunction who presents today for followup. She is doing well. She denies any  SOB, DOE, LE edema, dizziness, palpitations or syncope. She says that occasionally she will have some pain in her midsternal region that is sharp and only lasts a few minutes and is nonexertional.  She is usually sitting or lying down and if she takes a Gas X pill or passes gas it resolves.  I mainly occurs at night.     Wt Readings from Last 3 Encounters:  02/20/14 173 lb 9.6 oz (78.744 kg)  07/10/13 175 lb (79.379 kg)  04/23/13 170 lb (77.111 kg)     Past Medical History  Diagnosis Date  . GERD (gastroesophageal reflux disease)   . Insomnia   . Eczema   . Glaucoma   . Diastolic dysfunction   . IHSS (idiopathic hypertrophic subaortic stenosis)     s/p septal myomectomy  . High cholesterol   . Coronary artery disease   . Blood transfusion   . Chronic kidney disease (CKD), stage III (moderate)   . Severe mitral regurgitation     s/p MVR with pericardial tissue valve  . Diverticulosis of colon with hemorrhage 08/12/2012  . GI bleed   . Asthma   . Hypertension   . Reflux esophagitis   . Pulmonary HTN     mild to moderate  . Aortic insufficiency   . Diastolic dysfunction   . Bradycardia     s/p PPM  . Atrial fibrillation and flutter     atrial fibrillation s/p DCCV 06-2012-  now in chronic atrial fibrillation off anticoagulation due to GI bleed  . Paroxysmal atrial tachycardia   . PVCs (premature ventricular contractions)   . DCM (dilated cardiomyopathy)      50% echo 06/2012    . Heart murmur     Current Outpatient Prescriptions  Medication Sig Dispense Refill  . acetaminophen (TYLENOL) 500 MG tablet Take 500 mg by mouth every 6 (six) hours as needed. For pain      . albuterol (PROVENTIL HFA;VENTOLIN HFA) 108 (90 BASE) MCG/ACT inhaler Inhale 2 puffs into the lungs every 4 (four) hours as needed. For shortness of breath      . amLODipine (NORVASC) 10 MG tablet Take 5 mg by mouth daily.       . budesonide (PULMICORT) 180 MCG/ACT inhaler Inhale 2 puffs into the lungs as needed.      . Calcium Carbonate-Vitamin D (CALTRATE 600+D) 600-400 MG-UNIT per tablet Take 1 tablet by mouth daily.      . cetirizine (ZYRTEC) 10 MG tablet Take 10 mg by mouth at bedtime as needed for allergies. For allergy symptoms      . dexlansoprazole (DEXILANT) 60 MG capsule Take 60 mg by mouth every morning.       . dorzolamide (TRUSOPT) 2 % ophthalmic solution Place 1 drop into both eyes 2 (two) times daily.      . dorzolamide-timolol (COSOPT) 22.3-6.8 MG/ML ophthalmic solution       . Ferrous Sulfate (IRON) 28 MG  TABS Take 1 tablet by mouth daily.      . fluticasone (FLONASE) 50 MCG/ACT nasal spray Place into both nostrils as needed for allergies or rhinitis.      Marland Kitchen FLUZONE HIGH-DOSE injection       . guaiFENesin (MUCINEX) 600 MG 12 hr tablet Take 600 mg by mouth daily as needed. For congestion      . lisinopril (PRINIVIL,ZESTRIL) 20 MG tablet Take 20 mg by mouth daily.      Marland Kitchen loperamide (IMODIUM) 1 MG/5ML solution Take by mouth as needed for diarrhea or loose stools.      Marland Kitchen LORazepam (ATIVAN) 1 MG tablet Take 1 mg by mouth at bedtime.       . lovastatin (MEVACOR) 20 MG tablet Take 20 mg by mouth at bedtime.      . memantine (NAMENDA) 10 MG tablet Take 10 mg by mouth 2 (two) times daily.      . metoprolol succinate (TOPROL-XL) 25 MG 24 hr tablet Take 1 tablet (25 mg total) by mouth daily.  30 tablet  6  . Multiple Vitamins-Minerals (ALIVE WOMENS 50+ PO) Take by mouth daily.      Marland Kitchen omeprazole  (PRILOSEC) 20 MG capsule Take 20 mg by mouth daily.      . pantoprazole (PROTONIX) 40 MG tablet Take 40 mg by mouth daily.      . Simethicone (GAS-X EXTRA STRENGTH) 125 MG CAPS Take 125 mg by mouth as needed.      . vitamin E 400 UNIT capsule Take 400 Units by mouth daily.       No current facility-administered medications for this visit.    Allergies:    Allergies  Allergen Reactions  . Cymbalta [Duloxetine Hcl]     Diarrhea, weakness    Social History:  The patient  reports that she quit smoking about 39 years ago. Her smoking use included Cigarettes. She smoked 0.00 packs per day. She quit smokeless tobacco use about 38 years ago. Her smokeless tobacco use included Snuff. She reports that she does not drink alcohol or use illicit drugs.   Family History:  The patient's family history includes CVA in her brother and mother; Hypertension in her brother and mother.   ROS:  Please see the history of present illness.      All other systems reviewed and negative.   PHYSICAL EXAM: VS:  BP 136/74  Pulse 71  Ht 5\' 7"  (1.702 m)  Wt 173 lb 9.6 oz (78.744 kg)  BMI 27.18 kg/m2 Well nourished, well developed, in no acute distress HEENT: normal Neck: no JVD Cardiac:  normal S1, S2; RRR; no murmur Lungs:  clear to auscultation bilaterally, no wheezing, rhonchi or rales Abd: soft, nontender, no hepatomegaly Ext: no edema Skin: warm and dry Neuro:  CNs 2-12 intact, no focal abnormalities noted  EKG:  Atrial fibrillation  ASSESSMENT AND PLAN:  1. HTN - controlled - continue amlodipine/Lisinopril/metoprolol  - check BMET 2. Severe MR s/p MVR with pericardial tissue valve 3. Chronic Atrial fibrillation - EKG now shows that she is in chronic atrial fibrillation rate controlled.  It is a difficult situation - she has had GI bleed in the past and when placed back on coumadin rebled.  Dr. Collene Mares has stated that she cannot be on ASA or anticoagulation going forward and risks of bleeding outweigh  risk of CVA.  I have discussed this with the patient and her daughter and they understand that now that she is in afib  we cannot get her back in NSR because we cannot anticoagulate her.  She is asymptomatic and HR is controlled. - continue metoprolol  - not on systemic anticoagulation due to GI bleed  4. Bradycardia s/p PPM 5. Mild LV dysfunction EF 50% - continue ACE I/beta blocker 6. Mild AI by echo 08/2012 7. Mild to moderate pulmonary HTN - resolved by echo 08/2012 8. Atypical CP resolves with GasX - c/w GI etiology  Followup with me in 6 months     Signed, Fransico Him, MD 02/20/2014 9:19 PM

## 2014-02-20 NOTE — Addendum Note (Signed)
Addended by: Dennie Fetters on: 02/20/2014 10:31 AM   Modules accepted: Orders

## 2014-02-20 NOTE — Patient Instructions (Addendum)
Your physician recommends that you continue on your current medications as directed. Please refer to the Current Medication list given to you today.  Your physician recommends that you return for lab work in: today  Your physician wants you to follow-up in: 6 months. You will receive a reminder letter in the mail two months in advance. If you don't receive a letter, please call our office to schedule the follow-up appointment.   

## 2014-02-20 NOTE — Progress Notes (Signed)
Pt presented w/ device in Backup Mode VVI/67, 5.0V@0 .76ms, unipolar. Device reset by industry/[employer]. All parameters programmed back to original settings from 07/10/2013 check. Threshold, sensing, impedances consistent with previous measurements. Pt currently in Afib; anti-coag discontinued per family member due to stomach bleed. Device programmed to maximize longevity. Device programmed at appropriate safety margins. Histogram distribution appropriate for patient activity level. Device programmed to optimize intrinsic conduction---changed lower rate from 70 to 60. Changed AMS rate from 80 to 60bpm; turned off sensor while in mode switch. Estimated longevity 7.4-8.7yrs. Merlin 05/22/14 (cell adapter given, new Merlin ordered & shipped by [facility]) & ROV w/ SK in 5 mo.

## 2014-02-21 ENCOUNTER — Encounter: Payer: Self-pay | Admitting: General Surgery

## 2014-02-21 LAB — BASIC METABOLIC PANEL
BUN: 8 mg/dL (ref 6–23)
CALCIUM: 9 mg/dL (ref 8.4–10.5)
CHLORIDE: 107 meq/L (ref 96–112)
CO2: 27 mEq/L (ref 19–32)
CREATININE: 1.2 mg/dL (ref 0.4–1.2)
GFR: 56.23 mL/min — AB (ref >60.00)
Glucose, Bld: 116 mg/dL — ABNORMAL HIGH (ref 70–99)
Potassium: 4.2 mEq/L (ref 3.5–5.1)
Sodium: 140 mEq/L (ref 135–145)

## 2014-03-04 ENCOUNTER — Encounter: Payer: Self-pay | Admitting: Internal Medicine

## 2014-03-04 ENCOUNTER — Other Ambulatory Visit: Payer: Self-pay | Admitting: Family Medicine

## 2014-03-04 DIAGNOSIS — R1310 Dysphagia, unspecified: Secondary | ICD-10-CM

## 2014-03-06 ENCOUNTER — Ambulatory Visit
Admission: RE | Admit: 2014-03-06 | Discharge: 2014-03-06 | Disposition: A | Discharge status: Home / Self Care | Payer: Medicare HMO | Source: Ambulatory Visit

## 2014-03-06 ENCOUNTER — Ambulatory Visit
Admission: RE | Admit: 2014-03-06 | Discharge: 2014-03-06 | Disposition: A | Discharge status: Home / Self Care | Payer: Self-pay | Source: Ambulatory Visit | Attending: Family Medicine | Admitting: Family Medicine

## 2014-03-06 DIAGNOSIS — R1310 Dysphagia, unspecified: Secondary | ICD-10-CM

## 2014-03-07 ENCOUNTER — Other Ambulatory Visit: Payer: Self-pay | Admitting: Family Medicine

## 2014-03-07 DIAGNOSIS — R1310 Dysphagia, unspecified: Secondary | ICD-10-CM

## 2014-03-11 ENCOUNTER — Other Ambulatory Visit: Payer: Self-pay | Admitting: Family Medicine

## 2014-03-11 DIAGNOSIS — R1310 Dysphagia, unspecified: Secondary | ICD-10-CM

## 2014-05-10 ENCOUNTER — Other Ambulatory Visit: Payer: Self-pay | Admitting: Cardiology

## 2014-05-11 NOTE — Telephone Encounter (Signed)
Rx was sent to pharmacy electronically. 

## 2014-05-16 ENCOUNTER — Encounter (HOSPITAL_COMMUNITY): Payer: Self-pay | Admitting: Internal Medicine

## 2014-05-22 ENCOUNTER — Encounter: Payer: Self-pay | Admitting: Internal Medicine

## 2014-05-22 ENCOUNTER — Ambulatory Visit (INDEPENDENT_AMBULATORY_CARE_PROVIDER_SITE_OTHER): Payer: Commercial Managed Care - HMO | Admitting: *Deleted

## 2014-05-22 ENCOUNTER — Telehealth: Payer: Self-pay | Admitting: Cardiology

## 2014-05-22 DIAGNOSIS — I480 Paroxysmal atrial fibrillation: Secondary | ICD-10-CM

## 2014-05-22 LAB — MDC_IDC_ENUM_SESS_TYPE_REMOTE
Battery Remaining Percentage: 68 %
Brady Statistic AP VS Percent: 0 %
Brady Statistic AS VP Percent: 0 %
Brady Statistic RA Percent Paced: 0 %
Date Time Interrogation Session: 20151216210859
Implantable Pulse Generator Model: 2110
Lead Channel Pacing Threshold Amplitude: 1 V
Lead Channel Sensing Intrinsic Amplitude: 3.2 mV
Lead Channel Setting Pacing Amplitude: 2.5 V
Lead Channel Setting Sensing Sensitivity: 2 mV
MDC IDC MSMT BATTERY REMAINING LONGEVITY: 92 mo
MDC IDC MSMT BATTERY VOLTAGE: 2.93 V
MDC IDC MSMT LEADCHNL RA IMPEDANCE VALUE: 450 Ohm
MDC IDC MSMT LEADCHNL RV IMPEDANCE VALUE: 690 Ohm
MDC IDC MSMT LEADCHNL RV PACING THRESHOLD PULSEWIDTH: 0.4 ms
MDC IDC MSMT LEADCHNL RV SENSING INTR AMPL: 12 mV
MDC IDC PG SERIAL: 7316617
MDC IDC SET LEADCHNL RA PACING AMPLITUDE: 2 V
MDC IDC SET LEADCHNL RV PACING PULSEWIDTH: 0.4 ms
MDC IDC STAT BRADY AP VP PERCENT: 0 %
MDC IDC STAT BRADY AS VS PERCENT: 0 %
MDC IDC STAT BRADY RV PERCENT PACED: 11 %

## 2014-05-22 NOTE — Telephone Encounter (Signed)
Spoke with pt and reminded pt of remote transmission that is due today. Pt verbalized understanding.   

## 2014-05-22 NOTE — Progress Notes (Signed)
Remote pacemaker transmission.   

## 2014-06-10 ENCOUNTER — Inpatient Hospital Stay (HOSPITAL_COMMUNITY)
Admission: EM | Admit: 2014-06-10 | Discharge: 2014-06-14 | DRG: 281 | Disposition: A | Discharge status: Home / Self Care | Payer: Commercial Managed Care - HMO | Attending: Internal Medicine | Admitting: Internal Medicine

## 2014-06-10 ENCOUNTER — Emergency Department (HOSPITAL_COMMUNITY): Payer: Commercial Managed Care - HMO

## 2014-06-10 ENCOUNTER — Encounter (HOSPITAL_COMMUNITY): Payer: Self-pay | Admitting: Emergency Medicine

## 2014-06-10 DIAGNOSIS — Z952 Presence of prosthetic heart valve: Secondary | ICD-10-CM | POA: Diagnosis not present

## 2014-06-10 DIAGNOSIS — Z9841 Cataract extraction status, right eye: Secondary | ICD-10-CM | POA: Diagnosis not present

## 2014-06-10 DIAGNOSIS — Z66 Do not resuscitate: Secondary | ICD-10-CM | POA: Diagnosis present

## 2014-06-10 DIAGNOSIS — R778 Other specified abnormalities of plasma proteins: Secondary | ICD-10-CM

## 2014-06-10 DIAGNOSIS — I129 Hypertensive chronic kidney disease with stage 1 through stage 4 chronic kidney disease, or unspecified chronic kidney disease: Secondary | ICD-10-CM | POA: Diagnosis present

## 2014-06-10 DIAGNOSIS — Z888 Allergy status to other drugs, medicaments and biological substances status: Secondary | ICD-10-CM

## 2014-06-10 DIAGNOSIS — R739 Hyperglycemia, unspecified: Secondary | ICD-10-CM | POA: Diagnosis present

## 2014-06-10 DIAGNOSIS — I2510 Atherosclerotic heart disease of native coronary artery without angina pectoris: Secondary | ICD-10-CM | POA: Diagnosis present

## 2014-06-10 DIAGNOSIS — Z7982 Long term (current) use of aspirin: Secondary | ICD-10-CM

## 2014-06-10 DIAGNOSIS — J45909 Unspecified asthma, uncomplicated: Secondary | ICD-10-CM | POA: Diagnosis present

## 2014-06-10 DIAGNOSIS — I272 Other secondary pulmonary hypertension: Secondary | ICD-10-CM | POA: Diagnosis not present

## 2014-06-10 DIAGNOSIS — I214 Non-ST elevation (NSTEMI) myocardial infarction: Secondary | ICD-10-CM | POA: Diagnosis present

## 2014-06-10 DIAGNOSIS — Z87891 Personal history of nicotine dependence: Secondary | ICD-10-CM | POA: Diagnosis not present

## 2014-06-10 DIAGNOSIS — F0390 Unspecified dementia without behavioral disturbance: Secondary | ICD-10-CM | POA: Diagnosis not present

## 2014-06-10 DIAGNOSIS — G4700 Insomnia, unspecified: Secondary | ICD-10-CM | POA: Diagnosis present

## 2014-06-10 DIAGNOSIS — R748 Abnormal levels of other serum enzymes: Secondary | ICD-10-CM | POA: Diagnosis not present

## 2014-06-10 DIAGNOSIS — I509 Heart failure, unspecified: Secondary | ICD-10-CM | POA: Diagnosis not present

## 2014-06-10 DIAGNOSIS — Z9842 Cataract extraction status, left eye: Secondary | ICD-10-CM

## 2014-06-10 DIAGNOSIS — E780 Pure hypercholesterolemia: Secondary | ICD-10-CM | POA: Diagnosis present

## 2014-06-10 DIAGNOSIS — I248 Other forms of acute ischemic heart disease: Secondary | ICD-10-CM | POA: Diagnosis present

## 2014-06-10 DIAGNOSIS — I482 Chronic atrial fibrillation: Secondary | ICD-10-CM | POA: Diagnosis not present

## 2014-06-10 DIAGNOSIS — I517 Cardiomegaly: Secondary | ICD-10-CM | POA: Diagnosis not present

## 2014-06-10 DIAGNOSIS — F0391 Unspecified dementia with behavioral disturbance: Secondary | ICD-10-CM | POA: Diagnosis not present

## 2014-06-10 DIAGNOSIS — I4891 Unspecified atrial fibrillation: Secondary | ICD-10-CM | POA: Diagnosis not present

## 2014-06-10 DIAGNOSIS — I447 Left bundle-branch block, unspecified: Secondary | ICD-10-CM | POA: Diagnosis present

## 2014-06-10 DIAGNOSIS — K219 Gastro-esophageal reflux disease without esophagitis: Secondary | ICD-10-CM | POA: Diagnosis present

## 2014-06-10 DIAGNOSIS — I421 Obstructive hypertrophic cardiomyopathy: Secondary | ICD-10-CM | POA: Diagnosis not present

## 2014-06-10 DIAGNOSIS — R7989 Other specified abnormal findings of blood chemistry: Secondary | ICD-10-CM | POA: Diagnosis not present

## 2014-06-10 DIAGNOSIS — E785 Hyperlipidemia, unspecified: Secondary | ICD-10-CM | POA: Diagnosis present

## 2014-06-10 DIAGNOSIS — I5042 Chronic combined systolic (congestive) and diastolic (congestive) heart failure: Secondary | ICD-10-CM | POA: Diagnosis present

## 2014-06-10 DIAGNOSIS — R079 Chest pain, unspecified: Secondary | ICD-10-CM | POA: Diagnosis not present

## 2014-06-10 DIAGNOSIS — I340 Nonrheumatic mitral (valve) insufficiency: Secondary | ICD-10-CM | POA: Diagnosis present

## 2014-06-10 DIAGNOSIS — Z961 Presence of intraocular lens: Secondary | ICD-10-CM | POA: Diagnosis present

## 2014-06-10 DIAGNOSIS — N183 Chronic kidney disease, stage 3 (moderate): Secondary | ICD-10-CM | POA: Diagnosis present

## 2014-06-10 DIAGNOSIS — Z9049 Acquired absence of other specified parts of digestive tract: Secondary | ICD-10-CM | POA: Diagnosis present

## 2014-06-10 DIAGNOSIS — I422 Other hypertrophic cardiomyopathy: Secondary | ICD-10-CM | POA: Diagnosis not present

## 2014-06-10 DIAGNOSIS — Z950 Presence of cardiac pacemaker: Secondary | ICD-10-CM | POA: Diagnosis not present

## 2014-06-10 DIAGNOSIS — R0789 Other chest pain: Secondary | ICD-10-CM | POA: Diagnosis not present

## 2014-06-10 LAB — BASIC METABOLIC PANEL
Anion gap: 9 (ref 5–15)
BUN: 7 mg/dL (ref 6–23)
CALCIUM: 9.1 mg/dL (ref 8.4–10.5)
CHLORIDE: 108 meq/L (ref 96–112)
CO2: 21 mmol/L (ref 19–32)
CREATININE: 1.09 mg/dL (ref 0.50–1.10)
GFR calc Af Amer: 51 mL/min — ABNORMAL LOW (ref >90)
GFR, EST NON AFRICAN AMERICAN: 44 mL/min — AB (ref >90)
GLUCOSE: 129 mg/dL — AB (ref 70–99)
Potassium: 3.9 mmol/L (ref 3.5–5.1)
Sodium: 138 mmol/L (ref 135–145)

## 2014-06-10 LAB — CBC
HCT: 41 % (ref 36.0–46.0)
HEMOGLOBIN: 13.4 g/dL (ref 12.0–15.0)
MCH: 29.6 pg (ref 26.0–34.0)
MCHC: 32.7 g/dL (ref 30.0–36.0)
MCV: 90.7 fL (ref 78.0–100.0)
PLATELETS: 215 10*3/uL (ref 150–400)
RBC: 4.52 MIL/uL (ref 3.87–5.11)
RDW: 14.2 % (ref 11.5–15.5)
WBC: 11.6 10*3/uL — AB (ref 4.0–10.5)

## 2014-06-10 LAB — TROPONIN I: Troponin I: 0.42 ng/mL — ABNORMAL HIGH (ref <0.031)

## 2014-06-10 LAB — MAGNESIUM: Magnesium: 2.2 mg/dL (ref 1.5–2.5)

## 2014-06-10 LAB — BRAIN NATRIURETIC PEPTIDE: B NATRIURETIC PEPTIDE 5: 143.5 pg/mL — AB (ref 0.0–100.0)

## 2014-06-10 LAB — TSH: TSH: 2.32 u[IU]/mL (ref 0.350–4.500)

## 2014-06-10 MED ORDER — ZOLPIDEM TARTRATE 5 MG PO TABS
5.0000 mg | ORAL_TABLET | Freq: Every evening | ORAL | Status: DC | PRN
  Administered 2014-06-12: 5 mg via ORAL
  Filled 2014-06-10: qty 1

## 2014-06-10 MED ORDER — HEPARIN SODIUM (PORCINE) 5000 UNIT/ML IJ SOLN
5000.0000 [IU] | Freq: Three times a day (TID) | INTRAMUSCULAR | Status: DC
  Filled 2014-06-10: qty 1

## 2014-06-10 MED ORDER — HYDROMORPHONE HCL 1 MG/ML IJ SOLN
1.0000 mg | INTRAMUSCULAR | Status: AC | PRN
Start: 2014-06-10 — End: 2014-06-11

## 2014-06-10 MED ORDER — LISINOPRIL 20 MG PO TABS
20.0000 mg | ORAL_TABLET | Freq: Every day | ORAL | Status: DC
  Administered 2014-06-11: 20 mg via ORAL
  Filled 2014-06-10 (×2): qty 1

## 2014-06-10 MED ORDER — DILTIAZEM HCL 100 MG IV SOLR
5.0000 mg/h | INTRAVENOUS | Status: AC
  Administered 2014-06-10 – 2014-06-11 (×2): 5 mg/h via INTRAVENOUS
  Filled 2014-06-10 (×3): qty 100

## 2014-06-10 MED ORDER — ONDANSETRON HCL 4 MG PO TABS
4.0000 mg | ORAL_TABLET | Freq: Four times a day (QID) | ORAL | Status: DC | PRN
Start: 2014-06-10 — End: 2014-06-14

## 2014-06-10 MED ORDER — DORZOLAMIDE HCL 2 % OP SOLN
1.0000 [drp] | Freq: Two times a day (BID) | OPHTHALMIC | Status: DC
  Administered 2014-06-10 – 2014-06-14 (×8): 1 [drp] via OPHTHALMIC
  Filled 2014-06-10: qty 10

## 2014-06-10 MED ORDER — LORAZEPAM 1 MG PO TABS
1.0000 mg | ORAL_TABLET | Freq: Every day | ORAL | Status: DC
  Administered 2014-06-10 – 2014-06-13 (×4): 1 mg via ORAL
  Filled 2014-06-10 (×3): qty 1
  Filled 2014-06-10: qty 2

## 2014-06-10 MED ORDER — HYDROCODONE-ACETAMINOPHEN 5-325 MG PO TABS
1.0000 | ORAL_TABLET | ORAL | Status: DC | PRN
  Administered 2014-06-11 – 2014-06-12 (×2): 2 via ORAL
  Filled 2014-06-10 (×2): qty 2

## 2014-06-10 MED ORDER — METOPROLOL TARTRATE 25 MG PO TABS
25.0000 mg | ORAL_TABLET | Freq: Two times a day (BID) | ORAL | Status: DC
  Administered 2014-06-11 – 2014-06-13 (×6): 25 mg via ORAL
  Filled 2014-06-10 (×8): qty 1

## 2014-06-10 MED ORDER — PRAVASTATIN SODIUM 10 MG PO TABS
10.0000 mg | ORAL_TABLET | Freq: Every day | ORAL | Status: DC
  Administered 2014-06-11 – 2014-06-13 (×3): 10 mg via ORAL
  Filled 2014-06-10 (×5): qty 1

## 2014-06-10 MED ORDER — GI COCKTAIL ~~LOC~~
30.0000 mL | Freq: Once | ORAL | Status: AC
  Administered 2014-06-10: 30 mL via ORAL
  Filled 2014-06-10: qty 30

## 2014-06-10 MED ORDER — PANTOPRAZOLE SODIUM 40 MG PO TBEC
40.0000 mg | DELAYED_RELEASE_TABLET | Freq: Every day | ORAL | Status: DC
  Administered 2014-06-11: 40 mg via ORAL

## 2014-06-10 MED ORDER — SODIUM CHLORIDE 0.9 % IJ SOLN
3.0000 mL | Freq: Two times a day (BID) | INTRAMUSCULAR | Status: DC
  Administered 2014-06-11 – 2014-06-13 (×6): 3 mL via INTRAVENOUS

## 2014-06-10 MED ORDER — ACETAMINOPHEN 650 MG RE SUPP: 650.0000 mg | Freq: Four times a day (QID) | RECTAL | Status: DC | PRN

## 2014-06-10 MED ORDER — MEMANTINE HCL 10 MG PO TABS
10.0000 mg | ORAL_TABLET | Freq: Two times a day (BID) | ORAL | Status: DC
  Administered 2014-06-10 – 2014-06-14 (×8): 10 mg via ORAL
  Filled 2014-06-10 (×9): qty 1

## 2014-06-10 MED ORDER — SODIUM CHLORIDE 0.9 % IV SOLN: INTRAVENOUS | Status: AC

## 2014-06-10 MED ORDER — IRON 28 MG PO TABS: 1.0000 | ORAL_TABLET | Freq: Every day | ORAL | Status: DC

## 2014-06-10 MED ORDER — LORATADINE 10 MG PO TABS
10.0000 mg | ORAL_TABLET | Freq: Every day | ORAL | Status: DC
  Administered 2014-06-11 – 2014-06-14 (×4): 10 mg via ORAL
  Filled 2014-06-10 (×4): qty 1

## 2014-06-10 MED ORDER — FLUTICASONE PROPIONATE HFA 44 MCG/ACT IN AERO
2.0000 | INHALATION_SPRAY | Freq: Two times a day (BID) | RESPIRATORY_TRACT | Status: DC
  Administered 2014-06-11 – 2014-06-14 (×5): 2 via RESPIRATORY_TRACT
  Filled 2014-06-10: qty 10.6

## 2014-06-10 MED ORDER — ONDANSETRON HCL 4 MG/2ML IJ SOLN: 4.0000 mg | Freq: Three times a day (TID) | INTRAMUSCULAR | Status: AC | PRN

## 2014-06-10 MED ORDER — PANTOPRAZOLE SODIUM 40 MG PO TBEC
40.0000 mg | DELAYED_RELEASE_TABLET | Freq: Every day | ORAL | Status: DC
  Administered 2014-06-12 – 2014-06-13 (×2): 40 mg via ORAL
  Filled 2014-06-10 (×3): qty 1

## 2014-06-10 MED ORDER — MORPHINE SULFATE 4 MG/ML IJ SOLN
4.0000 mg | Freq: Once | INTRAMUSCULAR | Status: AC
  Administered 2014-06-10: 4 mg via INTRAVENOUS
  Filled 2014-06-10: qty 1

## 2014-06-10 MED ORDER — FERROUS SULFATE 325 (65 FE) MG PO TABS
325.0000 mg | ORAL_TABLET | Freq: Every day | ORAL | Status: DC
Start: 2014-06-11 — End: 2014-06-13
  Administered 2014-06-12 – 2014-06-13 (×2): 325 mg via ORAL
  Filled 2014-06-10 (×5): qty 1

## 2014-06-10 MED ORDER — ACETAMINOPHEN 325 MG PO TABS: 650.0000 mg | ORAL_TABLET | Freq: Four times a day (QID) | ORAL | Status: DC | PRN

## 2014-06-10 MED ORDER — FENTANYL CITRATE 0.05 MG/ML IJ SOLN
25.0000 ug | Freq: Once | INTRAMUSCULAR | Status: AC
  Administered 2014-06-10: 25 ug via INTRAVENOUS
  Filled 2014-06-10: qty 2

## 2014-06-10 MED ORDER — ONDANSETRON HCL 4 MG/2ML IJ SOLN
4.0000 mg | Freq: Four times a day (QID) | INTRAMUSCULAR | Status: DC | PRN
  Administered 2014-06-12: 4 mg via INTRAVENOUS
  Filled 2014-06-10: qty 2

## 2014-06-10 MED ORDER — GUAIFENESIN ER 600 MG PO TB12
600.0000 mg | ORAL_TABLET | Freq: Every day | ORAL | Status: DC | PRN
  Filled 2014-06-10: qty 1

## 2014-06-10 MED ORDER — ALBUTEROL SULFATE (2.5 MG/3ML) 0.083% IN NEBU: 3.0000 mL | INHALATION_SOLUTION | RESPIRATORY_TRACT | Status: DC | PRN

## 2014-06-10 MED ORDER — DORZOLAMIDE HCL-TIMOLOL MAL 2-0.5 % OP SOLN: 1.0000 [drp] | Freq: Two times a day (BID) | OPHTHALMIC | Status: DC

## 2014-06-10 MED ORDER — AMLODIPINE BESYLATE 5 MG PO TABS
5.0000 mg | ORAL_TABLET | Freq: Every day | ORAL | Status: DC
  Filled 2014-06-10: qty 1

## 2014-06-10 MED ORDER — SODIUM CHLORIDE 0.9 % IV SOLN
INTRAVENOUS | Status: DC
  Administered 2014-06-10: 50 mL/h via INTRAVENOUS

## 2014-06-10 MED ORDER — FLUTICASONE PROPIONATE 50 MCG/ACT NA SUSP
1.0000 | Freq: Every day | NASAL | Status: DC
  Administered 2014-06-11 – 2014-06-14 (×4): 1 via NASAL
  Filled 2014-06-10: qty 16

## 2014-06-10 MED ORDER — DILTIAZEM LOAD VIA INFUSION
10.0000 mg | Freq: Once | INTRAVENOUS | Status: AC
  Administered 2014-06-10: 10 mg via INTRAVENOUS
  Filled 2014-06-10: qty 10

## 2014-06-10 MED ORDER — VITAMIN E 180 MG (400 UNIT) PO CAPS
400.0000 [IU] | ORAL_CAPSULE | Freq: Every day | ORAL | Status: DC
  Administered 2014-06-11 – 2014-06-14 (×4): 400 [IU] via ORAL
  Filled 2014-06-10 (×4): qty 1

## 2014-06-10 MED ORDER — GI COCKTAIL ~~LOC~~: 30.0000 mL | Freq: Once | ORAL | Status: DC

## 2014-06-10 NOTE — ED Notes (Signed)
Pt brought to ED by [facility] from home for c/o CP 8/10 on her mid chest going to her left side and left arm and back, pt c/o SOB too, denies any nausea or dizziness. 1 nitro given by [facility] with no relief. Pt has multiple Sx scars on her chest from a bypass sx. Pt Afib on [facility] EKG with HR from 80 -150's.

## 2014-06-10 NOTE — ED Provider Notes (Addendum)
CSN: [id number]     Arrival date & time 06/10/14  1917 History   First MD Initiated Contact with Patient 06/10/14 1925     Chief Complaint  Patient presents with  . Chest Pain  . Shortness of Breath      HPI  Patient has dementia. Level V caveat. Patient complains of pain in her chest, back, left arm. Onset is unclear   The patient was at her primary care physician's office, found to have atrial fibrillation with rapid ventricular response, sent here for evaluation. Patient does not recall even being at her physician's office.   Past Medical History  Diagnosis Date  . GERD (gastroesophageal reflux disease)   . Insomnia   . Eczema   . Glaucoma   . Diastolic dysfunction   . IHSS (idiopathic hypertrophic subaortic stenosis)     s/p septal myomectomy  . High cholesterol   . Coronary artery disease   . Blood transfusion   . Chronic kidney disease (CKD), stage III (moderate)   . Severe mitral regurgitation     s/p MVR with pericardial tissue valve  . Diverticulosis of colon with hemorrhage 08/12/2012  . GI bleed   . Asthma   . Hypertension   . Reflux esophagitis   . Pulmonary HTN     mild to moderate  . Aortic insufficiency   . Diastolic dysfunction   . Bradycardia     s/p PPM  . Atrial fibrillation and flutter     atrial fibrillation s/p DCCV 06-2012-  now in chronic atrial fibrillation off anticoagulation due to GI bleed  . Paroxysmal atrial tachycardia   . PVCs (premature ventricular contractions)   . DCM (dilated cardiomyopathy)      50% echo 06/2012  . Heart murmur    Past Surgical History  Procedure Laterality Date  . Cardiac catheterization    . Cardiac valve replacement  2007    MVR  . Myomectomy  2007    septal  . Tonsillectomy    . Appendectomy    . Cholecystectomy    . Tubal ligation    . Hernia repair      "in my stomach"  . Cataract extraction w/ intraocular lens  implant, bilateral    . Cardioversion  07/04/2012    Procedure: CARDIOVERSION;   Surgeon: Sueanne Margarita, MD;  Location: [facility];  Service: Cardiovascular;  Laterality: N/A;  h/p in file drawer   . Colonoscopy    . Colon resection    . Colonoscopy N/A 08/13/2012    Procedure: COLONOSCOPY;  Surgeon: Gatha Mayer, MD;  Location: [facility];  Service: Endoscopy;  Laterality: N/A;  . Permanent pacemaker insertion N/A 10/07/2011    Procedure: PERMANENT PACEMAKER INSERTION;  Surgeon: Deboraha Sprang, MD;  Location: [facility] CATH LAB;  Service: Cardiovascular;  Laterality: N/A;   Family History  Problem Relation Age of Onset  . CVA Mother   . Hypertension Mother   . CVA Brother   . Hypertension Brother    History  Substance Use Topics  . Smoking status: Former Smoker    Types: Cigarettes    Quit date: 06/07/1974  . Smokeless tobacco: Former Systems developer    Types: Snuff    Quit date: 04/23/1975     Comment: 10/05/11 "quit smoking & dipping snuff; been some years ago"  . Alcohol Use: No     Comment: 10/05/11 "used to drink; quit a long time ago"   OB History    No data available  Review of Systems  Unable to perform ROS: Dementia      Allergies  Cymbalta  Home Medications   Prior to Admission medications   Medication Sig Start Date End Date Taking? Authorizing Provider  acetaminophen (TYLENOL) 500 MG tablet Take 500 mg by mouth every 6 (six) hours as needed. For pain    Historical Provider, MD  albuterol (PROVENTIL HFA;VENTOLIN HFA) 108 (90 BASE) MCG/ACT inhaler Inhale 2 puffs into the lungs every 4 (four) hours as needed. For shortness of breath    Historical Provider, MD  amLODipine (NORVASC) 10 MG tablet Take 5 mg by mouth daily.     Historical Provider, MD  budesonide (PULMICORT) 180 MCG/ACT inhaler Inhale 2 puffs into the lungs as needed.    Historical Provider, MD  Calcium Carbonate-Vitamin D (CALTRATE 600+D) 600-400 MG-UNIT per tablet Take 1 tablet by mouth daily.    Historical Provider, MD  cetirizine (ZYRTEC) 10 MG tablet Take 10 mg by mouth at bedtime as  needed for allergies. For allergy symptoms    Historical Provider, MD  dexlansoprazole (DEXILANT) 60 MG capsule Take 60 mg by mouth every morning.     Historical Provider, MD  dorzolamide (TRUSOPT) 2 % ophthalmic solution Place 1 drop into both eyes 2 (two) times daily.    Historical Provider, MD  dorzolamide-timolol (COSOPT) 22.3-6.8 MG/ML ophthalmic solution  03/09/13   Historical Provider, MD  Ferrous Sulfate (IRON) 28 MG TABS Take 1 tablet by mouth daily.    Historical Provider, MD  fluticasone (FLONASE) 50 MCG/ACT nasal spray Place into both nostrils as needed for allergies or rhinitis.    Historical Provider, MD  FLUZONE HIGH-DOSE injection  05/08/13   Historical Provider, MD  guaiFENesin (MUCINEX) 600 MG 12 hr tablet Take 600 mg by mouth daily as needed. For congestion    Historical Provider, MD  lisinopril (PRINIVIL,ZESTRIL) 20 MG tablet Take 20 mg by mouth daily.    Historical Provider, MD  loperamide (IMODIUM) 1 MG/5ML solution Take by mouth as needed for diarrhea or loose stools.    Historical Provider, MD  LORazepam (ATIVAN) 1 MG tablet Take 1 mg by mouth at bedtime.     Historical Provider, MD  lovastatin (MEVACOR) 20 MG tablet Take 20 mg by mouth at bedtime.    Historical Provider, MD  memantine (NAMENDA) 10 MG tablet Take 10 mg by mouth 2 (two) times daily.    Historical Provider, MD  metoprolol succinate (TOPROL-XL) 25 MG 24 hr tablet TAKE ONE TABLET BY MOUTH ONCE DAILY 05/11/14   Sueanne Margarita, MD  Multiple Vitamins-Minerals (ALIVE WOMENS 50+ PO) Take by mouth daily.    Historical Provider, MD  omeprazole (PRILOSEC) 20 MG capsule Take 20 mg by mouth daily.    Historical Provider, MD  pantoprazole (PROTONIX) 40 MG tablet Take 40 mg by mouth daily. 01/29/14   Historical Provider, MD  Simethicone (GAS-X EXTRA STRENGTH) 125 MG CAPS Take 125 mg by mouth as needed.    Historical Provider, MD  vitamin E 400 UNIT capsule Take 400 Units by mouth daily.    Historical Provider, MD   BP 160/103  mmHg  Pulse 86  Temp(Src) 98.8 F (37.1 C) (Oral)  Resp 25  Ht 5\' 8"  (1.727 m)  Wt 173 lb (78.472 kg)  BMI 26.31 kg/m2  SpO2 96% Physical Exam  Constitutional: She appears well-developed and well-nourished. No distress.  HENT:  Head: Normocephalic and atraumatic.  Eyes: Conjunctivae and EOM are normal.  Cardiovascular: An irregularly irregular rhythm  present. Tachycardia present.   Pulmonary/Chest: Effort normal and breath sounds normal. No stridor. No respiratory distress.  Abdominal: She exhibits no distension.  Musculoskeletal: She exhibits no edema.  Neurological: She is alert.  Moves all extremities spontaneously, but is oriented only to self, grossly to place. Speech is clear, face is symmetric,  Skin: Skin is warm and dry.  Psychiatric: Cognition and memory are impaired.  Nursing note and vitals reviewed.   ED Course  Procedures (including critical care time) Labs Review Labs Reviewed  TROPONIN I - Abnormal; Notable for the following:    Troponin I 0.42 (*)    All other components within normal limits  BRAIN NATRIURETIC PEPTIDE - Abnormal; Notable for the following:    B Natriuretic Peptide 143.5 (*)    All other components within normal limits  CBC - Abnormal; Notable for the following:    WBC 11.6 (*)    All other components within normal limits  BASIC METABOLIC PANEL - Abnormal; Notable for the following:    Glucose, Bld 129 (*)    GFR calc non Af Amer 44 (*)    GFR calc Af Amer 51 (*)    All other components within normal limits  MAGNESIUM  TSH   After return of the initial labs, and initiation of Cardizem drip, and discussed patient's case with the patient's cardiologist. We discussed the patient's history of GI bleeding following attempts to anticoagulate.  Patient is not a candidate for any additional anticoagulation.  On repeat exam the patient has a daughter present. We discussed all findings at length. Patient's daughter reaffirms that the patient  is DO NOT RESUSCITATE.  Imaging Review Dg Chest Port 1 View  06/10/2014   CLINICAL DATA:  Shortness of breath and central chest pain for 1 day.  EXAM: PORTABLE CHEST - 1 VIEW  COMPARISON:  07/08/2012.  FINDINGS: Interval mildly enlarged cardiac silhouette with increased prominence of the pulmonary vasculature and interstitial markings. Small bilateral pleural effusions. Stable median sternotomy wires and left subclavian pacemaker leads. Mild right shoulder degenerative changes.  IMPRESSION: Interval mild cardiomegaly and mild changes of congestive heart failure.   Electronically Signed   By: Enrique Sack M.D.   On: 06/10/2014 20:23      I reviewed the rhythm strip from prior to arrival.  Patient had rapid ventricular rate, atrial fibrillation, rate in the 130s, 140s.  10:01 PM Heart rate 95. Patient seems substantially more comfortable.  I discussed patient's case with our hospitalist team. Given the patient's Cardizem drip, she'll be admitted to a stepdown bed. With a history of GI bleed, patient does not have other therapeutic options for anticoagulation. However, the patient will continue to receive her Cardizem for rate control.  EKG atrial fibrillation, 111, premature ventricular contractions, abnormal   MDM   Final diagnoses:  Chest pain   atrial fibrillation with rapid ventricular response.  Patient presents with pain in multiple areas, including chest pain, and is found to be in atrial fibrillation with rapid ventricular response. Agents comorbidities including GI bleed, prohibiting additional anticoagulation. Patient's heart rate decreased substantially, and she looked appreciably more comfortable after interventions here. However, given the patient's Cardizem drip, she was admitted to the step down unit for further evaluation and management after discussed her case with both cardiology and the hospitalist team.  CRITICAL CARE Performed by: Carmin Muskrat Total critical  care time: 35 Critical care time was exclusive of separately billable procedures and treating other patients. Critical care was necessary to  treat or prevent imminent or life-threatening deterioration. Critical care was time spent personally by me on the following activities: development of treatment plan with patient and/or surrogate as well as nursing, discussions with consultants, evaluation of patient's response to treatment, examination of patient, obtaining history from patient or surrogate, ordering and performing treatments and interventions, ordering and review of laboratory studies, ordering and review of radiographic studies, pulse oximetry and re-evaluation of patient's condition.     Carmin Muskrat, MD 06/10/14 [id number]  Carmin Muskrat, MD 06/10/14 [id number]

## 2014-06-10 NOTE — H&P (Signed)
[facility]                                                                                    Patient Demographics  Jasmine [name], is a 81 y.o. female  CSN: [id number]  MRN: [id number]  DOB - [birth year]  Admit Date - 06/10/2014  Outpatient Primary MD for the patient is [name], MD   With History of -  Past Medical History  Diagnosis Date  . GERD (gastroesophageal reflux disease)   . Insomnia   . Eczema   . Glaucoma   . Diastolic dysfunction   . IHSS (idiopathic hypertrophic subaortic stenosis)     s/p septal myomectomy  . High cholesterol   . Coronary artery disease   . Blood transfusion   . Chronic kidney disease (CKD), stage III (moderate)   . Severe mitral regurgitation     s/p MVR with pericardial tissue valve  . Diverticulosis of colon with hemorrhage 08/12/2012  . GI bleed   . Asthma   . Hypertension   . Reflux esophagitis   . Pulmonary HTN     mild to moderate  . Aortic insufficiency   . Diastolic dysfunction   . Bradycardia     s/p PPM  . Atrial fibrillation and flutter     atrial fibrillation s/p DCCV 06-2012-  now in chronic atrial fibrillation off anticoagulation due to GI bleed  . Paroxysmal atrial tachycardia   . PVCs (premature ventricular contractions)   . DCM (dilated cardiomyopathy)      50% echo 06/2012  . Heart murmur       Past Surgical History  Procedure Laterality Date  . Cardiac catheterization    . Cardiac valve replacement  2007    MVR  . Myomectomy  2007    septal  . Tonsillectomy    . Appendectomy    . Cholecystectomy    . Tubal ligation    . Hernia repair      "in my stomach"  . Cataract extraction w/ intraocular lens  implant, bilateral    . Cardioversion  07/04/2012    Procedure: CARDIOVERSION;  Surgeon: Sueanne Margarita, MD;  Location: [place];  Service: Cardiovascular;  Laterality: N/A;  h/p in file drawer   . Colonoscopy    . Colon resection    . Colonoscopy N/A 08/13/2012    Procedure:  COLONOSCOPY;  Surgeon: Gatha Mayer, MD;  Location: [place];  Service: Endoscopy;  Laterality: N/A;  . Permanent pacemaker insertion N/A 10/07/2011    Procedure: PERMANENT PACEMAKER INSERTION;  Surgeon: Deboraha Sprang, MD;  Location: [facility] CATH LAB;  Service: Cardiovascular;  Laterality: N/A;    in for   Chief Complaint  Patient presents with  . Chest Pain  . Shortness of Breath     HPI  Jasmine [name]  is a 81 y.o. female, with past medical history significant for IHSS and mitral valve regurgitation status post repair presenting with chest pain radiating to the back and left arm of one day duration the patient lives with her daughter and has a history of dementia. She reports associated nausea with dry heaves and cold sweats. No  relieving or exacerbating factors. She went to her primary care physician's office and she was found to have atrial fibrillation with rapid ventricular rate so she was advised to come to the emergency room and was started on IV Cardizem drip patient denies chest pain at the moment. Agent has history of GI bleed and is not a candidate for anticoagulation. Cardiology saw the patient in the emergency room and will follow after admission. The patient in the emergency room insisted on being DO NOT RESUSCITATE    Review of Systems    In addition to the HPI above,  No Fever-chills, No Headache,  No problems swallowing food or Liquids, No Abdominal pain, No  Vommitting, had 2 episodes of loose stool yesterday, No Blood in stool or Urine, No dysuria, No new skin rashes or bruises, No new joints pains-aches,  No new weakness, tingling, numbness in any extremity, No recent weight gain or loss, No polyuria, polydypsia or polyphagia, No significant Mental Stressors.  A full 10 point Review of Systems was done, except as stated above, all other Review of Systems were negative.   Social History History  Substance Use Topics  . Smoking status: Former Smoker     Types: Cigarettes    Quit date: 06/07/1974  . Smokeless tobacco: Former Systems developer    Types: Snuff    Quit date: 04/23/1975     Comment: 10/05/11 "quit smoking & dipping snuff; been some years ago"  . Alcohol Use: No     Comment: 10/05/11 "used to drink; quit a long time ago"     Family History Family History  Problem Relation Age of Onset  . CVA Mother   . Hypertension Mother   . CVA Brother   . Hypertension Brother      Prior to Admission medications   Medication Sig Start Date End Date Taking? Authorizing Provider  acetaminophen (TYLENOL) 500 MG tablet Take 500 mg by mouth every 6 (six) hours as needed. For pain    Historical Provider, MD  albuterol (PROVENTIL HFA;VENTOLIN HFA) 108 (90 BASE) MCG/ACT inhaler Inhale 2 puffs into the lungs every 4 (four) hours as needed. For shortness of breath    Historical Provider, MD  amLODipine (NORVASC) 10 MG tablet Take 5 mg by mouth daily.     Historical Provider, MD  budesonide (PULMICORT) 180 MCG/ACT inhaler Inhale 2 puffs into the lungs as needed.    Historical Provider, MD  Calcium Carbonate-Vitamin D (CALTRATE 600+D) 600-400 MG-UNIT per tablet Take 1 tablet by mouth daily.    Historical Provider, MD  cetirizine (ZYRTEC) 10 MG tablet Take 10 mg by mouth at bedtime as needed for allergies. For allergy symptoms    Historical Provider, MD  dexlansoprazole (DEXILANT) 60 MG capsule Take 60 mg by mouth every morning.     Historical Provider, MD  dorzolamide (TRUSOPT) 2 % ophthalmic solution Place 1 drop into both eyes 2 (two) times daily.    Historical Provider, MD  dorzolamide-timolol (COSOPT) 22.3-6.8 MG/ML ophthalmic solution  03/09/13   Historical Provider, MD  Ferrous Sulfate (IRON) 28 MG TABS Take 1 tablet by mouth daily.    Historical Provider, MD  fluticasone (FLONASE) 50 MCG/ACT nasal spray Place into both nostrils as needed for allergies or rhinitis.    Historical Provider, MD  FLUZONE HIGH-DOSE injection  05/08/13   Historical Provider, MD   guaiFENesin (MUCINEX) 600 MG 12 hr tablet Take 600 mg by mouth daily as needed. For congestion    Historical Provider, MD  lisinopril (PRINIVIL,ZESTRIL) 20 MG tablet Take 20 mg by mouth daily.    Historical Provider, MD  loperamide (IMODIUM) 1 MG/5ML solution Take by mouth as needed for diarrhea or loose stools.    Historical Provider, MD  LORazepam (ATIVAN) 1 MG tablet Take 1 mg by mouth at bedtime.     Historical Provider, MD  lovastatin (MEVACOR) 20 MG tablet Take 20 mg by mouth at bedtime.    Historical Provider, MD  memantine (NAMENDA) 10 MG tablet Take 10 mg by mouth 2 (two) times daily.    Historical Provider, MD  metoprolol succinate (TOPROL-XL) 25 MG 24 hr tablet TAKE ONE TABLET BY MOUTH ONCE DAILY 05/11/14   Sueanne Margarita, MD  Multiple Vitamins-Minerals (ALIVE WOMENS 50+ PO) Take by mouth daily.    Historical Provider, MD  omeprazole (PRILOSEC) 20 MG capsule Take 20 mg by mouth daily.    Historical Provider, MD  pantoprazole (PROTONIX) 40 MG tablet Take 40 mg by mouth daily. 01/29/14   Historical Provider, MD  Simethicone (GAS-X EXTRA STRENGTH) 125 MG CAPS Take 125 mg by mouth as needed.    Historical Provider, MD  vitamin E 400 UNIT capsule Take 400 Units by mouth daily.    Historical Provider, MD    Allergies  Allergen Reactions  . Cymbalta [Duloxetine Hcl]     Diarrhea, weakness    Physical Exam  Vitals  Blood pressure 113/80, pulse 102, temperature 98.1 F (36.7 C), temperature source Oral, resp. rate 23, height 5\' 8"  (1.727 m), weight 77.9 kg (171 lb 11.8 oz), SpO2 97 %.   1. General elderly female, well-developed, looks chronically ill  2. Pleasantly confused.  3. No F.N deficits, grossly, ALL C.Nerves Intact,   4. Ears and Eyes appear Normal, Conjunctivae clear, Moist Oral Mucosa.  5. Supple Neck, No JVD, No cervical lymphadenopathy appriciated, No Carotid Bruits.  6. Symmetrical Chest wall movement, decreased breath sounds bilaterally. Midsternotomy  keloids  7. Irregularly irregular, positive systolic murmur, No Parasternal Heave.  8. Positive Bowel Sounds, Abdomen Soft, Non tender, No organomegaly appriciated,No rebound -guarding or rigidity.  9.  No Cyanosis, Normal Skin Turgor, No Skin Rash or Bruise.  10. Good muscle tone,  joints appear normal , no effusions, Normal ROM.  11. No Palpable Lymph Nodes in Neck or Axillae    Data Review  CBC  Recent Labs Lab 06/10/14 1952  WBC 11.6*  HGB 13.4  HCT 41.0  PLT 215  MCV 90.7  MCH 29.6  MCHC 32.7  RDW 14.2   ------------------------------------------------------------------------------------------------------------------  Chemistries   Recent Labs Lab 06/10/14 1952  NA 138  K 3.9  CL 108  CO2 21  GLUCOSE 129*  BUN 7  CREATININE 1.09  CALCIUM 9.1  MG 2.2   ------------------------------------------------------------------------------------------------------------------ estimated creatinine clearance is 39.9 mL/min (by C-G formula based on Cr of 1.09). ------------------------------------------------------------------------------------------------------------------  Recent Labs  06/10/14 1952  TSH 2.320     Coagulation profile No results for input(s): INR, PROTIME in the last 168 hours. ------------------------------------------------------------------------------------------------------------------- No results for input(s): DDIMER in the last 72 hours. -------------------------------------------------------------------------------------------------------------------  Cardiac Enzymes  Recent Labs Lab 06/10/14 1952  TROPONINI 0.42*   ------------------------------------------------------------------------------------------------------------------ Invalid input(s): POCBNP   ---------------------------------------------------------------------------------------------------------------  Urinalysis    Component Value Date/Time   COLORURINE  YELLOW 08/12/2012 1406   APPEARANCEUR CLEAR 08/12/2012 1406   LABSPEC 1.018 08/12/2012 1406   PHURINE 6.5 08/12/2012 1406   GLUCOSEU NEGATIVE 08/12/2012 1406   HGBUR NEGATIVE 08/12/2012 1406   [place] 08/12/2012 1406  KETONESUR NEGATIVE 08/12/2012 1406   PROTEINUR NEGATIVE 08/12/2012 1406   UROBILINOGEN 0.2 08/12/2012 1406   NITRITE NEGATIVE 08/12/2012 1406   LEUKOCYTESUR MODERATE* 08/12/2012 1406    ----------------------------------------------------------------------------------------------------------------   Imaging results:   Dg Chest Port 1 View  06/10/2014   CLINICAL DATA:  Shortness of breath and central chest pain for 1 day.  EXAM: PORTABLE CHEST - 1 VIEW  COMPARISON:  07/08/2012.  FINDINGS: Interval mildly enlarged cardiac silhouette with increased prominence of the pulmonary vasculature and interstitial markings. Small bilateral pleural effusions. Stable median sternotomy wires and left subclavian pacemaker leads. Mild right shoulder degenerative changes.  IMPRESSION: Interval mild cardiomegaly and mild changes of congestive heart failure.   Electronically Signed   By: Enrique Sack M.D.   On: 06/10/2014 20:23    My personal review of EKG: Atrial fibrillation with rapid ventricular rate and left bundle branch block    Assessment & Plan  1. Atrial fibrillation with rapid ventricular rate, not a candidate for anticoagulation due to bleeding 2. Mild congestive heart failure 3. IHSS 4. Mitral valve regurgitation status post surgery in the past 5. Elevated troponin probably due to rate  Plan  Admit to step down Cardizem drip Lopressor by mouth  Cardiology will follow Serial troponins   DVT Prophylaxis Heparin   AM Labs Ordered, also please review Full Orders  Code Status DO NOT RESUSCITATE  Disposition Plan: Home with daughter  Time spent in minutes : 37 minutes  Condition GUARDED    @SIGNATURE @

## 2014-06-11 ENCOUNTER — Encounter (HOSPITAL_COMMUNITY)
Admission: EM | Disposition: A | Discharge status: Home / Self Care | Payer: Self-pay | Source: Home / Self Care | Attending: Internal Medicine

## 2014-06-11 DIAGNOSIS — I5042 Chronic combined systolic (congestive) and diastolic (congestive) heart failure: Secondary | ICD-10-CM | POA: Diagnosis present

## 2014-06-11 DIAGNOSIS — R7989 Other specified abnormal findings of blood chemistry: Secondary | ICD-10-CM

## 2014-06-11 DIAGNOSIS — I421 Obstructive hypertrophic cardiomyopathy: Secondary | ICD-10-CM | POA: Diagnosis present

## 2014-06-11 DIAGNOSIS — I2489 Other forms of acute ischemic heart disease: Secondary | ICD-10-CM | POA: Diagnosis present

## 2014-06-11 DIAGNOSIS — I2510 Atherosclerotic heart disease of native coronary artery without angina pectoris: Secondary | ICD-10-CM | POA: Diagnosis not present

## 2014-06-11 DIAGNOSIS — R778 Other specified abnormalities of plasma proteins: Secondary | ICD-10-CM | POA: Diagnosis present

## 2014-06-11 DIAGNOSIS — I214 Non-ST elevation (NSTEMI) myocardial infarction: Secondary | ICD-10-CM

## 2014-06-11 DIAGNOSIS — I4891 Unspecified atrial fibrillation: Secondary | ICD-10-CM

## 2014-06-11 DIAGNOSIS — I248 Other forms of acute ischemic heart disease: Secondary | ICD-10-CM | POA: Diagnosis present

## 2014-06-11 HISTORY — PX: LEFT HEART CATHETERIZATION WITH CORONARY ANGIOGRAM: SHX5451

## 2014-06-11 LAB — BASIC METABOLIC PANEL
Anion gap: 10 (ref 5–15)
BUN: 6 mg/dL (ref 6–23)
CALCIUM: 8.9 mg/dL (ref 8.4–10.5)
CO2: 22 mmol/L (ref 19–32)
Chloride: 107 mEq/L (ref 96–112)
Creatinine, Ser: 0.9 mg/dL (ref 0.50–1.10)
GFR calc Af Amer: 65 mL/min — ABNORMAL LOW (ref >90)
GFR calc non Af Amer: 56 mL/min — ABNORMAL LOW (ref >90)
Glucose, Bld: 121 mg/dL — ABNORMAL HIGH (ref 70–99)
Potassium: 4.1 mmol/L (ref 3.5–5.1)
SODIUM: 139 mmol/L (ref 135–145)

## 2014-06-11 LAB — CBC
HEMATOCRIT: 38.2 % (ref 36.0–46.0)
HEMOGLOBIN: 12.5 g/dL (ref 12.0–15.0)
MCH: 29.5 pg (ref 26.0–34.0)
MCHC: 32.7 g/dL (ref 30.0–36.0)
MCV: 90.1 fL (ref 78.0–100.0)
PLATELETS: 226 10*3/uL (ref 150–400)
RBC: 4.24 MIL/uL (ref 3.87–5.11)
RDW: 14.1 % (ref 11.5–15.5)
WBC: 10.3 10*3/uL (ref 4.0–10.5)

## 2014-06-11 LAB — TROPONIN I
TROPONIN I: 11.65 ng/mL — AB (ref <0.031)
Troponin I: 8.51 ng/mL (ref <0.031)
Troponin I: 10.8 ng/mL (ref <0.031)

## 2014-06-11 LAB — PROTIME-INR
INR: 1.07 (ref 0.00–1.49)
Prothrombin Time: 14 seconds (ref 11.6–15.2)

## 2014-06-11 LAB — MRSA PCR SCREENING: MRSA by PCR: NEGATIVE

## 2014-06-11 LAB — POCT ACTIVATED CLOTTING TIME: Activated Clotting Time: 122 seconds

## 2014-06-11 SURGERY — LEFT HEART CATHETERIZATION WITH CORONARY ANGIOGRAM

## 2014-06-11 MED ORDER — ASPIRIN 81 MG PO CHEW
324.0000 mg | CHEWABLE_TABLET | Freq: Once | ORAL | Status: AC
  Administered 2014-06-11: 324 mg via ORAL
  Filled 2014-06-11: qty 4

## 2014-06-11 MED ORDER — FUROSEMIDE 10 MG/ML IJ SOLN
40.0000 mg | Freq: Every day | INTRAMUSCULAR | Status: DC
  Administered 2014-06-11: 40 mg via INTRAVENOUS
  Filled 2014-06-11 (×2): qty 4

## 2014-06-11 MED ORDER — SODIUM CHLORIDE 0.9 % IV SOLN
INTRAVENOUS | Status: DC
  Administered 2014-06-11 – 2014-06-13 (×3): via INTRAVENOUS

## 2014-06-11 MED ORDER — NITROGLYCERIN 1 MG/10 ML FOR IR/CATH LAB
INTRA_ARTERIAL | Status: AC
  Filled 2014-06-11: qty 10

## 2014-06-11 MED ORDER — MIDAZOLAM HCL 2 MG/2ML IJ SOLN
INTRAMUSCULAR | Status: AC
  Filled 2014-06-11: qty 2

## 2014-06-11 MED ORDER — MORPHINE SULFATE 2 MG/ML IJ SOLN
1.0000 mg | Freq: Once | INTRAMUSCULAR | Status: AC
  Administered 2014-06-11: 1 mg via INTRAVENOUS
  Filled 2014-06-11: qty 1

## 2014-06-11 MED ORDER — HEPARIN BOLUS VIA INFUSION
4000.0000 [IU] | Freq: Once | INTRAVENOUS | Status: DC
  Filled 2014-06-11: qty 4000

## 2014-06-11 MED ORDER — HEPARIN (PORCINE) IN NACL 100-0.45 UNIT/ML-% IJ SOLN: 900.0000 [IU]/h | INTRAMUSCULAR | Status: DC

## 2014-06-11 MED ORDER — FENTANYL CITRATE 0.05 MG/ML IJ SOLN
INTRAMUSCULAR | Status: AC
  Filled 2014-06-11: qty 2

## 2014-06-11 MED ORDER — ACETAMINOPHEN 325 MG PO TABS: 650.0000 mg | ORAL_TABLET | ORAL | Status: DC | PRN

## 2014-06-11 MED ORDER — NITROGLYCERIN 0.4 MG SL SUBL
0.4000 mg | SUBLINGUAL_TABLET | SUBLINGUAL | Status: DC | PRN
  Administered 2014-06-12: 0.4 mg via SUBLINGUAL
  Filled 2014-06-11: qty 1

## 2014-06-11 MED ORDER — LIDOCAINE HCL (PF) 1 % IJ SOLN
INTRAMUSCULAR | Status: AC
  Filled 2014-06-11: qty 30

## 2014-06-11 MED ORDER — HEPARIN (PORCINE) IN NACL 100-0.45 UNIT/ML-% IJ SOLN
900.0000 [IU]/h | INTRAMUSCULAR | Status: DC
  Administered 2014-06-12: 900 [IU]/h via INTRAVENOUS
  Filled 2014-06-11: qty 250

## 2014-06-11 MED ORDER — NITROGLYCERIN 0.4 MG SL SUBL
SUBLINGUAL_TABLET | SUBLINGUAL | Status: AC
  Administered 2014-06-11: 12:00:00
  Filled 2014-06-11: qty 1

## 2014-06-11 MED ORDER — ASPIRIN EC 81 MG PO TBEC: 81.0000 mg | DELAYED_RELEASE_TABLET | Freq: Every day | ORAL | Status: DC

## 2014-06-11 MED ORDER — ONDANSETRON HCL 4 MG/2ML IJ SOLN: 4.0000 mg | Freq: Four times a day (QID) | INTRAMUSCULAR | Status: DC | PRN

## 2014-06-11 MED ORDER — ASPIRIN 81 MG PO CHEW
81.0000 mg | CHEWABLE_TABLET | Freq: Every day | ORAL | Status: DC
  Administered 2014-06-11 – 2014-06-14 (×4): 81 mg via ORAL
  Filled 2014-06-11 (×4): qty 1

## 2014-06-11 MED ORDER — NITROGLYCERIN 0.4 MG/HR TD PT24
0.4000 mg | MEDICATED_PATCH | Freq: Every day | TRANSDERMAL | Status: DC
  Administered 2014-06-11: 0.4 mg via TRANSDERMAL
  Filled 2014-06-11 (×2): qty 1

## 2014-06-11 MED ORDER — HEPARIN (PORCINE) IN NACL 100-0.45 UNIT/ML-% IJ SOLN
900.0000 [IU]/h | INTRAMUSCULAR | Status: DC
  Administered 2014-06-11: 900 [IU]/h via INTRAVENOUS
  Filled 2014-06-11 (×2): qty 250

## 2014-06-11 MED ORDER — HEPARIN (PORCINE) IN NACL 2-0.9 UNIT/ML-% IJ SOLN
INTRAMUSCULAR | Status: AC
  Filled 2014-06-11: qty 1500

## 2014-06-11 NOTE — Care Management Note (Addendum)
    Page 1 of 1   06/14/2014     3:23:30 PM CARE MANAGEMENT NOTE 06/14/2014  Patient:  Jasmine [name], Jasmine [name]   Account Number:  [id number]  Date Initiated:  06/11/2014  Documentation initiated by:  Elissa Hefty  Subjective/Objective Assessment:   adm w at fib     Action/Plan:   lives w fam, pcp dr Butch Penny [place]  pt eval- rec hhpt   Anticipated DC Date:  06/14/2014   Anticipated DC Plan:  [place]  CM consult      [facility] Choice  HOME HEALTH   Choice offered to / List presented to:  C-1 Patient        [place] arranged  HH-2 PT      [place].   Status of service:  Completed, signed off Medicare Important Message given?  YES (If response is "NO", the following Medicare IM given date fields will be blank) Date Medicare IM given:  06/13/2014 Medicare IM given by:  Elissa Hefty Date Additional Medicare IM given:   Additional Medicare IM given by:    Discharge Disposition:  [place]  Per UR Regulation:  Reviewed for med. necessity/level of care/duration of stay  If discussed at [place] of Stay Meetings, dates discussed:    Comments:  06/14/14 [place] ,BSN [phone] patient lives with daughter, she is for dc today, physical therapy rec hhpt, patient [facility], referral made to [facility], [place] notified.  Soc will begin 24-48 hrs post dc.

## 2014-06-11 NOTE — Progress Notes (Signed)
Pt c/o 7 out of 10 chest pain pointing on right chest radiating to  right shoulder and also pain on her mid upper back,Dr. Sherral Hammers at bedside, nitroSL given and STAT EKG done with result rhythm undetermined , LBBB, with With BP of 135/ 71, A. Fib HR at 60's-70's will continue to monitor.

## 2014-06-11 NOTE — Progress Notes (Signed)
Pt is c/o of chest again and pain on her shoulder blade and back,MD made aware and ordered nitro patch and morphine 1mg  IV x1.

## 2014-06-11 NOTE — Consult Note (Signed)
Primary Cardiologist: Radford Pax Reason for Consultation: NSTEMI  HPI:  Jasmine [name] is a 81 y.o. female with a history of HTN, chronic atrial fibrillation (not on warfarin due to recurrent GI bleeds), LBBB, HOCM, bradycardia s/p PPM, severe MR s/p pericardial tissue MVR, mild AI and diastolic dysfunction  Overall she has been doing well. Lives with her daughter and able to do all ADLs.Denies recent exertional CP. Tonight had sudden onset of chest pain radiating to the back and left arm associated with diaphoresis and nausea. She went to her primary care physician's office and she was found to have atrial fibrillation with rapid ventricular rate so she was advised to come to the emergency room and was started on IV Cardizem drip. Was pain free at the time. Initial troponin 0.42. Apparently case d/w Dt. Turner in ER and admitted to [facility] service. We we recontacted due to f/u troponin 8.51. Currently pain free. ECG with chronic LBBB. AF rate now in 70s.  She has history of GI bleed with coumadin and was seen by Dr. Collene Mares in past and told never to take coumadin again. She denies any recurrent GI bleeding.  DO NOT RESUSCITATE    Review of Systems:     Cardiac Review of Systems: {Y] = yes [ ]  = no  Chest Pain [ y   ]  Resting SOB [ y  ] Exertional SOB  [  ]  Orthopnea [  ]   Pedal Edema [   ]    Palpitations [  ] Syncope  [  ]   Presyncope [   ]  General Review of Systems: [Y] = yes [  ]=no Constitional: recent weight change [  ]; anorexia [  ]; fatigue [  ]; nausea [ y ]; night sweats [  ]; fever [  ]; or chills [  ];                                                                     Eyes : blurred vision [  ]; diplopia [   ]; vision changes [  ];  Amaurosis fugax[  ]; Resp: cough [  ];  wheezing[  ];  hemoptysis[  ];  PND [  ];  GI:  gallstones[  ], vomiting[ y;  dysphagia[  ]; melena[  ];  hematochezia [  ]; heartburn[  ];   GU: kidney stones [  ]; hematuria[  ];   dysuria [  ];   nocturia[  ]; incontinence [  ];             Skin: rash, swelling[  ];, hair loss[  ];  peripheral edema[  ];  or itching[  ]; Musculosketetal: myalgias[  ];  joint swelling[  ];  joint erythema[  ];  joint pain[  ];  back pain[  ];  Heme/Lymph: bruising[  ];  bleeding[  ];  anemia[  ];  Neuro: TIA[  ];  headaches[  ];  stroke[  ];  vertigo[  ];  seizures[  ];   paresthesias[  ];  difficulty walking[  ];  Psych:depression[  ]; anxiety[  ];  Endocrine: diabetes[  ];  thyroid dysfunction[  ];  Other:  Past Medical History  Diagnosis Date  . GERD (gastroesophageal reflux disease)   . Insomnia   . Eczema   . Glaucoma   . Diastolic dysfunction   . IHSS (idiopathic hypertrophic subaortic stenosis)     s/p septal myomectomy  . High cholesterol   . Coronary artery disease   . Blood transfusion   . Chronic kidney disease (CKD), stage III (moderate)   . Severe mitral regurgitation     s/p MVR with pericardial tissue valve  . Diverticulosis of colon with hemorrhage 08/12/2012  . GI bleed   . Asthma   . Hypertension   . Reflux esophagitis   . Pulmonary HTN     mild to moderate  . Aortic insufficiency   . Diastolic dysfunction   . Bradycardia     s/p PPM  . Atrial fibrillation and flutter     atrial fibrillation s/p DCCV 06-2012-  now in chronic atrial fibrillation off anticoagulation due to GI bleed  . Paroxysmal atrial tachycardia   . PVCs (premature ventricular contractions)   . DCM (dilated cardiomyopathy)      50% echo 06/2012  . Heart murmur     Medications Prior to Admission  Medication Sig Dispense Refill  . acetaminophen (TYLENOL) 500 MG tablet Take 500 mg by mouth every 6 (six) hours as needed. For pain    . albuterol (PROVENTIL HFA;VENTOLIN HFA) 108 (90 BASE) MCG/ACT inhaler Inhale 2 puffs into the lungs every 4 (four) hours as needed. For shortness of breath    . amLODipine (NORVASC) 10 MG tablet Take 5 mg by mouth daily.     . budesonide (PULMICORT) 180 MCG/ACT inhaler  Inhale 2 puffs into the lungs as needed.    . Calcium Carbonate-Vitamin D (CALTRATE 600+D) 600-400 MG-UNIT per tablet Take 1 tablet by mouth daily.    . cetirizine (ZYRTEC) 10 MG tablet Take 10 mg by mouth at bedtime as needed for allergies. For allergy symptoms    . dexlansoprazole (DEXILANT) 60 MG capsule Take 60 mg by mouth every morning.     . dorzolamide (TRUSOPT) 2 % ophthalmic solution Place 1 drop into both eyes 2 (two) times daily.    . dorzolamide-timolol (COSOPT) 22.3-6.8 MG/ML ophthalmic solution     . Ferrous Sulfate (IRON) 28 MG TABS Take 1 tablet by mouth daily.    . fluticasone (FLONASE) 50 MCG/ACT nasal spray Place into both nostrils as needed for allergies or rhinitis.    Marland Kitchen FLUZONE HIGH-DOSE injection     . guaiFENesin (MUCINEX) 600 MG 12 hr tablet Take 600 mg by mouth daily as needed. For congestion    . lisinopril (PRINIVIL,ZESTRIL) 20 MG tablet Take 20 mg by mouth daily.    Marland Kitchen loperamide (IMODIUM) 1 MG/5ML solution Take by mouth as needed for diarrhea or loose stools.    Marland Kitchen LORazepam (ATIVAN) 1 MG tablet Take 1 mg by mouth at bedtime.     . lovastatin (MEVACOR) 20 MG tablet Take 20 mg by mouth at bedtime.    . memantine (NAMENDA) 10 MG tablet Take 10 mg by mouth 2 (two) times daily.    . metoprolol succinate (TOPROL-XL) 25 MG 24 hr tablet TAKE ONE TABLET BY MOUTH ONCE DAILY 30 tablet 9  . Multiple Vitamins-Minerals (ALIVE WOMENS 50+ PO) Take by mouth daily.    Marland Kitchen omeprazole (PRILOSEC) 20 MG capsule Take 20 mg by mouth daily.    . pantoprazole (PROTONIX) 40 MG tablet Take 40 mg by mouth daily.    . Simethicone (GAS-X  EXTRA STRENGTH) 125 MG CAPS Take 125 mg by mouth as needed.    . vitamin E 400 UNIT capsule Take 400 Units by mouth daily.       . sodium chloride   Intravenous STAT  . amLODipine  5 mg Oral Daily  . aspirin  81 mg Oral Daily  . dorzolamide  1 drop Both Eyes BID  . ferrous sulfate  325 mg Oral Q breakfast  . fluticasone  1 spray Each Nare Daily  .  fluticasone  2 puff Inhalation BID  . furosemide  40 mg Intravenous Daily  . gi cocktail  30 mL Oral Once  . lisinopril  20 mg Oral Daily  . loratadine  10 mg Oral Daily  . LORazepam  1 mg Oral QHS  . memantine  10 mg Oral BID  . metoprolol tartrate  25 mg Oral BID  . pantoprazole  40 mg Oral Daily  . pantoprazole  40 mg Oral Daily  . pravastatin  10 mg Oral q1800  . sodium chloride  3 mL Intravenous Q12H  . vitamin E  400 Units Oral Daily    Infusions: . diltiazem (CARDIZEM) infusion 15 mg/hr (06/10/14 2245)  . heparin      Allergies  Allergen Reactions  . Cymbalta [Duloxetine Hcl]     Diarrhea, weakness    History   Social History  . Marital Status: Widowed    Spouse Name: N/A    Number of Children: 3  . Years of Education: N/A   Occupational History  . Not on file.   Social History Main Topics  . Smoking status: Former Smoker    Types: Cigarettes    Quit date: 06/07/1974  . Smokeless tobacco: Former Systems developer    Types: Snuff    Quit date: 04/23/1975     Comment: 10/05/11 "quit smoking & dipping snuff; been some years ago"  . Alcohol Use: No     Comment: 10/05/11 "used to drink; quit a long time ago"  . Drug Use: No  . Sexual Activity: No   Other Topics Concern  . Not on file   Social History Narrative    Family History  Problem Relation Age of Onset  . CVA Mother   . Hypertension Mother   . CVA Brother   . Hypertension Brother     PHYSICAL EXAM: Filed Vitals:   06/11/14 0400  BP: 148/75  Pulse: 73  Temp: 97.6 F (36.4 C)  Resp: 24     Intake/Output Summary (Last 24 hours) at 06/11/14 0515 Last data filed at 06/11/14 0300  Gross per 24 hour  Intake     45 ml  Output    450 ml  Net   -405 ml    General:  Well appearing. No respiratory difficulty HEENT: normal Neck: supple. no JVD. Carotids 2+ bilat; no bruits. No lymphadenopathy or thryomegaly appreciated. Cor: PMI nondisplaced. Regular rate & rhythm. 2/6 SEM RSB. Lungs: clear Abdomen:  soft, nontender, nondistended. No hepatosplenomegaly. No bruits or masses. Good bowel sounds. Extremities: no cyanosis, clubbing, rash, edema Neuro: alert & oriented x 3, cranial nerves grossly intact. moves all 4 extremities w/o difficulty. Affect pleasant.  ECG: AF with LBBB v-rate 111  Results for orders placed or performed during the hospital encounter of 06/10/14 (from the past 24 hour(s))  Troponin I     Status: Abnormal   Collection Time: 06/10/14  7:52 PM  Result Value Ref Range   Troponin I 0.42 (H) <0.031 ng/mL  Magnesium     Status: None   Collection Time: 06/10/14  7:52 PM  Result Value Ref Range   Magnesium 2.2 1.5 - 2.5 mg/dL  Brain natriuretic peptide (IF shortness of breath has been documented this visit)     Status: Abnormal   Collection Time: 06/10/14  7:52 PM  Result Value Ref Range   B Natriuretic Peptide 143.5 (H) 0.0 - 100.0 pg/mL  TSH     Status: None   Collection Time: 06/10/14  7:52 PM  Result Value Ref Range   TSH 2.320 0.350 - 4.500 uIU/mL  CBC     Status: Abnormal   Collection Time: 06/10/14  7:52 PM  Result Value Ref Range   WBC 11.6 (H) 4.0 - 10.5 K/uL   RBC 4.52 3.87 - 5.11 MIL/uL   Hemoglobin 13.4 12.0 - 15.0 g/dL   HCT 41.0 36.0 - 46.0 %   MCV 90.7 78.0 - 100.0 fL   MCH 29.6 26.0 - 34.0 pg   MCHC 32.7 30.0 - 36.0 g/dL   RDW 14.2 11.5 - 15.5 %   Platelets 215 150 - 400 K/uL  Basic metabolic panel     Status: Abnormal   Collection Time: 06/10/14  7:52 PM  Result Value Ref Range   Sodium 138 135 - 145 mmol/L   Potassium 3.9 3.5 - 5.1 mmol/L   Chloride 108 96 - 112 mEq/L   CO2 21 19 - 32 mmol/L   Glucose, Bld 129 (H) 70 - 99 mg/dL   BUN 7 6 - 23 mg/dL   Creatinine, Ser 1.09 0.50 - 1.10 mg/dL   Calcium 9.1 8.4 - 10.5 mg/dL   GFR calc non Af Amer 44 (L) >90 mL/min   GFR calc Af Amer 51 (L) >90 mL/min   Anion gap 9 5 - 15  MRSA PCR Screening     Status: None   Collection Time: 06/10/14 11:24 PM  Result Value Ref Range   MRSA by PCR  NEGATIVE NEGATIVE  Troponin I (q 6hr x 3)     Status: Abnormal   Collection Time: 06/11/14 12:36 AM  Result Value Ref Range   Troponin I 8.51 (HH) <0.031 ng/mL   Dg Chest Port 1 View  06/10/2014   CLINICAL DATA:  Shortness of breath and central chest pain for 1 day.  EXAM: PORTABLE CHEST - 1 VIEW  COMPARISON:  07/08/2012.  FINDINGS: Interval mildly enlarged cardiac silhouette with increased prominence of the pulmonary vasculature and interstitial markings. Small bilateral pleural effusions. Stable median sternotomy wires and left subclavian pacemaker leads. Mild right shoulder degenerative changes.  IMPRESSION: Interval mild cardiomegaly and mild changes of congestive heart failure.   Electronically Signed   By: Enrique Sack M.D.   On: 06/10/2014 20:23     ASSESSMENT: 1. NSTEMI 2. Chronic AF, now RVR 3. H/o GI bleed off warfarin 4. HOCM 5. H/o MR s/p bioprosthetic MVR 6. DNR  PLAN/DISCUSSION:  I suspect primary event was NSTEMI and accelerated the rate of her chronic AF. She is now pain free. I discussed cath with her at length and she is willing to consider, if Dr. Radford Pax approves. Given no recent GI bleeding, will start heparin for 48 hours. If does decide to go to cath would need to consider BMS. Will treat with ASA, blocker and statin. Watch H&H closely. We will follow.   Achillies Buehl,MD 5:34 AM

## 2014-06-11 NOTE — CV Procedure (Signed)
Jasmine [name] is a 81 y.o. female    [id number]  [id number] LOCATION:  FACILITY: [facility]  PHYSICIAN: Troy Sine, MD, [facility] 1924-12-26   DATE OF PROCEDURE:  06/11/2014    CARDIAC CATHETERIZATION     HISTORY:    [name] is a 82 y.o. female who has a history of hypertension, permanent atrial fibrillation, not on warfarin due to recurrent GI bleeds, left bundle branch block,HOCM, bradycardia, status post permanent pacemaker insertion, history of prior severe MR status post pericardial tissue mitral valve replacement.  She is status post myomectomy.  She has developed atrial fibrillation with rapid ventricular response.  Initial troponin was 0.42 which rose to 8.51, suggestive of non-ST segment elevation myocardial infarction.  Due to recurrent chest pain she is now taken emergently to the cardiac catheterization laboratory.   PROCEDURE: Left heart catheterization: coronary angiography,  Biplane left ventriculography   The patient was brought to the [facility] cardiac catherization laboratory in the fasting state. She was premedicated with fentanyl 25 g. Her right groin was prepped and shaved in usual sterile fashion. Xylocaine 1% was used for local anesthesia. A 6 French sheath was inserted into the R femoral artery. Diagnostic catheterizatiion was done with 5 [place] FL4, FR4, and pigtail catheters. Left ventriculography was done in the RAO and LAO projections to further evaluate the septal wall. Hemostasis was obtained by direct manual compression. The patient tolerated the procedure well.   HEMODYNAMICS:   Central Aorta: 145/62   Left Ventricle: 145/9   ANGIOGRAPHY:  Left main: Angiographically normal and bifurcated into the LAD and left circumflex coronary arteries  LAD: moderate size vessel that gave rise to a large first diagonal branch.  The LAD and its branches were normal.  The LAD extended to an abrupt around the LV apex.  Left circumflex: Angiographically  normal vessel gave rise to one major marginal branch and ended in the posterolateral like vessel  Right coronary artery: large dominant vessel which had minimal luminal irregularity proximally with smooth 10-20% narrowing in the mid RCA after the anterior RV marginal branch.   Left ventriculography found in the RAO and LAO projections revealed mild LV dysfunction with anterolateral hypokinesis with focal apical akinesis as well as severe hypo-to akinesis of the mid distal septal wall extending to the apex.   .  Ejection fraction is approximate 45%.   IMPRESSION:  Mild LV dysfunction with significant mid distal anterolateral hypocontractility, focal apical akinesis, and mid distal septal wall, severe hypo-to akinesis.  No significant coronary obstructive disease with minimal luminal irregularities of the proximal RCA with 10-20% in RCA narrowing and otherwise appearing normal LAD and circumflex vessels.  Troy Sine, MD, [facility] 06/11/2014 2:59 PM

## 2014-06-11 NOTE — Progress Notes (Signed)
Rt fem art sheath removed. Manual pressure held 77min. Dressing with tegaderm applied and pt instructed. Rt dp is palpable. Pt leaves ha in stable condition. Rt groin is unremarkable and dressing is CDI.

## 2014-06-11 NOTE — Progress Notes (Signed)
CRITICAL VALUE ALERT  Critical value received:  Trop 8.51  Date of notification:  06/10/14  Time of notification:  0036  Critical value read back:Yes.    Nurse who received alert:  Teresita Madura RN  MD notified (1st page):  Chaney Malling NP  Time of first page:  0036  MD notified (2nd page):  Time of second page:  Responding MD:  Chaney Malling NP  Time MD responded:  [phone]

## 2014-06-11 NOTE — Progress Notes (Signed)
Verified with pt about the code status and claimed that she wants to be resuscitated, discussed this with the presence of the family member.

## 2014-06-11 NOTE — Progress Notes (Signed)
ANTICOAGULATION CONSULT NOTE - Follow Up Consult  Pharmacy Consult for Heparin Indication: atrial fibrillation  Allergies  Allergen Reactions  . Cymbalta [Duloxetine Hcl]     Diarrhea, weakness    Patient Measurements: Height: 5\' 8"  (172.7 cm) Weight: 171 lb 11.8 oz (77.9 kg) IBW/kg (Calculated) : 63.9 Heparin Dosing Weight:   Vital Signs: Temp: 98.7 F (37.1 C) (01/05 1630) Temp Source: Oral (01/05 1630) BP: 130/64 mmHg (01/05 1815) Pulse Rate: 80 (01/05 1815)  Labs:  Recent Labs  06/10/14 1952 06/11/14 0036 06/11/14 0536 06/11/14 1745  HGB 13.4  --  12.5  --   HCT 41.0  --  38.2  --   PLT 215  --  226  --   LABPROT  --   --   --  14.0  INR  --   --   --  1.07  CREATININE 1.09  --  0.90  --   TROPONINI 0.42* 8.51* 11.65*  --     Estimated Creatinine Clearance: 48.3 mL/min (by C-G formula based on Cr of 0.9).   Medications:  Scheduled:  . sodium chloride   Intravenous STAT  . aspirin  81 mg Oral Daily  . dorzolamide  1 drop Both Eyes BID  . ferrous sulfate  325 mg Oral Q breakfast  . fluticasone  1 spray Each Nare Daily  . fluticasone  2 puff Inhalation BID  . furosemide  40 mg Intravenous Daily  . gi cocktail  30 mL Oral Once  . lisinopril  20 mg Oral Daily  . loratadine  10 mg Oral Daily  . LORazepam  1 mg Oral QHS  . memantine  10 mg Oral BID  . metoprolol tartrate  25 mg Oral BID  . nitroGLYCERIN  0.4 mg Transdermal Daily  . pantoprazole  40 mg Oral Daily  . pantoprazole  40 mg Oral Daily  . pravastatin  10 mg Oral q1800  . sodium chloride  3 mL Intravenous Q12H  . vitamin E  400 Units Oral Daily    Assessment: 81 yr old female to be restarted on heparin for a.fib following cath. Will be conservative due to history of GI bleed on coumadin. Heparin will be restarted at same rate 8 hrs after sheath pull as requested by MD. Goal of Therapy:  Heparin level 0.3-0.7 units/ml Monitor platelets by anticoagulation protocol: Yes   Plan:  Will  restart the heparin at midnight (sheath pull at 4PM) at previous rate with no bolus (900 units/hr). Will check heparin level and CBC with AM labs.  Minta Balsam 06/11/2014,6:27 PM

## 2014-06-11 NOTE — Progress Notes (Signed)
[place] TEAM 1 - Stepdown/ICU TEAM Progress Note  Jasmine [name] EVO:[id number] DOB: 09/16/1924 DOA: 06/10/2014 PCP: [name], MD  Admit HPI / Brief Narrative: Jasmine [name] is a 81 y.o. BF PMHx idiopathic hypertropic subaortic aortic stenosis (IHSS) S/P septal myomectomy,HTN, chronic atrial fibrillation (not on warfarin due to recurrent GI bleeds), LBBB, HOCM, bradycardia s/p PPM, severe MR s/p pericardial tissue MVR, mild AI and diastolic dysfunction.  Presented with chest pain radiating to the back and left arm of one day duration the patient lives with her daughter and has a history of dementia. She reports associated nausea with dry heaves and cold sweats. No relieving or exacerbating factors. She went to her primary care physician's office and she was found to have atrial fibrillation with rapid ventricular rate so she was advised to come to the emergency room and was started on IV Cardizem drip patient denies chest pain at the moment. Cardiology saw the patient in the emergency room and will follow after admission. The patient in the emergency room insisted on being DO NOT RESUSCITATE  HPI/Subjective: 1/5 troponin continues to trend up with third troponin= 11.65, patient states negative CP, negative SOB currently  Assessment/Plan: Chronic Atrial fibrillation with rapid ventricular rate - not a candidate for anticoagulation due to recurrent GI bleed.  -Currently rate controlled on Cardizem drip and metoprolol 25 mg BID  NSTEMI - currently patient pain-free/negative SOB  - continue with heparin drip,  - Dr. Glori Bickers (cardiology/heart failure) is considering cardiac catheterization   Elevated troponin  -Most likely secondary to NSTEMI -See NSTEMI -Continues to trend troponin q 6 hr until troponin begins to trend down. -Obtain A1c -Obtain lipid panel  Chronic systolic and diastolic CHF -Continue aspirin 81 mg daily -See chronic atrial fibrillation -Lasix 40 mg  daily -Lisinopril 20 mg daily -Strict in and out -Daily a.m. Weight -PPM present  idiopathic hypertropic subaortic aortic stenosis (IHSS)  -Defer to heart failure team; appears cardiac catheterization imminent  Mitral valve regurgitation S/P valve replacement   Code Status: DO NOT RESUSCITATE Family Communication: no family present at time of exam Disposition Plan: NSTEMI resolution   Consultants:  Dr. Glori Bickers (cardiology/heart failure)    Procedure/Significant Events: 08/30/2012 echocardiogram; mild concentric LVH-severe asymmetric septal hypertrophy-LVEF= 45-50% -Prosthetic valve functioning normally-grade 2 diastolic dysfunction   Culture NA    Antibiotics: NA    DVT prophylaxis: Heparin drip  Devices Pacemaker present left chest wall    LINES / TUBES:      Continuous Infusions: . diltiazem (CARDIZEM) infusion 15 mg/hr (06/10/14 2245)  . heparin 900 Units/hr (06/11/14 0327)    Objective: VITAL SIGNS: Temp: 98.1 F (36.7 C) (01/05 0800) Temp Source: Oral (01/05 0800) BP: 126/57 mmHg (01/05 0900) Pulse Rate: 70 (01/05 0900) SPO2; FIO2:   Intake/Output Summary (Last 24 hours) at 06/11/14 0918 Last data filed at 06/11/14 0900  Gross per 24 hour  Intake    102 ml  Output    450 ml  Net   -348 ml     Exam: General:  A/O 4, NAD, No acute respiratory distress Lungs: Clear to auscultation bilaterally without wheezes or crackles Cardiovascular: Regular rate and rhythm without murmur gallop or rub normal S1 and S2 Abdomen: Nontender, nondistended, soft, bowel sounds positive, no rebound, no ascites, no appreciable mass Extremities: No significant cyanosis, clubbing, or edema bilateral lower extremities  Data Reviewed: Basic Metabolic Panel:  Recent Labs Lab 06/10/14 1952 06/11/14 0536  NA 138 139  K 3.9  4.1  CL 108 107  CO2 21 22  GLUCOSE 129* 121*  BUN 7 6  CREATININE 1.09 0.90  CALCIUM 9.1 8.9  MG 2.2  --    Liver  Function Tests: No results for input(s): AST, ALT, ALKPHOS, BILITOT, PROT, ALBUMIN in the last 168 hours. No results for input(s): LIPASE, AMYLASE in the last 168 hours. No results for input(s): AMMONIA in the last 168 hours. CBC:  Recent Labs Lab 06/10/14 1952 06/11/14 0536  WBC 11.6* 10.3  HGB 13.4 12.5  HCT 41.0 38.2  MCV 90.7 90.1  PLT 215 226   Cardiac Enzymes:  Recent Labs Lab 06/10/14 1952 06/11/14 0036 06/11/14 0536  TROPONINI 0.42* 8.51* 11.65*   BNP (last 3 results) No results for input(s): PROBNP in the last 8760 hours. CBG: No results for input(s): GLUCAP in the last 168 hours.  Recent Results (from the past 240 hour(s))  MRSA PCR Screening     Status: None   Collection Time: 06/10/14 11:24 PM  Result Value Ref Range Status   MRSA by PCR NEGATIVE NEGATIVE Final    Comment:        The GeneXpert MRSA Assay (FDA approved for NASAL specimens only), is one component of a comprehensive MRSA colonization surveillance program. It is not intended to diagnose MRSA infection nor to guide or monitor treatment for MRSA infections.      Studies:  Recent x-ray studies have been reviewed in detail by the Attending Physician  Scheduled Meds:  Scheduled Meds: . sodium chloride   Intravenous STAT  . aspirin  81 mg Oral Daily  . dorzolamide  1 drop Both Eyes BID  . ferrous sulfate  325 mg Oral Q breakfast  . fluticasone  1 spray Each Nare Daily  . fluticasone  2 puff Inhalation BID  . furosemide  40 mg Intravenous Daily  . gi cocktail  30 mL Oral Once  . lisinopril  20 mg Oral Daily  . loratadine  10 mg Oral Daily  . LORazepam  1 mg Oral QHS  . memantine  10 mg Oral BID  . metoprolol tartrate  25 mg Oral BID  . pantoprazole  40 mg Oral Daily  . pantoprazole  40 mg Oral Daily  . pravastatin  10 mg Oral q1800  . sodium chloride  3 mL Intravenous Q12H  . vitamin E  400 Units Oral Daily    Time spent on care of this patient: 40 mins   Allie Bossier  , MD   [facility] Office  [phone] Pager - [phone]  On-Call/Text Page:      [email or web address]      password [email or web address]  If 7PM-7AM, please contact night-coverage [email or web address] Password [email or web address] 06/11/2014, 9:18 AM   LOS: 1 day

## 2014-06-11 NOTE — Progress Notes (Signed)
Follow-up:  Notified by RN that 2nd troponin has returned elevated at 8.51 from 0.42 in ED at 2000. Pt currently denies CP and is resting comfortably. She remains in A-Fib but rate in 70's. Admitting MD note indicates cardiologist saw pt in ED but there is no note and no indication as to who saw pt. Discussed pt w/ Dr Haroldine Laws who has recommended pt receive 4 baby ASA now, then ASA 81 mg QD. He also recommends starting Heparin per Rx for NSTEMI. Dr Haroldine Laws has agreed to see pt tonight. Appreciate cardiology input. Will continue to cycle CE's and monitor closely in SDU.  Jeryl Columbia, NP-C [facility] Pager [phone]

## 2014-06-11 NOTE — Progress Notes (Signed)
Refused to eat food in her dinner tray, called  Service response for soup with cracker.

## 2014-06-11 NOTE — Progress Notes (Signed)
CTSP for recurrent CP; chart reviewed; patient admitted and ruled in for NSTEMI; pain initially improved on meds but pain has now returned; discussed risks and benefits of cardiac catheterization and patient agrees to proceed. Will plan urgently due to recurrent angina post infarct on meds. ECG with chronic LBBB. Kirk Ruths

## 2014-06-11 NOTE — Progress Notes (Addendum)
ANTICOAGULATION CONSULT NOTE - Initial Consult  Pharmacy Consult for Heparin  Indication: chest pain/ACS  Allergies  Allergen Reactions  . Cymbalta [Duloxetine Hcl]     Diarrhea, weakness    Patient Measurements: Height: 5\' 8"  (172.7 cm) Weight: 171 lb 11.8 oz (77.9 kg) IBW/kg (Calculated) : 63.9 Heparin Dosing Weight: 78 kg   Vital Signs: Temp: 98.3 F (36.8 C) (01/05 0035) Temp Source: Oral (01/05 0035) BP: 156/69 mmHg (01/05 0035) Pulse Rate: 70 (01/05 0035)  Labs:  Recent Labs  06/10/14 1952 06/11/14 0036  HGB 13.4  --   HCT 41.0  --   PLT 215  --   CREATININE 1.09  --   TROPONINI 0.42* 8.51*    Estimated Creatinine Clearance: 39.9 mL/min (by C-G formula based on Cr of 1.09).   Medical History: Past Medical History  Diagnosis Date  . GERD (gastroesophageal reflux disease)   . Insomnia   . Eczema   . Glaucoma   . Diastolic dysfunction   . IHSS (idiopathic hypertrophic subaortic stenosis)     s/p septal myomectomy  . High cholesterol   . Coronary artery disease   . Blood transfusion   . Chronic kidney disease (CKD), stage III (moderate)   . Severe mitral regurgitation     s/p MVR with pericardial tissue valve  . Diverticulosis of colon with hemorrhage 08/12/2012  . GI bleed   . Asthma   . Hypertension   . Reflux esophagitis   . Pulmonary HTN     mild to moderate  . Aortic insufficiency   . Diastolic dysfunction   . Bradycardia     s/p PPM  . Atrial fibrillation and flutter     atrial fibrillation s/p DCCV 06-2012-  now in chronic atrial fibrillation off anticoagulation due to GI bleed  . Paroxysmal atrial tachycardia   . PVCs (premature ventricular contractions)   . DCM (dilated cardiomyopathy)      50% echo 06/2012  . Heart murmur     Medications:  Prescriptions prior to admission  Medication Sig Dispense Refill Last Dose  . acetaminophen (TYLENOL) 500 MG tablet Take 500 mg by mouth every 6 (six) hours as needed. For pain   Taking  .  albuterol (PROVENTIL HFA;VENTOLIN HFA) 108 (90 BASE) MCG/ACT inhaler Inhale 2 puffs into the lungs every 4 (four) hours as needed. For shortness of breath   Taking  . amLODipine (NORVASC) 10 MG tablet Take 5 mg by mouth daily.    Taking  . budesonide (PULMICORT) 180 MCG/ACT inhaler Inhale 2 puffs into the lungs as needed.   Taking  . Calcium Carbonate-Vitamin D (CALTRATE 600+D) 600-400 MG-UNIT per tablet Take 1 tablet by mouth daily.   Taking  . cetirizine (ZYRTEC) 10 MG tablet Take 10 mg by mouth at bedtime as needed for allergies. For allergy symptoms   Taking  . dexlansoprazole (DEXILANT) 60 MG capsule Take 60 mg by mouth every morning.    Taking  . dorzolamide (TRUSOPT) 2 % ophthalmic solution Place 1 drop into both eyes 2 (two) times daily.   Taking  . dorzolamide-timolol (COSOPT) 22.3-6.8 MG/ML ophthalmic solution    Taking  . Ferrous Sulfate (IRON) 28 MG TABS Take 1 tablet by mouth daily.   Taking  . fluticasone (FLONASE) 50 MCG/ACT nasal spray Place into both nostrils as needed for allergies or rhinitis.   Taking  . FLUZONE HIGH-DOSE injection    Taking  . guaiFENesin (MUCINEX) 600 MG 12 hr tablet Take 600 mg by  mouth daily as needed. For congestion   Taking  . lisinopril (PRINIVIL,ZESTRIL) 20 MG tablet Take 20 mg by mouth daily.   Taking  . loperamide (IMODIUM) 1 MG/5ML solution Take by mouth as needed for diarrhea or loose stools.   Taking  . LORazepam (ATIVAN) 1 MG tablet Take 1 mg by mouth at bedtime.    Taking  . lovastatin (MEVACOR) 20 MG tablet Take 20 mg by mouth at bedtime.   Taking  . memantine (NAMENDA) 10 MG tablet Take 10 mg by mouth 2 (two) times daily.   Taking  . metoprolol succinate (TOPROL-XL) 25 MG 24 hr tablet TAKE ONE TABLET BY MOUTH ONCE DAILY 30 tablet 9   . Multiple Vitamins-Minerals (ALIVE WOMENS 50+ PO) Take by mouth daily.   Taking  . omeprazole (PRILOSEC) 20 MG capsule Take 20 mg by mouth daily.   Taking  . pantoprazole (PROTONIX) 40 MG tablet Take 40 mg by  mouth daily.   Taking  . Simethicone (GAS-X EXTRA STRENGTH) 125 MG CAPS Take 125 mg by mouth as needed.   Taking  . vitamin E 400 UNIT capsule Take 400 Units by mouth daily.   Taking    Assessment: 53 YOF admitted with chest pain. 2nd troponin has returned at 8.51 from 0.42. Pharmacy consulted to start heparin infusion for NSTEMI. She remains in Afib but rate is controlled. H/H and plt wnl. Patient has a history of bleeding and states her cardiologist instructed her to not taken any anticoagulation. Patient is now refusing heparin.   Goal of Therapy:  Heparin level 0.3-0.7 units/ml Monitor platelets by anticoagulation protocol: Yes   Plan:  -Start heparin infusion at 900 units/hr if okay with cardiology. NO BOLUS.  -Monitor daily HL, CBC and s/s of bleeding   Albertina Parr, PharmD., BCPS Clinical Pharmacist Pager [phone]   Addendum: Per RN, patient spoke with Dr. Haroldine Laws and has agreed to try heparin x 24 hours. Will send up heparin bag to start drip at 900 units/hr. F/u 8 hour HL and monitor closely for bleeding.   Albertina Parr, PharmD., BCPS Clinical Pharmacist Pager [phone]

## 2014-06-12 ENCOUNTER — Encounter (HOSPITAL_COMMUNITY): Payer: Self-pay | Admitting: Cardiovascular Disease

## 2014-06-12 ENCOUNTER — Encounter: Payer: Self-pay | Admitting: Cardiology

## 2014-06-12 DIAGNOSIS — R0789 Other chest pain: Secondary | ICD-10-CM

## 2014-06-12 DIAGNOSIS — I2510 Atherosclerotic heart disease of native coronary artery without angina pectoris: Secondary | ICD-10-CM

## 2014-06-12 LAB — CBC
HEMATOCRIT: 33.3 % — AB (ref 36.0–46.0)
HEMOGLOBIN: 10.7 g/dL — AB (ref 12.0–15.0)
MCH: 29 pg (ref 26.0–34.0)
MCHC: 32.1 g/dL (ref 30.0–36.0)
MCV: 90.2 fL (ref 78.0–100.0)
PLATELETS: 222 10*3/uL (ref 150–400)
RBC: 3.69 MIL/uL — ABNORMAL LOW (ref 3.87–5.11)
RDW: 14.2 % (ref 11.5–15.5)
WBC: 13.4 10*3/uL — ABNORMAL HIGH (ref 4.0–10.5)

## 2014-06-12 LAB — HEPARIN LEVEL (UNFRACTIONATED): HEPARIN UNFRACTIONATED: 0.28 [IU]/mL — AB (ref 0.30–0.70)

## 2014-06-12 LAB — BASIC METABOLIC PANEL
Anion gap: 9 (ref 5–15)
BUN: 11 mg/dL (ref 6–23)
CO2: 25 mmol/L (ref 19–32)
Calcium: 8.8 mg/dL (ref 8.4–10.5)
Chloride: 103 mEq/L (ref 96–112)
Creatinine, Ser: 1.02 mg/dL (ref 0.50–1.10)
GFR calc non Af Amer: 48 mL/min — ABNORMAL LOW (ref >90)
GFR, EST AFRICAN AMERICAN: 56 mL/min — AB (ref >90)
GLUCOSE: 115 mg/dL — AB (ref 70–99)
POTASSIUM: 3.8 mmol/L (ref 3.5–5.1)
Sodium: 137 mmol/L (ref 135–145)

## 2014-06-12 LAB — URINALYSIS, ROUTINE W REFLEX MICROSCOPIC
Bilirubin Urine: NEGATIVE
Glucose, UA: NEGATIVE mg/dL
Hgb urine dipstick: NEGATIVE
Ketones, ur: NEGATIVE mg/dL
LEUKOCYTES UA: NEGATIVE
NITRITE: NEGATIVE
PROTEIN: NEGATIVE mg/dL
SPECIFIC GRAVITY, URINE: 1.024 (ref 1.005–1.030)
Urobilinogen, UA: 0.2 mg/dL (ref 0.0–1.0)
pH: 5.5 (ref 5.0–8.0)

## 2014-06-12 LAB — TROPONIN I
TROPONIN I: 10.02 ng/mL — AB (ref <0.031)
Troponin I: 9.67 ng/mL (ref <0.031)

## 2014-06-12 LAB — LIPID PANEL
CHOL/HDL RATIO: 3.1 ratio
Cholesterol: 138 mg/dL (ref 0–200)
HDL: 45 mg/dL (ref >39)
LDL CALC: 78 mg/dL (ref 0–99)
Triglycerides: 75 mg/dL (ref <150)
VLDL: 15 mg/dL (ref 0–40)

## 2014-06-12 LAB — HEMOGLOBIN A1C
Hgb A1c MFr Bld: 5.8 % — ABNORMAL HIGH (ref <5.7)
MEAN PLASMA GLUCOSE: 120 mg/dL — AB (ref <117)

## 2014-06-12 LAB — MAGNESIUM: Magnesium: 2.1 mg/dL (ref 1.5–2.5)

## 2014-06-12 MED ORDER — CALCIUM CARBONATE-VITAMIN D 500-200 MG-UNIT PO TABS
1.0000 | ORAL_TABLET | Freq: Every day | ORAL | Status: DC
  Administered 2014-06-12 – 2014-06-14 (×3): 1 via ORAL
  Filled 2014-06-12 (×4): qty 1

## 2014-06-12 MED ORDER — CALCIUM CARBONATE-VITAMIN D 600-400 MG-UNIT PO TABS: 1.0000 | ORAL_TABLET | Freq: Every day | ORAL | Status: DC

## 2014-06-12 MED ORDER — LISINOPRIL 10 MG PO TABS
10.0000 mg | ORAL_TABLET | Freq: Every day | ORAL | Status: DC
  Administered 2014-06-13 – 2014-06-14 (×2): 10 mg via ORAL
  Filled 2014-06-12 (×2): qty 1

## 2014-06-12 MED ORDER — DILTIAZEM HCL ER COATED BEADS 120 MG PO CP24
120.0000 mg | ORAL_CAPSULE | ORAL | Status: DC
  Administered 2014-06-12 – 2014-06-14 (×3): 120 mg via ORAL
  Filled 2014-06-12 (×3): qty 1

## 2014-06-12 MED ORDER — DILTIAZEM HCL ER COATED BEADS 120 MG PO CP24
120.0000 mg | ORAL_CAPSULE | Freq: Every day | ORAL | Status: DC
  Filled 2014-06-12: qty 1

## 2014-06-12 MED ORDER — ALUM & MAG HYDROXIDE-SIMETH 200-200-20 MG/5ML PO SUSP
30.0000 mL | ORAL | Status: DC | PRN
  Administered 2014-06-12: 30 mL via ORAL
  Filled 2014-06-12: qty 30

## 2014-06-12 NOTE — Progress Notes (Signed)
ANTICOAGULATION CONSULT NOTE - [facility] for Heparin  Indication: chest pain/ACS  Allergies  Allergen Reactions  . Cymbalta [Duloxetine Hcl]     Diarrhea, weakness    Patient Measurements: Height: 5\' 8"  (172.7 cm) Weight: 171 lb 11.8 oz (77.9 kg) IBW/kg (Calculated) : 63.9 Heparin Dosing Weight: 78 kg   Vital Signs: Temp: 98.5 F (36.9 C) (01/06 0800) Temp Source: Oral (01/06 0800) BP: 103/53 mmHg (01/06 0800) Pulse Rate: 69 (01/06 0800)  Labs:  Recent Labs  06/10/14 1952  06/11/14 0536 06/11/14 1745 06/11/14 2343 06/12/14 0520  HGB 13.4  --  12.5  --   --  10.7*  HCT 41.0  --  38.2  --   --  33.3*  PLT 215  --  226  --   --  222  LABPROT  --   --   --  14.0  --   --   INR  --   --   --  1.07  --   --   HEPARINUNFRC  --   --   --   --   --  0.28*  CREATININE 1.09  --  0.90  --   --  1.02  TROPONINI 0.42*  < > 11.65* 10.80* 10.02* 9.67*  < > = values in this interval not displayed.  Estimated Creatinine Clearance: 42.6 mL/min (by C-G formula based on Cr of 1.02).   Medical History: Past Medical History  Diagnosis Date  . GERD (gastroesophageal reflux disease)   . Insomnia   . Eczema   . Glaucoma   . Diastolic dysfunction   . IHSS (idiopathic hypertrophic subaortic stenosis)     s/p septal myomectomy  . High cholesterol   . Coronary artery disease   . Blood transfusion   . Chronic kidney disease (CKD), stage III (moderate)   . Severe mitral regurgitation     s/p MVR with pericardial tissue valve  . Diverticulosis of colon with hemorrhage 08/12/2012  . GI bleed   . Asthma   . Hypertension   . Reflux esophagitis   . Pulmonary HTN     mild to moderate  . Aortic insufficiency   . Diastolic dysfunction   . Bradycardia     s/p PPM  . Atrial fibrillation and flutter     atrial fibrillation s/p DCCV 06-2012-  now in chronic atrial fibrillation off anticoagulation due to GI bleed  . Paroxysmal atrial tachycardia   . PVCs  (premature ventricular contractions)   . DCM (dilated cardiomyopathy)      50% echo 06/2012  . Heart murmur     Assessment: 60 YOF admitted with chest pain. 2nd troponin has returned at 8.51 from 0.42. Pharmacy consulted to start heparin infusion for NSTEMI. She remains in Afib but rate is controlled. H/H and plt wnl. Patient has a history of bleeding and states her cardiologist instructed her to not taken any anticoagulation.  Heparin level this morning was below goal at 0.28. However, patient also developed a groin hematoma this AM ~ 6 AM.  Discussed with Dr. Ellyn Hack, and decided to d/c IV heparin.  Goal of Therapy:  Heparin level 0.3-0.7 units/ml Monitor platelets by anticoagulation protocol: Yes   Plan:  D/C IV heparin.  Uvaldo Rising, BCPS  Clinical Pharmacist Pager [phone]  06/12/2014 11:04 AM

## 2014-06-12 NOTE — Progress Notes (Signed)
81 y.o. F w/ a Hx idiopathic hypertropic subaortic aortic stenosis (IHSS) S/P septal myomectomy, HTN, chronic atrial fibrillation (not on warfarin due to recurrent GI bleeds), LBBB, bradycardia s/p PPM, severe MR s/p pericardial tissue MVR, mild AI and diastolic dysfunction who resented with chest pain radiating to the back and left arm of one days duration. She went to her primary care physician's office and was found to have atrial fibrillation with rapid ventricular rate so she was advised to come to the emergency room, where she was started on IV Cardizem. The patient insisted on being DO NOT RESUSCITATE  He was taken urgently to the cardiac catheterization lab yesterday when her troponin levels became elevated with persistent chest pain. Card catheterization failed to reveal any significant disease.  Subjective:  Feels well this morning. She did have evidence of a hematoma following her catheterization. I have stopped heparin and that seems to be stabilized now. The area is soft nontender with receding area of ecchymosis.  Objective:  Vital Signs in the last 24 hours: Temp:  [98.2 F (36.8 C)-98.7 F (37.1 C)] 98.7 F (37.1 C) (01/06 1200) Pulse Rate:  [34-109] 69 (01/06 1200) Resp:  [8-31] 14 (01/06 1200) BP: (89-141)/(35-110) 104/41 mmHg (01/06 1200) SpO2:  [91 %-100 %] 95 % (01/06 1200)  Intake/Output from previous day: 01/05 0701 - 01/06 0700 In: 1848.4 [P.O.:360; I.V.:1488.4] Out: 1900 [Urine:1900] Intake/Output from this shift: Total I/O In: 238.2 [I.V.:238.2] Out: -   Physical Exam: General: Well appearing. No respiratory difficulty HEENT: normal Neck: supple. no JVD. Carotids 2+ bilat; no bruits. No lymphadenopathy or thryomegaly appreciated. Cor: PMI nondisplaced. Regular rate & rhythm. 2/6 SEM RSB. Lungs: clear Abdomen: soft, nontender, nondistended. No hepatosplenomegaly. No bruits or masses. Good bowel sounds. Extremities: no cyanosis, clubbing, rash,  edema Neuro: alert & oriented x 3, cranial nerves grossly intact. moves all 4 extremities w/o difficulty. Affect pleasant.  Lab Results:  Recent Labs  06/11/14 0536 06/12/14 0520  WBC 10.3 13.4*  HGB 12.5 10.7*  PLT 226 222    Recent Labs  06/11/14 0536 06/12/14 0520  NA 139 137  K 4.1 3.8  CL 107 103  CO2 22 25  GLUCOSE 121* 115*  BUN 6 11  CREATININE 0.90 1.02    Recent Labs  06/11/14 2343 06/12/14 0520  TROPONINI 10.02* 9.67*   Hepatic Function Panel No results for input(s): PROT, ALBUMIN, AST, ALT, ALKPHOS, BILITOT, BILIDIR, IBILI in the last 72 hours.  Recent Labs  06/12/14 0520  CHOL 138   No results for input(s): PROTIME in the last 72 hours.  Imaging: Imaging results have been reviewed  Cardiac Studies:  Cardiac catheterization: Mild LV dysfunction with mid-distal anterolateral hypokinetic segment. Minimal CAD with maybe 10-20% proximal RCA lesions. Otherwise nonobstructive CAD.  Telemetry reveals A. fib with controlled rate and intermittent paced beats, underlying LBBB  Assessment/Plan:  Active Problems:   Demand ischemia of myocardium - converted from diagnosis of non-STEMI   Coronary artery disease, non-occlusive by cardiac cath   Atrial fibrillation with RVR   Elevated troponin   Chronic combined systolic and diastolic CHF (congestive heart failure)   IHSS (idiopathic hypertrophic subaortic stenosis)   Chest pain  Positive troponin levels with normal coronary arteries by catheterization was suggested she had subendocardial ischemia in the setting of A. fib RVR with hypertrophic cardiomyopathy.  Non-STEMI would likely be better clarified as a demand ischemia event.  I would discontinue IV heparin.  Her A. fib rate is  now relatively well-controlled. Would convert from IV to oral diltiazem. Her blood pressures is somewhat reduced with being on diltiazem. Will reduce ACE inhibitor dose to 10 mg daily in addition to the oral diltiazem has been  ordered as well as beta blocker.  With underlying A. fib later, she has backup pacing if additional rate control is required  Would anticipate possibly converting from 2 AV nodal agents to a higher dose of beta blocker, however with her current rate control I'm inclined to continue current therapy.  Her A. fib is well-controlled. She refused anticoagulation in the past as results of prior GI bleeds.. Continue with aspirin.  With bioprosthetic mitral valve, she does not require full and equal regulation.   On statin for dyslipidemia  I would anticipate as she is not actively having symptoms in her rate is well controlled that she can be transferred to telemetry. Discussed this with [facility]    LOS: 2 days    Jasmine [name] W 06/12/2014, 3:42 PM

## 2014-06-12 NOTE — Progress Notes (Signed)
Rt. Groin noted to have hematoma. Site marked. Pressure held for 10 minutes. Pressure dressing applied. Groin now soft.

## 2014-06-12 NOTE — Progress Notes (Signed)
[facility] TEAM 1 - Stepdown/ICU TEAM Progress Note  Jasmine [name] OEV:[id number] DOB: Nov 17, 1924 DOA: 06/10/2014 PCP: [name], MD  Admit HPI / Brief Narrative: 81 y.o. F w/ a Hx idiopathic hypertropic subaortic aortic stenosis (IHSS) S/P septal myomectomy, HTN, chronic atrial fibrillation (not on warfarin due to recurrent GI bleeds), LBBB, bradycardia s/p PPM, severe MR s/p pericardial tissue MVR, mild AI and diastolic dysfunction who resented with chest pain radiating to the back and left arm of one days duration. She went to her primary care physician's office and was found to have atrial fibrillation with rapid ventricular rate so she was advised to come to the emergency room, where she was started on IV Cardizem. The patient insisted on being DO NOT RESUSCITATE  HPI/Subjective: Resting comfortably in bed.  Denies cp, sob, n/v, or abdom pain.  States she feels "much better today."  Assessment/Plan:  Chronic Atrial fibrillation with acute rapid ventricular rate -not a candidate for long term anticoagulation due to recurrent GI bleed -currently rate controlled on Cardizem drip and metoprolol 25 mg BID - transition off IV cardizem today and watch HR w/ physical exertion   NSTEMI - Elevated troponin  - experienced acute return of chest pain yesterday, therefore was taken for urgent cath which noted no signif focal CAD - troponin slowly trending downward - Cardiology following - LDL 78  Chronic systolic and diastolic CHF -appears to be well compensated at present - weight stable at apparent baseline of ~78kg - net negative ~466ml  Idiopathic hypertropic subaortic aortic stenosis (IHSS)  -as per Cardiology   Mitral valve regurgitation S/P bioprosthetic MVR -no evidence presently of severe valvular dysfunction   Hyperglycemia  A1c 5.8 (not c/w DM)  Code Status: FULL Family Communication: spoke w/ daughter at bedside at length  Disposition Plan: SDU until off cardizem gtt for  24hrs  Consultants: Dr. Glori Bickers (Cardiology)  Procedures: 1/5 - cardiac cath - Mild LV dysfunction with significant mid distal anterolateral hypocontractility, focal apical akinesis, and mid distal septal wall, severe hypo-to akinesis - No significant coronary obstructive disease with minimal luminal irregularities of the proximal RCA with 10-20% in RCA narrowing and otherwise appearing normal LAD and circumflex vessels.  Antibiotics: NA   DVT prophylaxis: SQ heparin  Objective: Blood pressure 103/53, pulse 69, temperature 98.5 F (36.9 C), temperature source Oral, resp. rate 14, height 5\' 8"  (1.727 m), weight 77.9 kg (171 lb 11.8 oz), SpO2 97 %.  Intake/Output Summary (Last 24 hours) at 06/12/14 0950 Last data filed at 06/12/14 0841  Gross per 24 hour  Intake 1890.52 ml  Output   1900 ml  Net  -9.48 ml   Exam: General:  NAD, No acute respiratory distress - alert and conversant  Lungs: Clear to auscultation bilaterally without wheezes or crackles Cardiovascular: rate controlled - no gallup or rub  Abdomen: Nontender, nondistended, soft, bowel sounds positive, no rebound, no ascites, no appreciable mass Extremities: No significant cyanosis, clubbing, or edema bilateral lower extremities  Data Reviewed: Basic Metabolic Panel:  Recent Labs Lab 06/10/14 1952 06/11/14 0536 06/12/14 0520  NA 138 139 137  K 3.9 4.1 3.8  CL 108 107 103  CO2 21 22 25   GLUCOSE 129* 121* 115*  BUN 7 6 11   CREATININE 1.09 0.90 1.02  CALCIUM 9.1 8.9 8.8  MG 2.2  --  2.1   Liver Function Tests: No results for input(s): AST, ALT, ALKPHOS, BILITOT, PROT, ALBUMIN in the last 168 hours.  CBC:  Recent  Labs Lab 06/10/14 1952 06/11/14 0536 06/12/14 0520  WBC 11.6* 10.3 13.4*  HGB 13.4 12.5 10.7*  HCT 41.0 38.2 33.3*  MCV 90.7 90.1 90.2  PLT 215 226 222   Cardiac Enzymes:  Recent Labs Lab 06/11/14 0036 06/11/14 0536 06/11/14 1745 06/11/14 2343 06/12/14 0520  TROPONINI  8.51* 11.65* 10.80* 10.02* 9.67*    Recent Results (from the past 240 hour(s))  MRSA PCR Screening     Status: None   Collection Time: 06/10/14 11:24 PM  Result Value Ref Range Status   MRSA by PCR NEGATIVE NEGATIVE Final    Comment:        The GeneXpert MRSA Assay (FDA approved for NASAL specimens only), is one component of a comprehensive MRSA colonization surveillance program. It is not intended to diagnose MRSA infection nor to guide or monitor treatment for MRSA infections.     Studies:  Recent x-ray studies have been reviewed in detail by the Attending Physician  Scheduled Meds:  Scheduled Meds: . aspirin  81 mg Oral Daily  . dorzolamide  1 drop Both Eyes BID  . ferrous sulfate  325 mg Oral Q breakfast  . fluticasone  1 spray Each Nare Daily  . fluticasone  2 puff Inhalation BID  . furosemide  40 mg Intravenous Daily  . gi cocktail  30 mL Oral Once  . lisinopril  20 mg Oral Daily  . loratadine  10 mg Oral Daily  . LORazepam  1 mg Oral QHS  . memantine  10 mg Oral BID  . metoprolol tartrate  25 mg Oral BID  . nitroGLYCERIN  0.4 mg Transdermal Daily  . pantoprazole  40 mg Oral Daily  . pantoprazole  40 mg Oral Daily  . pravastatin  10 mg Oral q1800  . sodium chloride  3 mL Intravenous Q12H  . vitamin E  400 Units Oral Daily    Time spent on care of this patient: 35 mins  Cherene Altes, MD [facility] For Consults/Admissions - Flow Manager - [phone] Office  [phone]  Contact MD directly via text page:      [email or web address]      password [facility]  06/12/2014, 9:50 AM   LOS: 2 days

## 2014-06-13 ENCOUNTER — Encounter: Payer: Self-pay | Admitting: Cardiology

## 2014-06-13 DIAGNOSIS — R739 Hyperglycemia, unspecified: Secondary | ICD-10-CM

## 2014-06-13 DIAGNOSIS — I340 Nonrheumatic mitral (valve) insufficiency: Secondary | ICD-10-CM | POA: Insufficient documentation

## 2014-06-13 DIAGNOSIS — I248 Other forms of acute ischemic heart disease: Secondary | ICD-10-CM

## 2014-06-13 DIAGNOSIS — I5042 Chronic combined systolic (congestive) and diastolic (congestive) heart failure: Secondary | ICD-10-CM | POA: Insufficient documentation

## 2014-06-13 DIAGNOSIS — K219 Gastro-esophageal reflux disease without esophagitis: Secondary | ICD-10-CM

## 2014-06-13 LAB — CBC
HEMATOCRIT: 30.6 % — AB (ref 36.0–46.0)
Hemoglobin: 10 g/dL — ABNORMAL LOW (ref 12.0–15.0)
MCH: 29.4 pg (ref 26.0–34.0)
MCHC: 32.7 g/dL (ref 30.0–36.0)
MCV: 90 fL (ref 78.0–100.0)
Platelets: 201 10*3/uL (ref 150–400)
RBC: 3.4 MIL/uL — ABNORMAL LOW (ref 3.87–5.11)
RDW: 14.1 % (ref 11.5–15.5)
WBC: 12.2 10*3/uL — ABNORMAL HIGH (ref 4.0–10.5)

## 2014-06-13 LAB — COMPREHENSIVE METABOLIC PANEL
ALK PHOS: 44 U/L (ref 39–117)
ALT: 16 U/L (ref 0–35)
AST: 34 U/L (ref 0–37)
Albumin: 2.9 g/dL — ABNORMAL LOW (ref 3.5–5.2)
Anion gap: 6 (ref 5–15)
BUN: 12 mg/dL (ref 6–23)
CALCIUM: 8.7 mg/dL (ref 8.4–10.5)
CO2: 28 mmol/L (ref 19–32)
CREATININE: 1.08 mg/dL (ref 0.50–1.10)
Chloride: 103 mEq/L (ref 96–112)
GFR calc Af Amer: 52 mL/min — ABNORMAL LOW (ref >90)
GFR calc non Af Amer: 45 mL/min — ABNORMAL LOW (ref >90)
GLUCOSE: 98 mg/dL (ref 70–99)
POTASSIUM: 3.6 mmol/L (ref 3.5–5.1)
Sodium: 137 mmol/L (ref 135–145)
Total Bilirubin: 0.8 mg/dL (ref 0.3–1.2)
Total Protein: 5.8 g/dL — ABNORMAL LOW (ref 6.0–8.3)

## 2014-06-13 LAB — TROPONIN I: Troponin I: 7.46 ng/mL (ref <0.031)

## 2014-06-13 MED ORDER — PANTOPRAZOLE SODIUM 40 MG PO TBEC: 40.0000 mg | DELAYED_RELEASE_TABLET | Freq: Every day | ORAL | Status: DC

## 2014-06-13 MED ORDER — FERROUS SULFATE 325 (65 FE) MG PO TABS
325.0000 mg | ORAL_TABLET | Freq: Every day | ORAL | Status: DC
  Administered 2014-06-14: 325 mg via ORAL
  Filled 2014-06-13 (×2): qty 1

## 2014-06-13 MED ORDER — METOPROLOL SUCCINATE ER 50 MG PO TB24
50.0000 mg | ORAL_TABLET | Freq: Every day | ORAL | Status: DC
  Administered 2014-06-13: 50 mg via ORAL
  Filled 2014-06-13 (×3): qty 1

## 2014-06-13 NOTE — Progress Notes (Signed)
81 y.o. F [name]/ a Hx idiopathic hypertropic subaortic aortic stenosis (IHSS) S/P septal myomectomy, HTN, chronic atrial fibrillation (not on warfarin due to recurrent GI bleeds), LBBB, bradycardia s/p PPM, severe MR s/p pericardial tissue MVR, mild AI and diastolic dysfunction who resented with chest pain radiating to the back and left arm of one days duration. She went to her primary care physician's office and was found to have atrial fibrillation with rapid ventricular rate so she was advised to come to the emergency room, where she was started on IV Cardizem. The patient insisted on being DO NOT RESUSCITATE  1/5 He was taken urgently to the cardiac catheterization lab yesterday when her troponin levels became elevated with persistent chest pain. Card catheterization failed to reveal any significant disease.  Subjective:  Feels well this morning.  Mild GERD sensation. Ambulated well with PT. Hematoma now no longer present. No complaints.  Objective:  Vital Signs in the last 24 hours: Temp:  [98.3 F (36.8 C)-99.4 F (37.4 C)] 98.7 F (37.1 C) (01/07 1204) Pulse Rate:  [73-111] 81 (01/07 1204) Resp:  [6-23] 21 (01/07 1204) BP: (100-157)/(34-94) 113/57 mmHg (01/07 1204) SpO2:  [91 %-100 %] 97 % (01/07 1204)  Intake/Output from previous day: 01/06 0701 - 01/07 0700 In: 878.2 [P.O.:640; I.V.:238.2] Out: 800 [Urine:800] Intake/Output from this shift: Total I/O In: 240 [P.O.:240] Out: 300 [Urine:300]  Physical Exam: General: Well appearing. No respiratory difficulty HEENT: normal Neck: supple. no JVD. Carotids 2+ bilat; no bruits. No lymphadenopathy or thryomegaly appreciated. Cor: PMI nondisplaced. Mostly irregular rhythm with regular rate. 2/6 SEM RSB. Lungs: CTA B, nonlabored Abdomen: soft, nontender, nondistended. No hepatosplenomegaly. No bruits or masses. Good bowel sounds. Extremities: no cyanosis, clubbing, rash, edema; minimal evidence of residual hematoma. There is  mild ecchymosis. Neuro: alert & oriented x 3, cranial nerves grossly intact. moves all 4 extremities [name]/o difficulty. Affect pleasant.  Lab Results:  Recent Labs  06/12/14 0520 06/13/14 0314  WBC 13.4* 12.2*  HGB 10.7* 10.0*  PLT 222 201    Recent Labs  06/12/14 0520 06/13/14 0314  NA 137 137  K 3.8 3.6  CL 103 103  CO2 25 28  GLUCOSE 115* 98  BUN 11 12  CREATININE 1.02 1.08    Recent Labs  06/12/14 0520 06/13/14 0314  TROPONINI 9.67* 7.46*   Hepatic Function Panel  Recent Labs  06/13/14 0314  PROT 5.8*  ALBUMIN 2.9*  AST 34  ALT 16  ALKPHOS 44  BILITOT 0.8    Recent Labs  06/12/14 0520  CHOL 138   No results for input(s): PROTIME in the last 72 hours.  Imaging: Imaging results have been reviewed  Cardiac Studies:  Cardiac catheterization: Mild LV dysfunction with mid-distal anterolateral hypokinetic segment. Minimal CAD with maybe 10-20% proximal RCA lesions. Otherwise nonobstructive CAD.  Telemetry reveals A. fib with controlled rate and intermittent paced beats, underlying LBBB  Assessment/Plan:  Active Problems:   Demand ischemia of myocardium - converted from diagnosis of non-STEMI   Coronary artery disease, non-occlusive by cardiac cath   Atrial fibrillation with RVR   Elevated troponin   Chronic combined systolic and diastolic CHF (congestive heart failure)   IHSS (idiopathic hypertrophic subaortic stenosis)   Chest pain  Positive troponin levels with normal coronary arteries by catheterization was suggested she had subendocardial ischemia in the setting of A. fib RVR with hypertrophic cardiomyopathy.  Non-STEMI would likely be better clarified as a demand ischemia event.  No longer on heparin.  Rate control for A. fib is recommended.  A. fib with RVR: Now rate controlled off of diltiazem drip  Heart rates are slightly higher today. Will increase home Toprol dose to 50 mg and start tonight. She was already given 25 mg of Lopressor  today. We continue with 120 mg diltiazem.  Consider possibly converting to single AV nodal agent as an outpatient.  Has refused anticoagulation in the past/and has history of GI bleeds - treat with aspirin.  With bioprosthetic mitral valve, she does not require full and equal regulation.   On statin for dyslipidemia  Able to be transferred to telemetry    LOS: 3 days    Jasmine [name] 06/13/2014, 1:10 PM

## 2014-06-13 NOTE — Evaluation (Signed)
Occupational Therapy Evaluation Patient Details Name: Jasmine [name] MRN: [id number] DOB: Apr 14, 1925 Today's Date: 06/13/2014    History of Present Illness Pt adm with afib with RVR. Pt with demand ischemia of myocardium. PMH - Hx idiopathic hypertropic subaortic aortic stenosis (IHSS) S/P septal myomectomy, HTN, chronic atrial fibrillation, LBBB, bradycardia s/p PPM, severe MR s/p pericardial tissue MVR, mild AI and diastolic dysfunction.   Clinical Impression   Pt admitted with above. She demonstrates the below listed deficits and will benefit from continued OT to maximize safety and independence with BADLs.  Pt presents to OT with generalized weakness and mild balance deficit. She requires min guard assist for BADLs.  Anticipate good progress.  Will follow       Follow Up Recommendations  No OT follow up;Supervision - Intermittent    Equipment Recommendations  None recommended by OT    Recommendations for Other Services       Precautions / Restrictions Precautions Precautions: None      Mobility Bed Mobility Overal bed mobility: Modified Independent Bed Mobility: Supine to Sit     Supine to sit: Min assist;HOB elevated     General bed mobility comments: increased time   Transfers Overall transfer level: Needs assistance Equipment used: Rolling walker (2 wheeled) Transfers: Sit to/from [facility] Sit to Stand: Min guard Stand pivot transfers: Min guard            Balance Overall balance assessment: Needs assistance Sitting-balance support: Feet supported Sitting balance-Leahy Scale: Normal     Standing balance support: During functional activity Standing balance-Leahy Scale: Good                              ADL Overall ADL's : Needs assistance/impaired Eating/Feeding: Independent   Grooming: Wash/dry hands;Wash/dry face;Oral care;Brushing hair;Min guard;Standing   Upper Body Bathing: Set up;Sitting   Lower Body  Bathing: Min guard;Sit to/from stand   Upper Body Dressing : Set up;Sitting   Lower Body Dressing: Min guard;Sit to/from stand   Toilet Transfer: Min guard;Ambulation   Toileting- Clothing Manipulation and Hygiene: Min guard;Sit to/from stand     Tub/Shower Transfer Details (indicate cue type and reason): recommended to pt that she use shower seat initially at dischage.  She agrees  Functional mobility during ADLs: Min guard General ADL Comments: Pt very motivated.  Mild balance deficit      Vision                     Perception     Praxis      Pertinent Vitals/Pain Pain Assessment: No/denies pain     Hand Dominance Right   Extremity/Trunk Assessment Upper Extremity Assessment Upper Extremity Assessment: Overall WFL for tasks assessed   Lower Extremity Assessment Lower Extremity Assessment: Defer to PT evaluation   Cervical / Trunk Assessment Cervical / Trunk Assessment: Normal   Communication Communication Communication: No difficulties   Cognition Arousal/Alertness: Awake/alert Behavior During Therapy: WFL for tasks assessed/performed Overall Cognitive Status: Within Functional Limits for tasks assessed                     General Comments       Exercises       Shoulder Instructions      Home Living Family/patient expects to be discharged to:: Private residence Living Arrangements: Children Available Help at Discharge: Family Type of Home: House Home Access: Level entry  Home Layout: One level     Bathroom Shower/Tub: Tub/shower unit;Curtain Shower/tub characteristics: Architectural technologist: Standard     Home Equipment: Cane - single point;Shower seat          Prior Functioning/Environment Level of Independence: Independent        Comments: Pt does not drive, but still assists with cooking.  denies h/o falls     OT Diagnosis: Generalized weakness   OT Problem List: Decreased strength;Decreased activity  tolerance;Impaired balance (sitting and/or standing)   OT Treatment/Interventions: Self-care/ADL training;DME and/or AE instruction;Therapeutic activities;Patient/family education;Balance training    OT Goals(Current goals can be found in the care plan section) Acute Rehab OT Goals Patient Stated Goal: return home OT Goal Formulation: With patient Time For Goal Achievement: 06/27/14 Potential to Achieve Goals: Good ADL Goals Pt Will Perform Grooming: with modified independence;standing Pt Will Perform Upper Body Bathing: with modified independence;sitting Pt Will Perform Lower Body Bathing: with modified independence;sit to/from stand Pt Will Perform Upper Body Dressing: with modified independence;sitting Pt Will Perform Lower Body Dressing: with modified independence;sit to/from stand Pt Will Transfer to Toilet: with modified independence;ambulating;regular height toilet;grab bars Pt Will Perform Toileting - Clothing Manipulation and hygiene: with modified independence;sit to/from stand Pt Will Perform Tub/Shower Transfer: Tub transfer;with supervision;ambulating;shower seat  OT Frequency: Min 2X/week   Barriers to D/C:            Co-evaluation              End of Session Nurse Communication: Mobility status  Activity Tolerance: Patient tolerated treatment well Patient left: in bed;with call bell/phone within reach   Time: 2446-2863 OT Time Calculation (min): 24 min Charges:  OT General Charges $OT Visit: 1 Procedure OT Evaluation $Initial OT Evaluation Tier I: 1 Procedure OT Treatments $Self Care/Home Management : 8-22 mins G-Codes:    Shanterica Biehler M June 21, 2014, 3:33 PM

## 2014-06-13 NOTE — Progress Notes (Signed)
[facility] TEAM 1 - Stepdown/ICU TEAM Progress Note  Jasmine [name] [id number]:[id number] DOB: 03/13/1925 DOA: 06/10/2014 PCP: [name], MD  Admit HPI / Brief Narrative: 81 y.o. F w/ a Hx idiopathic hypertropic subaortic aortic stenosis (IHSS) S/P septal myomectomy, HTN, chronic atrial fibrillation (not on warfarin due to recurrent GI bleeds), LBBB, bradycardia s/p PPM, severe MR s/p pericardial tissue MVR, mild AI and diastolic dysfunction who resented with chest pain radiating to the back and left arm of one days duration. She went to her primary care physician's office and was found to have atrial fibrillation with rapid ventricular rate so she was advised to come to the emergency room, where she was started on IV Cardizem. The patient insisted on being DO NOT RESUSCITATE  HPI/Subjective: Up in chair- had "CP" overnight which she attributes to reflux (worse after eating and when supine)  Assessment/Plan:  Chronic Atrial fibrillation with acute rapid ventricular rate -not a candidate for long term anticoagulation due to recurrent GI bleed -currently rate controlled on PO Cardizem and metoprolol 25 mg BID   Elevated troponin -demand ischemia - experienced acute return of chest pain 1/5, therefore was taken for urgent cath 1/6 which noted no signif focal CAD - troponin slowly trending downward - Cardiology ok with transfer to tele bed - LDL 78  Chronic systolic and diastolic CHF -appears to be well compensated at present - weight stable at apparent baseline of ~78kg   GERD -pt endorses "CP" more c/w reflux/supine position-will change Protonix to HS dosing  Idiopathic hypertropic subaortic stenosis (IHSS)  -as per Cardiology   Mitral valve regurgitation S/P bioprosthetic MVR -no evidence presently of severe valvular dysfunction   Hyperglycemia  A1c 5.8 (not c/w DM)  Code Status: FULL Family Communication: spoke w/ daughter at bedside at length  Disposition Plan: Transfer to  telemetry- mobilize and monitor HR with activity/adjusting meds as indicated  Consultants: Dr. Glori Bickers (Cardiology)  Procedures: 1/5 - cardiac cath - Mild LV dysfunction with significant mid distal anterolateral hypocontractility, focal apical akinesis, and mid distal septal wall, severe hypo-to akinesis - No significant coronary obstructive disease with minimal luminal irregularities of the proximal RCA with 10-20% in RCA narrowing and otherwise appearing normal LAD and circumflex vessels.  Antibiotics: NA   DVT prophylaxis: SQ heparin  Objective: Blood pressure 113/57, pulse 81, temperature 98.7 F (37.1 C), temperature source Oral, resp. rate 21, height 5\' 8"  (1.727 m), weight 171 lb 11.8 oz (77.9 kg), SpO2 97 %.  Intake/Output Summary (Last 24 hours) at 06/13/14 1218 Last data filed at 06/13/14 0900  Gross per 24 hour  Intake    480 ml  Output    900 ml  Net   -420 ml   Exam: General: No acute respiratory distress -up in chair Lungs: Clear to auscultation bilaterally without wheezes or crackles Cardiovascular: rate controlled - no gallup or rub , AF with VRs 80s-90s Abdomen: Nontender, nondistended, soft, bowel sounds positive, no rebound, no ascites, no appreciable mass Extremities: No significant cyanosis, clubbing, or edema bilateral lower extremities  Data Reviewed: Basic Metabolic Panel:  Recent Labs Lab 06/10/14 1952 06/11/14 0536 06/12/14 0520 06/13/14 0314  NA 138 139 137 137  K 3.9 4.1 3.8 3.6  CL 108 107 103 103  CO2 21 22 25 28   GLUCOSE 129* 121* 115* 98  BUN 7 6 11 12   CREATININE 1.09 0.90 1.02 1.08  CALCIUM 9.1 8.9 8.8 8.7  MG 2.2  --  2.1  --  Liver Function Tests:  Recent Labs Lab 06/13/14 0314  AST 34  ALT 16  ALKPHOS 44  BILITOT 0.8  PROT 5.8*  ALBUMIN 2.9*    CBC:  Recent Labs Lab 06/10/14 1952 06/11/14 0536 06/12/14 0520 06/13/14 0314  WBC 11.6* 10.3 13.4* 12.2*  HGB 13.4 12.5 10.7* 10.0*  HCT 41.0 38.2 33.3*  30.6*  MCV 90.7 90.1 90.2 90.0  PLT 215 226 222 201   Cardiac Enzymes:  Recent Labs Lab 06/11/14 0536 06/11/14 1745 06/11/14 2343 06/12/14 0520 06/13/14 0314  TROPONINI 11.65* 10.80* 10.02* 9.67* 7.46*    Recent Results (from the past 240 hour(s))  MRSA PCR Screening     Status: None   Collection Time: 06/10/14 11:24 PM  Result Value Ref Range Status   MRSA by PCR NEGATIVE NEGATIVE Final    Comment:        The GeneXpert MRSA Assay (FDA approved for NASAL specimens only), is one component of a comprehensive MRSA colonization surveillance program. It is not intended to diagnose MRSA infection nor to guide or monitor treatment for MRSA infections.     Studies:  Recent x-ray studies have been reviewed in detail by the Attending Physician  Scheduled Meds:  Scheduled Meds: . aspirin  81 mg Oral Daily  . calcium-vitamin D  1 tablet Oral Q breakfast  . diltiazem  120 mg Oral Q24H  . dorzolamide  1 drop Both Eyes BID  . ferrous sulfate  325 mg Oral Q breakfast  . fluticasone  1 spray Each Nare Daily  . fluticasone  2 puff Inhalation BID  . lisinopril  10 mg Oral Daily  . loratadine  10 mg Oral Daily  . LORazepam  1 mg Oral QHS  . memantine  10 mg Oral BID  . metoprolol succinate  50 mg Oral QHS  . [START ON 06/14/2014] pantoprazole  40 mg Oral QHS  . pravastatin  10 mg Oral q1800  . sodium chloride  3 mL Intravenous Q12H  . vitamin E  400 Units Oral Daily    Time spent on care of this patient: 35 mins  Luana Shu [facility] For Consults/Admissions - Flow Manager - [phone] Office  [phone]  Contact MD directly via text page:      [email or web address]      password [facility]  06/13/2014, 12:18 PM   LOS: 3 days      Examined Patient and discussed A&P with ANP Ebony Hail and agree with above plan. I have reviewed the entire database. I have made any necessary editorial changes, and agree with its content. Pt with Multiple Complex medical problems> 35  min spent in direct Pt care   Jasmine Crawford, MD [facility] [phone] pager

## 2014-06-13 NOTE — Evaluation (Signed)
Physical Therapy Evaluation Patient Details Name: Jasmine [name] MRN: [id number] DOB: August 30, 1924 Today's Date: 06/13/2014   History of Present Illness  Pt adm with afib with RVR. Pt with demand ischemia of myocardium. PMH - Hx idiopathic hypertropic subaortic aortic stenosis (IHSS) S/P septal myomectomy, HTN, chronic atrial fibrillation, LBBB, bradycardia s/p PPM, severe MR s/p pericardial tissue MVR, mild AI and diastolic dysfunction.  Clinical Impression  Pt admitted with above diagnosis. Pt currently with functional limitations due to the deficits listed below (see PT Problem List).  Pt will benefit from skilled PT to increase their independence and safety with mobility to allow discharge to the venue listed below.       Follow Up Recommendations No PT follow up    Equipment Recommendations  None recommended by PT    Recommendations for Other Services       Precautions / Restrictions Precautions Precautions: None      Mobility  Bed Mobility Overal bed mobility: Needs Assistance Bed Mobility: Supine to Sit     Supine to sit: Min assist;HOB elevated     General bed mobility comments: assist to bring trunk up  Transfers Overall transfer level: Needs assistance Equipment used: Rolling walker (2 wheeled) Transfers: Sit to/from Stand Sit to Stand: Min guard            Ambulation/Gait Ambulation/Gait assistance: Min guard Ambulation Distance (Feet): 280 Feet Assistive device: None Gait Pattern/deviations: Step-through pattern;Antalgic     General Gait Details: Pt with slight limp due to groin hematoma after cath  Stairs            Wheelchair Mobility    Modified Rankin (Stroke Patients Only)       Balance Overall balance assessment: Needs assistance Sitting-balance support: No upper extremity supported Sitting balance-Leahy Scale: Normal     Standing balance support: No upper extremity supported Standing balance-Leahy Scale: Fair                                Pertinent Vitals/Pain Pain Assessment: No/denies pain    Home Living Family/patient expects to be discharged to:: Private residence Living Arrangements: Children Available Help at Discharge: Family Type of Home: House Home Access: Level entry     Home Layout: One level Home Equipment: Kasandra Knudsen - single point      Prior Function Level of Independence: Independent               Hand Dominance        Extremity/Trunk Assessment   Upper Extremity Assessment: Defer to OT evaluation           Lower Extremity Assessment: Overall WFL for tasks assessed         Communication   Communication: No difficulties  Cognition Arousal/Alertness: Awake/alert Behavior During Therapy: WFL for tasks assessed/performed Overall Cognitive Status: Within Functional Limits for tasks assessed                      General Comments      Exercises        Assessment/Plan    PT Assessment Patient needs continued PT services  PT Diagnosis Difficulty walking   PT Problem List Decreased activity tolerance;Decreased balance;Decreased mobility  PT Treatment Interventions DME instruction;Gait training;Functional mobility training;Therapeutic activities;Therapeutic exercise;Balance training;Patient/family education   PT Goals (Current goals can be found in the Care Plan section) Acute Rehab PT Goals Patient Stated Goal: return home PT  Goal Formulation: With patient Time For Goal Achievement: 06/20/14 Potential to Achieve Goals: Good    Frequency Min 3X/week   Barriers to discharge        Co-evaluation               End of Session Equipment Utilized During Treatment: Gait belt Activity Tolerance: Patient tolerated treatment well Patient left: in chair;with call bell/phone within reach;with family/visitor present Nurse Communication: Mobility status         Time: 1011-1024 PT Time Calculation (min) (ACUTE ONLY): 13  min   Charges:   PT Evaluation $Initial PT Evaluation Tier I: 1 Procedure PT Treatments $Gait Training: 8-22 mins   PT G Codes:        Julliana Whitmyer 2014/07/11, 12:29 PM  [employer] PT [phone]

## 2014-06-14 LAB — BASIC METABOLIC PANEL
ANION GAP: 5 (ref 5–15)
BUN: 10 mg/dL (ref 6–23)
CALCIUM: 8.2 mg/dL — AB (ref 8.4–10.5)
CO2: 27 mmol/L (ref 19–32)
CREATININE: 1.05 mg/dL (ref 0.50–1.10)
Chloride: 107 mEq/L (ref 96–112)
GFR calc Af Amer: 54 mL/min — ABNORMAL LOW (ref >90)
GFR, EST NON AFRICAN AMERICAN: 46 mL/min — AB (ref >90)
Glucose, Bld: 92 mg/dL (ref 70–99)
POTASSIUM: 3.4 mmol/L — AB (ref 3.5–5.1)
SODIUM: 139 mmol/L (ref 135–145)

## 2014-06-14 MED ORDER — LISINOPRIL 10 MG PO TABS
10.0000 mg | ORAL_TABLET | Freq: Every day | ORAL | Status: DC
Start: 2014-06-14 — End: 2014-07-02

## 2014-06-14 MED ORDER — POTASSIUM CHLORIDE CRYS ER 20 MEQ PO TBCR
40.0000 meq | EXTENDED_RELEASE_TABLET | Freq: Once | ORAL | Status: AC
  Administered 2014-06-14: 40 meq via ORAL
  Filled 2014-06-14: qty 2

## 2014-06-14 MED ORDER — POLYETHYLENE GLYCOL 3350 17 G PO PACK
17.0000 g | PACK | Freq: Every day | ORAL | Status: DC
  Administered 2014-06-14: 17 g via ORAL
  Filled 2014-06-14: qty 1

## 2014-06-14 MED ORDER — ASPIRIN 81 MG PO CHEW: 81.0000 mg | CHEWABLE_TABLET | Freq: Every day | ORAL | Status: DC

## 2014-06-14 MED ORDER — MAGNESIUM HYDROXIDE 400 MG/5ML PO SUSP
30.0000 mL | Freq: Once | ORAL | Status: AC
  Administered 2014-06-14: 30 mL via ORAL
  Filled 2014-06-14: qty 30

## 2014-06-14 MED ORDER — METOPROLOL SUCCINATE ER 50 MG PO TB24: 50.0000 mg | ORAL_TABLET | Freq: Every day | ORAL | Status: DC

## 2014-06-14 MED ORDER — NITROGLYCERIN 0.4 MG SL SUBL: 0.4000 mg | SUBLINGUAL_TABLET | SUBLINGUAL | Status: AC | PRN

## 2014-06-14 MED ORDER — DILTIAZEM HCL ER COATED BEADS 120 MG PO CP24: 120.0000 mg | ORAL_CAPSULE | ORAL | Status: DC

## 2014-06-14 NOTE — Progress Notes (Signed)
Patient discharge teaching given, including activity, diet, follow-up appoints, and medications. Patient verbalized understanding of all discharge instructions. IV access was d/c'd. Vitals are stable. Skin is intact except as charted in most recent assessments. Pt to be escorted out by NT, to be driven home by family. 

## 2014-06-14 NOTE — Progress Notes (Signed)
    Subjective:  Denies CP or dyspnea   Objective:  Filed Vitals:   06/13/14 2225 06/14/14 0534 06/14/14 0812 06/14/14 1031  BP: 130/68 124/51  130/50  Pulse: 87 70    Temp: 98.3 F (36.8 C) 98.5 F (36.9 C)    TempSrc: Oral Oral    Resp: 18 18    Height:      Weight:      SpO2: 98% 94% 95%     Intake/Output from previous day:  Intake/Output Summary (Last 24 hours) at 06/14/14 1141 Last data filed at 06/14/14 1023  Gross per 24 hour  Intake    895 ml  Output    525 ml  Net    370 ml    Physical Exam: Physical exam: Well-developed well-nourished in no acute distress.  Skin is warm and dry.  HEENT is normal.  Neck is supple.  Chest is clear to auscultation with normal expansion.  Cardiovascular exam is irregular Abdominal exam nontender or distended. No masses palpated. Right groin with  No hematoma and no bruit Extremities show no edema. neuro grossly intact    Lab Results: Basic Metabolic Panel:  Recent Labs  06/12/14 0520 06/13/14 0314 06/14/14 0558  NA 137 137 139  K 3.8 3.6 3.4*  CL 103 103 107  CO2 25 28 27   GLUCOSE 115* 98 92  BUN 11 12 10   CREATININE 1.02 1.08 1.05  CALCIUM 8.8 8.7 8.2*  MG 2.1  --   --    CBC:  Recent Labs  06/12/14 0520 06/13/14 0314  WBC 13.4* 12.2*  HGB 10.7* 10.0*  HCT 33.3* 30.6*  MCV 90.2 90.0  PLT 222 201   Cardiac Enzymes:  Recent Labs  06/11/14 2343 06/12/14 0520 06/13/14 0314  TROPONINI 10.02* 9.67* 7.46*     Assessment/Plan:  1 non-ST elevation myocardial infarction-catheterization revealed no obstructive coronary disease. Continue aspirin. Possibly demand ischemia from atrial fibrillation. 2 permanent atrial fibrillation-continue Cardizem and metoprolol for rate control. Continue aspirin. She is not on Coumadin because of history of GI bleed. 3 history of mitral valve placement. 4 history of prior pacemaker Patient can be discharged from a cardiac standpoint and follow up with Dr.  Radford Pax. Kirk Ruths 06/14/2014, 11:41 AM

## 2014-06-14 NOTE — Progress Notes (Signed)
Physical Therapy Treatment Patient Details Name: ALYAH BOEHNING MRN: [id number] DOB: June 21, 1924 Today's Date: 06/14/2014    History of Present Illness Pt adm with afib with RVR. Pt with demand ischemia of myocardium. PMH - Hx idiopathic hypertropic subaortic aortic stenosis (IHSS) S/P septal myomectomy, HTN, chronic atrial fibrillation, LBBB, bradycardia s/p PPM, severe MR s/p pericardial tissue MVR, mild AI and diastolic dysfunction.    PT Comments    Pt admitted with above diagnosis. Pt currently with functional limitations due to the deficits listed below (see PT Problem List).  Pt ambulating well overall and steady in controlled environment as well as scoring 20/24 on DGI.  Will benefit from continued PT to high level balance activities.   Pt will benefit from skilled PT to increase their independence and safety with mobility to allow discharge to the venue listed below.    Follow Up Recommendations  Home health PT;Supervision - Intermittent (safety eval in home recommended)     Equipment Recommendations  None recommended by PT    Recommendations for Other Services       Precautions / Restrictions Precautions Precautions: None Restrictions Weight Bearing Restrictions: No    Mobility  Bed Mobility Overal bed mobility: Modified Independent Bed Mobility: Supine to Sit     Supine to sit: Supervision;HOB elevated     General bed mobility comments: increased time   Transfers Overall transfer level: Needs assistance Equipment used: None Transfers: Sit to/from Stand Sit to Stand: Min guard         General transfer comment: Pt stands statically to get her bearings when she first gets up.    Ambulation/Gait Ambulation/Gait assistance: Min guard;Min assist Ambulation Distance (Feet): 280 Feet Assistive device: None Gait Pattern/deviations: Step-through pattern;Antalgic   Gait velocity interpretation: Below normal speed for age/gender General Gait Details: Pt with  slight limp due to groin hematoma after cath. Pt is steady on her feet and does not require device in controlled environment without LOB with ambulation today.  Pt stopped by bathroom at end of walk and was independent with hygeine and transfer on and off toilet.     Stairs            Wheelchair Mobility    Modified Rankin (Stroke Patients Only)       Balance Overall balance assessment: Needs assistance Sitting-balance support: No upper extremity supported;Feet supported Sitting balance-Leahy Scale: Normal     Standing balance support: No upper extremity supported;During functional activity Standing balance-Leahy Scale: Good Standing balance comment: Pt able to stand at sink and wash hands without UE support.                      Cognition Arousal/Alertness: Awake/alert Behavior During Therapy: WFL for tasks assessed/performed Overall Cognitive Status: Within Functional Limits for tasks assessed                      Exercises      General Comments General comments (skin integrity, edema, etc.): Scored 20/24 on DGI suggesting pt at low risk of falls.         Pertinent Vitals/Pain Pain Assessment: No/denies pain  VSS    Home Living                      Prior Function            PT Goals (current goals can now be found in the care plan section) Progress towards PT goals:  Progressing toward goals    Frequency  Min 3X/week    PT Plan Current plan remains appropriate    Co-evaluation             End of Session Equipment Utilized During Treatment: Gait belt Activity Tolerance: Patient tolerated treatment well Patient left: in bed;with call bell/phone within reach;with bed alarm set     Time: 1540-0867 PT Time Calculation (min) (ACUTE ONLY): 19 min  Charges:  $Gait Training: 8-22 mins                    G CodesDenice Paradise 06/22/2014, 2:53 PM [employer] Acute Rehabilitation [phone]  (pager)

## 2014-06-14 NOTE — Discharge Summary (Signed)
Physician Discharge Summary  Jasmine [name] MPN:[id number] DOB: Nov 12, 1924 DOA: 06/10/2014  PCP: [name], MD  Admit date: 06/10/2014 Discharge date: 06/14/2014  Time spent: 45 minutes  Recommendations for Outpatient Follow-up:  1. Close cardiology follow up with Dr. Radford Pax for potential adjustments to BP / Rate meds. 2. PCP follow up - please check cbc / bmet (patient with mild leukocytosis, anemia, abnormal K this admit)  Discharge Diagnoses:  Active Problems:   Atrial fibrillation with RVR   Elevated troponin   Demand ischemia of myocardium - converted from diagnosis of non-STEMI   Chronic combined systolic and diastolic CHF (congestive heart failure)   IHSS (idiopathic hypertrophic subaortic stenosis)   Chest pain   Coronary artery disease, non-occlusive by cardiac cath   Systolic and diastolic CHF, chronic   Gastroesophageal reflux disease without esophagitis   Mitral valve regurgitation   Hyperglycemia   Discharge Condition: stable  Diet recommendation: heart healthy   History of present illness:  Jasmine [name] is a 81 y.o. female, with past medical history significant for idiopathic hypertrophic subaortic stenosis and mitral valve regurgitation status post repair - who presented with chest pain radiating to the back and left arm of one day duration.  The patient lives with her daughter and has a history of dementia. She reported associated nausea with dry heaves and cold sweats.  She went to her primary care physician's office and she was found to have atrial fibrillation with rapid ventricular rate so she was advised to come to the emergency room and was started on IV Cardizem drip.  The patient denied chest pain on admission.   She has a history of recurrent GI bleed and is not a candidate for anticoagulation. The patient insisted on being a DO NOT RESUSCITATE  Hospital Course:  Cardiology evaluated Ms. Carie and she was admitted to the step down unit on a diltiazem  drip.  Chronic Atrial fibrillation with acute rapid ventricular rate Ms. Mcraney is not a candidate for long term anticoagulation due to recurrent GI bleed.  Initially she was rate controlled on Cardizem drip and metoprolol 25 mg BID.  She was then transitioned off IV cardizem and was started on oral.  Rate is now controlled and will discharge to home in the care of her family pending Cardiology's agreement.  NSTEMI - Elevated troponin  Ms. Manocchio experienced acute return of chest pain 1/5, and therefore was taken for urgent cath which noted no signif focal CAD - troponin slowly trending downward -  LDL 78  Chronic systolic and diastolic CHF She appears to be well compensated at present - weight stable at apparent baseline of ~78kg - net negative ~434ml  Idiopathic hypertropic subaortic aortic stenosis (IHSS)  S/P repair.    Mitral valve regurgitation S/P bioprosthetic MVR No evidence presently of severe valvular dysfunction   Hyperglycemia  A1c 5.8 (not c/w DM)   Procedures: 1/5 - cardiac cath - Mild LV dysfunction with significant mid distal anterolateral hypocontractility, focal apical akinesis, and mid distal septal wall, severe hypo-to akinesis - No significant coronary obstructive disease with minimal luminal irregularities of the proximal RCA with 10-20% in RCA narrowing and otherwise appearing normal LAD and circumflex vessels.  Consultations:  Cardiology  Discharge Exam: Filed Vitals:   06/13/14 2225 06/14/14 0534 06/14/14 0812 06/14/14 1031  BP: 130/68 124/51  130/50  Pulse: 87 70    Temp: 98.3 F (36.8 C) 98.5 F (36.9 C)    TempSrc: Oral Oral    Resp:  18 18    Height:      Weight:      SpO2: 98% 94% 95%     Filed Weights   06/10/14 1928 06/10/14 2320  Weight: 78.472 kg (173 lb) 77.9 kg (171 lb 11.8 oz)    General:  Wd, elderly pleasant female, quietly sitting in the recliner chair.  Pleasant. CV:  Rate controlled, no frank m/r/g, no lower extremity edema,  no JVD. Resp:  No increased work of breathing, no w/c/r Abdomen:  Soft, nd, nt, +bs, no masses. Extremities:  Able to move all 4, no edema.  Discharge Instructions    Current Discharge Medication List    START taking these medications   Details  aspirin 81 MG chewable tablet Chew 1 tablet (81 mg total) by mouth daily.    diltiazem (CARDIZEM CD) 120 MG 24 hr capsule Take 1 capsule (120 mg total) by mouth daily. Qty: 30 capsule, Refills: 2    nitroGLYCERIN (NITROSTAT) 0.4 MG SL tablet Place 1 tablet (0.4 mg total) under the tongue every 5 (five) minutes as needed for chest pain. Qty: 10 tablet, Refills: 2      CONTINUE these medications which have CHANGED   Details  lisinopril (PRINIVIL,ZESTRIL) 10 MG tablet Take 1 tablet (10 mg total) by mouth daily. Qty: 30 tablet, Refills: 2    metoprolol succinate (TOPROL-XL) 50 MG 24 hr tablet Take 1 tablet (50 mg total) by mouth at bedtime. Take with or immediately following a meal. Qty: 30 tablet, Refills: 2      CONTINUE these medications which have NOT CHANGED   Details  acetaminophen (TYLENOL) 500 MG tablet Take 500 mg by mouth every 6 (six) hours as needed. For pain    albuterol (PROVENTIL HFA;VENTOLIN HFA) 108 (90 BASE) MCG/ACT inhaler Inhale 2 puffs into the lungs every 4 (four) hours as needed for wheezing or shortness of breath. For shortness of breath    dorzolamide (TRUSOPT) 2 % ophthalmic solution Place 1 drop into both eyes 2 (two) times daily.    Ferrous Sulfate (IRON) 28 MG TABS Take 1 tablet by mouth daily.    fluticasone (FLONASE) 50 MCG/ACT nasal spray Place into both nostrils as needed for allergies or rhinitis.    LORazepam (ATIVAN) 1 MG tablet Take 1 mg by mouth at bedtime.     lovastatin (MEVACOR) 20 MG tablet Take 20 mg by mouth at bedtime.    memantine (NAMENDA) 10 MG tablet Take 10 mg by mouth 2 (two) times daily.    Multiple Vitamins-Minerals (ALIVE WOMENS 50+ PO) Take by mouth daily.    pantoprazole  (PROTONIX) 40 MG tablet Take 40 mg by mouth daily.    vitamin E 400 UNIT capsule Take 400 Units by mouth daily.    budesonide (PULMICORT) 180 MCG/ACT inhaler Inhale 2 puffs into the lungs as needed (SOB/Wheezing).     Calcium Carbonate-Vitamin D (CALTRATE 600+D) 600-400 MG-UNIT per tablet Take 1 tablet by mouth daily.    cetirizine (ZYRTEC) 10 MG tablet Take 10 mg by mouth at bedtime as needed for allergies. For allergy symptoms    guaiFENesin (MUCINEX) 600 MG 12 hr tablet Take 600 mg by mouth daily as needed (congestion). For congestion    Simethicone (GAS-X EXTRA STRENGTH) 125 MG CAPS Take 125 mg by mouth as needed (gas).       STOP taking these medications     amLODipine (NORVASC) 10 MG tablet      loperamide (IMODIUM) 1 MG/5ML solution  dexlansoprazole (DEXILANT) 60 MG capsule        Allergies  Allergen Reactions  . Cymbalta [Duloxetine Hcl]     Diarrhea, weakness   Follow-up Information    Follow up with Sueanne Margarita, MD In 1 week.   Specialty:  Cardiology   Contact information:   [address] [place] [address] [phone]       Follow up with [name], MD In 4 days.   Specialty:  Family Medicine   Contact information:   [place] [address] [phone]        The results of significant diagnostics from this hospitalization (including imaging, microbiology, ancillary and laboratory) are listed below for reference.    Significant Diagnostic Studies: Dg Chest Port 1 View  06/10/2014   CLINICAL DATA:  Shortness of breath and central chest pain for 1 day.  EXAM: PORTABLE CHEST - 1 VIEW  COMPARISON:  07/08/2012.  FINDINGS: Interval mildly enlarged cardiac silhouette with increased prominence of the pulmonary vasculature and interstitial markings. Small bilateral pleural effusions. Stable median sternotomy wires and left subclavian pacemaker leads. Mild right shoulder degenerative changes.   IMPRESSION: Interval mild cardiomegaly and mild changes of congestive heart failure.   Electronically Signed   By: Enrique Sack M.D.   On: 06/10/2014 20:23    Microbiology: Recent Results (from the past 240 hour(s))  MRSA PCR Screening     Status: None   Collection Time: 06/10/14 11:24 PM  Result Value Ref Range Status   MRSA by PCR NEGATIVE NEGATIVE Final    Comment:        The GeneXpert MRSA Assay (FDA approved for NASAL specimens only), is one component of a comprehensive MRSA colonization surveillance program. It is not intended to diagnose MRSA infection nor to guide or monitor treatment for MRSA infections.      Labs: Basic Metabolic Panel:  Recent Labs Lab 06/10/14 1952 06/11/14 0536 06/12/14 0520 06/13/14 0314 06/14/14 0558  NA 138 139 137 137 139  K 3.9 4.1 3.8 3.6 3.4*  CL 108 107 103 103 107  CO2 21 22 25 28 27   GLUCOSE 129* 121* 115* 98 92  BUN 7 6 11 12 10   CREATININE 1.09 0.90 1.02 1.08 1.05  CALCIUM 9.1 8.9 8.8 8.7 8.2*  MG 2.2  --  2.1  --   --    Liver Function Tests:  Recent Labs Lab 06/13/14 0314  AST 34  ALT 16  ALKPHOS 44  BILITOT 0.8  PROT 5.8*  ALBUMIN 2.9*   CBC:  Recent Labs Lab 06/10/14 1952 06/11/14 0536 06/12/14 0520 06/13/14 0314  WBC 11.6* 10.3 13.4* 12.2*  HGB 13.4 12.5 10.7* 10.0*  HCT 41.0 38.2 33.3* 30.6*  MCV 90.7 90.1 90.2 90.0  PLT 215 226 222 201   Cardiac Enzymes:  Recent Labs Lab 06/11/14 0536 06/11/14 1745 06/11/14 2343 06/12/14 0520 06/13/14 0314  TROPONINI 11.65* 10.80* 10.02* 9.67* 7.46*     Signed:  Melton Alar, PA-C  [facility] 06/14/2014, 11:13 AM    I have examined the patient and reviewed the chart. I agree with the Advanced Practitioner's note. Patient to be discharged home with meds adjustment and cardiology follow up.  Florencia Reasons MD/PhD [facility].

## 2014-06-17 DIAGNOSIS — N183 Chronic kidney disease, stage 3 (moderate): Secondary | ICD-10-CM | POA: Diagnosis not present

## 2014-06-17 DIAGNOSIS — I421 Obstructive hypertrophic cardiomyopathy: Secondary | ICD-10-CM | POA: Diagnosis not present

## 2014-06-17 DIAGNOSIS — I272 Other secondary pulmonary hypertension: Secondary | ICD-10-CM | POA: Diagnosis not present

## 2014-06-17 DIAGNOSIS — I480 Paroxysmal atrial fibrillation: Secondary | ICD-10-CM | POA: Diagnosis not present

## 2014-06-17 DIAGNOSIS — E785 Hyperlipidemia, unspecified: Secondary | ICD-10-CM | POA: Diagnosis not present

## 2014-06-17 DIAGNOSIS — K219 Gastro-esophageal reflux disease without esophagitis: Secondary | ICD-10-CM | POA: Diagnosis not present

## 2014-06-17 DIAGNOSIS — I2510 Atherosclerotic heart disease of native coronary artery without angina pectoris: Secondary | ICD-10-CM | POA: Diagnosis not present

## 2014-06-17 DIAGNOSIS — I5042 Chronic combined systolic (congestive) and diastolic (congestive) heart failure: Secondary | ICD-10-CM | POA: Diagnosis not present

## 2014-06-17 DIAGNOSIS — I340 Nonrheumatic mitral (valve) insufficiency: Secondary | ICD-10-CM | POA: Diagnosis not present

## 2014-06-20 DIAGNOSIS — N183 Chronic kidney disease, stage 3 (moderate): Secondary | ICD-10-CM | POA: Diagnosis not present

## 2014-06-20 DIAGNOSIS — I272 Other secondary pulmonary hypertension: Secondary | ICD-10-CM | POA: Diagnosis not present

## 2014-06-20 DIAGNOSIS — I340 Nonrheumatic mitral (valve) insufficiency: Secondary | ICD-10-CM | POA: Diagnosis not present

## 2014-06-20 DIAGNOSIS — I421 Obstructive hypertrophic cardiomyopathy: Secondary | ICD-10-CM | POA: Diagnosis not present

## 2014-06-20 DIAGNOSIS — I2510 Atherosclerotic heart disease of native coronary artery without angina pectoris: Secondary | ICD-10-CM | POA: Diagnosis not present

## 2014-06-20 DIAGNOSIS — K219 Gastro-esophageal reflux disease without esophagitis: Secondary | ICD-10-CM | POA: Diagnosis not present

## 2014-06-20 DIAGNOSIS — I480 Paroxysmal atrial fibrillation: Secondary | ICD-10-CM | POA: Diagnosis not present

## 2014-06-20 DIAGNOSIS — I5042 Chronic combined systolic (congestive) and diastolic (congestive) heart failure: Secondary | ICD-10-CM | POA: Diagnosis not present

## 2014-06-20 DIAGNOSIS — E785 Hyperlipidemia, unspecified: Secondary | ICD-10-CM | POA: Diagnosis not present

## 2014-06-21 DIAGNOSIS — K219 Gastro-esophageal reflux disease without esophagitis: Secondary | ICD-10-CM | POA: Diagnosis not present

## 2014-06-21 DIAGNOSIS — I340 Nonrheumatic mitral (valve) insufficiency: Secondary | ICD-10-CM | POA: Diagnosis not present

## 2014-06-21 DIAGNOSIS — I272 Other secondary pulmonary hypertension: Secondary | ICD-10-CM | POA: Diagnosis not present

## 2014-06-21 DIAGNOSIS — N183 Chronic kidney disease, stage 3 (moderate): Secondary | ICD-10-CM | POA: Diagnosis not present

## 2014-06-21 DIAGNOSIS — I5042 Chronic combined systolic (congestive) and diastolic (congestive) heart failure: Secondary | ICD-10-CM | POA: Diagnosis not present

## 2014-06-21 DIAGNOSIS — E785 Hyperlipidemia, unspecified: Secondary | ICD-10-CM | POA: Diagnosis not present

## 2014-06-21 DIAGNOSIS — I2510 Atherosclerotic heart disease of native coronary artery without angina pectoris: Secondary | ICD-10-CM | POA: Diagnosis not present

## 2014-06-21 DIAGNOSIS — I480 Paroxysmal atrial fibrillation: Secondary | ICD-10-CM | POA: Diagnosis not present

## 2014-06-21 DIAGNOSIS — I421 Obstructive hypertrophic cardiomyopathy: Secondary | ICD-10-CM | POA: Diagnosis not present

## 2014-06-22 ENCOUNTER — Emergency Department (HOSPITAL_COMMUNITY): Payer: Commercial Managed Care - HMO

## 2014-06-22 ENCOUNTER — Inpatient Hospital Stay (HOSPITAL_COMMUNITY)
Admission: EM | Admit: 2014-06-22 | Discharge: 2014-06-26 | DRG: 309 | Disposition: A | Discharge status: Home / Self Care | Payer: Commercial Managed Care - HMO | Attending: Internal Medicine | Admitting: Internal Medicine

## 2014-06-22 ENCOUNTER — Encounter (HOSPITAL_COMMUNITY): Payer: Self-pay | Admitting: Emergency Medicine

## 2014-06-22 DIAGNOSIS — R1310 Dysphagia, unspecified: Secondary | ICD-10-CM | POA: Diagnosis not present

## 2014-06-22 DIAGNOSIS — R079 Chest pain, unspecified: Secondary | ICD-10-CM | POA: Diagnosis not present

## 2014-06-22 DIAGNOSIS — N179 Acute kidney failure, unspecified: Secondary | ICD-10-CM | POA: Diagnosis not present

## 2014-06-22 DIAGNOSIS — S301XXA Contusion of abdominal wall, initial encounter: Secondary | ICD-10-CM | POA: Diagnosis not present

## 2014-06-22 DIAGNOSIS — I129 Hypertensive chronic kidney disease with stage 1 through stage 4 chronic kidney disease, or unspecified chronic kidney disease: Secondary | ICD-10-CM | POA: Diagnosis not present

## 2014-06-22 DIAGNOSIS — J45909 Unspecified asthma, uncomplicated: Secondary | ICD-10-CM | POA: Diagnosis present

## 2014-06-22 DIAGNOSIS — I517 Cardiomegaly: Secondary | ICD-10-CM | POA: Diagnosis not present

## 2014-06-22 DIAGNOSIS — Z9842 Cataract extraction status, left eye: Secondary | ICD-10-CM

## 2014-06-22 DIAGNOSIS — I5042 Chronic combined systolic (congestive) and diastolic (congestive) heart failure: Secondary | ICD-10-CM | POA: Diagnosis not present

## 2014-06-22 DIAGNOSIS — J811 Chronic pulmonary edema: Secondary | ICD-10-CM | POA: Diagnosis not present

## 2014-06-22 DIAGNOSIS — Z66 Do not resuscitate: Secondary | ICD-10-CM | POA: Diagnosis present

## 2014-06-22 DIAGNOSIS — I2510 Atherosclerotic heart disease of native coronary artery without angina pectoris: Secondary | ICD-10-CM | POA: Diagnosis present

## 2014-06-22 DIAGNOSIS — Z87891 Personal history of nicotine dependence: Secondary | ICD-10-CM

## 2014-06-22 DIAGNOSIS — R0789 Other chest pain: Secondary | ICD-10-CM | POA: Diagnosis present

## 2014-06-22 DIAGNOSIS — K210 Gastro-esophageal reflux disease with esophagitis: Secondary | ICD-10-CM | POA: Diagnosis present

## 2014-06-22 DIAGNOSIS — I421 Obstructive hypertrophic cardiomyopathy: Secondary | ICD-10-CM | POA: Diagnosis not present

## 2014-06-22 DIAGNOSIS — I248 Other forms of acute ischemic heart disease: Secondary | ICD-10-CM | POA: Diagnosis not present

## 2014-06-22 DIAGNOSIS — Z951 Presence of aortocoronary bypass graft: Secondary | ICD-10-CM | POA: Diagnosis not present

## 2014-06-22 DIAGNOSIS — Z950 Presence of cardiac pacemaker: Secondary | ICD-10-CM | POA: Diagnosis not present

## 2014-06-22 DIAGNOSIS — Z9861 Coronary angioplasty status: Secondary | ICD-10-CM | POA: Diagnosis not present

## 2014-06-22 DIAGNOSIS — N183 Chronic kidney disease, stage 3 unspecified: Secondary | ICD-10-CM | POA: Diagnosis present

## 2014-06-22 DIAGNOSIS — R1033 Periumbilical pain: Secondary | ICD-10-CM | POA: Diagnosis not present

## 2014-06-22 DIAGNOSIS — F0390 Unspecified dementia without behavioral disturbance: Secondary | ICD-10-CM | POA: Diagnosis present

## 2014-06-22 DIAGNOSIS — Z961 Presence of intraocular lens: Secondary | ICD-10-CM | POA: Diagnosis present

## 2014-06-22 DIAGNOSIS — J90 Pleural effusion, not elsewhere classified: Secondary | ICD-10-CM | POA: Diagnosis not present

## 2014-06-22 DIAGNOSIS — Z9049 Acquired absence of other specified parts of digestive tract: Secondary | ICD-10-CM | POA: Diagnosis present

## 2014-06-22 DIAGNOSIS — I272 Other secondary pulmonary hypertension: Secondary | ICD-10-CM | POA: Diagnosis present

## 2014-06-22 DIAGNOSIS — I4891 Unspecified atrial fibrillation: Secondary | ICD-10-CM | POA: Diagnosis present

## 2014-06-22 DIAGNOSIS — K219 Gastro-esophageal reflux disease without esophagitis: Secondary | ICD-10-CM | POA: Diagnosis present

## 2014-06-22 DIAGNOSIS — E780 Pure hypercholesterolemia: Secondary | ICD-10-CM | POA: Diagnosis present

## 2014-06-22 DIAGNOSIS — Z952 Presence of prosthetic heart valve: Secondary | ICD-10-CM

## 2014-06-22 DIAGNOSIS — IMO0001 Reserved for inherently not codable concepts without codable children: Secondary | ICD-10-CM

## 2014-06-22 DIAGNOSIS — D649 Anemia, unspecified: Secondary | ICD-10-CM | POA: Diagnosis present

## 2014-06-22 DIAGNOSIS — T148XXA Other injury of unspecified body region, initial encounter: Secondary | ICD-10-CM

## 2014-06-22 DIAGNOSIS — R112 Nausea with vomiting, unspecified: Secondary | ICD-10-CM | POA: Diagnosis not present

## 2014-06-22 DIAGNOSIS — I252 Old myocardial infarction: Secondary | ICD-10-CM | POA: Diagnosis not present

## 2014-06-22 DIAGNOSIS — I482 Chronic atrial fibrillation: Principal | ICD-10-CM | POA: Diagnosis present

## 2014-06-22 DIAGNOSIS — Z9841 Cataract extraction status, right eye: Secondary | ICD-10-CM

## 2014-06-22 DIAGNOSIS — Z299 Encounter for prophylactic measures, unspecified: Secondary | ICD-10-CM

## 2014-06-22 DIAGNOSIS — I369 Nonrheumatic tricuspid valve disorder, unspecified: Secondary | ICD-10-CM | POA: Diagnosis not present

## 2014-06-22 DIAGNOSIS — Z7901 Long term (current) use of anticoagulants: Secondary | ICD-10-CM | POA: Diagnosis not present

## 2014-06-22 HISTORY — DX: Unspecified dementia, unspecified severity, without behavioral disturbance, psychotic disturbance, mood disturbance, and anxiety: F03.90

## 2014-06-22 NOTE — ED Notes (Signed)
Patient from home where she began having central CP radiating to her left side at approx 6pm that severity worsened with time. Patient reports she had a STEMI and went to cath lab 2 weeks ago, but no stents were placed. Patient was in a-fib on EMS arrival at a rate of 160-170. EMS gave 20mg  of Cardizem push and 5mg /hr Cardizem infusion. Rate now 90-140. Patients RA Sat was 78, EMS placed patient on a NRB and sats came up to 94%. BP 154/82. Patient has received 324 ASA

## 2014-06-22 NOTE — ED Provider Notes (Signed)
CSN: [id number]     Arrival date & time 06/22/14  2335 History  This chart was scribed for Julianne Rice, MD by Rayfield Citizen, ED Scribe. This patient was seen in room B15C/B15C and the patient's care was started at 12:05 AM.    Chief Complaint  Patient presents with  . Chest Pain   Patient is a 81 y.o. female presenting with chest pain. The history is provided by a relative and the patient. No language interpreter was used.  Chest Pain Associated symptoms: abdominal pain   Associated symptoms: no back pain, no dizziness, no fever, no headache, no nausea, no numbness, no palpitations, not vomiting and no weakness      HPI Comments: Jasmine [name] is a 81 y.o. female who presents to the Emergency Department complaining of abdominal pain and chest pain beginning around 18:00 this evening; patient reports she was doing "nothing" at symptom onset. She believed it to be "gas pain"; she took a Gas-X which worsened her symptoms. She is in pain at present. Her chest pain feels as though it is "choking her."   Patient was seen here on 06/10/14 after being seen at her PCP's office; she was found to have atrial fibrillation with rapid ventricular response and sent to the ED for evaluation. Had cardiac cath without any significant coronary obstructive disease during admission. She is not on any blood thinners. PCP is Dr. [name] with [facility].   Past Medical History  Diagnosis Date  . GERD (gastroesophageal reflux disease)   . Insomnia   . Eczema   . Glaucoma   . Diastolic dysfunction   . IHSS (idiopathic hypertrophic subaortic stenosis)     s/p septal myomectomy  . High cholesterol   . Coronary artery disease   . Blood transfusion   . Chronic kidney disease (CKD), stage III (moderate)   . Severe mitral regurgitation     s/p MVR with pericardial tissue valve  . Diverticulosis of colon with hemorrhage 08/12/2012  . GI bleed   . Asthma   . Hypertension   . Reflux esophagitis    . Pulmonary HTN     mild to moderate  . Aortic insufficiency   . Diastolic dysfunction   . Bradycardia     s/p PPM  . Atrial fibrillation and flutter     atrial fibrillation s/p DCCV 06-2012-  now in chronic atrial fibrillation off anticoagulation due to GI bleed  . Paroxysmal atrial tachycardia   . PVCs (premature ventricular contractions)   . DCM (dilated cardiomyopathy)      50% echo 06/2012  . Heart murmur    Past Surgical History  Procedure Laterality Date  . Cardiac catheterization    . Cardiac valve replacement  2007    MVR  . Myomectomy  2007    septal  . Tonsillectomy    . Appendectomy    . Cholecystectomy    . Tubal ligation    . Hernia repair      "in my stomach"  . Cataract extraction w/ intraocular lens  implant, bilateral    . Cardioversion  07/04/2012    Procedure: CARDIOVERSION;  Surgeon: Sueanne Margarita, MD;  Location: [facility];  Service: Cardiovascular;  Laterality: N/A;  h/p in file drawer   . Colonoscopy    . Colon resection    . Colonoscopy N/A 08/13/2012    Procedure: COLONOSCOPY;  Surgeon: Gatha Mayer, MD;  Location: [facility];  Service: Endoscopy;  Laterality: N/A;  .  Permanent pacemaker insertion N/A 10/07/2011    Procedure: PERMANENT PACEMAKER INSERTION;  Surgeon: Deboraha Sprang, MD;  Location: [facility] CATH LAB;  Service: Cardiovascular;  Laterality: N/A;  . Left heart catheterization with coronary angiogram N/A 06/11/2014    Procedure: LEFT HEART CATHETERIZATION WITH CORONARY ANGIOGRAM;  Surgeon: Troy Sine, MD;  Location: [facility] CATH LAB;  Service: Cardiovascular;  Laterality: N/A;   Family History  Problem Relation Age of Onset  . CVA Mother   . Hypertension Mother   . CVA Brother   . Hypertension Brother    History  Substance Use Topics  . Smoking status: Former Smoker    Types: Cigarettes    Quit date: 06/07/1974  . Smokeless tobacco: Former Systems developer    Types: Snuff    Quit date: 04/23/1975     Comment: 10/05/11 "quit smoking & dipping  snuff; been some years ago"  . Alcohol Use: No     Comment: 10/05/11 "used to drink; quit a long time ago"   OB History    No data available     Review of Systems  Constitutional: Negative for fever and chills.  Cardiovascular: Positive for chest pain. Negative for palpitations and leg swelling.  Gastrointestinal: Positive for abdominal pain. Negative for nausea, vomiting, diarrhea, constipation and blood in stool.  Musculoskeletal: Negative for myalgias, back pain, neck pain and neck stiffness.  Neurological: Negative for dizziness, weakness, light-headedness, numbness and headaches.  All other systems reviewed and are negative.  Allergies  Cymbalta  Home Medications   Prior to Admission medications   Medication Sig Start Date End Date Taking? Authorizing Provider  acetaminophen (TYLENOL) 500 MG tablet Take 500 mg by mouth every 6 (six) hours as needed. For pain   Yes Historical Provider, MD  albuterol (PROVENTIL HFA;VENTOLIN HFA) 108 (90 BASE) MCG/ACT inhaler Inhale 2 puffs into the lungs every 4 (four) hours as needed for wheezing or shortness of breath. For shortness of breath   Yes Historical Provider, MD  aspirin 81 MG chewable tablet Chew 1 tablet (81 mg total) by mouth daily. 06/14/14  Yes Marianne L York, PA-C  budesonide (PULMICORT) 180 MCG/ACT inhaler Inhale 2 puffs into the lungs as needed (SOB/Wheezing).    Yes Historical Provider, MD  Calcium Carbonate-Vitamin D (CALTRATE 600+D) 600-400 MG-UNIT per tablet Take 1 tablet by mouth daily.   Yes Historical Provider, MD  cetirizine (ZYRTEC) 10 MG tablet Take 10 mg by mouth at bedtime as needed for allergies. For allergy symptoms   Yes Historical Provider, MD  diltiazem (CARDIZEM CD) 120 MG 24 hr capsule Take 1 capsule (120 mg total) by mouth daily. 06/14/14  Yes Marianne L York, PA-C  dorzolamide (TRUSOPT) 2 % ophthalmic solution Place 1 drop into both eyes 2 (two) times daily.   Yes Historical Provider, MD  Ferrous Sulfate (IRON)  28 MG TABS Take 1 tablet by mouth daily.   Yes Historical Provider, MD  fluticasone (FLONASE) 50 MCG/ACT nasal spray Place into both nostrils as needed for allergies or rhinitis.   Yes Historical Provider, MD  lisinopril (PRINIVIL,ZESTRIL) 10 MG tablet Take 1 tablet (10 mg total) by mouth daily. 06/14/14  Yes Marianne L York, PA-C  LORazepam (ATIVAN) 1 MG tablet Take 1 mg by mouth at bedtime.    Yes Historical Provider, MD  lovastatin (MEVACOR) 20 MG tablet Take 20 mg by mouth at bedtime.   Yes Historical Provider, MD  memantine (NAMENDA) 10 MG tablet Take 10 mg by mouth 2 (two) times daily.  Yes Historical Provider, MD  metoprolol succinate (TOPROL-XL) 50 MG 24 hr tablet Take 1 tablet (50 mg total) by mouth at bedtime. Take with or immediately following a meal. 06/14/14  Yes Marianne L York, PA-C  Multiple Vitamins-Minerals (ALIVE WOMENS 50+ PO) Take by mouth daily.   Yes Historical Provider, MD  nitroGLYCERIN (NITROSTAT) 0.4 MG SL tablet Place 1 tablet (0.4 mg total) under the tongue every 5 (five) minutes as needed for chest pain. 06/14/14  Yes Marianne L York, PA-C  pantoprazole (PROTONIX) 40 MG tablet Take 40 mg by mouth daily. 01/29/14  Yes Historical Provider, MD  Simethicone (GAS-X EXTRA STRENGTH) 125 MG CAPS Take 125 mg by mouth as needed (gas).    Yes Historical Provider, MD  vitamin E 400 UNIT capsule Take 400 Units by mouth daily.   Yes Historical Provider, MD   BP 140/71 mmHg  Pulse 83  Temp(Src) 98.1 F (36.7 C) (Oral)  Resp 14  SpO2 100% Physical Exam  Constitutional: She is oriented to person, place, and time. She appears well-developed and well-nourished. No distress.  HENT:  Head: Normocephalic and atraumatic.  Mouth/Throat: Oropharynx is clear and moist.  Eyes: EOM are normal. Pupils are equal, round, and reactive to light.  Neck: Normal range of motion. Neck supple.  Cardiovascular:  Tachycardia. Irregularly irregular  Pulmonary/Chest: Effort normal and breath sounds normal.  No respiratory distress. She has no wheezes. She has no rales.  Abdominal: Soft. Bowel sounds are normal. There is tenderness (epigastric tenderness with palpation.).  Musculoskeletal: Normal range of motion. She exhibits no edema or tenderness.  No lower extremity swelling or tenderness.  Neurological: She is alert and oriented to person, place, and time.  Moves all extremities without deficit. Sensation is grossly intact.  Skin: Skin is warm and dry. No rash noted. No erythema.  Psychiatric: She has a normal mood and affect. Her behavior is normal.  Nursing note and vitals reviewed.   ED Course  Procedures   DIAGNOSTIC STUDIES: Oxygen Saturation is 100% on RA, normal by my interpretation.    COORDINATION OF CARE: 12:10 AM Discussed treatment plan with pt at bedside and pt agreed to plan.   Labs Review Labs Reviewed  CBC - Abnormal; Notable for the following:    RBC 3.60 (*)    Hemoglobin 10.5 (*)    HCT 33.1 (*)    All other components within normal limits  BASIC METABOLIC PANEL - Abnormal; Notable for the following:    Glucose, Bld 103 (*)    GFR calc non Af Amer 47 (*)    GFR calc Af Amer 55 (*)    All other components within normal limits  BRAIN NATRIURETIC PEPTIDE - Abnormal; Notable for the following:    B Natriuretic Peptide 430.5 (*)    All other components within normal limits  LIPASE, BLOOD  HEPATIC FUNCTION PANEL  Randolm Idol, ED    Imaging Review Dg Chest Port 1 View  06/23/2014   CLINICAL DATA:  Chest pain and shortness of breath.  EXAM: PORTABLE CHEST - 1 VIEW  COMPARISON:  06/10/2014  FINDINGS: Patient is post median sternotomy. Dual lead left-sided pacemaker remains in place. Cardiomegaly is stable. There is blunting of the costophrenic angles that appears similar to prior exam. Mild vascular congestion. No confluent airspace disease. No pneumothorax. No acute osseous abnormality.  IMPRESSION: Cardiomegaly, mild vascular congestion, and bilateral  pleural effusions. These findings are unchanged and likely represent mild congestive failure.   Electronically Signed  By: Jeb Levering M.D.   On: 06/23/2014 00:50     EKG Interpretation   Date/Time:  Saturday June 22 2014 23:45:49 EST Ventricular Rate:  137 PR Interval:    QRS Duration: 142 QT Interval:  327 QTC Calculation: 494 R Axis:   13 Text Interpretation:  Atrial fibrillation Left bundle branch block  Confirmed by Bexlee Bergdoll  MD, Shaheer Bonfield ([id number]) on 06/22/2014 11:51:21 PM      MDM   Final diagnoses:  Atypical chest pain  Atrial fibrillation with RVR    I personally performed the services described in this documentation, which was scribed in my presence. The recorded information has been reviewed and considered.   Patient with atypical chest pain. Recent cardiac cath without obstructive disease. EKG demonstrating a fibrillation with RVR. Old left bundle-branch block. Normal troponin. Improved tachycardia on Cardizem drip. Discussed with Dr. Arnoldo Morale. Will admit to step down bed.  Julianne Rice, MD 06/23/14 [phone]

## 2014-06-23 DIAGNOSIS — N179 Acute kidney failure, unspecified: Secondary | ICD-10-CM | POA: Diagnosis not present

## 2014-06-23 DIAGNOSIS — N183 Chronic kidney disease, stage 3 unspecified: Secondary | ICD-10-CM | POA: Diagnosis present

## 2014-06-23 DIAGNOSIS — Z951 Presence of aortocoronary bypass graft: Secondary | ICD-10-CM | POA: Diagnosis not present

## 2014-06-23 DIAGNOSIS — I5042 Chronic combined systolic (congestive) and diastolic (congestive) heart failure: Secondary | ICD-10-CM

## 2014-06-23 DIAGNOSIS — R0789 Other chest pain: Secondary | ICD-10-CM | POA: Diagnosis present

## 2014-06-23 DIAGNOSIS — K210 Gastro-esophageal reflux disease with esophagitis: Secondary | ICD-10-CM | POA: Diagnosis present

## 2014-06-23 DIAGNOSIS — E780 Pure hypercholesterolemia: Secondary | ICD-10-CM | POA: Diagnosis present

## 2014-06-23 DIAGNOSIS — Z961 Presence of intraocular lens: Secondary | ICD-10-CM | POA: Diagnosis present

## 2014-06-23 DIAGNOSIS — Z87891 Personal history of nicotine dependence: Secondary | ICD-10-CM | POA: Diagnosis not present

## 2014-06-23 DIAGNOSIS — Z9049 Acquired absence of other specified parts of digestive tract: Secondary | ICD-10-CM | POA: Diagnosis present

## 2014-06-23 DIAGNOSIS — I2510 Atherosclerotic heart disease of native coronary artery without angina pectoris: Secondary | ICD-10-CM | POA: Diagnosis present

## 2014-06-23 DIAGNOSIS — Z9842 Cataract extraction status, left eye: Secondary | ICD-10-CM | POA: Diagnosis not present

## 2014-06-23 DIAGNOSIS — I272 Other secondary pulmonary hypertension: Secondary | ICD-10-CM | POA: Diagnosis present

## 2014-06-23 DIAGNOSIS — Z66 Do not resuscitate: Secondary | ICD-10-CM | POA: Diagnosis present

## 2014-06-23 DIAGNOSIS — D649 Anemia, unspecified: Secondary | ICD-10-CM | POA: Diagnosis present

## 2014-06-23 DIAGNOSIS — Z952 Presence of prosthetic heart valve: Secondary | ICD-10-CM | POA: Diagnosis not present

## 2014-06-23 DIAGNOSIS — Z7901 Long term (current) use of anticoagulants: Secondary | ICD-10-CM

## 2014-06-23 DIAGNOSIS — I129 Hypertensive chronic kidney disease with stage 1 through stage 4 chronic kidney disease, or unspecified chronic kidney disease: Secondary | ICD-10-CM | POA: Diagnosis present

## 2014-06-23 DIAGNOSIS — K219 Gastro-esophageal reflux disease without esophagitis: Secondary | ICD-10-CM

## 2014-06-23 DIAGNOSIS — Z299 Encounter for prophylactic measures, unspecified: Secondary | ICD-10-CM

## 2014-06-23 DIAGNOSIS — F0390 Unspecified dementia without behavioral disturbance: Secondary | ICD-10-CM | POA: Diagnosis present

## 2014-06-23 DIAGNOSIS — Z9841 Cataract extraction status, right eye: Secondary | ICD-10-CM | POA: Diagnosis not present

## 2014-06-23 DIAGNOSIS — I248 Other forms of acute ischemic heart disease: Secondary | ICD-10-CM | POA: Diagnosis present

## 2014-06-23 DIAGNOSIS — I421 Obstructive hypertrophic cardiomyopathy: Secondary | ICD-10-CM

## 2014-06-23 DIAGNOSIS — J45909 Unspecified asthma, uncomplicated: Secondary | ICD-10-CM | POA: Diagnosis present

## 2014-06-23 DIAGNOSIS — I4891 Unspecified atrial fibrillation: Secondary | ICD-10-CM

## 2014-06-23 DIAGNOSIS — Z9861 Coronary angioplasty status: Secondary | ICD-10-CM | POA: Diagnosis not present

## 2014-06-23 DIAGNOSIS — S301XXA Contusion of abdominal wall, initial encounter: Secondary | ICD-10-CM | POA: Diagnosis present

## 2014-06-23 DIAGNOSIS — Z950 Presence of cardiac pacemaker: Secondary | ICD-10-CM | POA: Diagnosis not present

## 2014-06-23 DIAGNOSIS — I482 Chronic atrial fibrillation: Secondary | ICD-10-CM | POA: Diagnosis present

## 2014-06-23 DIAGNOSIS — I252 Old myocardial infarction: Secondary | ICD-10-CM | POA: Diagnosis not present

## 2014-06-23 LAB — BASIC METABOLIC PANEL
ANION GAP: 9 (ref 5–15)
Anion gap: 9 (ref 5–15)
BUN: 7 mg/dL (ref 6–23)
BUN: 7 mg/dL (ref 6–23)
CALCIUM: 8.9 mg/dL (ref 8.4–10.5)
CHLORIDE: 104 meq/L (ref 96–112)
CO2: 22 mmol/L (ref 19–32)
CO2: 23 mmol/L (ref 19–32)
Calcium: 8.7 mg/dL (ref 8.4–10.5)
Chloride: 108 mEq/L (ref 96–112)
Creatinine, Ser: 1.03 mg/dL (ref 0.50–1.10)
Creatinine, Ser: 1.04 mg/dL (ref 0.50–1.10)
GFR calc Af Amer: 54 mL/min — ABNORMAL LOW (ref >90)
GFR calc Af Amer: 55 mL/min — ABNORMAL LOW (ref >90)
GFR calc non Af Amer: 47 mL/min — ABNORMAL LOW (ref >90)
GFR, EST NON AFRICAN AMERICAN: 47 mL/min — AB (ref >90)
Glucose, Bld: 103 mg/dL — ABNORMAL HIGH (ref 70–99)
Glucose, Bld: 97 mg/dL (ref 70–99)
Potassium: 3.7 mmol/L (ref 3.5–5.1)
Potassium: 4 mmol/L (ref 3.5–5.1)
SODIUM: 140 mmol/L (ref 135–145)
Sodium: 135 mmol/L (ref 135–145)

## 2014-06-23 LAB — I-STAT TROPONIN, ED: Troponin i, poc: 0.06 ng/mL (ref 0.00–0.08)

## 2014-06-23 LAB — CBC
HCT: 31.3 % — ABNORMAL LOW (ref 36.0–46.0)
HEMATOCRIT: 33.1 % — AB (ref 36.0–46.0)
HEMOGLOBIN: 10.5 g/dL — AB (ref 12.0–15.0)
Hemoglobin: 10 g/dL — ABNORMAL LOW (ref 12.0–15.0)
MCH: 29.2 pg (ref 26.0–34.0)
MCH: 29.4 pg (ref 26.0–34.0)
MCHC: 31.7 g/dL (ref 30.0–36.0)
MCHC: 31.9 g/dL (ref 30.0–36.0)
MCV: 91.9 fL (ref 78.0–100.0)
MCV: 92.1 fL (ref 78.0–100.0)
PLATELETS: 373 10*3/uL (ref 150–400)
Platelets: 400 10*3/uL (ref 150–400)
RBC: 3.4 MIL/uL — ABNORMAL LOW (ref 3.87–5.11)
RBC: 3.6 MIL/uL — ABNORMAL LOW (ref 3.87–5.11)
RDW: 14.9 % (ref 11.5–15.5)
RDW: 15 % (ref 11.5–15.5)
WBC: 10.2 10*3/uL (ref 4.0–10.5)
WBC: 9.5 10*3/uL (ref 4.0–10.5)

## 2014-06-23 LAB — HEPATIC FUNCTION PANEL
ALBUMIN: 3.3 g/dL — AB (ref 3.5–5.2)
ALK PHOS: 53 U/L (ref 39–117)
ALT: 14 U/L (ref 0–35)
AST: 23 U/L (ref 0–37)
BILIRUBIN DIRECT: 0.2 mg/dL (ref 0.0–0.3)
BILIRUBIN INDIRECT: 0.7 mg/dL (ref 0.3–0.9)
Total Bilirubin: 0.9 mg/dL (ref 0.3–1.2)
Total Protein: 6.5 g/dL (ref 6.0–8.3)

## 2014-06-23 LAB — TROPONIN I
Troponin I: 0.06 ng/mL — ABNORMAL HIGH (ref <0.031)
Troponin I: 0.05 ng/mL — ABNORMAL HIGH (ref <0.031)
Troponin I: 0.05 ng/mL — ABNORMAL HIGH (ref <0.031)

## 2014-06-23 LAB — BRAIN NATRIURETIC PEPTIDE: B NATRIURETIC PEPTIDE 5: 430.5 pg/mL — AB (ref 0.0–100.0)

## 2014-06-23 LAB — MRSA PCR SCREENING: MRSA by PCR: NEGATIVE

## 2014-06-23 LAB — LIPASE, BLOOD: Lipase: 29 U/L (ref 11–59)

## 2014-06-23 MED ORDER — GI COCKTAIL ~~LOC~~
30.0000 mL | Freq: Once | ORAL | Status: AC
  Administered 2014-06-23: 30 mL via ORAL
  Filled 2014-06-23: qty 30

## 2014-06-23 MED ORDER — ACETAMINOPHEN 325 MG PO TABS: 650.0000 mg | ORAL_TABLET | Freq: Four times a day (QID) | ORAL | Status: DC | PRN

## 2014-06-23 MED ORDER — NITROGLYCERIN 0.4 MG SL SUBL: 0.4000 mg | SUBLINGUAL_TABLET | SUBLINGUAL | Status: DC | PRN

## 2014-06-23 MED ORDER — ONDANSETRON HCL 4 MG/2ML IJ SOLN
4.0000 mg | Freq: Four times a day (QID) | INTRAMUSCULAR | Status: DC | PRN
  Administered 2014-06-25 (×2): 4 mg via INTRAVENOUS
  Filled 2014-06-23 (×2): qty 2

## 2014-06-23 MED ORDER — MEMANTINE HCL 10 MG PO TABS
10.0000 mg | ORAL_TABLET | Freq: Two times a day (BID) | ORAL | Status: DC
  Administered 2014-06-23 – 2014-06-26 (×7): 10 mg via ORAL
  Filled 2014-06-23 (×8): qty 1

## 2014-06-23 MED ORDER — SODIUM CHLORIDE 0.9 % IJ SOLN: 3.0000 mL | INTRAMUSCULAR | Status: DC | PRN

## 2014-06-23 MED ORDER — PANTOPRAZOLE SODIUM 40 MG PO TBEC
40.0000 mg | DELAYED_RELEASE_TABLET | Freq: Every day | ORAL | Status: DC
  Administered 2014-06-23: 40 mg via ORAL
  Filled 2014-06-23 (×2): qty 1

## 2014-06-23 MED ORDER — LORAZEPAM 1 MG PO TABS
1.0000 mg | ORAL_TABLET | Freq: Every day | ORAL | Status: DC
Start: 2014-06-23 — End: 2014-06-26
  Administered 2014-06-23 – 2014-06-25 (×3): 1 mg via ORAL
  Filled 2014-06-23 (×3): qty 1

## 2014-06-23 MED ORDER — LORATADINE 10 MG PO TABS
10.0000 mg | ORAL_TABLET | Freq: Every day | ORAL | Status: DC
  Administered 2014-06-23 – 2014-06-26 (×4): 10 mg via ORAL
  Filled 2014-06-23 (×4): qty 1

## 2014-06-23 MED ORDER — HEPARIN SODIUM (PORCINE) 5000 UNIT/ML IJ SOLN
5000.0000 [IU] | Freq: Three times a day (TID) | INTRAMUSCULAR | Status: DC
  Administered 2014-06-23 – 2014-06-26 (×9): 5000 [IU] via SUBCUTANEOUS
  Filled 2014-06-23 (×11): qty 1

## 2014-06-23 MED ORDER — ONDANSETRON HCL 4 MG PO TABS: 4.0000 mg | ORAL_TABLET | Freq: Four times a day (QID) | ORAL | Status: DC | PRN

## 2014-06-23 MED ORDER — SODIUM CHLORIDE 0.9 % IJ SOLN
3.0000 mL | Freq: Two times a day (BID) | INTRAMUSCULAR | Status: DC
  Administered 2014-06-23 – 2014-06-26 (×6): 3 mL via INTRAVENOUS

## 2014-06-23 MED ORDER — FLUTICASONE PROPIONATE 50 MCG/ACT NA SUSP
1.0000 | Freq: Every day | NASAL | Status: DC
  Administered 2014-06-23 – 2014-06-26 (×4): 1 via NASAL
  Filled 2014-06-23: qty 16

## 2014-06-23 MED ORDER — OXYCODONE HCL 5 MG PO TABS: 5.0000 mg | ORAL_TABLET | ORAL | Status: DC | PRN

## 2014-06-23 MED ORDER — POTASSIUM CHLORIDE CRYS ER 10 MEQ PO TBCR
10.0000 meq | EXTENDED_RELEASE_TABLET | Freq: Every day | ORAL | Status: AC
  Administered 2014-06-23 – 2014-06-26 (×4): 10 meq via ORAL
  Filled 2014-06-23 (×4): qty 1

## 2014-06-23 MED ORDER — FERROUS SULFATE 325 (65 FE) MG PO TABS
325.0000 mg | ORAL_TABLET | Freq: Every day | ORAL | Status: DC
  Administered 2014-06-23 – 2014-06-26 (×4): 325 mg via ORAL
  Filled 2014-06-23 (×4): qty 1

## 2014-06-23 MED ORDER — SODIUM CHLORIDE 0.9 % IV SOLN: 250.0000 mL | INTRAVENOUS | Status: DC | PRN

## 2014-06-23 MED ORDER — FENTANYL CITRATE 0.05 MG/ML IJ SOLN
25.0000 ug | Freq: Once | INTRAMUSCULAR | Status: AC
  Administered 2014-06-23: 25 ug via INTRAVENOUS
  Filled 2014-06-23: qty 2

## 2014-06-23 MED ORDER — DILTIAZEM HCL ER COATED BEADS 120 MG PO CP24
120.0000 mg | ORAL_CAPSULE | ORAL | Status: DC
  Administered 2014-06-23 – 2014-06-26 (×4): 120 mg via ORAL
  Filled 2014-06-23 (×5): qty 1

## 2014-06-23 MED ORDER — DILTIAZEM HCL 25 MG/5ML IV SOLN
5.0000 mg | Freq: Once | INTRAVENOUS | Status: AC
  Administered 2014-06-23: 5 mg via INTRAVENOUS
  Filled 2014-06-23: qty 5

## 2014-06-23 MED ORDER — PRAVASTATIN SODIUM 20 MG PO TABS
20.0000 mg | ORAL_TABLET | Freq: Every day | ORAL | Status: DC
  Administered 2014-06-23 – 2014-06-25 (×3): 20 mg via ORAL
  Filled 2014-06-23 (×4): qty 1

## 2014-06-23 MED ORDER — VITAMIN E 180 MG (400 UNIT) PO CAPS
400.0000 [IU] | ORAL_CAPSULE | Freq: Every day | ORAL | Status: DC
  Administered 2014-06-23 – 2014-06-26 (×4): 400 [IU] via ORAL
  Filled 2014-06-23 (×4): qty 1

## 2014-06-23 MED ORDER — DORZOLAMIDE HCL 2 % OP SOLN
1.0000 [drp] | Freq: Two times a day (BID) | OPHTHALMIC | Status: DC
  Administered 2014-06-23 – 2014-06-26 (×7): 1 [drp] via OPHTHALMIC
  Filled 2014-06-23: qty 10

## 2014-06-23 MED ORDER — ASPIRIN 81 MG PO CHEW
81.0000 mg | CHEWABLE_TABLET | Freq: Every day | ORAL | Status: DC
  Administered 2014-06-23 – 2014-06-26 (×4): 81 mg via ORAL
  Filled 2014-06-23 (×4): qty 1

## 2014-06-23 MED ORDER — DILTIAZEM HCL 100 MG IV SOLR
5.0000 mg/h | Freq: Once | INTRAVENOUS | Status: AC
  Administered 2014-06-23: 5 mg/h via INTRAVENOUS

## 2014-06-23 MED ORDER — ALUM & MAG HYDROXIDE-SIMETH 200-200-20 MG/5ML PO SUSP
30.0000 mL | Freq: Four times a day (QID) | ORAL | Status: DC | PRN
  Administered 2014-06-24 – 2014-06-25 (×2): 30 mL via ORAL
  Filled 2014-06-23 (×2): qty 30

## 2014-06-23 MED ORDER — FUROSEMIDE 20 MG PO TABS
20.0000 mg | ORAL_TABLET | Freq: Two times a day (BID) | ORAL | Status: AC
  Administered 2014-06-23 – 2014-06-24 (×4): 20 mg via ORAL
  Filled 2014-06-23 (×4): qty 1

## 2014-06-23 MED ORDER — SODIUM CHLORIDE 0.9 % IJ SOLN
3.0000 mL | Freq: Two times a day (BID) | INTRAMUSCULAR | Status: DC
  Administered 2014-06-23 – 2014-06-26 (×5): 3 mL via INTRAVENOUS

## 2014-06-23 MED ORDER — ACETAMINOPHEN 650 MG RE SUPP: 650.0000 mg | Freq: Four times a day (QID) | RECTAL | Status: DC | PRN

## 2014-06-23 MED ORDER — METOPROLOL SUCCINATE ER 50 MG PO TB24
50.0000 mg | ORAL_TABLET | Freq: Every day | ORAL | Status: DC
  Administered 2014-06-23 – 2014-06-25 (×3): 50 mg via ORAL
  Filled 2014-06-23 (×4): qty 1

## 2014-06-23 MED ORDER — CALCIUM CARBONATE-VITAMIN D 500-200 MG-UNIT PO TABS
1.0000 | ORAL_TABLET | Freq: Every day | ORAL | Status: DC
  Administered 2014-06-23 – 2014-06-26 (×4): 1 via ORAL
  Filled 2014-06-23 (×4): qty 1

## 2014-06-23 MED ORDER — FLUTICASONE PROPIONATE HFA 44 MCG/ACT IN AERO
2.0000 | INHALATION_SPRAY | Freq: Two times a day (BID) | RESPIRATORY_TRACT | Status: DC
  Administered 2014-06-23 – 2014-06-26 (×7): 2 via RESPIRATORY_TRACT
  Filled 2014-06-23: qty 10.6

## 2014-06-23 MED ORDER — HYDROMORPHONE HCL 1 MG/ML IJ SOLN
0.5000 mg | INTRAMUSCULAR | Status: DC | PRN
  Administered 2014-06-23: 0.5 mg via INTRAVENOUS
  Administered 2014-06-23: 1 mg via INTRAVENOUS
  Administered 2014-06-23: 0.5 mg via INTRAVENOUS
  Administered 2014-06-25: 1 mg via INTRAVENOUS
  Filled 2014-06-23 (×4): qty 1

## 2014-06-23 MED ORDER — ENOXAPARIN SODIUM 80 MG/0.8ML ~~LOC~~ SOLN
1.0000 mg/kg | Freq: Two times a day (BID) | SUBCUTANEOUS | Status: DC
  Administered 2014-06-23: 80 mg via SUBCUTANEOUS
  Filled 2014-06-23 (×3): qty 0.8

## 2014-06-23 MED ORDER — LISINOPRIL 10 MG PO TABS
10.0000 mg | ORAL_TABLET | Freq: Every day | ORAL | Status: DC
  Filled 2014-06-23 (×2): qty 1

## 2014-06-23 MED ORDER — PANTOPRAZOLE SODIUM 40 MG IV SOLR
40.0000 mg | Freq: Once | INTRAVENOUS | Status: AC
Start: 2014-06-23 — End: 2014-06-23
  Administered 2014-06-23: 40 mg via INTRAVENOUS
  Filled 2014-06-23: qty 40

## 2014-06-23 NOTE — H&P (Signed)
[facility] Admission History and Physical       Jasmine [name] SWF:[id number] DOB: May 05, 1925 DOA: 06/22/2014  Referring physician:  EDP PCP: [name], MD  Specialists:   Chief Complaint: Chest Pain and Palpitations  HPI: Jasmine [name] is a 81 y.o. female with a history of CAD S/P CABG and recent PCI on 06/11/2014 revealing NonObstructive Disease findings, Hx of IHSS, Atrial Fibrillation, Diastolic CHF , Pulmonary HTN who presents to the ED with complaints of substernal area chest pain and Epigastric ABD Pain and palpitations since 6 pm.   The pain was relieved after NTG, and in the ED she was found to have   Review of Systems:  Constitutional: No Weight Loss, No Weight Gain, Night Sweats, Fevers, Chills, Dizziness, Fatigue, or Generalized Weakness HEENT: No Headaches, Difficulty Swallowing,Tooth/Dental Problems,Sore Throat,  No Sneezing, Rhinitis, Ear Ache, Nasal Congestion, or Post Nasal Drip,  Cardio-vascular:  +Chest pain, Orthopnea, PND, Edema in Lower Extremities, Anasarca, Dizziness, +Palpitations  Resp: +Dyspnea, No DOE, No Productive Cough, No Non-Productive Cough, No Hemoptysis, No Wheezing.    GI: No Heartburn, Indigestion, Abdominal Pain, Nausea, Vomiting, Diarrhea, Hematemesis, Hematochezia, Melena, Change in Bowel Habits,  Loss of Appetite  GU: No Dysuria, Change in Color of Urine, No Urgency or Frequency, No Flank pain.  Musculoskeletal: No Joint Pain or Swelling, No Decreased Range of Motion, No Back Pain.  Neurologic: No Syncope, No Seizures, Muscle Weakness, Paresthesia, Vision Disturbance or Loss, No Diplopia, No Vertigo, No Difficulty Walking,  Skin: No Rash or Lesions. Psych: No Change in Mood or Affect, No Depression or Anxiety, No Memory loss, No Confusion, or Hallucinations   Past Medical History  Diagnosis Date  . GERD (gastroesophageal reflux disease)   . Insomnia   . Eczema   . Glaucoma   . Diastolic dysfunction   . IHSS (idiopathic  hypertrophic subaortic stenosis)     s/p septal myomectomy  . High cholesterol   . Coronary artery disease   . Blood transfusion   . Chronic kidney disease (CKD), stage III (moderate)   . Severe mitral regurgitation     s/p MVR with pericardial tissue valve  . Diverticulosis of colon with hemorrhage 08/12/2012  . GI bleed   . Asthma   . Hypertension   . Reflux esophagitis   . Pulmonary HTN     mild to moderate  . Aortic insufficiency   . Diastolic dysfunction   . Bradycardia     s/p PPM  . Atrial fibrillation and flutter     atrial fibrillation s/p DCCV 06-2012-  now in chronic atrial fibrillation off anticoagulation due to GI bleed  . Paroxysmal atrial tachycardia   . PVCs (premature ventricular contractions)   . DCM (dilated cardiomyopathy)      50% echo 06/2012  . Heart murmur       Past Surgical History  Procedure Laterality Date  . Cardiac catheterization    . Cardiac valve replacement  2007    MVR  . Myomectomy  2007    septal  . Tonsillectomy    . Appendectomy    . Cholecystectomy    . Tubal ligation    . Hernia repair      "in my stomach"  . Cataract extraction w/ intraocular lens  implant, bilateral    . Cardioversion  07/04/2012    Procedure: CARDIOVERSION;  Surgeon: Sueanne Margarita, MD;  Location: [facility];  Service: Cardiovascular;  Laterality: N/A;  h/p in file drawer   .  Colonoscopy    . Colon resection    . Colonoscopy N/A 08/13/2012    Procedure: COLONOSCOPY;  Surgeon: Gatha Mayer, MD;  Location: [facility];  Service: Endoscopy;  Laterality: N/A;  . Permanent pacemaker insertion N/A 10/07/2011    Procedure: PERMANENT PACEMAKER INSERTION;  Surgeon: Deboraha Sprang, MD;  Location[facility] CATH LAB;  Service: Cardiovascular;  Laterality: N/A;  . Left heart catheterization with coronary angiogram N/A 06/11/2014    Procedure: LEFT HEART CATHETERIZATION WITH CORONARY ANGIOGRAM;  Surgeon: Troy Sine, MD;  Location: [facility] CATH LAB;  Service: Cardiovascular;   Laterality: N/A;       Prior to Admission medications   Medication Sig Start Date End Date Taking? Authorizing Provider  acetaminophen (TYLENOL) 500 MG tablet Take 500 mg by mouth every 6 (six) hours as needed. For pain   Yes Historical Provider, MD  albuterol (PROVENTIL HFA;VENTOLIN HFA) 108 (90 BASE) MCG/ACT inhaler Inhale 2 puffs into the lungs every 4 (four) hours as needed for wheezing or shortness of breath. For shortness of breath   Yes Historical Provider, MD  aspirin 81 MG chewable tablet Chew 1 tablet (81 mg total) by mouth daily. 06/14/14  Yes Marianne L York, PA-C  budesonide (PULMICORT) 180 MCG/ACT inhaler Inhale 2 puffs into the lungs as needed (SOB/Wheezing).    Yes Historical Provider, MD  Calcium Carbonate-Vitamin D (CALTRATE 600+D) 600-400 MG-UNIT per tablet Take 1 tablet by mouth daily.   Yes Historical Provider, MD  cetirizine (ZYRTEC) 10 MG tablet Take 10 mg by mouth at bedtime as needed for allergies. For allergy symptoms   Yes Historical Provider, MD  diltiazem (CARDIZEM CD) 120 MG 24 hr capsule Take 1 capsule (120 mg total) by mouth daily. 06/14/14  Yes Marianne L York, PA-C  dorzolamide (TRUSOPT) 2 % ophthalmic solution Place 1 drop into both eyes 2 (two) times daily.   Yes Historical Provider, MD  Ferrous Sulfate (IRON) 28 MG TABS Take 1 tablet by mouth daily.   Yes Historical Provider, MD  fluticasone (FLONASE) 50 MCG/ACT nasal spray Place into both nostrils as needed for allergies or rhinitis.   Yes Historical Provider, MD  lisinopril (PRINIVIL,ZESTRIL) 10 MG tablet Take 1 tablet (10 mg total) by mouth daily. 06/14/14  Yes Marianne L York, PA-C  LORazepam (ATIVAN) 1 MG tablet Take 1 mg by mouth at bedtime.    Yes Historical Provider, MD  lovastatin (MEVACOR) 20 MG tablet Take 20 mg by mouth at bedtime.   Yes Historical Provider, MD  memantine (NAMENDA) 10 MG tablet Take 10 mg by mouth 2 (two) times daily.   Yes Historical Provider, MD  metoprolol succinate (TOPROL-XL) 50 MG  24 hr tablet Take 1 tablet (50 mg total) by mouth at bedtime. Take with or immediately following a meal. 06/14/14  Yes Marianne L York, PA-C  Multiple Vitamins-Minerals (ALIVE WOMENS 50+ PO) Take by mouth daily.   Yes Historical Provider, MD  nitroGLYCERIN (NITROSTAT) 0.4 MG SL tablet Place 1 tablet (0.4 mg total) under the tongue every 5 (five) minutes as needed for chest pain. 06/14/14  Yes Marianne L York, PA-C  pantoprazole (PROTONIX) 40 MG tablet Take 40 mg by mouth daily. 01/29/14  Yes Historical Provider, MD  Simethicone (GAS-X EXTRA STRENGTH) 125 MG CAPS Take 125 mg by mouth as needed (gas).    Yes Historical Provider, MD  vitamin E 400 UNIT capsule Take 400 Units by mouth daily.   Yes Historical Provider, MD      Allergies  Allergen Reactions  . Cymbalta [Duloxetine Hcl]     Diarrhea, weakness     Social History:  reports that she quit smoking about 40 years ago. Her smoking use included Cigarettes. She quit smokeless tobacco use about 39 years ago. Her smokeless tobacco use included Snuff. She reports that she does not drink alcohol or use illicit drugs.     Family History  Problem Relation Age of Onset  . CVA Mother   . Hypertension Mother   . CVA Brother   . Hypertension Brother        Physical Exam:  GEN: Pleasant Obese 81 y.o. African American  female  examined  and in no acute distress; cooperative with exam Filed Vitals:   06/23/14 0030 06/23/14 0100 06/23/14 0134 06/23/14 0230  BP: 141/77 131/94 136/85 140/71  Pulse: 110 128 72 83  Temp:   98.1 F (36.7 C)   TempSrc:   Oral   Resp: 29 16 20 14   SpO2: 100% 95% 100% 100%   Blood pressure 140/71, pulse 83, temperature 98.1 F (36.7 C), temperature source Oral, resp. rate 14, SpO2 100 %. PSYCH: She is alert and oriented x 4; does not appear anxious does not appear depressed; affect is normal HEENT: Normocephalic and Atraumatic, Mucous membranes pink; PERRLA; EOM intact; Fundi:  Benign;  No scleral icterus, Nares:  Patent, Oropharynx: Clear, Edentulous,    Neck:  FROM, No Cervical Lymphadenopathy nor Thyromegaly or Carotid Bruit; No JVD; Breasts:: Not examined CHEST WALL: No tenderness CHEST: Normal respiration, clear to auscultation bilaterally HEART: Irregular Irregular rate and rhythm; +Murmurs, No rubs, or gallops BACK: No kyphosis or scoliosis; No CVA tenderness ABDOMEN: Positive Bowel Sounds, Obese, Soft Non-Tender; No Masses, No Organomegaly Rectal Exam: Not done EXTREMITIES: No Cyanosis, Clubbing, or Edema; No Ulcerations. Genitalia: not examined PULSES: 2+ and symmetric SKIN: Normal hydration no rash or ulceration CNS:  Alert and Oriented x 4, No Focal Deficits Vascular: pulses palpable throughout    Labs on Admission:  Basic Metabolic Panel:  Recent Labs Lab 06/22/14 2350  NA 135  K 4.0  CL 104  CO2 22  GLUCOSE 103*  BUN 7  CREATININE 1.03  CALCIUM 8.9   Liver Function Tests: No results for input(s): AST, ALT, ALKPHOS, BILITOT, PROT, ALBUMIN in the last 168 hours. No results for input(s): LIPASE, AMYLASE in the last 168 hours. No results for input(s): AMMONIA in the last 168 hours. CBC:  Recent Labs Lab 06/22/14 2350  WBC 9.5  HGB 10.5*  HCT 33.1*  MCV 91.9  PLT 400   Cardiac Enzymes: No results for input(s): CKTOTAL, CKMB, CKMBINDEX, TROPONINI in the last 168 hours.  BNP (last 3 results) No results for input(s): PROBNP in the last 8760 hours. CBG: No results for input(s): GLUCAP in the last 168 hours.  Radiological Exams on Admission: Dg Chest Port 1 View  06/23/2014   CLINICAL DATA:  Chest pain and shortness of breath.  EXAM: PORTABLE CHEST - 1 VIEW  COMPARISON:  06/10/2014  FINDINGS: Patient is post median sternotomy. Dual lead left-sided pacemaker remains in place. Cardiomegaly is stable. There is blunting of the costophrenic angles that appears similar to prior exam. Mild vascular congestion. No confluent airspace disease. No pneumothorax. No acute osseous  abnormality.  IMPRESSION: Cardiomegaly, mild vascular congestion, and bilateral pleural effusions. These findings are unchanged and likely represent mild congestive failure.   Electronically Signed   By: Jeb Levering M.D.   On: 06/23/2014 00:50  EKG: Independently reviewed. Atrial fibrillation with RVR at rate 137    Assessment/Plan:   81 y.o. female with   Principal Problem:   1.    Atrial fibrillation with RVR   Cardizem Drip   Resume PO Oral Cardizem in AM, and Metoprolol in AM   Full Dose Lovenox ordered   Active Problems:  2.   Atypical chest pain   Recent Cardiac Cath on 06/11/2014 with Negative significant Disease   Non-Cardiac  Pain Vs Pain due to  A.Fib with RVR and the IHSS      3.   IHSS (idiopathic hypertrophic subaortic stenosis)   2D ECHO in AM   May need TEE      4.   Coronary artery disease, non-occlusive by cardiac cath   Stable   On ASA, Metoprolol, and Lisinopril.         5.   Systolic and diastolic CHF, chronic   CHF Protocol and diurese with 20 mg IV Lasix      6.  Gastroesophageal reflux disease without esophagitis   Protonix      7.  Chronic kidney disease (CKD), stage III (moderate)   Monitor BUN/Cr      8.   DVT prophylaxis   Full dose Lovenox ordered      Code Status:   FULL CODE Family Communication:   No Family Present Disposition Plan:     Inpatient Stepdown    Time spent:  [place] [facility] Pager [phone]   If [place] Please Contact the Day Rounding Team MD for [facility]  If 7PM-7AM, Please Contact Night-Floor Coverage  [email or web address] Password [email or web address] 06/23/2014, 3:26 AM

## 2014-06-23 NOTE — Progress Notes (Signed)
Patient ID: Jasmine [name]  female  FOY:[id number]    DOB: 1924-06-28    DOA: 06/22/2014  PCP: [name], MD  Brief history of present illness  Patient is 81 year old female with CAD, CABG, recent cardiac cath on 06/11/14, chronic atrial fibrillation (not on anticoagulation due to recurrent GI bleeds), severe MR s/p pericardial tissue MVR, mild AI and diastolic dysfunction present of the chest pain and palpitations. Patient reported substernal area chest pain, epigastric abdominal pain and palpitations since 6 PM yesterday, pain was relieved after nitroglycerin. In ED she was found to have atrial fibrillation with RVR. She was placed on Cardizem drip, admitted to stepdown unit. Patient was admitted with similar presentation on 06/10/14 with chest pain, troponin was slightly positive. Cardiology was consulted, patient underwent cardiac catheterization. Cath showed mild LV dysfunction, significant mid distal anterolateral hypocontractility, focal apical akinesis, mid to distal septal wall and severe hypo-to akinesis, no significant coronary obstructive disease with minimal luminal irregularities of proximal RCA, 10-20% RCA narrowing otherwise normal LAD and circumflex vessels. EF 45%  Assessment/Plan: Principal Problem:   Atrial fibrillation with RVR - Currently on Cardizem drip, rate controlled, will resume oral Cardizem and metoprolol. Transition the Cardizem drip after oral Cardizem started. -   patient was seen by cardiology during previous admission, had similar presentation, was seen by Dr. Ellyn Hack. Patient has refused anticoagulation in the past and also has history of recurrent GI bleeds, recommended aspirin by cardiology. The bioprosthetic mitral valve, she does not require full anticoagulation. - Follow 2-D echo, currently chest pain resolved - Patient was placed on full dose of Lovenox, will change to heparin subcutaneous and aspirin   Active Problems: Atypical chest pain - Troponin  poc negative. - Recent cardiac cath on 1/5 had shown normal coronary arteries, possibly has demand ischemia in the setting of A. fib with RVR with hypertrophic cardiomyopathy.     history of IHSS (idiopathic hypertrophic subaortic stenosis), severe MR status post pericardial tissue MVR, chronic systolic CHF - Currently stable, monitor I's and O's and daily weights, continue Lasix    Gastroesophageal reflux disease without esophagitis -Continue PPI     Chronic kidney disease (CKD), stage III (moderate)  DVT Prophylaxis: heparin subcutaneous   Code Status: DO NOT RESUSCITATE   Family Communication:  Disposition:  Consultants: None  Procedures  None  Antibiotics  none     Subjective: Patient seen and examined, denies any specific complaints, states chest pain has resolved, still in atrial fibrillation but heart rate now controlled 70-80's during my examination   Objective  Weight change:   Intake/Output Summary (Last 24 hours) at 06/23/14 0920 Last data filed at 06/23/14 0700  Gross per 24 hour  Intake 121.33 ml  Output    150 ml  Net -28.67 ml   Blood pressure 116/50, pulse 72, temperature 97.9 F (36.6 C), temperature source Oral, resp. rate 14, height 5\' 8"  (1.727 m), weight 77.5 kg (170 lb 13.7 oz), SpO2 99 %.  Physical Exam: General: Alert and awake, oriented x3, not in any acute distress. HEENT: anicteric sclera, PERLA, EOMI CVS: Irregularly irregular, 2/6 SEM RSB Chest: clear to auscultation bilaterally, no wheezing, rales or rhonchi Abdomen: soft nontender, nondistended, normal bowel sounds  Extremities: no cyanosis, clubbing or edema noted bilaterally Neuro: Cranial nerves II-XII intact, no focal neurological deficits  Lab Results: Basic Metabolic Panel:  Recent Labs Lab 06/22/14 2350 06/23/14 0640  NA 135 140  K 4.0 3.7  CL 104 108  CO2 22  23  GLUCOSE 103* 97  BUN 7 7  CREATININE 1.03 1.04  CALCIUM 8.9 8.7   Liver Function  Tests:  Recent Labs Lab 06/23/14 0010  AST 23  ALT 14  ALKPHOS 53  BILITOT PENDING  PROT 6.5  ALBUMIN 3.3*    Recent Labs Lab 06/23/14 0010  LIPASE 29   No results for input(s): AMMONIA in the last 168 hours. CBC:  Recent Labs Lab 06/22/14 2350 06/23/14 0640  WBC 9.5 10.2  HGB 10.5* 10.0*  HCT 33.1* 31.3*  MCV 91.9 92.1  PLT 400 373   Cardiac Enzymes: No results for input(s): CKTOTAL, CKMB, CKMBINDEX, TROPONINI in the last 168 hours. BNP: Invalid input(s): POCBNP CBG: No results for input(s): GLUCAP in the last 168 hours.   Micro Results: Recent Results (from the past 240 hour(s))  MRSA PCR Screening     Status: None   Collection Time: 06/23/14  3:45 AM  Result Value Ref Range Status   MRSA by PCR NEGATIVE NEGATIVE Final    Comment:        The GeneXpert MRSA Assay (FDA approved for NASAL specimens only), is one component of a comprehensive MRSA colonization surveillance program. It is not intended to diagnose MRSA infection nor to guide or monitor treatment for MRSA infections.     Studies/Results: Dg Chest Port 1 View  06/23/2014   CLINICAL DATA:  Chest pain and shortness of breath.  EXAM: PORTABLE CHEST - 1 VIEW  COMPARISON:  06/10/2014  FINDINGS: Patient is post median sternotomy. Dual lead left-sided pacemaker remains in place. Cardiomegaly is stable. There is blunting of the costophrenic angles that appears similar to prior exam. Mild vascular congestion. No confluent airspace disease. No pneumothorax. No acute osseous abnormality.  IMPRESSION: Cardiomegaly, mild vascular congestion, and bilateral pleural effusions. These findings are unchanged and likely represent mild congestive failure.   Electronically Signed   By: Jeb Levering M.D.   On: 06/23/2014 00:50   Dg Chest Port 1 View  06/10/2014   CLINICAL DATA:  Shortness of breath and central chest pain for 1 day.  EXAM: PORTABLE CHEST - 1 VIEW  COMPARISON:  07/08/2012.  FINDINGS: Interval mildly  enlarged cardiac silhouette with increased prominence of the pulmonary vasculature and interstitial markings. Small bilateral pleural effusions. Stable median sternotomy wires and left subclavian pacemaker leads. Mild right shoulder degenerative changes.  IMPRESSION: Interval mild cardiomegaly and mild changes of congestive heart failure.   Electronically Signed   By: Enrique Sack M.D.   On: 06/10/2014 20:23    Medications: Scheduled Meds: . aspirin  81 mg Oral Daily  . calcium-vitamin D  1 tablet Oral Daily  . diltiazem  120 mg Oral Q24H  . dorzolamide  1 drop Both Eyes BID  . enoxaparin (LOVENOX) injection  1 mg/kg Subcutaneous Q12H  . ferrous sulfate  325 mg Oral Daily  . fluticasone  1 spray Each Nare Daily  . fluticasone  2 puff Inhalation BID  . furosemide  20 mg Oral BID  . lisinopril  10 mg Oral Daily  . loratadine  10 mg Oral Daily  . LORazepam  1 mg Oral QHS  . memantine  10 mg Oral BID  . metoprolol succinate  50 mg Oral QHS  . pantoprazole  40 mg Oral Daily  . potassium chloride  10 mEq Oral Daily  . pravastatin  20 mg Oral q1800  . sodium chloride  3 mL Intravenous Q12H  . sodium chloride  3 mL Intravenous  Q12H  . vitamin E  400 Units Oral Daily    time spent : 25 minutes    LOS: 1 day   Dameon Soltis M.D. [facility] 06/23/2014, 9:20 AM Pager: [phone]  If 7PM-7AM, please contact night-coverage [email or web address] Password [email or web address]

## 2014-06-24 DIAGNOSIS — R079 Chest pain, unspecified: Secondary | ICD-10-CM

## 2014-06-24 DIAGNOSIS — I369 Nonrheumatic tricuspid valve disorder, unspecified: Secondary | ICD-10-CM

## 2014-06-24 DIAGNOSIS — I482 Chronic atrial fibrillation: Principal | ICD-10-CM

## 2014-06-24 LAB — CBC
HCT: 32.5 % — ABNORMAL LOW (ref 36.0–46.0)
HEMOGLOBIN: 10.4 g/dL — AB (ref 12.0–15.0)
MCH: 29.5 pg (ref 26.0–34.0)
MCHC: 32 g/dL (ref 30.0–36.0)
MCV: 92.3 fL (ref 78.0–100.0)
PLATELETS: 380 10*3/uL (ref 150–400)
RBC: 3.52 MIL/uL — ABNORMAL LOW (ref 3.87–5.11)
RDW: 15.3 % (ref 11.5–15.5)
WBC: 8.5 10*3/uL (ref 4.0–10.5)

## 2014-06-24 LAB — BASIC METABOLIC PANEL
ANION GAP: 7 (ref 5–15)
BUN: 7 mg/dL (ref 6–23)
CALCIUM: 8.9 mg/dL (ref 8.4–10.5)
CHLORIDE: 102 meq/L (ref 96–112)
CO2: 28 mmol/L (ref 19–32)
CREATININE: 1.28 mg/dL — AB (ref 0.50–1.10)
GFR calc non Af Amer: 36 mL/min — ABNORMAL LOW (ref >90)
GFR, EST AFRICAN AMERICAN: 42 mL/min — AB (ref >90)
Glucose, Bld: 106 mg/dL — ABNORMAL HIGH (ref 70–99)
POTASSIUM: 4 mmol/L (ref 3.5–5.1)
Sodium: 137 mmol/L (ref 135–145)

## 2014-06-24 MED ORDER — PANTOPRAZOLE SODIUM 40 MG PO TBEC
40.0000 mg | DELAYED_RELEASE_TABLET | Freq: Two times a day (BID) | ORAL | Status: DC
  Administered 2014-06-24 – 2014-06-26 (×5): 40 mg via ORAL
  Filled 2014-06-24 (×5): qty 1

## 2014-06-24 NOTE — Consult Note (Signed)
CARDIOLOGY CONSULT NOTE   Patient ID: Jasmine [name] MRN: [id number], DOB/AGE: April 22, 1925   Admit date: 06/22/2014 Date of Consult: 06/24/2014   Primary Physician: [name], MD Primary Cardiologist: Dr. Fransico Him  Pt. Profile  81 year old admitted on 06/23/14 with chest pain and palpitations.  Chest pain was relieved after sublingual nitroglycerin.  Problem List  Past Medical History  Diagnosis Date  . GERD (gastroesophageal reflux disease)   . Insomnia   . Eczema   . Glaucoma   . Diastolic dysfunction   . IHSS (idiopathic hypertrophic subaortic stenosis)     s/p septal myomectomy  . High cholesterol   . Coronary artery disease   . Blood transfusion   . Chronic kidney disease (CKD), stage III (moderate)   . Severe mitral regurgitation     s/p MVR with pericardial tissue valve  . Diverticulosis of colon with hemorrhage 08/12/2012  . GI bleed   . Asthma   . Hypertension   . Reflux esophagitis   . Pulmonary HTN     mild to moderate  . Aortic insufficiency   . Diastolic dysfunction   . Bradycardia     s/p PPM  . Atrial fibrillation and flutter     atrial fibrillation s/p DCCV 06-2012-  now in chronic atrial fibrillation off anticoagulation due to GI bleed  . Paroxysmal atrial tachycardia   . PVCs (premature ventricular contractions)   . DCM (dilated cardiomyopathy)      50% echo 06/2012  . Heart murmur     Past Surgical History  Procedure Laterality Date  . Cardiac catheterization    . Cardiac valve replacement  2007    MVR  . Myomectomy  2007    septal  . Tonsillectomy    . Appendectomy    . Cholecystectomy    . Tubal ligation    . Hernia repair      "in my stomach"  . Cataract extraction w/ intraocular lens  implant, bilateral    . Cardioversion  07/04/2012    Procedure: CARDIOVERSION;  Surgeon: Sueanne Margarita, MD;  Location: [place];  Service: Cardiovascular;  Laterality: N/A;  h/p in file drawer   . Colonoscopy    . Colon resection      . Colonoscopy N/A 08/13/2012    Procedure: COLONOSCOPY;  Surgeon: Gatha Mayer, MD;  Location: [place];  Service: Endoscopy;  Laterality: N/A;  . Permanent pacemaker insertion N/A 10/07/2011    Procedure: PERMANENT PACEMAKER INSERTION;  Surgeon: Deboraha Sprang, MD;  Location: [facility] CATH LAB;  Service: Cardiovascular;  Laterality: N/A;  . Left heart catheterization with coronary angiogram N/A 06/11/2014    Procedure: LEFT HEART CATHETERIZATION WITH CORONARY ANGIOGRAM;  Surgeon: Troy Sine, MD;  Location: [facility] CATH LAB;  Service: Cardiovascular;  Laterality: N/A;     Allergies  Allergies  Allergen Reactions  . Cymbalta [Duloxetine Hcl]     Diarrhea, weakness    HPI   This 81 year old woman was readmitted on 06/23/14 because of chest discomfort.  She has a past history of hypertension, permanent atrial fibrillation, not on warfarin or aspirin because of recurrent GI bleeds, chronic left bundle branch block, status post permanent pacemaker insertion by Dr. Caryl Comes for bradycardia, history of Holcomb treated with septal myectomy remotely, and history of previous mitral valve replacement for severe mitral regurgitation with a pericardial tissue mitral valve. The patient was recently hospitalized here on 06/10/14 for chest pain and cardiology was consult at at that time because of  a troponin rising to 8.51.  She was taken to the cardiac catheterization laboratory on 06/11/14 by Dr. Claiborne Billings.  She was found to have an ejection fraction of approximately 45%.  There was Mild LV dysfunction with significant mid distal anterolateral hypocontractility, focal apical akinesis, and mid distal septal wall, severe hypo-to akinesis.  No significant coronary obstructive disease with minimal luminal irregularities of the proximal RCA with 10-20% in RCA narrowing and otherwise appearing normal LAD and circumflex vessels. On this admission she has had very slight elevation of troponin to 0.05.  Her EKG shows chronic  left bundle branch block and is not helpful regarding ischemia.  Her rhythm is atrial fibrillation, chronic, with controlled ventricular response.  Chest x-ray shows cardiomegaly with mild congestive heart failure unchanged from 2 weeks ago Inpatient Medications  . aspirin  81 mg Oral Daily  . calcium-vitamin D  1 tablet Oral Daily  . diltiazem  120 mg Oral Q24H  . dorzolamide  1 drop Both Eyes BID  . ferrous sulfate  325 mg Oral Daily  . fluticasone  1 spray Each Nare Daily  . fluticasone  2 puff Inhalation BID  . furosemide  20 mg Oral BID  . heparin subcutaneous  5,000 Units Subcutaneous Q8H  . loratadine  10 mg Oral Daily  . LORazepam  1 mg Oral QHS  . memantine  10 mg Oral BID  . metoprolol succinate  50 mg Oral QHS  . pantoprazole  40 mg Oral BID AC  . potassium chloride  10 mEq Oral Daily  . pravastatin  20 mg Oral q1800  . sodium chloride  3 mL Intravenous Q12H  . sodium chloride  3 mL Intravenous Q12H  . vitamin E  400 Units Oral Daily    Family History Family History  Problem Relation Age of Onset  . CVA Mother   . Hypertension Mother   . CVA Brother   . Hypertension Brother      Social History History   Social History  . Marital Status: Widowed    Spouse Name: N/A    Number of Children: 3  . Years of Education: N/A   Occupational History  . Not on file.   Social History Main Topics  . Smoking status: Former Smoker    Types: Cigarettes    Quit date: 06/07/1974  . Smokeless tobacco: Former Systems developer    Types: Snuff    Quit date: 04/23/1975     Comment: 10/05/11 "quit smoking & dipping snuff; been some years ago"  . Alcohol Use: No     Comment: 10/05/11 "used to drink; quit a long time ago"  . Drug Use: No  . Sexual Activity: No   Other Topics Concern  . Not on file   Social History Narrative     Review of Systems  General:  No chills, fever, night sweats or weight changes.  Cardiovascular:  No chest pain, dyspnea on exertion, edema, orthopnea,  palpitations, paroxysmal nocturnal dyspnea. Dermatological: No rash, lesions/masses Respiratory: No cough, dyspnea Urologic: No hematuria, dysuria Abdominal:   No nausea, vomiting, diarrhea, bright red blood per rectum, melena, or hematemesis Neurologic:  No visual changes, wkns, changes in mental status. All other systems reviewed and are otherwise negative except as noted above.  Physical Exam  Blood pressure 110/82, pulse 89, temperature 98.2 F (36.8 C), temperature source Oral, resp. rate 18, height 5\' 8"  (1.727 m), weight 169 lb 3.2 oz (76.749 kg), SpO2 100 %.  General: Pleasant, NAD Psych:  Normal affect. Neuro: Alert and oriented X 3. Moves all extremities spontaneously. HEENT: Normal  Neck: Supple without bruits.  Jugular venous pressure is elevated with prominent V-wave Lungs:  Resp regular and unlabored, CTA. Heart: The pulse is irregularly irregular.  There is a soft systolic ejection murmur at the base.  There is a pacemaker in the left upper chest Abdomen: Soft, non-tender, non-distended, BS + x 4.  There is a firm, mildly tender resolving hematoma below the right inguinal catheter site.  It is not pulsatile.  There is no systolic bruit. Extremities: No clubbing, cyanosis or edema. DP/PT/Radials 2+ and equal bilaterally.  Labs   Recent Labs  06/23/14 0953 06/23/14 1508 06/23/14 2102  TROPONINI 0.06* 0.05* 0.05*   Lab Results  Component Value Date   WBC 8.5 06/24/2014   HGB 10.4* 06/24/2014   HCT 32.5* 06/24/2014   MCV 92.3 06/24/2014   PLT 380 06/24/2014    Recent Labs Lab 06/23/14 0010  06/24/14 0255  NA  --   < > 137  K  --   < > 4.0  CL  --   < > 102  CO2  --   < > 28  BUN  --   < > 7  CREATININE  --   < > 1.28*  CALCIUM  --   < > 8.9  PROT 6.5  --   --   BILITOT 0.9  --   --   ALKPHOS 53  --   --   ALT 14  --   --   AST 23  --   --   GLUCOSE  --   < > 106*  < > = values in this interval not displayed. Lab Results  Component Value Date    CHOL 138 06/12/2014   HDL 45 06/12/2014   LDLCALC 78 06/12/2014   TRIG 75 06/12/2014   Lab Results  Component Value Date   DDIMER <0.22 12/22/2010    Radiology/Studies  Dg Chest Port 1 View  06/23/2014   CLINICAL DATA:  Chest pain and shortness of breath.  EXAM: PORTABLE CHEST - 1 VIEW  COMPARISON:  06/10/2014  FINDINGS: Patient is post median sternotomy. Dual lead left-sided pacemaker remains in place. Cardiomegaly is stable. There is blunting of the costophrenic angles that appears similar to prior exam. Mild vascular congestion. No confluent airspace disease. No pneumothorax. No acute osseous abnormality.  IMPRESSION: Cardiomegaly, mild vascular congestion, and bilateral pleural effusions. These findings are unchanged and likely represent mild congestive failure.   Electronically Signed   By: Jeb Levering M.D.   On: 06/23/2014 00:50   Dg Chest Port 1 View  06/10/2014   CLINICAL DATA:  Shortness of breath and central chest pain for 1 day.  EXAM: PORTABLE CHEST - 1 VIEW  COMPARISON:  07/08/2012.  FINDINGS: Interval mildly enlarged cardiac silhouette with increased prominence of the pulmonary vasculature and interstitial markings. Small bilateral pleural effusions. Stable median sternotomy wires and left subclavian pacemaker leads. Mild right shoulder degenerative changes.  IMPRESSION: Interval mild cardiomegaly and mild changes of congestive heart failure.   Electronically Signed   By: Enrique Sack M.D.   On: 06/10/2014 20:23    ECG  22-Jun-2014 23:45:49 [facility]/ED ROUTINE RECORD Atrial fibrillation Left bundle branch block Confirmed by YELVERTON MD, DAVID ([id number]) on 06/22/2014 11:51:21 PM Personally reviewed.  ASSESSMENT AND PLAN  1.  Chronic atrial fibrillation, not on warfarin because of past history of GI bleeding 2.  Slight elevation of troponin this admission consistent with atrial fibrillation with rapid ventricular rate on admission. 3.  Chest pain  relieved with nitroglycerin.  Recent catheter did not show any significant obstructive lesions. 4.  History of HOCM, previously treated with septal myomectomy. 5.  Bioprosthetic mitral valve 6.  Chronic kidney disease stage III 7.  Post-catheter mild right groin hematoma.  Localized ultrasound pending.  Recommendation agree with plans for 2-D echocardiogram to update evaluation of her HOCM and bioprosthetic mitral valve.  No further invasive cardiac procedures anticipated.  Depending on degree of left ventricular outflow tract obstruction which remains, we may decide to add low dose isosorbide mononitrate at discharge for prevention of coronary spasm.  This could be used if her gradient is not severe. Will follow with you.    Signed, Darlin Coco, MD  06/24/2014, 10:08 AM

## 2014-06-24 NOTE — Progress Notes (Signed)
Patient ID: Jasmine [name]  female  VQQ:[id number]    DOB: 12-01-1924    DOA: 06/22/2014  PCP: [name], MD  Brief history of present illness  Patient is 81 year old female with CAD, CABG, recent cardiac cath on 06/11/14, chronic atrial fibrillation (not on anticoagulation due to recurrent GI bleeds), severe MR s/p pericardial tissue MVR, mild AI and diastolic dysfunction present of the chest pain and palpitations. Patient reported substernal area chest pain, epigastric abdominal pain and palpitations since 6 PM yesterday, pain was relieved after nitroglycerin. In ED she was found to have atrial fibrillation with RVR. She was placed on Cardizem drip, admitted to stepdown unit. Patient was admitted with similar presentation on 06/10/14 with chest pain, troponin was slightly positive. Cardiology was consulted, patient underwent cardiac catheterization. Cath showed mild LV dysfunction, significant mid distal anterolateral hypocontractility, focal apical akinesis, mid to distal septal wall and severe hypo-to akinesis, no significant coronary obstructive disease with minimal luminal irregularities of proximal RCA, 10-20% RCA narrowing otherwise normal LAD and circumflex vessels. EF 45%  Assessment/Plan: Principal Problem:   Atrial fibrillation with RVR -Currently off the Cardizem drip, presumed oral Cardizem and metoprolol.  -  patient was seen by cardiology during previous admission, had similar presentation, was seen by Dr. Ellyn Hack. Patient has refused anticoagulation in the past and also has history of recurrent GI bleeds, recommended aspirin by cardiology. The bioprosthetic mitral valve, she does not require full anticoagulation. - 2-D echo pending, patient still complaining of off-and-on chest pain, cardiology consulted. Troponin slightly positive.  - Patient was placed on full dose of Lovenox, will change to heparin subcutaneous and aspirin  Active Problems: Right groin hematoma - Patient  still has right groin hematoma at the site of cardiac, obtain localized ultrasound  Atypical chest pain - Troponins positive, 0.06->0.05>-0.05,still complaining of intermittent chest pain, 2-D echo pending   - Recent cardiac cath on 1/5 had shown normal coronary arteries, possibly has demand ischemia in the setting of A. fib with RVR with hypertrophic cardiomyopathy. - Also placed on PPI     history of IHSS (idiopathic hypertrophic subaortic stenosis), severe MR status post pericardial tissue MVR, chronic systolic CHF - Currently stable, monitor I's and O's and daily weights, continue Lasix    Gastroesophageal reflux disease with esophagitis -Continue PPI,  increased to twice a day     Chronic kidney disease (CKD), stage III (moderate) - Creatinine function slightly worsened to 1.2 today, monitor closely, patient was in atrial fibrillation with RVR which could decrease renal perfusion   DVT Prophylaxis: heparin subcutaneous   Code Status: DO NOT RESUSCITATE   Family Communication:  Disposition:Transfer to telemetry floor   Consultants: None  Procedures  None  Antibiotics  none     Subjective: Patient seen and examined, still having some intermittent chest pain, also has GERD, Cardizem drip off, heart rate stable, BP stable   Objective  Weight change: -0.751 kg (-1 lb 10.5 oz)  Intake/Output Summary (Last 24 hours) at 06/24/14 0715 Last data filed at 06/24/14 0700  Gross per 24 hour  Intake    600 ml  Output    680 ml  Net    -80 ml   Blood pressure 88/31, pulse 78, temperature 98.3 F (36.8 C), temperature source Oral, resp. rate 21, height 5\' 8"  (1.727 m), weight 76.749 kg (169 lb 3.2 oz), SpO2 99 %.  Physical Exam: General: Alert and awake, oriented x3, not in any acute distress. CVS: Irregularly irregular, 2/6 SEM  RSB Chest: clear to auscultation bilaterally, no wheezing, rales or rhonchi Abdomen: soft nontender, nondistended, normal bowel sounds    Extremities: no cyanosis, clubbing or edema noted bilaterally   Lab Results: Basic Metabolic Panel:  Recent Labs Lab 06/23/14 0640 06/24/14 0255  NA 140 137  K 3.7 4.0  CL 108 102  CO2 23 28  GLUCOSE 97 106*  BUN 7 7  CREATININE 1.04 1.28*  CALCIUM 8.7 8.9   Liver Function Tests:  Recent Labs Lab 06/23/14 0010  AST 23  ALT 14  ALKPHOS 53  BILITOT 0.9  PROT 6.5  ALBUMIN 3.3*    Recent Labs Lab 06/23/14 0010  LIPASE 29   No results for input(s): AMMONIA in the last 168 hours. CBC:  Recent Labs Lab 06/22/14 2350 06/23/14 0640  WBC 9.5 10.2  HGB 10.5* 10.0*  HCT 33.1* 31.3*  MCV 91.9 92.1  PLT 400 373   Cardiac Enzymes:  Recent Labs Lab 06/23/14 0953 06/23/14 1508 06/23/14 2102  TROPONINI 0.06* 0.05* 0.05*   BNP: Invalid input(s): POCBNP CBG: No results for input(s): GLUCAP in the last 168 hours.   Micro Results: Recent Results (from the past 240 hour(s))  MRSA PCR Screening     Status: None   Collection Time: 06/23/14  3:45 AM  Result Value Ref Range Status   MRSA by PCR NEGATIVE NEGATIVE Final    Comment:        The GeneXpert MRSA Assay (FDA approved for NASAL specimens only), is one component of a comprehensive MRSA colonization surveillance program. It is not intended to diagnose MRSA infection nor to guide or monitor treatment for MRSA infections.     Studies/Results: Dg Chest Port 1 View  06/23/2014   CLINICAL DATA:  Chest pain and shortness of breath.  EXAM: PORTABLE CHEST - 1 VIEW  COMPARISON:  06/10/2014  FINDINGS: Patient is post median sternotomy. Dual lead left-sided pacemaker remains in place. Cardiomegaly is stable. There is blunting of the costophrenic angles that appears similar to prior exam. Mild vascular congestion. No confluent airspace disease. No pneumothorax. No acute osseous abnormality.  IMPRESSION: Cardiomegaly, mild vascular congestion, and bilateral pleural effusions. These findings are unchanged and  likely represent mild congestive failure.   Electronically Signed   By: Jeb Levering M.D.   On: 06/23/2014 00:50   Dg Chest Port 1 View  06/10/2014   CLINICAL DATA:  Shortness of breath and central chest pain for 1 day.  EXAM: PORTABLE CHEST - 1 VIEW  COMPARISON:  07/08/2012.  FINDINGS: Interval mildly enlarged cardiac silhouette with increased prominence of the pulmonary vasculature and interstitial markings. Small bilateral pleural effusions. Stable median sternotomy wires and left subclavian pacemaker leads. Mild right shoulder degenerative changes.  IMPRESSION: Interval mild cardiomegaly and mild changes of congestive heart failure.   Electronically Signed   By: Enrique Sack M.D.   On: 06/10/2014 20:23    Medications: Scheduled Meds: . aspirin  81 mg Oral Daily  . calcium-vitamin D  1 tablet Oral Daily  . diltiazem  120 mg Oral Q24H  . dorzolamide  1 drop Both Eyes BID  . ferrous sulfate  325 mg Oral Daily  . fluticasone  1 spray Each Nare Daily  . fluticasone  2 puff Inhalation BID  . furosemide  20 mg Oral BID  . heparin subcutaneous  5,000 Units Subcutaneous Q8H  . lisinopril  10 mg Oral Daily  . loratadine  10 mg Oral Daily  . LORazepam  1 mg  Oral QHS  . memantine  10 mg Oral BID  . metoprolol succinate  50 mg Oral QHS  . pantoprazole  40 mg Oral BID AC  . potassium chloride  10 mEq Oral Daily  . pravastatin  20 mg Oral q1800  . sodium chloride  3 mL Intravenous Q12H  . sodium chloride  3 mL Intravenous Q12H  . vitamin E  400 Units Oral Daily    time spent : 25 minutes    LOS: 2 days   Klever Twyford M.D. [facility] 06/24/2014, 7:15 AM Pager: [phone]  If 7PM-7AM, please contact night-coverage [email or web address] Password [email or web address]

## 2014-06-24 NOTE — Progress Notes (Signed)
[facility]  Patient Status: Active (receiving services up to time of hospitalization)  [facility] is providing the following services: RN and PT  If patient discharges after hours, please call [phone].   Jasmine [name] 06/24/2014, 11:32 AM

## 2014-06-24 NOTE — Progress Notes (Signed)
Echocardiogram 2D Echocardiogram has been performed.  Joelene Millin 06/24/2014, 8:39 AM

## 2014-06-25 ENCOUNTER — Encounter (HOSPITAL_COMMUNITY): Payer: Self-pay | Admitting: Physician Assistant

## 2014-06-25 DIAGNOSIS — R0789 Other chest pain: Secondary | ICD-10-CM

## 2014-06-25 LAB — BASIC METABOLIC PANEL
Anion gap: 9 (ref 5–15)
BUN: 9 mg/dL (ref 6–23)
CALCIUM: 8.8 mg/dL (ref 8.4–10.5)
CO2: 27 mmol/L (ref 19–32)
Chloride: 103 mEq/L (ref 96–112)
Creatinine, Ser: 1.27 mg/dL — ABNORMAL HIGH (ref 0.50–1.10)
GFR calc Af Amer: 43 mL/min — ABNORMAL LOW (ref >90)
GFR calc non Af Amer: 37 mL/min — ABNORMAL LOW (ref >90)
GLUCOSE: 94 mg/dL (ref 70–99)
Potassium: 3.6 mmol/L (ref 3.5–5.1)
Sodium: 139 mmol/L (ref 135–145)

## 2014-06-25 MED ORDER — METHYLPREDNISOLONE 4 MG PO KIT
4.0000 mg | PACK | ORAL | Status: AC
  Administered 2014-06-25: 4 mg via ORAL

## 2014-06-25 MED ORDER — METHYLPREDNISOLONE 4 MG PO KIT
8.0000 mg | PACK | Freq: Every evening | ORAL | Status: AC
  Administered 2014-06-25: 8 mg via ORAL

## 2014-06-25 MED ORDER — METHYLPREDNISOLONE 4 MG PO KIT
4.0000 mg | PACK | Freq: Three times a day (TID) | ORAL | Status: DC
  Administered 2014-06-26 (×2): 4 mg via ORAL

## 2014-06-25 MED ORDER — METHYLPREDNISOLONE 4 MG PO KIT: 8.0000 mg | PACK | Freq: Every evening | ORAL | Status: DC

## 2014-06-25 MED ORDER — METHYLPREDNISOLONE 4 MG PO KIT
8.0000 mg | PACK | Freq: Every morning | ORAL | Status: AC
  Administered 2014-06-25: 8 mg via ORAL
  Filled 2014-06-25: qty 21

## 2014-06-25 MED ORDER — ISOSORBIDE MONONITRATE 15 MG HALF TABLET
15.0000 mg | ORAL_TABLET | Freq: Every day | ORAL | Status: DC
  Administered 2014-06-25 – 2014-06-26 (×2): 15 mg via ORAL
  Filled 2014-06-25 (×2): qty 1

## 2014-06-25 MED ORDER — METHYLPREDNISOLONE 4 MG PO KIT: 4.0000 mg | PACK | Freq: Four times a day (QID) | ORAL | Status: DC

## 2014-06-25 NOTE — Evaluation (Signed)
Occupational Therapy Evaluation Patient Details Name: Jasmine [name] MRN: [id number] DOB: 12-17-1924 Today's Date: 06/25/2014    History of Present Illness Jasmine [name] is a 81 y.o. female with a history of CAD S/P CABG and recent PCI on 06/11/2014 revealing NonObstructive Disease findings, Hx of IHSS, Atrial Fibrillation, Diastolic CHF , Pulmonary HTN who presents to the ED with complaints of substernal area chest pain and Epigastric ABD Pain and palpitations .    Clinical Impression   Pt admitted with above. Pt independent with ADLs, PTA. Feel pt will benefit from acute OT to increase strength prior to d/c.     Follow Up Recommendations  No OT follow up;Supervision - Intermittent    Equipment Recommendations  3 in 1 bedside comode    Recommendations for Other Services       Precautions / Restrictions Precautions Precautions: None Restrictions Weight Bearing Restrictions: No      Mobility Bed Mobility Overal bed mobility: Modified Independent                Transfers Overall transfer level: Modified independent                         ADL Overall ADL's : Needs assistance/impaired                     Lower Body Dressing: Supervision/safety; Setup; Sit to/from stand   Toilet Transfer: Supervision/safety;Ambulation (bed)       Tub/ Shower Transfer: Supervision/safety;Ambulation;Tub transfer   Functional mobility during ADLs: Supervision/safety (ambulated in hallway) General ADL Comments: Educated on safety such as safe shoewear, sitting for most of LB ADLs, recommended someone being with her for tub transfer. Educated on safe tub transfer technique and pt practiced simulated transfer. Pt feels her balance is off compared to baseline. Educated on 3 in 1 and discussed using as shower chair.     Vision                     Perception     Praxis      Pertinent Vitals/Pain Pain Assessment: 0-10 Pain Score: 5  Pain Location:  chest Pain Intervention(s): Monitored during session;Limited activity within patient's tolerance     Hand Dominance     Extremity/Trunk Assessment Upper Extremity Assessment Upper Extremity Assessment: Generalized weakness   Lower Extremity Assessment Lower Extremity Assessment: Defer to PT evaluation   Cervical / Trunk Assessment Cervical / Trunk Assessment: Normal   Communication Communication Communication: No difficulties   Cognition Arousal/Alertness: Awake/alert Behavior During Therapy: WFL for tasks assessed/performed Overall Cognitive Status: Within Functional Limits for tasks assessed                     General Comments       Exercises Exercises: Other exercises Other Exercises Other Exercises: performed theraband exercises with level 1 in sitting and supine; encouraged pt to be moving arms   Shoulder Instructions      Home Living Family/patient expects to be discharged to:: Private residence Living Arrangements: Children Available Help at Discharge: Family Type of Home: House Home Access: Stairs to enter Technical brewer of Steps: 4 Entrance Stairs-Rails: Right Home Layout: One level     Bathroom Shower/Tub: Tub/shower unit;Curtain Shower/tub characteristics: Architectural technologist: Standard     Home Equipment: Shower seat          Prior Functioning/Environment Level of Independence: Independent  Comments: Pt does not drive, but still assists with cooking.  denies h/o falls     OT Diagnosis: Generalized weakness;Acute pain   OT Problem List: Decreased strength;Pain;Decreased activity tolerance;Impaired balance (sitting and/or standing);Decreased knowledge of use of DME or AE   OT Treatment/Interventions: Self-care/ADL training;Therapeutic exercise;DME and/or AE instruction;Therapeutic activities;Balance training;Patient/family education    OT Goals(Current goals can be found in the care plan section) Acute Rehab OT  Goals Patient Stated Goal: getting out of house and going to church OT Goal Formulation: With patient Time For Goal Achievement: 07/02/14 Potential to Achieve Goals: Good ADL Goals Pt Will Transfer to Toilet: with modified independence;ambulating Additional ADL Goal #1: Pt will independently perform HEP for bilateral UE's to increase strength.  OT Frequency: Min 2X/week   Barriers to D/C:            Co-evaluation              End of Session Equipment Utilized During Treatment: Gait belt  Activity Tolerance: Patient limited by fatigue;Patient limited by pain Patient left: in bed;with call bell/phone within reach   Time: 7846-9629 OT Time Calculation (min): 22 min Charges:  OT General Charges $OT Visit: 1 Procedure OT Evaluation $Initial OT Evaluation Tier I: 1 Procedure OT Treatments $Therapeutic Activity: 8-22 mins G-CodesBenito Mccreedy OTR/L [phone] 06/25/2014, 5:45 PM

## 2014-06-25 NOTE — Progress Notes (Signed)
Per Ysidro Evert, Pharmacist, ok to take loading, first dose of newly ordered Medrol DosePak 16mg  PO (= 4 tabs) at one time.  Future administration will be 8mg  at bedtime, then will be on a schedule during the day, to be taken around mealtimes ( 4mg  at 0800, 4mg  at 1300, 4mg  at 1800, and again at 8mg  at bedtime).

## 2014-06-25 NOTE — Care Management Note (Signed)
    Page 1 of 1   06/25/2014     3:25:00 PM CARE MANAGEMENT NOTE 06/25/2014  Patient:  Jasmine [name], Jasmine [name]   Account Number:  [id number]  Date Initiated:  06/24/2014  Documentation initiated by:  Elissa Hefty  Subjective/Objective Assessment:   adm w at fib w rvr     Action/Plan:   lives w fam , pcp dr Butch Penny [place]   Anticipated DC Date:  06/27/2014   Anticipated DC Plan:  [place]         [facility] Choice  Resumption Of Svcs/PTA Provider   Choice offered to / List presented to:  NA        HH arranged  HH-1 RN  [place].   Status of service:  Completed, signed off Medicare Important Message given?  YES (If response is "NO", the following Medicare IM given date fields will be blank) Date Medicare IM given:  06/25/2014 Medicare IM given by:  Treysean Petruzzi Date Additional Medicare IM given:   Additional Medicare IM given by:    Discharge Disposition:  [place]  Per UR Regulation:  Reviewed for med. necessity/level of care/duration of stay  If discussed at [place] of Stay Meetings, dates discussed:    Comments:  Dacari Beckstrand RN, BSN, MSHL, CCM  Nurse - Case Manager,  ([facility] [place]) [phone]  06/25/2014 Patient received to Echo services PT recs:  [place]:  Home with HHS:  RN, PT - resume   ([place] / Butch Penny notified)

## 2014-06-25 NOTE — Progress Notes (Signed)
Patient: Jasmine [name] / Admit Date: 06/22/2014 / Date of Encounter: 06/25/2014, 12:21 PM   Subjective: C/o abdominal discomfort from lower abdomen all the way to epigastrum. Sometimes it goes up to her throat and she feels like she needs to spit up but it doesn't help. Maalox and Zofran did give some relief.   Objective: Telemetry: AF, controlled VR Physical Exam: Blood pressure 124/68, pulse 72, temperature 98.4 F (36.9 C), temperature source Oral, resp. rate 18, height 5\' 8"  (1.727 m), weight 163 lb 8.7 oz (74.182 kg), SpO2 100 %. General: Well developed, well nourished AAF in no acute distress. Head: Normocephalic, atraumatic, sclera non-icteric, no xanthomas, nares are without discharge. Neck: JVP not elevated. Lungs: Clear bilaterally to auscultation without wheezes, rales, or rhonchi. Breathing is unlabored. Heart: Irregularly irregular S1 S2 without murmurs, rubs, or gallops. Controlled rate. Abdomen: Soft, non-tender, non-distended with normoactive bowel sounds. No rebound. Prior surgical scars noted. No flank/back/abdomen ecchymosis. Extremities: No clubbing or cyanosis. No edema.  Neuro: Alert and oriented X 3. Moves all extremities spontaneously. Psych:  Responds to questions appropriately with a normal affect.   Intake/Output Summary (Last 24 hours) at 06/25/14 1221 Last data filed at 06/25/14 1215  Gross per 24 hour  Intake    570 ml  Output   1950 ml  Net  -1380 ml    Inpatient Medications:  . aspirin  81 mg Oral Daily  . calcium-vitamin D  1 tablet Oral Daily  . diltiazem  120 mg Oral Q24H  . dorzolamide  1 drop Both Eyes BID  . ferrous sulfate  325 mg Oral Daily  . fluticasone  1 spray Each Nare Daily  . fluticasone  2 puff Inhalation BID  . heparin subcutaneous  5,000 Units Subcutaneous Q8H  . loratadine  10 mg Oral Daily  . LORazepam  1 mg Oral QHS  . memantine  10 mg Oral BID  . metoprolol succinate  50 mg Oral QHS  . pantoprazole  40 mg Oral BID AC   . potassium chloride  10 mEq Oral Daily  . pravastatin  20 mg Oral q1800  . sodium chloride  3 mL Intravenous Q12H  . sodium chloride  3 mL Intravenous Q12H  . vitamin E  400 Units Oral Daily   Infusions:    Labs:  Recent Labs  06/24/14 0255 06/25/14 0311  NA 137 139  K 4.0 3.6  CL 102 103  CO2 28 27  GLUCOSE 106* 94  BUN 7 9  CREATININE 1.28* 1.27*  CALCIUM 8.9 8.8    Recent Labs  06/23/14 0010  AST 23  ALT 14  ALKPHOS 53  BILITOT 0.9  PROT 6.5  ALBUMIN 3.3*    Recent Labs  06/23/14 0640 06/24/14 0813  WBC 10.2 8.5  HGB 10.0* 10.4*  HCT 31.3* 32.5*  MCV 92.1 92.3  PLT 373 380    Recent Labs  06/23/14 0953 06/23/14 1508 06/23/14 2102  TROPONINI 0.06* 0.05* 0.05*   Invalid input(s): POCBNP No results for input(s): HGBA1C in the last 72 hours.   Radiology/Studies:  Dg Chest Port 1 View  06/23/2014   CLINICAL DATA:  Chest pain and shortness of breath.  EXAM: PORTABLE CHEST - 1 VIEW  COMPARISON:  06/10/2014  FINDINGS: Patient is post median sternotomy. Dual lead left-sided pacemaker remains in place. Cardiomegaly is stable. There is blunting of the costophrenic angles that appears similar to prior exam. Mild vascular congestion. No confluent airspace disease. No pneumothorax. No acute  osseous abnormality.  IMPRESSION: Cardiomegaly, mild vascular congestion, and bilateral pleural effusions. These findings are unchanged and likely represent mild congestive failure.   Electronically Signed   By: Jeb Levering M.D.   On: 06/23/2014 00:50   Dg Chest Port 1 View  06/10/2014   CLINICAL DATA:  Shortness of breath and central chest pain for 1 day.  EXAM: PORTABLE CHEST - 1 VIEW  COMPARISON:  07/08/2012.  FINDINGS: Interval mildly enlarged cardiac silhouette with increased prominence of the pulmonary vasculature and interstitial markings. Small bilateral pleural effusions. Stable median sternotomy wires and left subclavian pacemaker leads. Mild right shoulder  degenerative changes.  IMPRESSION: Interval mild cardiomegaly and mild changes of congestive heart failure.   Electronically Signed   By: Enrique Sack M.D.   On: 06/10/2014 20:23     Assessment and Plan  1. Epigastric abdominal pain - LFTs unrevealing on admission  - on BID PPI - consider GI evaluation    2. Recurrent chest discomfort - recent NSTEMI earlier this month with troponin 8.5, cath with minimal luminal irregularities, 10-20% RCA (in setting of AF RVR) - troponins this admission flat at 0.06-0.05-0.05 likely due to AF RVR vs residual from last admission - per prior note suggestion will add Imdur 15mg  daily in the event there is a component of coronary spasm  3. Chronic AF with elevated HR on admission - ? Elevated HR due to abdominal discomfort on admission - HR improved - not on warfarin because of past history of GI bleeding - h/o bradycardia s/p St. Jude pacemaker in place  4. H/o HOCM s/p remote septal myomectomy - echo 06/24/14 - moderate LVH, mild focal basal septal hypertrophy, EF 50-55%, mild HK anteroseptal MK, mild AI, mild AS, mod TR, PA pressure 67mmHg  - no significant residual outflow tract obstruction  5. Mild anemia  - decreased from earlier this month but stable compared to 2014  - f/u PCP for continued monitoring  6. Post catheterization mild right groin hematoma - [place] pending  7. Bioprosthetic mitral valve 2007 8. AKI on CKD stage III, maybe due to decreased renal perfusion in setting of #2 9. Pulm HTN  Signed, Alexandra Lipps PA-C

## 2014-06-25 NOTE — Consult Note (Signed)
Reason for Consult: Epigastric pain and chest pain Referring Physician: [facility]  Danaysia Mellody Life HPI: This is an 81 year old female with a PMH of HOCM, s/p septal myectomy, atrial fibrillation with RVR, HTN, LBBB, s/p pacemaker for bradycardia, biprosthetic valves, Pulmonary HTN, GI bleed, and GERD s/p Nissen Fundoplication admitted with complaints of chest pain and epigastric pain.  Her symptoms started acutely on 06/23/2014 at 6 PM and she presented to the ER.  She was found to be in RVR with her Afib and subsequently she was rate controlled.  Unfortunately this did not control her chest/abdominal discomfort.  An echo was performed and it was negative for any outflow obstruction.  In fact, she had a cardiac catherization on 06/10/2014 with normal coronary arteries and an EF of 45%.  As a result of her ongoing pain, which appears to be noncardiac, GI was consulted.  In 2011 Dr. Collene Mares performed an EGD for similar complaints with findings of an intact Nissen Fundoplication.  Currently she reports having an eruption of her symptoms starting in the mid abdomen and then it radiates upwards with resultant emesis of saliva.  She can tolerate PO without any difficulty, but she does report issues with dysphagia.  This dysphagia issue is not new.  The patient believes her chest pain stimulates the start her of mid abdominal discomfort.  Past Medical History  Diagnosis Date  . GERD (gastroesophageal reflux disease)   . Insomnia   . Eczema   . Glaucoma   . Diastolic dysfunction   . IHSS (idiopathic hypertrophic subaortic stenosis)     s/p septal myomectomy  . High cholesterol   . Coronary artery disease   . Blood transfusion   . Chronic kidney disease (CKD), stage III (moderate)   . Severe mitral regurgitation     s/p MVR with pericardial tissue valve  . Diverticulosis of colon with hemorrhage 08/12/2012  . GI bleed   . Asthma   . Hypertension   . Reflux esophagitis   . Pulmonary HTN     mild to  moderate  . Aortic insufficiency   . Bradycardia     s/p PPM  . Atrial fibrillation and flutter     atrial fibrillation s/p DCCV 06-2012-  now in chronic atrial fibrillation off anticoagulation due to GI bleed  . Paroxysmal atrial tachycardia   . PVCs (premature ventricular contractions)   . DCM (dilated cardiomyopathy)      50% echo 06/2012  . Heart murmur   . Dementia     Past Surgical History  Procedure Laterality Date  . Cardiac catheterization    . Cardiac valve replacement  2007    MVR  . Myomectomy  2007    septal  . Tonsillectomy    . Appendectomy    . Cholecystectomy    . Tubal ligation    . Hernia repair      "in my stomach"  . Cataract extraction w/ intraocular lens  implant, bilateral    . Cardioversion  07/04/2012    Procedure: CARDIOVERSION;  Surgeon: Sueanne Margarita, MD;  Location: [facility];  Service: Cardiovascular;  Laterality: N/A;  h/p in file drawer   . Colonoscopy    . Colon resection    . Colonoscopy N/A 08/13/2012    Procedure: COLONOSCOPY;  Surgeon: Gatha Mayer, MD;  Location: [facility];  Service: Endoscopy;  Laterality: N/A;  . Permanent pacemaker insertion N/A 10/07/2011    Procedure: PERMANENT PACEMAKER INSERTION;  Surgeon: Revonda Standard  Caryl Comes, MD;  Location[facility] CATH LAB;  Service: Cardiovascular;  Laterality: N/A;  . Left heart catheterization with coronary angiogram N/A 06/11/2014    Procedure: LEFT HEART CATHETERIZATION WITH CORONARY ANGIOGRAM;  Surgeon: Troy Sine, MD;  Location: [facility] CATH LAB;  Service: Cardiovascular;  Laterality: N/A;    Family History  Problem Relation Age of Onset  . CVA Mother   . Hypertension Mother   . CVA Brother   . Hypertension Brother     Social History:  reports that she quit smoking about 40 years ago. Her smoking use included Cigarettes. She quit smokeless tobacco use about 39 years ago. Her smokeless tobacco use included Snuff. She reports that she does not drink alcohol or use illicit drugs.  Allergies:   Allergies  Allergen Reactions  . Cymbalta [Duloxetine Hcl]     Diarrhea, weakness    Medications:  Scheduled: . aspirin  81 mg Oral Daily  . calcium-vitamin D  1 tablet Oral Daily  . diltiazem  120 mg Oral Q24H  . dorzolamide  1 drop Both Eyes BID  . ferrous sulfate  325 mg Oral Daily  . fluticasone  1 spray Each Nare Daily  . fluticasone  2 puff Inhalation BID  . heparin subcutaneous  5,000 Units Subcutaneous Q8H  . isosorbide mononitrate  15 mg Oral Daily  . loratadine  10 mg Oral Daily  . LORazepam  1 mg Oral QHS  . memantine  10 mg Oral BID  . metoprolol succinate  50 mg Oral QHS  . pantoprazole  40 mg Oral BID AC  . potassium chloride  10 mEq Oral Daily  . pravastatin  20 mg Oral q1800  . sodium chloride  3 mL Intravenous Q12H  . sodium chloride  3 mL Intravenous Q12H  . vitamin E  400 Units Oral Daily   Continuous:   Results for orders placed or performed during the hospital encounter of 06/22/14 (from the past 24 hour(s))  Basic metabolic panel     Status: Abnormal   Collection Time: 06/25/14  3:11 AM  Result Value Ref Range   Sodium 139 135 - 145 mmol/L   Potassium 3.6 3.5 - 5.1 mmol/L   Chloride 103 96 - 112 mEq/L   CO2 27 19 - 32 mmol/L   Glucose, Bld 94 70 - 99 mg/dL   BUN 9 6 - 23 mg/dL   Creatinine, Ser 1.27 (H) 0.50 - 1.10 mg/dL   Calcium 8.8 8.4 - 10.5 mg/dL   GFR calc non Af Amer 37 (L) >90 mL/min   GFR calc Af Amer 43 (L) >90 mL/min   Anion gap 9 5 - 15     No results found.  ROS:  As stated above in the HPI otherwise negative.  Blood pressure 113/44, pulse 81, temperature 97.7 F (36.5 C), temperature source Oral, resp. rate 18, height 5\' 8"  (1.727 m), weight 74.182 kg (163 lb 8.7 oz), SpO2 100 %.    PE: Gen: NAD, Alert and Oriented HEENT:  [place]/AT, EOMI Neck: Supple, no LAD Lungs: CTA Bilaterally CV: RRR without M/G/R ABM: Soft, NTND, +BS Ext: No C/C/E  Assessment/Plan: 1) Epigastric pain. 2) Atypical chest pain. 3) HOCM. 4)  Dysphagia. 5) GERD.   I cannot completely discern the source of her pain, but with further questioning and careful palpation, I was able to reproduce her chest pain.  Her chest pain appears to be chest wall pain, i.e., musculoskeletal.  Per her report the pain is the  stimulus for her mid abdominal pain that precipitates an upward movement and saliva emesis.  I think it will be worthwhile to give her a try of a Medrol Dose Pack and see if this steroid treatment will help to improve the chest wall pain and halt any further emesis response.  If this fails, a manometry/impedence pH can be considered for the upper GI symptoms.  Plan: 1) Medrol dose pack x 6 days. 2) Follow up with Dr. Collene Mares in 2-4 weeks. 3) Signing off.  Chessie Neuharth D 06/25/2014, 4:09 PM

## 2014-06-25 NOTE — Progress Notes (Signed)
VASCULAR LAB PRELIMINARY  PRELIMINARY  PRELIMINARY  PRELIMINARY  Right groin ultrasound completed.    Preliminary report:  No evidence of active pseudoaneurysm.  Three areas of hypoechogenicity in the groin may be hematoma.  Dejia Ebron, RVT 06/25/2014, 2:45 PM

## 2014-06-25 NOTE — Progress Notes (Signed)
Received order to discontinue telemetry.  Notified CCMD, and telemetry monitor was removed from pt.

## 2014-06-25 NOTE — Evaluation (Signed)
Physical Therapy Evaluation Patient Details Name: Jasmine [name] MRN: [id number] DOB: [birth year] Today's Date: 06/25/2014   History of Present Illness  Jasmine [name] is a 81 y.o. female with a history of CAD S/P CABG and recent PCI on 06/11/2014 revealing NonObstructive Disease findings, Hx of IHSS, Atrial Fibrillation, Diastolic CHF , Pulmonary HTN who presents to the ED with complaints of substernal area chest pain and Epigastric ABD Pain and palpitations .   Clinical Impression  Pt very pleasant with reports of gas pain as her biggest limiting factor. Pt reports she has not been OOB in chair due to fatigue and missed her first session of HHPT. Pt educated for bil LE HEP, ambulation program for home and need to increase mobility and function to increase activity tolerance. Will follow acutely to maximize strength, gait and balance to increase independence.     Follow Up Recommendations Home health PT;Supervision - Intermittent    Equipment Recommendations  None recommended by PT    Recommendations for Other Services       Precautions / Restrictions Precautions Precautions: None      Mobility  Bed Mobility Overal bed mobility: Modified Independent                Transfers Overall transfer level: Modified independent                  Ambulation/Gait Ambulation/Gait assistance: Supervision Ambulation Distance (Feet): 300 Feet Assistive device: None Gait Pattern/deviations: Step-through pattern;Decreased stride length   Gait velocity interpretation: Below normal speed for age/gender General Gait Details: decreased activity tolerance due to fatigue  Stairs Stairs: Yes Stairs assistance: Modified independent (Device/Increase time) Stair Management: One rail Left;Alternating pattern;Forwards Number of Stairs: 5    Wheelchair Mobility    Modified Rankin (Stroke Patients Only)       Balance     Sitting balance-Leahy Scale: Good       Standing  balance-Leahy Scale: Good                               Pertinent Vitals/Pain Pain Assessment: No/denies pain    Home Living Family/patient expects to be discharged to:: Private residence Living Arrangements: Children Available Help at Discharge: Family Type of Home: House Home Access: Stairs to enter Entrance Stairs-Rails: Right Entrance Stairs-Number of Steps: 4 Home Layout: One level Home Equipment: Shower seat      Prior Function Level of Independence: Independent         Comments: Pt does not drive, but still assists with cooking.  denies h/o falls      Hand Dominance        Extremity/Trunk Assessment   Upper Extremity Assessment: Overall WFL for tasks assessed           Lower Extremity Assessment: Generalized weakness      Cervical / Trunk Assessment: Normal  Communication   Communication: No difficulties  Cognition Arousal/Alertness: Awake/alert Behavior During Therapy: WFL for tasks assessed/performed Overall Cognitive Status: Within Functional Limits for tasks assessed                      General Comments      Exercises General Exercises - Lower Extremity Long Arc Quad: AROM;Seated;Both;15 reps Hip ABduction/ADduction: Seated;AROM;Both;15 reps Hip Flexion/Marching: AROM;Seated;Both;15 reps Toe Raises: AROM;Seated;Both;15 reps Heel Raises: Seated;AROM;Both;15 reps      Assessment/Plan    PT Assessment Patient needs continued PT services  PT Diagnosis Difficulty walking   PT Problem List Decreased activity tolerance;Decreased balance;Decreased mobility  PT Treatment Interventions Gait training;Functional mobility training;Therapeutic activities;Therapeutic exercise;Balance training;Patient/family education   PT Goals (Current goals can be found in the Care Plan section) Acute Rehab PT Goals Patient Stated Goal: return home PT Goal Formulation: With patient/family Time For Goal Achievement: 07/09/14 Potential to  Achieve Goals: Good    Frequency Min 3X/week   Barriers to discharge        Co-evaluation               End of Session   Activity Tolerance: Patient tolerated treatment well Patient left: in chair;with call bell/phone within reach;with family/visitor present Nurse Communication: Mobility status         Time: 1357-1415 PT Time Calculation (min) (ACUTE ONLY): 18 min   Charges:   PT Evaluation $Initial PT Evaluation Tier I: 1 Procedure PT Treatments $Therapeutic Exercise: 8-22 mins   PT G Codes:        Melford Aase 06/25/2014, 2:32 PM Elwyn Reach, [place]

## 2014-06-25 NOTE — Progress Notes (Addendum)
Patient ID: Jasmine [name]  female  [id number]    DOB: 15-Jul-1924    DOA: 06/22/2014  PCP: [name], MD  Brief history of present illness  Patient is 81 year old female with CAD, CABG, recent cardiac cath on 06/11/14, chronic atrial fibrillation (not on anticoagulation due to recurrent GI bleeds), severe MR s/p pericardial tissue MVR, mild AI and diastolic dysfunction present of the chest pain and palpitations. Patient reported substernal area chest pain, epigastric abdominal pain and palpitations since 6 PM yesterday, pain was relieved after nitroglycerin. In ED she was found to have atrial fibrillation with RVR. She was placed on Cardizem drip, admitted to stepdown unit. Patient was admitted with similar presentation on 06/10/14 with chest pain, troponin was slightly positive. Cardiology was consulted, patient underwent cardiac catheterization. Cath showed mild LV dysfunction, significant mid distal anterolateral hypocontractility, focal apical akinesis, mid to distal septal wall and severe hypo-to akinesis, no significant coronary obstructive disease with minimal luminal irregularities of proximal RCA, 10-20% RCA narrowing otherwise normal LAD and circumflex vessels. EF 45%  Assessment/Plan: Principal Problem:   Atrial fibrillation with RVR - Currently off the Cardizem drip, resumed oral Cardizem and metoprolol.  -  patient was seen by cardiology during previous admission, had similar presentation, was seen by Dr. Ellyn Hack. Patient has refused anticoagulation in the past and also has history of recurrent GI bleeds, recommended aspirin by cardiology. The bioprosthetic mitral valve, she does not require full anticoagulation. - 2-D echo showed EF of 50-55%, mild hypokinesis of anteroseptal myocardium, cardiology following - Continue aspirin.   Active Problems: Right groin hematoma - Patient still has right groin hematoma at the site of cardiac -  localized ultrasound still  pending  Atypical chest pain, epigastric pain - Troponins positive, 0.06->0.05>-0.05,still complaining of intermittent chest pain/epigastric pain - Recent cardiac cath on 1/5 had shown normal coronary arteries, possibly has demand ischemia in the setting of A. fib with RVR with hypertrophic cardiomyopathy. - Also placed on PPI, 2-D echo with EF of 50-55% - GI called for evaluation      history of IHSS (idiopathic hypertrophic subaortic stenosis), severe MR status post pericardial tissue MVR, chronic systolic CHF - Currently stable, monitor I's and O's and daily weights, continue Lasix    Gastroesophageal reflux disease with esophagitis -Continue PPI,  increased to twice a day     Chronic kidney disease (CKD), stage III (moderate) - Creatinine function slightly worsened to 1.2 today, monitor closely, patient was in atrial fibrillation with RVR which could decrease renal perfusion   DVT Prophylaxis: heparin subcutaneous   Code Status: DO NOT RESUSCITATE   Family Communication:  Disposition: Start PT, out of bed to chair, await cardiology recommendations  Consultants: None  Procedures  None  Antibiotics  none     Subjective: Patient seen and examined, chest pain improved, denies any active chest pains, shortness breath, fevers or chills, nausea  Objective  Weight change: -2.087 kg (-4 lb 9.6 oz)  Intake/Output Summary (Last 24 hours) at 06/25/14 1138 Last data filed at 06/25/14 0957  Gross per 24 hour  Intake    570 ml  Output   2200 ml  Net  -1630 ml   Blood pressure 124/68, pulse 72, temperature 98.4 F (36.9 C), temperature source Oral, resp. rate 18, height 5\' 8"  (1.727 m), weight 74.182 kg (163 lb 8.7 oz), SpO2 100 %.  Physical Exam: General: Alert and awake, oriented x3, NAD CVS: Irregularly irregular, 2/6 SEM RSB Chest: clear to auscultation bilaterally,  no wheezing, rales or rhonchi Abdomen: soft nontender, nondistended, normal bowel sounds   Extremities: no cyanosis, clubbing or edema noted bilaterally   Lab Results: Basic Metabolic Panel:  Recent Labs Lab 06/24/14 0255 06/25/14 0311  NA 137 139  K 4.0 3.6  CL 102 103  CO2 28 27  GLUCOSE 106* 94  BUN 7 9  CREATININE 1.28* 1.27*  CALCIUM 8.9 8.8   Liver Function Tests:  Recent Labs Lab 06/23/14 0010  AST 23  ALT 14  ALKPHOS 53  BILITOT 0.9  PROT 6.5  ALBUMIN 3.3*    Recent Labs Lab 06/23/14 0010  LIPASE 29   No results for input(s): AMMONIA in the last 168 hours. CBC:  Recent Labs Lab 06/23/14 0640 06/24/14 0813  WBC 10.2 8.5  HGB 10.0* 10.4*  HCT 31.3* 32.5*  MCV 92.1 92.3  PLT 373 380   Cardiac Enzymes:  Recent Labs Lab 06/23/14 0953 06/23/14 1508 06/23/14 2102  TROPONINI 0.06* 0.05* 0.05*   BNP: Invalid input(s): POCBNP CBG: No results for input(s): GLUCAP in the last 168 hours.   Micro Results: Recent Results (from the past 240 hour(s))  MRSA PCR Screening     Status: None   Collection Time: 06/23/14  3:45 AM  Result Value Ref Range Status   MRSA by PCR NEGATIVE NEGATIVE Final    Comment:        The GeneXpert MRSA Assay (FDA approved for NASAL specimens only), is one component of a comprehensive MRSA colonization surveillance program. It is not intended to diagnose MRSA infection nor to guide or monitor treatment for MRSA infections.     Studies/Results: Dg Chest Port 1 View  06/23/2014   CLINICAL DATA:  Chest pain and shortness of breath.  EXAM: PORTABLE CHEST - 1 VIEW  COMPARISON:  06/10/2014  FINDINGS: Patient is post median sternotomy. Dual lead left-sided pacemaker remains in place. Cardiomegaly is stable. There is blunting of the costophrenic angles that appears similar to prior exam. Mild vascular congestion. No confluent airspace disease. No pneumothorax. No acute osseous abnormality.  IMPRESSION: Cardiomegaly, mild vascular congestion, and bilateral pleural effusions. These findings are unchanged and  likely represent mild congestive failure.   Electronically Signed   By: Jeb Levering M.D.   On: 06/23/2014 00:50   Dg Chest Port 1 View  06/10/2014   CLINICAL DATA:  Shortness of breath and central chest pain for 1 day.  EXAM: PORTABLE CHEST - 1 VIEW  COMPARISON:  07/08/2012.  FINDINGS: Interval mildly enlarged cardiac silhouette with increased prominence of the pulmonary vasculature and interstitial markings. Small bilateral pleural effusions. Stable median sternotomy wires and left subclavian pacemaker leads. Mild right shoulder degenerative changes.  IMPRESSION: Interval mild cardiomegaly and mild changes of congestive heart failure.   Electronically Signed   By: Enrique Sack M.D.   On: 06/10/2014 20:23    Medications: Scheduled Meds: . aspirin  81 mg Oral Daily  . calcium-vitamin D  1 tablet Oral Daily  . diltiazem  120 mg Oral Q24H  . dorzolamide  1 drop Both Eyes BID  . ferrous sulfate  325 mg Oral Daily  . fluticasone  1 spray Each Nare Daily  . fluticasone  2 puff Inhalation BID  . heparin subcutaneous  5,000 Units Subcutaneous Q8H  . loratadine  10 mg Oral Daily  . LORazepam  1 mg Oral QHS  . memantine  10 mg Oral BID  . metoprolol succinate  50 mg Oral QHS  . pantoprazole  40 mg Oral BID AC  . potassium chloride  10 mEq Oral Daily  . pravastatin  20 mg Oral q1800  . sodium chloride  3 mL Intravenous Q12H  . sodium chloride  3 mL Intravenous Q12H  . vitamin E  400 Units Oral Daily    time spent : 25 minutes    LOS: 3 days   Elisabet Gutzmer M.D. [facility] 06/25/2014, 11:38 AM Pager: [phone]  If 7PM-7AM, please contact night-coverage [email or web address] Password [email or web address]

## 2014-06-26 LAB — BASIC METABOLIC PANEL
ANION GAP: 10 (ref 5–15)
BUN: 13 mg/dL (ref 6–23)
CHLORIDE: 101 meq/L (ref 96–112)
CO2: 25 mmol/L (ref 19–32)
CREATININE: 1.37 mg/dL — AB (ref 0.50–1.10)
Calcium: 9.5 mg/dL (ref 8.4–10.5)
GFR calc non Af Amer: 34 mL/min — ABNORMAL LOW (ref >90)
GFR, EST AFRICAN AMERICAN: 39 mL/min — AB (ref >90)
Glucose, Bld: 141 mg/dL — ABNORMAL HIGH (ref 70–99)
POTASSIUM: 4.9 mmol/L (ref 3.5–5.1)
Sodium: 136 mmol/L (ref 135–145)

## 2014-06-26 MED ORDER — METHYLPREDNISOLONE 4 MG PO KIT: PACK | ORAL | Status: DC

## 2014-06-26 MED ORDER — ZOLPIDEM TARTRATE 5 MG PO TABS
2.5000 mg | ORAL_TABLET | Freq: Once | ORAL | Status: AC
  Administered 2014-06-26: 2.5 mg via ORAL
  Filled 2014-06-26: qty 1

## 2014-06-26 MED ORDER — POTASSIUM CHLORIDE CRYS ER 10 MEQ PO TBCR: 10.0000 meq | EXTENDED_RELEASE_TABLET | Freq: Every day | ORAL | Status: DC

## 2014-06-26 MED ORDER — ISOSORBIDE MONONITRATE ER 30 MG PO TB24: 15.0000 mg | ORAL_TABLET | Freq: Every day | ORAL | Status: DC

## 2014-06-26 MED ORDER — DILTIAZEM HCL ER COATED BEADS 120 MG PO CP24: 120.0000 mg | ORAL_CAPSULE | ORAL | Status: DC

## 2014-06-26 MED ORDER — PANTOPRAZOLE SODIUM 40 MG PO TBEC: 40.0000 mg | DELAYED_RELEASE_TABLET | Freq: Two times a day (BID) | ORAL | Status: AC

## 2014-06-26 NOTE — Progress Notes (Signed)
Iv removed without complication.  Pt is non-telemetry.  Explained discharge instructions to pt, and she had no further questions at this time.  Pt in no acute distress.

## 2014-06-26 NOTE — Discharge Summary (Signed)
Physician Discharge Summary  Jasmine [name] QQV:[id number] DOB: 08-Mar-1925 DOA: 06/22/2014  PCP: [name], MD  Admit date: 06/22/2014 Discharge date: 06/26/2014  Time spent: 35 minutes  Recommendations for Outpatient Follow-up:  1. Follow up with Dr. Collene Mares in 2-4 week. 2. Cardiology in 2 weeks. Follow up on HTN.  Discharge Diagnoses:  Principal Problem:   Atrial fibrillation with RVR Active Problems:   IHSS (idiopathic hypertrophic subaortic stenosis)   Coronary artery disease, non-occlusive by cardiac cath   Systolic and diastolic CHF, chronic   Gastroesophageal reflux disease without esophagitis   Atypical chest pain   Chronic kidney disease (CKD), stage III (moderate)   DVT prophylaxis   Discharge Condition: stable  Diet recommendation: heart healthy  Filed Weights   06/24/14 1755 06/25/14 0519 06/26/14 0447  Weight: 74.662 kg (164 lb 9.6 oz) 74.182 kg (163 lb 8.7 oz) 74.753 kg (164 lb 12.8 oz)    History of present illness:  81 y.o. female with a history of CAD S/P CABG and recent PCI on 06/11/2014 revealing NonObstructive Disease findings, Hx of IHSS, Atrial Fibrillation, Diastolic CHF , Pulmonary HTN who presents to the ED with complaints of substernal area chest pain and Epigastric ABD Pain and palpitations since 6 pm. The pain was relieved after NTG, and in the ED she was found to have   Hospital Course:  Atrial fibrillation with RVR: - Started on Cardizem, once rate controlled transition to oral Cardizem and metoprolol.  - patient was seen by cardiology during previous admission, had similar presentation, was seen by Dr. Ellyn Hack.  - Patient has refused anticoagulation in the past and also has history of recurrent GI bleeds, recommended aspirin by cardiology.  - 2-D echo showed EF of 50-55%, mild hypokinesis of anteroseptal myocardium, cardiology following  Right groin hematoma - Patient still has right groin hematoma at the site of cardiac -Doppler of  groin area No evidence of pseudoaneurysm. - No evidence of arteriovenous fistula.  Atypical chest pain, epigastric pain - Troponins positive, 0.06->0.05>-0.05,still complaining of intermittent chest pain/epigastric pain - Recent cardiac cath on 1/5 had shown normal coronary arteries, possibly has demand ischemia in the setting of A. fib with RVR with hypertrophic cardiomyopathy. - GI consulted recommended steroids and pain improved, home on tapated.  History of IHSS (idiopathic hypertrophic subaortic stenosis), severe MR status post pericardial tissue MVR, chronic systolic CHF - Currently stable, monitor I's and O's and daily weights, continue Lasix  Gastroesophageal reflux disease with esophagitis -Continue PPI, increased to twice a day  Chronic kidney disease (CKD), stage III (moderate) - Creatinine function slightly worsened to 1.2 today, monitor closely, patient was in atrial fibrillation with RVR which could decrease renal perfusion    Procedures:  Doppler of groin  Consultations:  GI  Discharge Exam: Filed Vitals:   06/26/14 0447  BP: 129/60  Pulse: 78  Temp: 97.9 F (36.6 C)  Resp: 18    General: A&O x3 Cardiovascular: RRR Respiratory: good air movement CTA B/L  Discharge Instructions   Discharge Instructions    Diet - low sodium heart healthy    Complete by:  As directed      Increase activity slowly    Complete by:  As directed           Current Discharge Medication List    START taking these medications   Details  isosorbide mononitrate (IMDUR) 30 MG 24 hr tablet Take 0.5 tablets (15 mg total) by mouth daily. Qty: 30 tablet, Refills: 0  methylPREDNISolone (MEDROL DOSEPAK) 4 MG tablet follow package directions Qty: 21 tablet, Refills: 0    potassium chloride (K-DUR,KLOR-CON) 10 MEQ tablet Take 1 tablet (10 mEq total) by mouth daily. Qty: 30 tablet, Refills: 0      CONTINUE these medications which have CHANGED   Details  diltiazem  (CARDIZEM CD) 120 MG 24 hr capsule Take 1 capsule (120 mg total) by mouth daily. Qty: 30 capsule, Refills: 0    !! pantoprazole (PROTONIX) 40 MG tablet Take 1 tablet (40 mg total) by mouth 2 (two) times daily before a meal. Qty: 60 tablet, Refills: 3     !! - Potential duplicate medications found. Please discuss with provider.    CONTINUE these medications which have NOT CHANGED   Details  acetaminophen (TYLENOL) 500 MG tablet Take 500 mg by mouth every 6 (six) hours as needed. For pain    albuterol (PROVENTIL HFA;VENTOLIN HFA) 108 (90 BASE) MCG/ACT inhaler Inhale 2 puffs into the lungs every 4 (four) hours as needed for wheezing or shortness of breath. For shortness of breath    aspirin 81 MG chewable tablet Chew 1 tablet (81 mg total) by mouth daily.    budesonide (PULMICORT) 180 MCG/ACT inhaler Inhale 2 puffs into the lungs as needed (SOB/Wheezing).     Calcium Carbonate-Vitamin D (CALTRATE 600+D) 600-400 MG-UNIT per tablet Take 1 tablet by mouth daily.    cetirizine (ZYRTEC) 10 MG tablet Take 10 mg by mouth at bedtime as needed for allergies. For allergy symptoms    dorzolamide (TRUSOPT) 2 % ophthalmic solution Place 1 drop into both eyes 2 (two) times daily.    Ferrous Sulfate (IRON) 28 MG TABS Take 1 tablet by mouth daily.    fluticasone (FLONASE) 50 MCG/ACT nasal spray Place into both nostrils as needed for allergies or rhinitis.    lisinopril (PRINIVIL,ZESTRIL) 10 MG tablet Take 1 tablet (10 mg total) by mouth daily. Qty: 30 tablet, Refills: 2    LORazepam (ATIVAN) 1 MG tablet Take 1 mg by mouth at bedtime.     lovastatin (MEVACOR) 20 MG tablet Take 20 mg by mouth at bedtime.    memantine (NAMENDA) 10 MG tablet Take 10 mg by mouth 2 (two) times daily.    metoprolol succinate (TOPROL-XL) 50 MG 24 hr tablet Take 1 tablet (50 mg total) by mouth at bedtime. Take with or immediately following a meal. Qty: 30 tablet, Refills: 2    Multiple Vitamins-Minerals (ALIVE WOMENS 50+  PO) Take by mouth daily.    nitroGLYCERIN (NITROSTAT) 0.4 MG SL tablet Place 1 tablet (0.4 mg total) under the tongue every 5 (five) minutes as needed for chest pain. Qty: 10 tablet, Refills: 2    !! pantoprazole (PROTONIX) 40 MG tablet Take 40 mg by mouth daily.    Simethicone (GAS-X EXTRA STRENGTH) 125 MG CAPS Take 125 mg by mouth as needed (gas).     vitamin E 400 UNIT capsule Take 400 Units by mouth daily.     !! - Potential duplicate medications found. Please discuss with provider.     Allergies  Allergen Reactions  . Cymbalta [Duloxetine Hcl]     Diarrhea, weakness   Follow-up Information    Follow up with [facility].   Why:  [place] within 24-48 hours of discharge; Registered Nurse and Physical Therapy services    Contact information:   [address] [place] [address] [phone]       Follow up with [facility]  R, MD In 2 weeks.   Specialty:  Cardiology   Why:  hospital follow up   Contact information:   [address] [place] [address] [phone]        The results of significant diagnostics from this hospitalization (including imaging, microbiology, ancillary and laboratory) are listed below for reference.    Significant Diagnostic Studies: Dg Chest Port 1 View  06/23/2014   CLINICAL DATA:  Chest pain and shortness of breath.  EXAM: PORTABLE CHEST - 1 VIEW  COMPARISON:  06/10/2014  FINDINGS: Patient is post median sternotomy. Dual lead left-sided pacemaker remains in place. Cardiomegaly is stable. There is blunting of the costophrenic angles that appears similar to prior exam. Mild vascular congestion. No confluent airspace disease. No pneumothorax. No acute osseous abnormality.  IMPRESSION: Cardiomegaly, mild vascular congestion, and bilateral pleural effusions. These findings are unchanged and likely represent mild congestive failure.   Electronically Signed   By: Jeb Levering M.D.   On:  06/23/2014 00:50   Dg Chest Port 1 View  06/10/2014   CLINICAL DATA:  Shortness of breath and central chest pain for 1 day.  EXAM: PORTABLE CHEST - 1 VIEW  COMPARISON:  07/08/2012.  FINDINGS: Interval mildly enlarged cardiac silhouette with increased prominence of the pulmonary vasculature and interstitial markings. Small bilateral pleural effusions. Stable median sternotomy wires and left subclavian pacemaker leads. Mild right shoulder degenerative changes.  IMPRESSION: Interval mild cardiomegaly and mild changes of congestive heart failure.   Electronically Signed   By: Enrique Sack M.D.   On: 06/10/2014 20:23    Microbiology: Recent Results (from the past 240 hour(s))  MRSA PCR Screening     Status: None   Collection Time: 06/23/14  3:45 AM  Result Value Ref Range Status   MRSA by PCR NEGATIVE NEGATIVE Final    Comment:        The GeneXpert MRSA Assay (FDA approved for NASAL specimens only), is one component of a comprehensive MRSA colonization surveillance program. It is not intended to diagnose MRSA infection nor to guide or monitor treatment for MRSA infections.      Labs: Basic Metabolic Panel:  Recent Labs Lab 06/22/14 2350 06/23/14 0640 06/24/14 0255 06/25/14 0311 06/26/14 0402  NA 135 140 137 139 136  K 4.0 3.7 4.0 3.6 4.9  CL 104 108 102 103 101  CO2 22 23 28 27 25   GLUCOSE 103* 97 106* 94 141*  BUN 7 7 7 9 13   CREATININE 1.03 1.04 1.28* 1.27* 1.37*  CALCIUM 8.9 8.7 8.9 8.8 9.5   Liver Function Tests:  Recent Labs Lab 06/23/14 0010  AST 23  ALT 14  ALKPHOS 53  BILITOT 0.9  PROT 6.5  ALBUMIN 3.3*    Recent Labs Lab 06/23/14 0010  LIPASE 29   No results for input(s): AMMONIA in the last 168 hours. CBC:  Recent Labs Lab 06/22/14 2350 06/23/14 0640 06/24/14 0813  WBC 9.5 10.2 8.5  HGB 10.5* 10.0* 10.4*  HCT 33.1* 31.3* 32.5*  MCV 91.9 92.1 92.3  PLT 400 373 380   Cardiac Enzymes:  Recent Labs Lab 06/23/14 0953 06/23/14 1508  06/23/14 2102  TROPONINI 0.06* 0.05* 0.05*   BNP: BNP (last 3 results) No results for input(s): PROBNP in the last 8760 hours. CBG: No results for input(s): GLUCAP in the last 168 hours.     Signed:  Charlynne Cousins  [facility] 06/26/2014, 9:04 AM

## 2014-06-28 DIAGNOSIS — I421 Obstructive hypertrophic cardiomyopathy: Secondary | ICD-10-CM | POA: Diagnosis not present

## 2014-06-28 DIAGNOSIS — N183 Chronic kidney disease, stage 3 (moderate): Secondary | ICD-10-CM | POA: Diagnosis not present

## 2014-06-28 DIAGNOSIS — I2510 Atherosclerotic heart disease of native coronary artery without angina pectoris: Secondary | ICD-10-CM | POA: Diagnosis not present

## 2014-06-28 DIAGNOSIS — K219 Gastro-esophageal reflux disease without esophagitis: Secondary | ICD-10-CM | POA: Diagnosis not present

## 2014-06-28 DIAGNOSIS — I480 Paroxysmal atrial fibrillation: Secondary | ICD-10-CM | POA: Diagnosis not present

## 2014-06-28 DIAGNOSIS — E785 Hyperlipidemia, unspecified: Secondary | ICD-10-CM | POA: Diagnosis not present

## 2014-06-28 DIAGNOSIS — I5042 Chronic combined systolic (congestive) and diastolic (congestive) heart failure: Secondary | ICD-10-CM | POA: Diagnosis not present

## 2014-06-28 DIAGNOSIS — I340 Nonrheumatic mitral (valve) insufficiency: Secondary | ICD-10-CM | POA: Diagnosis not present

## 2014-06-28 DIAGNOSIS — I272 Other secondary pulmonary hypertension: Secondary | ICD-10-CM | POA: Diagnosis not present

## 2014-07-01 DIAGNOSIS — I5042 Chronic combined systolic (congestive) and diastolic (congestive) heart failure: Secondary | ICD-10-CM | POA: Diagnosis not present

## 2014-07-01 DIAGNOSIS — K219 Gastro-esophageal reflux disease without esophagitis: Secondary | ICD-10-CM | POA: Diagnosis not present

## 2014-07-01 DIAGNOSIS — I272 Other secondary pulmonary hypertension: Secondary | ICD-10-CM | POA: Diagnosis not present

## 2014-07-01 DIAGNOSIS — I340 Nonrheumatic mitral (valve) insufficiency: Secondary | ICD-10-CM | POA: Diagnosis not present

## 2014-07-01 DIAGNOSIS — E785 Hyperlipidemia, unspecified: Secondary | ICD-10-CM | POA: Diagnosis not present

## 2014-07-01 DIAGNOSIS — I421 Obstructive hypertrophic cardiomyopathy: Secondary | ICD-10-CM | POA: Diagnosis not present

## 2014-07-01 DIAGNOSIS — N183 Chronic kidney disease, stage 3 (moderate): Secondary | ICD-10-CM | POA: Diagnosis not present

## 2014-07-01 DIAGNOSIS — I2510 Atherosclerotic heart disease of native coronary artery without angina pectoris: Secondary | ICD-10-CM | POA: Diagnosis not present

## 2014-07-01 DIAGNOSIS — I480 Paroxysmal atrial fibrillation: Secondary | ICD-10-CM | POA: Diagnosis not present

## 2014-07-02 ENCOUNTER — Encounter: Payer: Self-pay | Admitting: Nurse Practitioner

## 2014-07-02 ENCOUNTER — Ambulatory Visit (INDEPENDENT_AMBULATORY_CARE_PROVIDER_SITE_OTHER): Payer: Commercial Managed Care - HMO | Admitting: Nurse Practitioner

## 2014-07-02 VITALS — BP 136/72 | HR 89 | Ht 68.0 in | Wt 168.0 lb

## 2014-07-02 DIAGNOSIS — I10 Essential (primary) hypertension: Secondary | ICD-10-CM

## 2014-07-02 DIAGNOSIS — I214 Non-ST elevation (NSTEMI) myocardial infarction: Secondary | ICD-10-CM

## 2014-07-02 DIAGNOSIS — I421 Obstructive hypertrophic cardiomyopathy: Secondary | ICD-10-CM | POA: Diagnosis not present

## 2014-07-02 DIAGNOSIS — N183 Chronic kidney disease, stage 3 unspecified: Secondary | ICD-10-CM

## 2014-07-02 DIAGNOSIS — I4819 Other persistent atrial fibrillation: Secondary | ICD-10-CM

## 2014-07-02 DIAGNOSIS — I2510 Atherosclerotic heart disease of native coronary artery without angina pectoris: Secondary | ICD-10-CM | POA: Insufficient documentation

## 2014-07-02 DIAGNOSIS — I222 Subsequent non-ST elevation (NSTEMI) myocardial infarction: Secondary | ICD-10-CM | POA: Diagnosis not present

## 2014-07-02 DIAGNOSIS — I5032 Chronic diastolic (congestive) heart failure: Secondary | ICD-10-CM | POA: Diagnosis not present

## 2014-07-02 DIAGNOSIS — I481 Persistent atrial fibrillation: Secondary | ICD-10-CM

## 2014-07-02 DIAGNOSIS — I4891 Unspecified atrial fibrillation: Secondary | ICD-10-CM

## 2014-07-02 MED ORDER — METOPROLOL SUCCINATE ER 50 MG PO TB24: 50.0000 mg | ORAL_TABLET | Freq: Every day | ORAL | Status: DC

## 2014-07-02 NOTE — Patient Instructions (Signed)
START TAKING METOPROLOL 50 MG ONCE A DAY    FOLLOW UP WITH DR TURNER IN 2 MONTHS

## 2014-07-02 NOTE — Progress Notes (Signed)
Patient Name: Jasmine [name] Date of Encounter: 07/02/2014  Primary Care Provider:  [name], MD Primary Cardiologist:  T. Turner, MD   Chief Complaint  81 year old female with a history of hypertrophic obstructive cardiomyopathy, bioprosthetic mitral valve, atrial fibrillation, and recent non-STEMI who presents following 2 hospitalizations earlier this month.  Past Medical History   Past Medical History  Diagnosis Date  . GERD (gastroesophageal reflux disease)   . Insomnia   . Eczema   . Glaucoma   . HOCM (hypertrophic obstructive cardiomyopathy)     a. s/p septal myomectomy;  b. 06/2014 Echo: EF 50-55%, mild antsept HK, mild MR, mild MS/MR (bioprosth valve), mildly dil LA, mod TR, PASP 20mmHg.  . High cholesterol   . Non-obstructive coronary artery disease     a. 06/2014 NSTEMI/Cath: LM nl, LAD nl, LCX nl, RCA 10-70m, EF 45%.  . Blood transfusion   . Chronic kidney disease (CKD), stage III (moderate)   . History of Severe Mitral Regurgitation     a. s/p MVR with pericardial tissue valve  . Diverticulosis of colon with hemorrhage 08/12/2012  . History of GI Bleed   . Asthma   . Hypertension   . Reflux esophagitis   . Pulmonary HTN     a. mild to moderate (PASP 38mmHg by echo 06/2014).  Marland Kitchen Aortic insufficiency     a. Not seen on echo 06/2014.  . Bradycardia     a. 10/2011 s/p SJM Accent DR DC PPM, ser #: [id number].  . Atrial fibrillation and flutter     a. atrial fibrillation s/p DCCV 06-2012;  b. now in chronic atrial fibrillation off anticoagulation due to GI bleed.  . Paroxysmal atrial tachycardia   . PVCs (premature ventricular contractions)   . DCM (dilated cardiomyopathy)     a. 50% echo 06/2012;  b. 06/2014 Echo: EF 50-55%.  Marland Kitchen Heart murmur   . Dementia   . Atypical chest pain    Past Surgical History  Procedure Laterality Date  . Cardiac catheterization    . Cardiac valve replacement  2007    MVR  . Myomectomy  2007    septal  . Tonsillectomy    .  Appendectomy    . Cholecystectomy    . Tubal ligation    . Hernia repair      "in my stomach"  . Cataract extraction w/ intraocular lens  implant, bilateral    . Cardioversion  07/04/2012    Procedure: CARDIOVERSION;  Surgeon: Sueanne Margarita, MD;  Location: [place];  Service: Cardiovascular;  Laterality: N/A;  h/p in file drawer   . Colonoscopy    . Colon resection    . Colonoscopy N/A 08/13/2012    Procedure: COLONOSCOPY;  Surgeon: Gatha Mayer, MD;  Location: [place];  Service: Endoscopy;  Laterality: N/A;  . Permanent pacemaker insertion N/A 10/07/2011    Procedure: PERMANENT PACEMAKER INSERTION;  Surgeon: Deboraha Sprang, MD;  Location: [facility] CATH LAB;  Service: Cardiovascular;  Laterality: N/A;  . Left heart catheterization with coronary angiogram N/A 06/11/2014    Procedure: LEFT HEART CATHETERIZATION WITH CORONARY ANGIOGRAM;  Surgeon: Troy Sine, MD;  Location: [facility] CATH LAB;  Service: Cardiovascular;  Laterality: N/A;    Allergies  Allergies  Allergen Reactions  . Cymbalta [Duloxetine Hcl]     Diarrhea, weakness    HPI  81 year old female with the above complex problem list. She has a prior history of hypertrophic obstructive cardiomyopathy status post septal myomectomy and  bioprosthetic mitral valve replacement. She also has a history of persistent atrial fibrillation however is not on oral anticoagulation secondary to prior history of GI bleeds. Earlier this month, she was admitted to [facility] secondary to complaints of chest pain. She ruled in for non-ST elevation MI. We were consulted and catheterization was performed revealing minimal nonobstructive disease. It was felt that her troponin rise was most likely secondary to demand ischemia in the setting of A. fib with rapid rates and hypertrophic cardiomyopathy. She was discharged on medical therapy but returned to [facility] on January 16 secondary to recurrent chest, epigastric, and abdominal discomfort. In that  setting, heart rates were elevated. On that occasion, troponin trend was flat. An echocardiogram was performed and showed normal LV function with a normally functioning bioprosthetic mitral valve. Chest pain was not felt to be cardiac in origin. She was seen by gastroenterology and they felt that symptoms may be chest wall in nature. She was started on a Medrol dose pack and subsequently discharged.  Since discharge, she has done reasonably well. She has not been having chest discomfort but has had some dyspnea on exertion. She weighs herself daily and her weight at home has been stable at 167 - 168 pounds. She denies PND, orthopnea, dizziness, syncope, edema, or early satiety. She does continue to experience intermittent abdominal bloating and epigastric discomfort similar to what prompted her hospitalization on January 18.  Home Medications  Prior to Admission medications   Medication Sig Start Date End Date Taking? Authorizing Provider  acetaminophen (TYLENOL) 500 MG tablet Take 500 mg by mouth every 6 (six) hours as needed. For pain   Yes Historical Provider, MD  albuterol (PROVENTIL HFA;VENTOLIN HFA) 108 (90 BASE) MCG/ACT inhaler Inhale 2 puffs into the lungs every 4 (four) hours as needed for wheezing or shortness of breath. For shortness of breath   Yes Historical Provider, MD  aspirin 81 MG chewable tablet Chew 1 tablet (81 mg total) by mouth daily. 06/14/14  Yes Marianne L York, PA-C  budesonide (PULMICORT) 180 MCG/ACT inhaler Inhale 2 puffs into the lungs as needed (SOB/Wheezing).    Yes Historical Provider, MD  Calcium Carbonate-Vitamin D (CALTRATE 600+D) 600-400 MG-UNIT per tablet Take 1 tablet by mouth daily.   Yes Historical Provider, MD  cetirizine (ZYRTEC) 10 MG tablet Take 10 mg by mouth at bedtime as needed for allergies. For allergy symptoms   Yes Historical Provider, MD  diltiazem (CARDIZEM CD) 120 MG 24 hr capsule Take 1 capsule (120 mg total) by mouth daily. 06/26/14  Yes Charlynne Cousins, MD  dorzolamide (TRUSOPT) 2 % ophthalmic solution Place 1 drop into both eyes 2 (two) times daily.   Yes Historical Provider, MD  Ferrous Sulfate (IRON) 28 MG TABS Take 1 tablet by mouth daily.   Yes Historical Provider, MD  fluticasone (FLONASE) 50 MCG/ACT nasal spray Place into both nostrils as needed for allergies or rhinitis.   Yes Historical Provider, MD  isosorbide mononitrate (IMDUR) 30 MG 24 hr tablet Take 0.5 tablets (15 mg total) by mouth daily. 06/26/14  Yes Charlynne Cousins, MD  lisinopril (PRINIVIL,ZESTRIL) 20 MG tablet Take 20 mg by mouth daily. 06/17/14  Yes Historical Provider, MD  LORazepam (ATIVAN) 1 MG tablet Take 1 mg by mouth at bedtime.    Yes Historical Provider, MD  lovastatin (MEVACOR) 20 MG tablet Take 20 mg by mouth at bedtime.   Yes Historical Provider, MD  memantine (NAMENDA) 10 MG tablet Take 10 mg  by mouth 2 (two) times daily.   Yes Historical Provider, MD  methylPREDNISolone (MEDROL DOSEPAK) 4 MG tablet follow package directions 06/26/14  Yes Charlynne Cousins, MD  Multiple Vitamins-Minerals (ALIVE WOMENS 50+ PO) Take by mouth daily.   Yes Historical Provider, MD  nitroGLYCERIN (NITROSTAT) 0.4 MG SL tablet Place 1 tablet (0.4 mg total) under the tongue every 5 (five) minutes as needed for chest pain. 06/14/14  Yes Bobby Rumpf York, PA-C  pantoprazole (PROTONIX) 40 MG tablet Take 1 tablet (40 mg total) by mouth 2 (two) times daily before a meal. 06/26/14  Yes Charlynne Cousins, MD  potassium chloride (K-DUR,KLOR-CON) 10 MEQ tablet Take 1 tablet (10 mEq total) by mouth daily. 06/26/14  Yes Charlynne Cousins, MD  Simethicone (GAS-X EXTRA STRENGTH) 125 MG CAPS Take 125 mg by mouth as needed (gas).    Yes Historical Provider, MD  vitamin E 400 UNIT capsule Take 400 Units by mouth daily.   Yes Historical Provider, MD    Review of Systems  As above, she has continued to have some degree of abdominal bloating and epigastric discomfort though overall this is  better than what it had been. She finishes her Medrol Dosepak today. She has not had anginal chest pain but does have some dyspnea on exertion. She denies PND, orthopnea, dizziness, syncope, or early satiety.  All other systems reviewed and are otherwise negative except as noted above.  Physical Exam  Blood pressure 136/72, pulse 89, height 5\' 8"  (1.727 m), weight 168 lb (76.204 kg).  General: Pleasant, NAD Psych: Normal affect. Neuro: Alert and oriented X 3. Moves all extremities spontaneously. HEENT: Normal  Neck: Supple without bruits or JVD. Lungs:  Resp regular and unlabored, scattered rhonchi. Heart: Irregularly irregular, no s3, s4, or murmurs. Abdomen: Soft, non-tender, non-distended, BS + x 4.  Extremities: No clubbing, cyanosis or edema. DP/PT/Radials 2+ and equal bilaterally.  Accessory Clinical Findings  ECG - atrial fibrillation with pacing on demand, 89, left bundle branch block with inferolateral ST and T changes-no acute changes.  Assessment & Plan  1.  Non-ST segment elevation myocardial infarction, subsequent episode of care/nonobstructive coronary artery disease: Patient was hospitalized earlier this month with chest pain, A. fib with RVR, and elevated troponins. Catheterization revealed nonobstructive disease. It was felt that troponin elevation was likely secondary to demand ischemia in the setting of elevated heart rates and hypertrophic obstructive cardiomyopathy. She has not been having any anginal chest discomfort though she has had some dyspnea on exertion. Her heart rate in clinic today is 89 at rest with a stable blood pressure. I will titrate her metoprolol to 50 mg daily (she is currently at 25). She is otherwise on low-dose aspirin and statin therapy.  2. Persistent atrial fibrillation: Rate is 89 today and was higher during her hospitalization. As above, she has a history of demand ischemia related to rapid rates. I will titrate her Toprol-XL to 50 mg daily. She  is also on diltiazem 120 mg daily. She is currently on low-dose aspirin only as she was intolerant of oral anticoagulation in the past in the setting of GI bleeds.  CHA2DS2VASc = 5.  3.  Hypertrophic obstructive cardiomyopathy/chronic diastolic congestive heart failure: Weight has been stable at home. Volume is stable today. Titrate beta blocker as above for better heart rate control.  4. Hypertension: Home health nursing has been coming out to the house and per patient, blood pressures have been very good at home. She is slightly elevated  today and I am titrating beta blocker for better heart rate control. She will continue to follow blood pressure at home.  5. Hyperlipidemia: She is on statin therapy with an LDL of 78 and normal LFTs last week.  6. Status post bioprosthetic mitral valve replacement: Only mild MR and mitral stenosis on recent echo with normal LV function.  7. Stage III chronic kidney disease: Creatinine was 1.37 on January 20. She is on an ACE inhibitor.  8. Disposition: Follow-up with Dr. Radford Pax in 2 months or sooner if necessary.  Murray Hodgkins, NP 07/02/2014, 11:39 AM

## 2014-07-03 DIAGNOSIS — K219 Gastro-esophageal reflux disease without esophagitis: Secondary | ICD-10-CM | POA: Diagnosis not present

## 2014-07-03 DIAGNOSIS — I272 Other secondary pulmonary hypertension: Secondary | ICD-10-CM | POA: Diagnosis not present

## 2014-07-03 DIAGNOSIS — I2510 Atherosclerotic heart disease of native coronary artery without angina pectoris: Secondary | ICD-10-CM | POA: Diagnosis not present

## 2014-07-03 DIAGNOSIS — N183 Chronic kidney disease, stage 3 (moderate): Secondary | ICD-10-CM | POA: Diagnosis not present

## 2014-07-03 DIAGNOSIS — I421 Obstructive hypertrophic cardiomyopathy: Secondary | ICD-10-CM | POA: Diagnosis not present

## 2014-07-03 DIAGNOSIS — I481 Persistent atrial fibrillation: Secondary | ICD-10-CM | POA: Diagnosis not present

## 2014-07-03 DIAGNOSIS — I5042 Chronic combined systolic (congestive) and diastolic (congestive) heart failure: Secondary | ICD-10-CM | POA: Diagnosis not present

## 2014-07-03 DIAGNOSIS — E785 Hyperlipidemia, unspecified: Secondary | ICD-10-CM | POA: Diagnosis not present

## 2014-07-03 DIAGNOSIS — I480 Paroxysmal atrial fibrillation: Secondary | ICD-10-CM | POA: Diagnosis not present

## 2014-07-03 DIAGNOSIS — G4700 Insomnia, unspecified: Secondary | ICD-10-CM | POA: Diagnosis not present

## 2014-07-03 DIAGNOSIS — I340 Nonrheumatic mitral (valve) insufficiency: Secondary | ICD-10-CM | POA: Diagnosis not present

## 2014-07-04 ENCOUNTER — Telehealth: Payer: Self-pay | Admitting: Cardiology

## 2014-07-04 DIAGNOSIS — I421 Obstructive hypertrophic cardiomyopathy: Secondary | ICD-10-CM | POA: Diagnosis not present

## 2014-07-04 DIAGNOSIS — I272 Other secondary pulmonary hypertension: Secondary | ICD-10-CM | POA: Diagnosis not present

## 2014-07-04 DIAGNOSIS — E785 Hyperlipidemia, unspecified: Secondary | ICD-10-CM | POA: Diagnosis not present

## 2014-07-04 DIAGNOSIS — I5042 Chronic combined systolic (congestive) and diastolic (congestive) heart failure: Secondary | ICD-10-CM | POA: Diagnosis not present

## 2014-07-04 DIAGNOSIS — N183 Chronic kidney disease, stage 3 (moderate): Secondary | ICD-10-CM | POA: Diagnosis not present

## 2014-07-04 DIAGNOSIS — I480 Paroxysmal atrial fibrillation: Secondary | ICD-10-CM | POA: Diagnosis not present

## 2014-07-04 DIAGNOSIS — I2510 Atherosclerotic heart disease of native coronary artery without angina pectoris: Secondary | ICD-10-CM | POA: Diagnosis not present

## 2014-07-04 DIAGNOSIS — K219 Gastro-esophageal reflux disease without esophagitis: Secondary | ICD-10-CM | POA: Diagnosis not present

## 2014-07-04 DIAGNOSIS — I340 Nonrheumatic mitral (valve) insufficiency: Secondary | ICD-10-CM | POA: Diagnosis not present

## 2014-07-04 NOTE — Telephone Encounter (Signed)
Patient st she had some chest discomfort last night, but is better now.  She knows that if she does have chest pain, to take SL nitro.  Reviewed SL instructions for nitro with patient and told her if she has to take 3 with no relief to call 911. Patient agrees with treatment plan.  To Dr. Radford Pax for review.

## 2014-07-04 NOTE — Telephone Encounter (Signed)
New message     Pt c/o of Chest Pain: STAT if CP now or developed within 24 hours  1. Are you having CP right now?no 2. Are you experiencing any other symptoms (ex. SOB, nausea, vomiting, sweating)?  no  3. How long have you been experiencing CP? Started 8pm wed and stopped 3:00 am 4. Is your CP continuous or coming and going? Continuous from 8pm to 3am  5. Have you taken Nitroglycerin?no This was reported by the home health nurse.  Wanted doctor to know?

## 2014-07-07 DIAGNOSIS — K219 Gastro-esophageal reflux disease without esophagitis: Secondary | ICD-10-CM | POA: Diagnosis not present

## 2014-07-07 DIAGNOSIS — N183 Chronic kidney disease, stage 3 (moderate): Secondary | ICD-10-CM | POA: Diagnosis not present

## 2014-07-07 DIAGNOSIS — I272 Other secondary pulmonary hypertension: Secondary | ICD-10-CM | POA: Diagnosis not present

## 2014-07-07 DIAGNOSIS — I340 Nonrheumatic mitral (valve) insufficiency: Secondary | ICD-10-CM | POA: Diagnosis not present

## 2014-07-07 DIAGNOSIS — I421 Obstructive hypertrophic cardiomyopathy: Secondary | ICD-10-CM | POA: Diagnosis not present

## 2014-07-07 DIAGNOSIS — I5042 Chronic combined systolic (congestive) and diastolic (congestive) heart failure: Secondary | ICD-10-CM | POA: Diagnosis not present

## 2014-07-07 DIAGNOSIS — E785 Hyperlipidemia, unspecified: Secondary | ICD-10-CM | POA: Diagnosis not present

## 2014-07-07 DIAGNOSIS — I480 Paroxysmal atrial fibrillation: Secondary | ICD-10-CM | POA: Diagnosis not present

## 2014-07-07 DIAGNOSIS — I2510 Atherosclerotic heart disease of native coronary artery without angina pectoris: Secondary | ICD-10-CM | POA: Diagnosis not present

## 2014-07-07 NOTE — Telephone Encounter (Signed)
Please find out quality of CP - she just had heart cath that showed no significant CAD

## 2014-07-08 NOTE — Telephone Encounter (Signed)
Jasmine [name] st that it was a "burning pain" and described it as "gas pain." She said this has occurred a couple times over the weekend but resolved quickly with no medications.  She did not associate the "gas pains" with meal times.

## 2014-07-08 NOTE — Telephone Encounter (Signed)
Please have her see her PCP - she had cath in 06/2014 with minimal nonobstructive ASCAD

## 2014-07-09 NOTE — Telephone Encounter (Signed)
Pt is aware of MD's recommendations. Pt states she think that she may have gastric reflux. Pt verbalized understanding.

## 2014-07-10 DIAGNOSIS — I480 Paroxysmal atrial fibrillation: Secondary | ICD-10-CM | POA: Diagnosis not present

## 2014-07-10 DIAGNOSIS — I272 Other secondary pulmonary hypertension: Secondary | ICD-10-CM | POA: Diagnosis not present

## 2014-07-10 DIAGNOSIS — I5042 Chronic combined systolic (congestive) and diastolic (congestive) heart failure: Secondary | ICD-10-CM | POA: Diagnosis not present

## 2014-07-10 DIAGNOSIS — K219 Gastro-esophageal reflux disease without esophagitis: Secondary | ICD-10-CM | POA: Diagnosis not present

## 2014-07-10 DIAGNOSIS — I340 Nonrheumatic mitral (valve) insufficiency: Secondary | ICD-10-CM | POA: Diagnosis not present

## 2014-07-10 DIAGNOSIS — N183 Chronic kidney disease, stage 3 (moderate): Secondary | ICD-10-CM | POA: Diagnosis not present

## 2014-07-10 DIAGNOSIS — E785 Hyperlipidemia, unspecified: Secondary | ICD-10-CM | POA: Diagnosis not present

## 2014-07-10 DIAGNOSIS — I2510 Atherosclerotic heart disease of native coronary artery without angina pectoris: Secondary | ICD-10-CM | POA: Diagnosis not present

## 2014-07-10 DIAGNOSIS — I421 Obstructive hypertrophic cardiomyopathy: Secondary | ICD-10-CM | POA: Diagnosis not present

## 2014-07-11 DIAGNOSIS — I5042 Chronic combined systolic (congestive) and diastolic (congestive) heart failure: Secondary | ICD-10-CM | POA: Diagnosis not present

## 2014-07-11 DIAGNOSIS — E785 Hyperlipidemia, unspecified: Secondary | ICD-10-CM | POA: Diagnosis not present

## 2014-07-11 DIAGNOSIS — K219 Gastro-esophageal reflux disease without esophagitis: Secondary | ICD-10-CM | POA: Diagnosis not present

## 2014-07-11 DIAGNOSIS — I2510 Atherosclerotic heart disease of native coronary artery without angina pectoris: Secondary | ICD-10-CM | POA: Diagnosis not present

## 2014-07-11 DIAGNOSIS — I480 Paroxysmal atrial fibrillation: Secondary | ICD-10-CM | POA: Diagnosis not present

## 2014-07-11 DIAGNOSIS — I421 Obstructive hypertrophic cardiomyopathy: Secondary | ICD-10-CM | POA: Diagnosis not present

## 2014-07-11 DIAGNOSIS — N183 Chronic kidney disease, stage 3 (moderate): Secondary | ICD-10-CM | POA: Diagnosis not present

## 2014-07-11 DIAGNOSIS — I340 Nonrheumatic mitral (valve) insufficiency: Secondary | ICD-10-CM | POA: Diagnosis not present

## 2014-07-11 DIAGNOSIS — I272 Other secondary pulmonary hypertension: Secondary | ICD-10-CM | POA: Diagnosis not present

## 2014-07-16 DIAGNOSIS — N183 Chronic kidney disease, stage 3 (moderate): Secondary | ICD-10-CM | POA: Diagnosis not present

## 2014-07-16 DIAGNOSIS — I421 Obstructive hypertrophic cardiomyopathy: Secondary | ICD-10-CM | POA: Diagnosis not present

## 2014-07-16 DIAGNOSIS — I5042 Chronic combined systolic (congestive) and diastolic (congestive) heart failure: Secondary | ICD-10-CM | POA: Diagnosis not present

## 2014-07-16 DIAGNOSIS — K219 Gastro-esophageal reflux disease without esophagitis: Secondary | ICD-10-CM | POA: Diagnosis not present

## 2014-07-16 DIAGNOSIS — E785 Hyperlipidemia, unspecified: Secondary | ICD-10-CM | POA: Diagnosis not present

## 2014-07-16 DIAGNOSIS — I272 Other secondary pulmonary hypertension: Secondary | ICD-10-CM | POA: Diagnosis not present

## 2014-07-16 DIAGNOSIS — I480 Paroxysmal atrial fibrillation: Secondary | ICD-10-CM | POA: Diagnosis not present

## 2014-07-16 DIAGNOSIS — I2510 Atherosclerotic heart disease of native coronary artery without angina pectoris: Secondary | ICD-10-CM | POA: Diagnosis not present

## 2014-07-16 DIAGNOSIS — I340 Nonrheumatic mitral (valve) insufficiency: Secondary | ICD-10-CM | POA: Diagnosis not present

## 2014-07-17 DIAGNOSIS — I340 Nonrheumatic mitral (valve) insufficiency: Secondary | ICD-10-CM | POA: Diagnosis not present

## 2014-07-17 DIAGNOSIS — I5042 Chronic combined systolic (congestive) and diastolic (congestive) heart failure: Secondary | ICD-10-CM | POA: Diagnosis not present

## 2014-07-17 DIAGNOSIS — I2510 Atherosclerotic heart disease of native coronary artery without angina pectoris: Secondary | ICD-10-CM | POA: Diagnosis not present

## 2014-07-17 DIAGNOSIS — I480 Paroxysmal atrial fibrillation: Secondary | ICD-10-CM | POA: Diagnosis not present

## 2014-07-17 DIAGNOSIS — I272 Other secondary pulmonary hypertension: Secondary | ICD-10-CM | POA: Diagnosis not present

## 2014-07-17 DIAGNOSIS — N183 Chronic kidney disease, stage 3 (moderate): Secondary | ICD-10-CM | POA: Diagnosis not present

## 2014-07-17 DIAGNOSIS — I421 Obstructive hypertrophic cardiomyopathy: Secondary | ICD-10-CM | POA: Diagnosis not present

## 2014-07-17 DIAGNOSIS — E785 Hyperlipidemia, unspecified: Secondary | ICD-10-CM | POA: Diagnosis not present

## 2014-07-17 DIAGNOSIS — K219 Gastro-esophageal reflux disease without esophagitis: Secondary | ICD-10-CM | POA: Diagnosis not present

## 2014-07-18 DIAGNOSIS — N183 Chronic kidney disease, stage 3 (moderate): Secondary | ICD-10-CM | POA: Diagnosis not present

## 2014-07-18 DIAGNOSIS — I5042 Chronic combined systolic (congestive) and diastolic (congestive) heart failure: Secondary | ICD-10-CM | POA: Diagnosis not present

## 2014-07-18 DIAGNOSIS — K219 Gastro-esophageal reflux disease without esophagitis: Secondary | ICD-10-CM | POA: Diagnosis not present

## 2014-07-18 DIAGNOSIS — E785 Hyperlipidemia, unspecified: Secondary | ICD-10-CM | POA: Diagnosis not present

## 2014-07-18 DIAGNOSIS — I421 Obstructive hypertrophic cardiomyopathy: Secondary | ICD-10-CM | POA: Diagnosis not present

## 2014-07-18 DIAGNOSIS — I340 Nonrheumatic mitral (valve) insufficiency: Secondary | ICD-10-CM | POA: Diagnosis not present

## 2014-07-18 DIAGNOSIS — I2510 Atherosclerotic heart disease of native coronary artery without angina pectoris: Secondary | ICD-10-CM | POA: Diagnosis not present

## 2014-07-18 DIAGNOSIS — I480 Paroxysmal atrial fibrillation: Secondary | ICD-10-CM | POA: Diagnosis not present

## 2014-07-18 DIAGNOSIS — I272 Other secondary pulmonary hypertension: Secondary | ICD-10-CM | POA: Diagnosis not present

## 2014-07-22 DIAGNOSIS — N183 Chronic kidney disease, stage 3 (moderate): Secondary | ICD-10-CM | POA: Diagnosis not present

## 2014-07-22 DIAGNOSIS — E785 Hyperlipidemia, unspecified: Secondary | ICD-10-CM | POA: Diagnosis not present

## 2014-07-22 DIAGNOSIS — I480 Paroxysmal atrial fibrillation: Secondary | ICD-10-CM | POA: Diagnosis not present

## 2014-07-22 DIAGNOSIS — I2510 Atherosclerotic heart disease of native coronary artery without angina pectoris: Secondary | ICD-10-CM | POA: Diagnosis not present

## 2014-07-22 DIAGNOSIS — I421 Obstructive hypertrophic cardiomyopathy: Secondary | ICD-10-CM | POA: Diagnosis not present

## 2014-07-22 DIAGNOSIS — I5042 Chronic combined systolic (congestive) and diastolic (congestive) heart failure: Secondary | ICD-10-CM | POA: Diagnosis not present

## 2014-07-22 DIAGNOSIS — K219 Gastro-esophageal reflux disease without esophagitis: Secondary | ICD-10-CM | POA: Diagnosis not present

## 2014-07-22 DIAGNOSIS — I340 Nonrheumatic mitral (valve) insufficiency: Secondary | ICD-10-CM | POA: Diagnosis not present

## 2014-07-22 DIAGNOSIS — I272 Other secondary pulmonary hypertension: Secondary | ICD-10-CM | POA: Diagnosis not present

## 2014-07-23 DIAGNOSIS — R110 Nausea: Secondary | ICD-10-CM | POA: Diagnosis not present

## 2014-07-23 DIAGNOSIS — D509 Iron deficiency anemia, unspecified: Secondary | ICD-10-CM | POA: Diagnosis not present

## 2014-07-23 DIAGNOSIS — R1310 Dysphagia, unspecified: Secondary | ICD-10-CM | POA: Diagnosis not present

## 2014-07-23 DIAGNOSIS — K219 Gastro-esophageal reflux disease without esophagitis: Secondary | ICD-10-CM | POA: Diagnosis not present

## 2014-07-25 DIAGNOSIS — I5042 Chronic combined systolic (congestive) and diastolic (congestive) heart failure: Secondary | ICD-10-CM | POA: Diagnosis not present

## 2014-07-25 DIAGNOSIS — E785 Hyperlipidemia, unspecified: Secondary | ICD-10-CM | POA: Diagnosis not present

## 2014-07-25 DIAGNOSIS — I2510 Atherosclerotic heart disease of native coronary artery without angina pectoris: Secondary | ICD-10-CM | POA: Diagnosis not present

## 2014-07-25 DIAGNOSIS — N183 Chronic kidney disease, stage 3 (moderate): Secondary | ICD-10-CM | POA: Diagnosis not present

## 2014-07-25 DIAGNOSIS — I480 Paroxysmal atrial fibrillation: Secondary | ICD-10-CM | POA: Diagnosis not present

## 2014-07-25 DIAGNOSIS — I421 Obstructive hypertrophic cardiomyopathy: Secondary | ICD-10-CM | POA: Diagnosis not present

## 2014-07-25 DIAGNOSIS — I340 Nonrheumatic mitral (valve) insufficiency: Secondary | ICD-10-CM | POA: Diagnosis not present

## 2014-07-25 DIAGNOSIS — I272 Other secondary pulmonary hypertension: Secondary | ICD-10-CM | POA: Diagnosis not present

## 2014-07-25 DIAGNOSIS — K219 Gastro-esophageal reflux disease without esophagitis: Secondary | ICD-10-CM | POA: Diagnosis not present

## 2014-07-31 ENCOUNTER — Encounter: Payer: Self-pay | Admitting: *Deleted

## 2014-08-12 ENCOUNTER — Telehealth: Payer: Self-pay | Admitting: Internal Medicine

## 2014-08-12 NOTE — Telephone Encounter (Signed)
Appt made to see Dr. Caryl Comes 11/21/14. Pt not having any new symptoms per [name]. They are willing to wait until June to see Dr. Caryl Comes

## 2014-08-12 NOTE — Telephone Encounter (Signed)
New messaeg  Pt has physician pacer check due. No [facility] appts. Pt wanted to know if/when she can come in to device clinic for check. Please call back and discuss.

## 2014-08-28 DIAGNOSIS — E780 Pure hypercholesterolemia: Secondary | ICD-10-CM | POA: Diagnosis not present

## 2014-08-28 DIAGNOSIS — F0390 Unspecified dementia without behavioral disturbance: Secondary | ICD-10-CM | POA: Diagnosis not present

## 2014-08-28 DIAGNOSIS — J301 Allergic rhinitis due to pollen: Secondary | ICD-10-CM | POA: Diagnosis not present

## 2014-08-28 DIAGNOSIS — N183 Chronic kidney disease, stage 3 (moderate): Secondary | ICD-10-CM | POA: Diagnosis not present

## 2014-08-28 DIAGNOSIS — I481 Persistent atrial fibrillation: Secondary | ICD-10-CM | POA: Diagnosis not present

## 2014-08-28 DIAGNOSIS — I10 Essential (primary) hypertension: Secondary | ICD-10-CM | POA: Diagnosis not present

## 2014-09-16 NOTE — Progress Notes (Signed)
Cardiology Office Note   Date:  09/17/2014   ID:  Jasmine [name], DOB 12-09-1924, MRN [id number]  PCP:  [name], MD    Chief Complaint  Patient presents with  . Atrial Fibrillation  . Mitral Regurgitation  . Hypertension      History of Present Illness:   81 year old female with history of hypertrophic obstructive cardiomyopathy status post septal myomectomy and bioprosthetic mitral valve replacement. She also has a history of persistent atrial fibrillation however is not on oral anticoagulation secondary to prior history of GI bleeds. Earlier this year, she was admitted to [facility] secondary to complaints of chest pain. She ruled in for non-ST elevation MI. We were consulted and catheterization was performed revealing minimal nonobstructive disease. It was felt that her troponin rise was most likely secondary to demand ischemia in the setting of A. fib with rapid rates and hypertrophic cardiomyopathy. She was discharged on medical therapy but returned to [facility] on January 16 secondary to recurrent chest, epigastric, and abdominal discomfort. In that setting, heart rates were elevated. On that occasion, troponin trend was flat. An echocardiogram was performed and showed normal LV function with a normally functioning bioprosthetic mitral valve. Chest pain was not felt to be cardiac in origin. She was seen by gastroenterology and they felt that symptoms may be chest wall in nature. She was started on a Medrol dose pack and subsequently discharged.  Since discharge, she has done reasonably well. She continues to have episodic chest discomfort on the right and left  but has had minimal dyspnea on exertion. She weighs herself daily and her weight at home has been stable at 160 pounds. She denies PND, orthopnea, dizziness, syncope, edema, or early satiety.    Past Medical History  Diagnosis Date  . GERD (gastroesophageal reflux disease)   . Insomnia   . Eczema   . Glaucoma     . HOCM (hypertrophic obstructive cardiomyopathy)     a. s/p septal myomectomy;  b. 06/2014 Echo: EF 50-55%, mild antsept HK, mild MR, mild MS/MR (bioprosth valve), mildly dil LA, mod TR, PASP 57mmHg.  . High cholesterol   . Non-obstructive coronary artery disease     a. 06/2014 NSTEMI/Cath: LM nl, LAD nl, LCX nl, RCA 10-26m, EF 45%.  . Blood transfusion   . Chronic kidney disease (CKD), stage III (moderate)   . History of Severe Mitral Regurgitation     a. s/p MVR with pericardial tissue valve  . Diverticulosis of colon with hemorrhage 08/12/2012  . History of GI Bleed   . Asthma   . Hypertension   . Reflux esophagitis   . Pulmonary HTN     a. mild to moderate (PASP 28mmHg by echo 06/2014).  Marland Kitchen Aortic insufficiency     a. Not seen on echo 06/2014.  . Bradycardia     a. 10/2011 s/p SJM Accent DR DC PPM, ser #: [id number].  . Atrial fibrillation and flutter     a. atrial fibrillation s/p DCCV 06-2012;  b. now in chronic atrial fibrillation off anticoagulation due to GI bleed.  . Paroxysmal atrial tachycardia   . PVCs (premature ventricular contractions)   . DCM (dilated cardiomyopathy)     a. 50% echo 06/2012;  b. 06/2014 Echo: EF 50-55%.  Marland Kitchen Heart murmur   . Dementia   . Atypical chest pain     Past Surgical History  Procedure Laterality Date  . Cardiac catheterization    . Cardiac valve replacement  2007    MVR  . Myomectomy  2007    septal  . Tonsillectomy    . Appendectomy    . Cholecystectomy    . Tubal ligation    . Hernia repair      "in my stomach"  . Cataract extraction w/ intraocular lens  implant, bilateral    . Cardioversion  07/04/2012    Procedure: CARDIOVERSION;  Surgeon: Sueanne Margarita, MD;  Location: [place];  Service: Cardiovascular;  Laterality: N/A;  h/p in file drawer   . Colonoscopy    . Colon resection    . Colonoscopy N/A 08/13/2012    Procedure: COLONOSCOPY;  Surgeon: Gatha Mayer, MD;  Location: [place];  Service: Endoscopy;  Laterality: N/A;  .  Permanent pacemaker insertion N/A 10/07/2011    Procedure: PERMANENT PACEMAKER INSERTION;  Surgeon: Deboraha Sprang, MD;  Location: [facility] CATH LAB;  Service: Cardiovascular;  Laterality: N/A;  . Left heart catheterization with coronary angiogram N/A 06/11/2014    Procedure: LEFT HEART CATHETERIZATION WITH CORONARY ANGIOGRAM;  Surgeon: Troy Sine, MD;  Location: [facility] CATH LAB;  Service: Cardiovascular;  Laterality: N/A;     Current Outpatient Prescriptions  Medication Sig Dispense Refill  . acetaminophen (TYLENOL) 500 MG tablet Take 500 mg by mouth every 6 (six) hours as needed. For pain    . albuterol (PROVENTIL HFA;VENTOLIN HFA) 108 (90 BASE) MCG/ACT inhaler Inhale 2 puffs into the lungs every 4 (four) hours as needed for wheezing or shortness of breath. For shortness of breath    . aspirin 81 MG chewable tablet Chew 1 tablet (81 mg total) by mouth daily.    . budesonide (PULMICORT) 180 MCG/ACT inhaler Inhale 2 puffs into the lungs as needed (SOB/Wheezing).     . Calcium Carbonate-Vitamin D (CALTRATE 600+D) 600-400 MG-UNIT per tablet Take 1 tablet by mouth daily.    . cetirizine (ZYRTEC) 10 MG tablet Take 10 mg by mouth at bedtime as needed for allergies. For allergy symptoms    . diltiazem (CARDIZEM CD) 120 MG 24 hr capsule Take 1 capsule (120 mg total) by mouth daily. 30 capsule 0  . dorzolamide (TRUSOPT) 2 % ophthalmic solution Place 1 drop into both eyes 2 (two) times daily.    . Ferrous Sulfate (IRON) 28 MG TABS Take 1 tablet by mouth daily.    . fluticasone (FLONASE) 50 MCG/ACT nasal spray Place into both nostrils as needed for allergies or rhinitis.    Marland Kitchen isosorbide mononitrate (IMDUR) 30 MG 24 hr tablet Take 0.5 tablets (15 mg total) by mouth daily. 30 tablet 0  . lisinopril (PRINIVIL,ZESTRIL) 20 MG tablet Take 20 mg by mouth daily.    Marland Kitchen LORazepam (ATIVAN) 1 MG tablet Take 1 mg by mouth at bedtime.     . lovastatin (MEVACOR) 20 MG tablet Take 20 mg by mouth at bedtime.    . memantine  (NAMENDA) 10 MG tablet Take 10 mg by mouth 2 (two) times daily.    . metoprolol succinate (TOPROL-XL) 50 MG 24 hr tablet Take 1 tablet (50 mg total) by mouth daily. Take with or immediately following a meal. 30 tablet 5  . Multiple Vitamins-Minerals (ALIVE WOMENS 50+ PO) Take by mouth daily.    . nitroGLYCERIN (NITROSTAT) 0.4 MG SL tablet Place 1 tablet (0.4 mg total) under the tongue every 5 (five) minutes as needed for chest pain. 10 tablet 2  . pantoprazole (PROTONIX) 40 MG tablet Take 1 tablet (40 mg total) by mouth 2 (  two) times daily before a meal. 60 tablet 3  . potassium chloride (K-DUR,KLOR-CON) 10 MEQ tablet Take 1 tablet (10 mEq total) by mouth daily. 30 tablet 0  . Simethicone (GAS-X EXTRA STRENGTH) 125 MG CAPS Take 125 mg by mouth as needed (gas).     . vitamin E 400 UNIT capsule Take 400 Units by mouth daily.     No current facility-administered medications for this visit.    Allergies:   Cymbalta    Social History:  The patient  reports that she quit smoking about 40 years ago. Her smoking use included Cigarettes. She quit smokeless tobacco use about 39 years ago. Her smokeless tobacco use included Snuff. She reports that she does not drink alcohol or use illicit drugs.   Family History:  The patient's family history includes CVA in her brother and mother; Hypertension in her brother and mother.    ROS:  Please see the history of present illness.   Otherwise, review of systems are positive for none.   All other systems are reviewed and negative.    PHYSICAL EXAM: VS:  BP 132/80 mmHg  Pulse 86  Ht 5\' 8"  (1.727 m)  Wt 165 lb 12.8 oz (75.206 kg)  BMI 25.22 kg/m2  SpO2 99% , BMI Body mass index is 25.22 kg/(m^2). GEN: Well nourished, well developed, in no acute distress HEENT: normal Neck: no JVD, carotid bruits, or masses Cardiac: RRR; no murmurs, rubs, or gallops,no edema  Respiratory:  clear to auscultation bilaterally, normal work of breathing GI: soft, nontender,  nondistended, + BS MS: no deformity or atrophy Skin: warm and dry, no rash Neuro:  Strength and sensation are intact Psych: euthymic mood, full affect   EKG:  EKG is not ordered today.    Recent Labs: 06/10/2014: TSH 2.320 06/12/2014: Magnesium 2.1 06/23/2014: ALT 14; B Natriuretic Peptide 430.5* 06/24/2014: Hemoglobin 10.4*; Platelets 380 06/26/2014: BUN 13; Creatinine 1.37*; Potassium 4.9; Sodium 136    Lipid Panel    Component Value Date/Time   CHOL 138 06/12/2014 0520   TRIG 75 06/12/2014 0520   HDL 45 06/12/2014 0520   CHOLHDL 3.1 06/12/2014 0520   VLDL 15 06/12/2014 0520   LDLCALC 78 06/12/2014 0520      Wt Readings from Last 3 Encounters:  09/17/14 165 lb 12.8 oz (75.206 kg)  07/02/14 168 lb (76.204 kg)  06/26/14 164 lb 12.8 oz (74.753 kg)    ASSESSMENT AND PLAN:  1. HTN - controlled - continue Cardizem/Lisinopril/metoprolol  2. Severe MR s/p MVR with pericardial tissue valve 3. Chronic Atrial fibrillation - She is asymptomatic and HR is controlled. - continue metoprolol/Cardizem - not on systemic anticoagulation due to GI bleed  4. Bradycardia s/p PPM 5. Mild LV dysfunction EF 50% - continue ACE I/beta blocker.  Appear euvolemic on exam 6. Mild AI by echo 08/2012 7. Mild to moderate pulmonary HTN - resolved by echo 08/2012 8. Atypical CP resolves with GasX - c/w GI etiology 9. Nonobstructive ASCAD with remote NSTEMI by cath 2016 with no significant CAD - continue ASA    Current medicines are reviewed at length with the patient today.  The patient does not have concerns regarding medicines.  The following changes have been made:  no change  Labs/ tests ordered today include: see above assessment and plan No orders of the defined types were placed in this encounter.     Disposition:   FU with me in 6 months   Signed, Sueanne Margarita, MD  09/17/2014  11:30 AM    [facility] [address], [place], [place]  [address] Phone: [phone]; Fax: [phone]

## 2014-09-17 ENCOUNTER — Encounter: Payer: Self-pay | Admitting: Cardiology

## 2014-09-17 ENCOUNTER — Ambulatory Visit (INDEPENDENT_AMBULATORY_CARE_PROVIDER_SITE_OTHER): Payer: Commercial Managed Care - HMO | Admitting: Cardiology

## 2014-09-17 VITALS — BP 132/80 | HR 86 | Ht 68.0 in | Wt 165.8 lb

## 2014-09-17 DIAGNOSIS — I482 Chronic atrial fibrillation, unspecified: Secondary | ICD-10-CM

## 2014-09-17 DIAGNOSIS — Z952 Presence of prosthetic heart valve: Secondary | ICD-10-CM

## 2014-09-17 DIAGNOSIS — Z950 Presence of cardiac pacemaker: Secondary | ICD-10-CM

## 2014-09-17 DIAGNOSIS — I340 Nonrheumatic mitral (valve) insufficiency: Secondary | ICD-10-CM | POA: Diagnosis not present

## 2014-09-17 DIAGNOSIS — I420 Dilated cardiomyopathy: Secondary | ICD-10-CM | POA: Diagnosis not present

## 2014-09-17 DIAGNOSIS — Z953 Presence of xenogenic heart valve: Secondary | ICD-10-CM

## 2014-09-17 DIAGNOSIS — I5042 Chronic combined systolic (congestive) and diastolic (congestive) heart failure: Secondary | ICD-10-CM

## 2014-09-17 DIAGNOSIS — Z7901 Long term (current) use of anticoagulants: Secondary | ICD-10-CM

## 2014-09-17 DIAGNOSIS — I2583 Coronary atherosclerosis due to lipid rich plaque: Secondary | ICD-10-CM

## 2014-09-17 DIAGNOSIS — I2510 Atherosclerotic heart disease of native coronary artery without angina pectoris: Secondary | ICD-10-CM

## 2014-09-17 NOTE — Patient Instructions (Signed)

## 2014-10-17 DIAGNOSIS — K625 Hemorrhage of anus and rectum: Secondary | ICD-10-CM | POA: Diagnosis not present

## 2014-10-17 DIAGNOSIS — K5730 Diverticulosis of large intestine without perforation or abscess without bleeding: Secondary | ICD-10-CM | POA: Diagnosis not present

## 2014-10-18 ENCOUNTER — Encounter (HOSPITAL_COMMUNITY): Payer: Self-pay | Admitting: Family Medicine

## 2014-10-18 ENCOUNTER — Inpatient Hospital Stay (HOSPITAL_COMMUNITY)
Admission: EM | Admit: 2014-10-18 | Discharge: 2014-10-20 | DRG: 378 | Disposition: A | Discharge status: Home / Self Care | Payer: Commercial Managed Care - HMO | Attending: Internal Medicine | Admitting: Internal Medicine

## 2014-10-18 DIAGNOSIS — I482 Chronic atrial fibrillation, unspecified: Secondary | ICD-10-CM | POA: Diagnosis present

## 2014-10-18 DIAGNOSIS — Z950 Presence of cardiac pacemaker: Secondary | ICD-10-CM | POA: Diagnosis not present

## 2014-10-18 DIAGNOSIS — K219 Gastro-esophageal reflux disease without esophagitis: Secondary | ICD-10-CM | POA: Diagnosis present

## 2014-10-18 DIAGNOSIS — K922 Gastrointestinal hemorrhage, unspecified: Secondary | ICD-10-CM | POA: Diagnosis present

## 2014-10-18 DIAGNOSIS — R531 Weakness: Secondary | ICD-10-CM | POA: Diagnosis not present

## 2014-10-18 DIAGNOSIS — I129 Hypertensive chronic kidney disease with stage 1 through stage 4 chronic kidney disease, or unspecified chronic kidney disease: Secondary | ICD-10-CM | POA: Diagnosis present

## 2014-10-18 DIAGNOSIS — K5730 Diverticulosis of large intestine without perforation or abscess without bleeding: Secondary | ICD-10-CM | POA: Diagnosis not present

## 2014-10-18 DIAGNOSIS — I272 Other secondary pulmonary hypertension: Secondary | ICD-10-CM | POA: Diagnosis present

## 2014-10-18 DIAGNOSIS — Z8249 Family history of ischemic heart disease and other diseases of the circulatory system: Secondary | ICD-10-CM | POA: Diagnosis not present

## 2014-10-18 DIAGNOSIS — N183 Chronic kidney disease, stage 3 unspecified: Secondary | ICD-10-CM | POA: Diagnosis present

## 2014-10-18 DIAGNOSIS — F0390 Unspecified dementia without behavioral disturbance: Secondary | ICD-10-CM | POA: Diagnosis not present

## 2014-10-18 DIAGNOSIS — Z79899 Other long term (current) drug therapy: Secondary | ICD-10-CM

## 2014-10-18 DIAGNOSIS — I2510 Atherosclerotic heart disease of native coronary artery without angina pectoris: Secondary | ICD-10-CM | POA: Diagnosis present

## 2014-10-18 DIAGNOSIS — I421 Obstructive hypertrophic cardiomyopathy: Secondary | ICD-10-CM | POA: Diagnosis not present

## 2014-10-18 DIAGNOSIS — K625 Hemorrhage of anus and rectum: Secondary | ICD-10-CM | POA: Diagnosis not present

## 2014-10-18 DIAGNOSIS — Z7951 Long term (current) use of inhaled steroids: Secondary | ICD-10-CM | POA: Diagnosis not present

## 2014-10-18 DIAGNOSIS — Z823 Family history of stroke: Secondary | ICD-10-CM | POA: Diagnosis not present

## 2014-10-18 DIAGNOSIS — I252 Old myocardial infarction: Secondary | ICD-10-CM

## 2014-10-18 DIAGNOSIS — K5731 Diverticulosis of large intestine without perforation or abscess with bleeding: Principal | ICD-10-CM | POA: Diagnosis present

## 2014-10-18 DIAGNOSIS — K5900 Constipation, unspecified: Secondary | ICD-10-CM | POA: Diagnosis not present

## 2014-10-18 DIAGNOSIS — E780 Pure hypercholesterolemia: Secondary | ICD-10-CM | POA: Diagnosis present

## 2014-10-18 DIAGNOSIS — J45909 Unspecified asthma, uncomplicated: Secondary | ICD-10-CM | POA: Diagnosis present

## 2014-10-18 LAB — COMPREHENSIVE METABOLIC PANEL
ALBUMIN: 3.7 g/dL (ref 3.5–5.0)
ALT: 16 U/L (ref 14–54)
AST: 22 U/L (ref 15–41)
Alkaline Phosphatase: 45 U/L (ref 38–126)
Anion gap: 8 (ref 5–15)
BUN: 8 mg/dL (ref 6–20)
CHLORIDE: 107 mmol/L (ref 101–111)
CO2: 24 mmol/L (ref 22–32)
CREATININE: 1.13 mg/dL — AB (ref 0.44–1.00)
Calcium: 8.9 mg/dL (ref 8.9–10.3)
GFR calc non Af Amer: 42 mL/min — ABNORMAL LOW (ref >60)
GFR, EST AFRICAN AMERICAN: 49 mL/min — AB (ref >60)
Glucose, Bld: 127 mg/dL — ABNORMAL HIGH (ref 65–99)
POTASSIUM: 3.6 mmol/L (ref 3.5–5.1)
SODIUM: 139 mmol/L (ref 135–145)
Total Bilirubin: 0.9 mg/dL (ref 0.3–1.2)
Total Protein: 6.6 g/dL (ref 6.5–8.1)

## 2014-10-18 LAB — POC OCCULT BLOOD, ED: Fecal Occult Bld: POSITIVE — AB

## 2014-10-18 LAB — CBC
HCT: 36.3 % (ref 36.0–46.0)
Hemoglobin: 11.5 g/dL — ABNORMAL LOW (ref 12.0–15.0)
MCH: 29.1 pg (ref 26.0–34.0)
MCHC: 31.7 g/dL (ref 30.0–36.0)
MCV: 91.9 fL (ref 78.0–100.0)
PLATELETS: 222 10*3/uL (ref 150–400)
RBC: 3.95 MIL/uL (ref 3.87–5.11)
RDW: 13.8 % (ref 11.5–15.5)
WBC: 6.5 10*3/uL (ref 4.0–10.5)

## 2014-10-18 LAB — TYPE AND SCREEN
ABO/RH(D): O POS
Antibody Screen: NEGATIVE

## 2014-10-18 LAB — ABO/RH: ABO/RH(D): O POS

## 2014-10-18 LAB — TROPONIN I: Troponin I: <0.03 ng/mL (ref <0.031)

## 2014-10-18 MED ORDER — ACETAMINOPHEN 500 MG PO TABS
500.0000 mg | ORAL_TABLET | Freq: Four times a day (QID) | ORAL | Status: DC | PRN
  Administered 2014-10-19 (×2): 500 mg via ORAL
  Filled 2014-10-18 (×2): qty 1

## 2014-10-18 MED ORDER — LORAZEPAM 1 MG PO TABS
1.0000 mg | ORAL_TABLET | Freq: Every day | ORAL | Status: DC
  Administered 2014-10-18 – 2014-10-19 (×2): 1 mg via ORAL
  Filled 2014-10-18 (×2): qty 1

## 2014-10-18 MED ORDER — POTASSIUM CHLORIDE CRYS ER 10 MEQ PO TBCR
10.0000 meq | EXTENDED_RELEASE_TABLET | Freq: Every day | ORAL | Status: DC
  Administered 2014-10-18 – 2014-10-20 (×3): 10 meq via ORAL
  Filled 2014-10-18 (×3): qty 1

## 2014-10-18 MED ORDER — LORATADINE 10 MG PO TABS
10.0000 mg | ORAL_TABLET | Freq: Every day | ORAL | Status: DC
  Administered 2014-10-19 – 2014-10-20 (×2): 10 mg via ORAL
  Filled 2014-10-18 (×3): qty 1

## 2014-10-18 MED ORDER — ONDANSETRON HCL 4 MG/2ML IJ SOLN: 4.0000 mg | Freq: Four times a day (QID) | INTRAMUSCULAR | Status: DC | PRN

## 2014-10-18 MED ORDER — FLUTICASONE PROPIONATE 50 MCG/ACT NA SUSP
2.0000 | NASAL | Status: DC | PRN
  Administered 2014-10-19: 2 via NASAL
  Filled 2014-10-18: qty 16

## 2014-10-18 MED ORDER — CHLORHEXIDINE GLUCONATE 0.12 % MT SOLN
15.0000 mL | Freq: Two times a day (BID) | OROMUCOSAL | Status: DC
Start: 2014-10-18 — End: 2014-10-20
  Administered 2014-10-18 – 2014-10-20 (×4): 15 mL via OROMUCOSAL
  Filled 2014-10-18 (×6): qty 15

## 2014-10-18 MED ORDER — ALBUTEROL SULFATE HFA 108 (90 BASE) MCG/ACT IN AERS: 2.0000 | INHALATION_SPRAY | RESPIRATORY_TRACT | Status: DC | PRN

## 2014-10-18 MED ORDER — ONDANSETRON HCL 4 MG PO TABS: 4.0000 mg | ORAL_TABLET | Freq: Four times a day (QID) | ORAL | Status: DC | PRN

## 2014-10-18 MED ORDER — SIMETHICONE 80 MG PO CHEW
120.0000 mg | CHEWABLE_TABLET | ORAL | Status: DC | PRN
  Filled 2014-10-18: qty 2

## 2014-10-18 MED ORDER — DORZOLAMIDE HCL 2 % OP SOLN
1.0000 [drp] | Freq: Two times a day (BID) | OPHTHALMIC | Status: DC
  Administered 2014-10-18 – 2014-10-20 (×4): 1 [drp] via OPHTHALMIC
  Filled 2014-10-18: qty 10

## 2014-10-18 MED ORDER — FLUTICASONE PROPIONATE HFA 44 MCG/ACT IN AERO
2.0000 | INHALATION_SPRAY | Freq: Two times a day (BID) | RESPIRATORY_TRACT | Status: DC
  Administered 2014-10-18 – 2014-10-20 (×4): 2 via RESPIRATORY_TRACT
  Filled 2014-10-18: qty 10.6

## 2014-10-18 MED ORDER — SODIUM CHLORIDE 0.9 % IJ SOLN
3.0000 mL | Freq: Two times a day (BID) | INTRAMUSCULAR | Status: DC
  Administered 2014-10-18 – 2014-10-19 (×3): 3 mL via INTRAVENOUS

## 2014-10-18 MED ORDER — FERROUS SULFATE 325 (65 FE) MG PO TABS
325.0000 mg | ORAL_TABLET | Freq: Every day | ORAL | Status: DC
  Administered 2014-10-19 – 2014-10-20 (×2): 325 mg via ORAL
  Filled 2014-10-18 (×3): qty 1

## 2014-10-18 MED ORDER — MEMANTINE HCL 10 MG PO TABS
10.0000 mg | ORAL_TABLET | Freq: Two times a day (BID) | ORAL | Status: DC
  Administered 2014-10-18 – 2014-10-20 (×4): 10 mg via ORAL
  Filled 2014-10-18 (×5): qty 1

## 2014-10-18 MED ORDER — ISOSORBIDE MONONITRATE 15 MG HALF TABLET
15.0000 mg | ORAL_TABLET | Freq: Every day | ORAL | Status: DC
  Administered 2014-10-19: 15 mg via ORAL
  Filled 2014-10-18 (×2): qty 1

## 2014-10-18 MED ORDER — PANTOPRAZOLE SODIUM 40 MG IV SOLR
40.0000 mg | Freq: Once | INTRAVENOUS | Status: AC
  Administered 2014-10-18: 40 mg via INTRAVENOUS
  Filled 2014-10-18: qty 40

## 2014-10-18 MED ORDER — ALBUTEROL SULFATE (2.5 MG/3ML) 0.083% IN NEBU: 2.5000 mg | INHALATION_SOLUTION | RESPIRATORY_TRACT | Status: DC | PRN

## 2014-10-18 MED ORDER — LISINOPRIL 20 MG PO TABS
20.0000 mg | ORAL_TABLET | Freq: Every day | ORAL | Status: DC
  Administered 2014-10-19 – 2014-10-20 (×2): 20 mg via ORAL
  Filled 2014-10-18 (×3): qty 1

## 2014-10-18 MED ORDER — METOPROLOL SUCCINATE ER 50 MG PO TB24
50.0000 mg | ORAL_TABLET | Freq: Every day | ORAL | Status: DC
  Administered 2014-10-19 – 2014-10-20 (×2): 50 mg via ORAL
  Filled 2014-10-18 (×3): qty 1

## 2014-10-18 MED ORDER — IRON 28 MG PO TABS: 1.0000 | ORAL_TABLET | Freq: Every day | ORAL | Status: DC

## 2014-10-18 MED ORDER — PANTOPRAZOLE SODIUM 40 MG IV SOLR
40.0000 mg | Freq: Two times a day (BID) | INTRAVENOUS | Status: DC
  Administered 2014-10-18 – 2014-10-19 (×2): 40 mg via INTRAVENOUS
  Filled 2014-10-18 (×3): qty 40

## 2014-10-18 MED ORDER — DILTIAZEM HCL ER COATED BEADS 120 MG PO CP24
120.0000 mg | ORAL_CAPSULE | ORAL | Status: DC
  Filled 2014-10-18 (×3): qty 1

## 2014-10-18 MED ORDER — CETYLPYRIDINIUM CHLORIDE 0.05 % MT LIQD
7.0000 mL | Freq: Two times a day (BID) | OROMUCOSAL | Status: DC
  Administered 2014-10-19: 7 mL via OROMUCOSAL

## 2014-10-18 MED ORDER — SIMETHICONE 125 MG PO CAPS: 125.0000 mg | ORAL_CAPSULE | ORAL | Status: DC | PRN

## 2014-10-18 MED ORDER — SODIUM CHLORIDE 0.9 % IV SOLN
INTRAVENOUS | Status: DC
  Administered 2014-10-18 – 2014-10-19 (×2): via INTRAVENOUS

## 2014-10-18 MED ORDER — PRAVASTATIN SODIUM 20 MG PO TABS
20.0000 mg | ORAL_TABLET | Freq: Every day | ORAL | Status: DC
  Administered 2014-10-19: 20 mg via ORAL
  Filled 2014-10-18 (×2): qty 1

## 2014-10-18 NOTE — H&P (Signed)
[facility] History and Physical                                                                                    Jasmine [name], is a 81 y.o. female  MRN: [id number]   DOB - 02/08/1925  Admit Date - 10/18/2014  Outpatient Primary MD for the patient is [name], MD  Referring MD: Dr Ashok Cordia / ER  Consulting MD: Dr. Collene Mares / GI  With History of -  Past Medical History  Diagnosis Date  . GERD (gastroesophageal reflux disease)   . Insomnia   . Eczema   . Glaucoma   . HOCM (hypertrophic obstructive cardiomyopathy)     a. s/p septal myomectomy;  b. 06/2014 Echo: EF 50-55%, mild antsept HK, mild MR, mild MS/MR (bioprosth valve), mildly dil LA, mod TR, PASP 34mmHg.  . High cholesterol   . Non-obstructive coronary artery disease     a. 06/2014 NSTEMI/Cath: LM nl, LAD nl, LCX nl, RCA 10-38m, EF 45%.  . Blood transfusion   . Chronic kidney disease (CKD), stage III (moderate)   . History of Severe Mitral Regurgitation     a. s/p MVR with pericardial tissue valve  . Diverticulosis of colon with hemorrhage 08/12/2012  . History of GI Bleed   . Asthma   . Hypertension   . Reflux esophagitis   . Pulmonary HTN     a. mild to moderate (PASP 44mmHg by echo 06/2014).  Marland Kitchen Aortic insufficiency     a. Not seen on echo 06/2014.  . Bradycardia     a. 10/2011 s/p SJM Accent DR DC PPM, ser #: [id number].  . Atrial fibrillation and flutter     a. atrial fibrillation s/p DCCV 06-2012;  b. now in chronic atrial fibrillation off anticoagulation due to GI bleed.  . Paroxysmal atrial tachycardia   . PVCs (premature ventricular contractions)   . DCM (dilated cardiomyopathy)     a. 50% echo 06/2012;  b. 06/2014 Echo: EF 50-55%.  Marland Kitchen Heart murmur   . Dementia   . Atypical chest pain       Past Surgical History  Procedure Laterality Date  . Cardiac catheterization    . Cardiac valve replacement  2007    MVR  . Myomectomy  2007    septal  . Tonsillectomy    . Appendectomy    . Cholecystectomy      . Tubal ligation    . Hernia repair      "in my stomach"  . Cataract extraction w/ intraocular lens  implant, bilateral    . Cardioversion  07/04/2012    Procedure: CARDIOVERSION;  Surgeon: Sueanne Margarita, MD;  Location: [place];  Service: Cardiovascular;  Laterality: N/A;  h/p in file drawer   . Colonoscopy    . Colon resection    . Colonoscopy N/A 08/13/2012    Procedure: COLONOSCOPY;  Surgeon: Gatha Mayer, MD;  Location: [place];  Service: Endoscopy;  Laterality: N/A;  . Permanent pacemaker insertion N/A 10/07/2011    Procedure: PERMANENT PACEMAKER INSERTION;  Surgeon: Deboraha Sprang, MD;  Location: [facility] CATH LAB;  Service: Cardiovascular;  Laterality: N/A;  .  Left heart catheterization with coronary angiogram N/A 06/11/2014    Procedure: LEFT HEART CATHETERIZATION WITH CORONARY ANGIOGRAM;  Surgeon: Troy Sine, MD;  Location: [facility] CATH LAB;  Service: Cardiovascular;  Laterality: N/A;    in for   Chief Complaint  Patient presents with  . Abdominal Pain  . Rectal Bleeding     HPI This is a 81 year old female patient with multiple chronic medical problems including chronic atrial fibrillation not on anticoagulation secondary to recurrent GI bleeding, IHSS with history of systolic dysfunction improved on medications last EF 50%, atypical and chronic ongoing chest pain, pulmonary hypertension, known diverticulosis with prior hemorrhage, pacemaker, GERD, stage III chronic kidney disease, nonobstructive CAD noted on cardiac catheterization Jan 2016. Patient presents to the ER with rectal bleeding 2 days. The bleeding started as reddish in color and subsequently his transitioned to dark very loose stools. On 5/12 patient saw her GI /Dr. Collene Mares who recommended she stop her regular baby aspirin. Patient has been symptomatic with the bleeding complaining of weakness and fatigue as well as dyspnea on exertion. She has a discomfort at the bottom of her throat that relates to her abdomen. She's  not had any nausea or vomiting. She's had mild crampy abdominal pain.  In the ER she was hemodynamically stable, in atrial fibrillation with controlled ventricular response, BP was 142/68, she was not orthostatic when checked, room air sats were 100%. Labs were stable and within baseline for patient, hemoglobin stable at 11.1. Fecal occult blood was grossly positive.   Review of Systems   In addition to the HPI above,  No Fever-chills, myalgias or other constitutional symptoms No Headache, changes with Vision or hearing, new weakness, tingling, numbness in any extremity, No problems swallowing food or Liquids No Cough or Shortness of Breath, palpitations, orthopnea  No ,N/V-Bowel movements are loose No dysuria, hematuria or flank pain No new skin rashes, lesions, masses or bruises, No new joints pains-aches No recent weight gain or loss No polyuria, polydypsia or polyphagia,  *A full 10 point Review of Systems was done, except as stated above, all other Review of Systems were negative.  Social History History  Substance Use Topics  . Smoking status: Former Smoker    Types: Cigarettes    Quit date: 06/07/1974  . Smokeless tobacco: Former Systems developer    Types: Snuff    Quit date: 04/23/1975     Comment: 10/05/11 "quit smoking & dipping snuff; been some years ago"  . Alcohol Use: No     Comment: 10/05/11 "used to drink; quit a long time ago"    Resides at: Private residence  Lives with: Daughter  Ambulatory status: Ambulatory without assistive devices prior to admission   Family History Family History  Problem Relation Age of Onset  . CVA Mother   . Hypertension Mother   . CVA Brother   . Hypertension Brother      Prior to Admission medications   Medication Sig Start Date End Date Taking? Authorizing Provider  acetaminophen (TYLENOL) 500 MG tablet Take 500 mg by mouth every 6 (six) hours as needed. For pain    Historical Provider, MD  albuterol (PROVENTIL HFA;VENTOLIN HFA)  108 (90 BASE) MCG/ACT inhaler Inhale 2 puffs into the lungs every 4 (four) hours as needed for wheezing or shortness of breath. For shortness of breath    Historical Provider, MD  aspirin 81 MG chewable tablet Chew 1 tablet (81 mg total) by mouth daily. 06/14/14   Melton Alar, PA-C  budesonide (PULMICORT) 180 MCG/ACT inhaler Inhale 2 puffs into the lungs as needed (SOB/Wheezing).     Historical Provider, MD  Calcium Carbonate-Vitamin D (CALTRATE 600+D) 600-400 MG-UNIT per tablet Take 1 tablet by mouth daily.    Historical Provider, MD  cetirizine (ZYRTEC) 10 MG tablet Take 10 mg by mouth at bedtime as needed for allergies. For allergy symptoms    Historical Provider, MD  diltiazem (CARDIZEM CD) 120 MG 24 hr capsule Take 1 capsule (120 mg total) by mouth daily. 06/26/14   Charlynne Cousins, MD  dorzolamide (TRUSOPT) 2 % ophthalmic solution Place 1 drop into both eyes 2 (two) times daily.    Historical Provider, MD  Ferrous Sulfate (IRON) 28 MG TABS Take 1 tablet by mouth daily.    Historical Provider, MD  fluticasone (FLONASE) 50 MCG/ACT nasal spray Place into both nostrils as needed for allergies or rhinitis.    Historical Provider, MD  isosorbide mononitrate (IMDUR) 30 MG 24 hr tablet Take 0.5 tablets (15 mg total) by mouth daily. 06/26/14   Charlynne Cousins, MD  lisinopril (PRINIVIL,ZESTRIL) 20 MG tablet Take 20 mg by mouth daily. 06/17/14   Historical Provider, MD  LORazepam (ATIVAN) 1 MG tablet Take 1 mg by mouth at bedtime.     Historical Provider, MD  lovastatin (MEVACOR) 20 MG tablet Take 20 mg by mouth at bedtime.    Historical Provider, MD  memantine (NAMENDA) 10 MG tablet Take 10 mg by mouth 2 (two) times daily.    Historical Provider, MD  metoprolol succinate (TOPROL-XL) 50 MG 24 hr tablet Take 1 tablet (50 mg total) by mouth daily. Take with or immediately following a meal. 07/02/14   Rogelia Mire, NP  Multiple Vitamins-Minerals (ALIVE WOMENS 50+ PO) Take by mouth daily.     Historical Provider, MD  nitroGLYCERIN (NITROSTAT) 0.4 MG SL tablet Place 1 tablet (0.4 mg total) under the tongue every 5 (five) minutes as needed for chest pain. 06/14/14   Bobby Rumpf York, PA-C  pantoprazole (PROTONIX) 40 MG tablet Take 1 tablet (40 mg total) by mouth 2 (two) times daily before a meal. 06/26/14   Charlynne Cousins, MD  potassium chloride (K-DUR,KLOR-CON) 10 MEQ tablet Take 1 tablet (10 mEq total) by mouth daily. 06/26/14   Charlynne Cousins, MD  Simethicone (GAS-X EXTRA STRENGTH) 125 MG CAPS Take 125 mg by mouth as needed (gas).     Historical Provider, MD  vitamin E 400 UNIT capsule Take 400 Units by mouth daily.    Historical Provider, MD    Allergies  Allergen Reactions  . Cymbalta [Duloxetine Hcl]     Diarrhea, weakness    Physical Exam  Vitals  Blood pressure 142/68, pulse 54, temperature 97.6 F (36.4 C), resp. rate 16, SpO2 100 %.   General:  In no acute distress, appears healthy and well nourished  Psych:  Normal affect, Denies Suicidal or Homicidal ideations, Awake Alert, Oriented X 3. Speech and thought patterns are clear and appropriate, no apparent short term memory deficits  Neuro:   No focal neurological deficits, CN II through XII intact, Strength 5/5 all 4 extremities, Sensation intact all 4 extremities.  ENT:  Ears and Eyes appear Normal, Conjunctivae clear, PER. Moist oral mucosa without erythema or exudates.  Neck:  Supple, No lymphadenopathy appreciated  Respiratory:  Symmetrical chest wall movement, Good air movement bilaterally, CTAB. Room Air  Cardiac:  RRR, No Murmurs, no LE edema noted, no JVD, No carotid bruits, peripheral pulses palpable  at 2+  Abdomen:  Positive for hyperactive bowel sounds, Soft, Non tender, Non distended,  No masses appreciated, no obvious hepatosplenomegaly  Skin:  No Cyanosis, Normal Skin Turgor, No Skin Rash or Bruise.  Extremities: Symmetrical without obvious trauma or injury,  no effusions. Somewhat cool  to the touch in hands and feet  Data Review  CBC  Recent Labs Lab 10/18/14 1515  WBC 6.5  HGB 11.5*  HCT 36.3  PLT 222  MCV 91.9  MCH 29.1  MCHC 31.7  RDW 13.8    Chemistries   Recent Labs Lab 10/18/14 1515  NA 139  K 3.6  CL 107  CO2 24  GLUCOSE 127*  BUN 8  CREATININE 1.13*  CALCIUM 8.9  AST 22  ALT 16  ALKPHOS 45  BILITOT 0.9    CrCl cannot be calculated (Unknown ideal weight.).  No results for input(s): TSH, T4TOTAL, T3FREE, THYROIDAB in the last 72 hours.  Invalid input(s): FREET3  Coagulation profile No results for input(s): INR, PROTIME in the last 168 hours.  No results for input(s): DDIMER in the last 72 hours.  Cardiac Enzymes No results for input(s): CKMB, TROPONINI, MYOGLOBIN in the last 168 hours.  Invalid input(s): CK  Invalid input(s): POCBNP  Urinalysis    Component Value Date/Time   COLORURINE YELLOW 06/12/2014 1825   APPEARANCEUR CLEAR 06/12/2014 1825   LABSPEC 1.024 06/12/2014 1825   PHURINE 5.5 06/12/2014 1825   GLUCOSEU NEGATIVE 06/12/2014 1825   HGBUR NEGATIVE 06/12/2014 1825   BILIRUBINUR NEGATIVE 06/12/2014 1825   KETONESUR NEGATIVE 06/12/2014 1825   PROTEINUR NEGATIVE 06/12/2014 1825   UROBILINOGEN 0.2 06/12/2014 1825   NITRITE NEGATIVE 06/12/2014 1825   LEUKOCYTESUR NEGATIVE 06/12/2014 1825    Imaging results:   No results found.   Assessment & Plan  Principal Problem:   GI bleed/ History of Diverticulosis of colon with hemorrhage/chronic anemia -Admit to telemetry -Hemoglobin stable and no signs of bright red blood per rectum; cycle CBC every 12 hours next due at 3 AM -Spoke with Dr. Collene Mares of gastroenterology-agrees with clear liquid diet and at this juncture no indication to make patient nothing by mouth after midnight  Active Problems:   IHSS (idiopathic hypertrophic subaortic stenosis) -Possible explanation for patient's ongoing chronic chest pain -Last outpatient cardiology evaluation April  2016 -Cont Imdur and ACE inhibitor    Gastroesophageal reflux disease without esophagitis -Continue twice a day Protonix    Chronic kidney disease (CKD), stage III (moderate) -Renal function stable and at baseline    Pacemaker-St.Jude/Chronic atrial fibrillation -Remains rate controlled on beta blocker and calcium channel blocker -Not chronically anticoagulated secondary to recurrent GI bleeds -CHADVASc = 5    Pulmonary HTN -Continue Imdur and ACE inhibitor    Non-obstructive coronary artery disease/atypical chest pain -Cardiac catheterization  Jan 2016 -Currently having atypical chest discomfort possibly GI in etiology but does have a history of demand ischemia so as precaution we'll cycle cardiac enzymes    DVT Prophylaxis: SCDs  Family Communication: No family at bedside    Code Status:  Full code  Condition:  Stable  Discharge disposition: Anticipate will return to home once gastroenterology evaluation complete and no further GI bleeding observed  Time spent in minutes : 60      Ajwa Kimberley L. ANP on 10/18/2014 at 5:00 PM  Between 7am to 7pm - Pager - [phone]  After 7pm go to [email or web address] - password [email or web address]  And look for the night coverage person covering me after hours  [place]

## 2014-10-18 NOTE — Progress Notes (Signed)
Patient trasfered from ED to [phone] via stretcher; alert and oriented x 4; no complaints of pain; IV  in RH  running fluids@75cc /hr; skin intact. Orient patient to room and unit; gave patient care guide; instructed how to use the call bell and  fall risk precautions. Will continue to monitor the patient.

## 2014-10-18 NOTE — ED Provider Notes (Signed)
CSN: [id number]     Arrival date & time 10/18/14  1456 History   First MD Initiated Contact with Patient 10/18/14 1521     Chief Complaint  Patient presents with  . Abdominal Pain  . Rectal Bleeding     (Consider location/radiation/quality/duration/timing/severity/associated sxs/prior Treatment) Patient is a 81 y.o. female presenting with abdominal pain and hematochezia. The history is provided by the patient.  Abdominal Pain Associated symptoms: hematochezia   Associated symptoms: no chest pain, no chills, no fever, no hematuria, no shortness of breath and no vaginal bleeding   Rectal Bleeding Associated symptoms: abdominal pain and light-headedness   Associated symptoms: no epistaxis and no fever   Patient with hx colon diverticula, prior gi bleed from same, c/o 3-4 episodes dark blood per rectum in the past couple days. Moderate amount. No specific exacerbating or alleviating factors. No rectal pain or trauma. No recent diarrhea or nv. No abd pain or distension. No recent wt loss. Normal appetite. +generalized weakness, mild lightheadedness when stands. No fever or chills.      Past Medical History  Diagnosis Date  . GERD (gastroesophageal reflux disease)   . Insomnia   . Eczema   . Glaucoma   . HOCM (hypertrophic obstructive cardiomyopathy)     a. s/p septal myomectomy;  b. 06/2014 Echo: EF 50-55%, mild antsept HK, mild MR, mild MS/MR (bioprosth valve), mildly dil LA, mod TR, PASP 56mmHg.  . High cholesterol   . Non-obstructive coronary artery disease     a. 06/2014 NSTEMI/Cath: LM nl, LAD nl, LCX nl, RCA 10-56m, EF 45%.  . Blood transfusion   . Chronic kidney disease (CKD), stage III (moderate)   . History of Severe Mitral Regurgitation     a. s/p MVR with pericardial tissue valve  . Diverticulosis of colon with hemorrhage 08/12/2012  . History of GI Bleed   . Asthma   . Hypertension   . Reflux esophagitis   . Pulmonary HTN     a. mild to moderate (PASP 76mmHg by echo  06/2014).  Marland Kitchen Aortic insufficiency     a. Not seen on echo 06/2014.  . Bradycardia     a. 10/2011 s/p SJM Accent DR DC PPM, ser #: [id number].  . Atrial fibrillation and flutter     a. atrial fibrillation s/p DCCV 06-2012;  b. now in chronic atrial fibrillation off anticoagulation due to GI bleed.  . Paroxysmal atrial tachycardia   . PVCs (premature ventricular contractions)   . DCM (dilated cardiomyopathy)     a. 50% echo 06/2012;  b. 06/2014 Echo: EF 50-55%.  Marland Kitchen Heart murmur   . Dementia   . Atypical chest pain    Past Surgical History  Procedure Laterality Date  . Cardiac catheterization    . Cardiac valve replacement  2007    MVR  . Myomectomy  2007    septal  . Tonsillectomy    . Appendectomy    . Cholecystectomy    . Tubal ligation    . Hernia repair      "in my stomach"  . Cataract extraction w/ intraocular lens  implant, bilateral    . Cardioversion  07/04/2012    Procedure: CARDIOVERSION;  Surgeon: Sueanne Margarita, MD;  Location: [place];  Service: Cardiovascular;  Laterality: N/A;  h/p in file drawer   . Colonoscopy    . Colon resection    . Colonoscopy N/A 08/13/2012    Procedure: COLONOSCOPY;  Surgeon: Gatha Mayer, MD;  Location:  [place] ENDOSCOPY;  Service: Endoscopy;  Laterality: N/A;  . Permanent pacemaker insertion N/A 10/07/2011    Procedure: PERMANENT PACEMAKER INSERTION;  Surgeon: Deboraha Sprang, MD;  Location: [facility] CATH LAB;  Service: Cardiovascular;  Laterality: N/A;  . Left heart catheterization with coronary angiogram N/A 06/11/2014    Procedure: LEFT HEART CATHETERIZATION WITH CORONARY ANGIOGRAM;  Surgeon: Troy Sine, MD;  Location: [facility] CATH LAB;  Service: Cardiovascular;  Laterality: N/A;   Family History  Problem Relation Age of Onset  . CVA Mother   . Hypertension Mother   . CVA Brother   . Hypertension Brother    History  Substance Use Topics  . Smoking status: Former Smoker    Types: Cigarettes    Quit date: 06/07/1974  . Smokeless tobacco: Former  Systems developer    Types: Snuff    Quit date: 04/23/1975     Comment: 10/05/11 "quit smoking & dipping snuff; been some years ago"  . Alcohol Use: No     Comment: 10/05/11 "used to drink; quit a long time ago"   OB History    No data available     Review of Systems  Constitutional: Negative for fever and chills.  HENT: Negative for nosebleeds.   Eyes: Negative for redness.  Respiratory: Negative for shortness of breath.   Cardiovascular: Negative for chest pain.  Gastrointestinal: Positive for abdominal pain and hematochezia.  Endocrine: Negative for polyuria.  Genitourinary: Negative for hematuria, flank pain and vaginal bleeding.  Musculoskeletal: Negative for back pain and neck pain.  Skin: Negative for rash.  Neurological: Positive for weakness and light-headedness. Negative for headaches.  Hematological: Does not bruise/bleed easily.  Psychiatric/Behavioral: Negative for confusion.      Allergies  Cymbalta  Home Medications   Prior to Admission medications   Medication Sig Start Date End Date Taking? Authorizing Provider  acetaminophen (TYLENOL) 500 MG tablet Take 500 mg by mouth every 6 (six) hours as needed. For pain    Historical Provider, MD  albuterol (PROVENTIL HFA;VENTOLIN HFA) 108 (90 BASE) MCG/ACT inhaler Inhale 2 puffs into the lungs every 4 (four) hours as needed for wheezing or shortness of breath. For shortness of breath    Historical Provider, MD  aspirin 81 MG chewable tablet Chew 1 tablet (81 mg total) by mouth daily. 06/14/14   Bobby Rumpf York, PA-C  budesonide (PULMICORT) 180 MCG/ACT inhaler Inhale 2 puffs into the lungs as needed (SOB/Wheezing).     Historical Provider, MD  Calcium Carbonate-Vitamin D (CALTRATE 600+D) 600-400 MG-UNIT per tablet Take 1 tablet by mouth daily.    Historical Provider, MD  cetirizine (ZYRTEC) 10 MG tablet Take 10 mg by mouth at bedtime as needed for allergies. For allergy symptoms    Historical Provider, MD  diltiazem (CARDIZEM CD) 120  MG 24 hr capsule Take 1 capsule (120 mg total) by mouth daily. 06/26/14   Charlynne Cousins, MD  dorzolamide (TRUSOPT) 2 % ophthalmic solution Place 1 drop into both eyes 2 (two) times daily.    Historical Provider, MD  Ferrous Sulfate (IRON) 28 MG TABS Take 1 tablet by mouth daily.    Historical Provider, MD  fluticasone (FLONASE) 50 MCG/ACT nasal spray Place into both nostrils as needed for allergies or rhinitis.    Historical Provider, MD  isosorbide mononitrate (IMDUR) 30 MG 24 hr tablet Take 0.5 tablets (15 mg total) by mouth daily. 06/26/14   Charlynne Cousins, MD  lisinopril (PRINIVIL,ZESTRIL) 20 MG tablet Take 20 mg by mouth daily.  06/17/14   Historical Provider, MD  LORazepam (ATIVAN) 1 MG tablet Take 1 mg by mouth at bedtime.     Historical Provider, MD  lovastatin (MEVACOR) 20 MG tablet Take 20 mg by mouth at bedtime.    Historical Provider, MD  memantine (NAMENDA) 10 MG tablet Take 10 mg by mouth 2 (two) times daily.    Historical Provider, MD  metoprolol succinate (TOPROL-XL) 50 MG 24 hr tablet Take 1 tablet (50 mg total) by mouth daily. Take with or immediately following a meal. 07/02/14   Rogelia Mire, NP  Multiple Vitamins-Minerals (ALIVE WOMENS 50+ PO) Take by mouth daily.    Historical Provider, MD  nitroGLYCERIN (NITROSTAT) 0.4 MG SL tablet Place 1 tablet (0.4 mg total) under the tongue every 5 (five) minutes as needed for chest pain. 06/14/14   Bobby Rumpf York, PA-C  pantoprazole (PROTONIX) 40 MG tablet Take 1 tablet (40 mg total) by mouth 2 (two) times daily before a meal. 06/26/14   Charlynne Cousins, MD  potassium chloride (K-DUR,KLOR-CON) 10 MEQ tablet Take 1 tablet (10 mEq total) by mouth daily. 06/26/14   Charlynne Cousins, MD  Simethicone (GAS-X EXTRA STRENGTH) 125 MG CAPS Take 125 mg by mouth as needed (gas).     Historical Provider, MD  vitamin E 400 UNIT capsule Take 400 Units by mouth daily.    Historical Provider, MD   BP 144/83 mmHg  Pulse 93  Temp(Src) 97.6  F (36.4 C)  Resp 18  SpO2 98% Physical Exam  Constitutional: She appears well-developed and well-nourished. No distress.  HENT:  Mouth/Throat: Oropharynx is clear and moist.  Eyes: No scleral icterus.  Neck: Neck supple. No tracheal deviation present.  Cardiovascular: Normal rate, normal heart sounds and intact distal pulses.   Irregular rhythm  Pulmonary/Chest: Effort normal and breath sounds normal. No respiratory distress.  Abdominal: Soft. Normal appearance. She exhibits no distension and no mass. There is no tenderness. There is no rebound and no guarding.  Genitourinary: Guaiac positive stool.  No mass. No hemorrhoids. Reddish brown liquid stool, heme pos.   Musculoskeletal: She exhibits no edema.  Neurological: She is alert.  Skin: Skin is warm and dry. No rash noted. She is not diaphoretic. There is pallor.  Psychiatric: She has a normal mood and affect.  Nursing note and vitals reviewed.   ED Course  Procedures (including critical care time) Labs Review   Results for orders placed or performed during the hospital encounter of 10/18/14  CBC  Result Value Ref Range   WBC 6.5 4.0 - 10.5 K/uL   RBC 3.95 3.87 - 5.11 MIL/uL   Hemoglobin 11.5 (L) 12.0 - 15.0 g/dL   HCT 36.3 36.0 - 46.0 %   MCV 91.9 78.0 - 100.0 fL   MCH 29.1 26.0 - 34.0 pg   MCHC 31.7 30.0 - 36.0 g/dL   RDW 13.8 11.5 - 15.5 %   Platelets 222 150 - 400 K/uL  Comprehensive metabolic panel  Result Value Ref Range   Sodium 139 135 - 145 mmol/L   Potassium 3.6 3.5 - 5.1 mmol/L   Chloride 107 101 - 111 mmol/L   CO2 24 22 - 32 mmol/L   Glucose, Bld 127 (H) 65 - 99 mg/dL   BUN 8 6 - 20 mg/dL   Creatinine, Ser 1.13 (H) 0.44 - 1.00 mg/dL   Calcium 8.9 8.9 - 10.3 mg/dL   Total Protein 6.6 6.5 - 8.1 g/dL   Albumin 3.7 3.5 -  5.0 g/dL   AST 22 15 - 41 U/L   ALT 16 14 - 54 U/L   Alkaline Phosphatase 45 38 - 126 U/L   Total Bilirubin 0.9 0.3 - 1.2 mg/dL   GFR calc non Af Amer 42 (L) >60 mL/min   GFR calc  Af Amer 49 (L) >60 mL/min   Anion gap 8 5 - 15  POC occult blood, ED  Result Value Ref Range   Fecal Occult Bld POSITIVE (A) NEGATIVE  Type and screen  Result Value Ref Range   ABO/RH(D) O POS    Antibody Screen NEG    Sample Expiration 10/21/2014        MDM   Iv ns bolus. protonix iv.   Labs. Monitor.  Reviewed nursing notes and prior charts for additional history.   Given several bloody stools, symptomatic weakness/faintness, will admit to med service.    Lajean Saver, MD 10/18/14 1630

## 2014-10-18 NOTE — ED Provider Notes (Signed)
MSE was initiated and I personally evaluated the patient and placed orders (if any) at  3:16 PM on Oct 18, 2014.  The patient appears stable so that the remainder of the MSE may be completed by another provider.  This chart was scribed for non-physician practitioner working, Monico Blitz, PA-C,  with Noemi Chapel, MD, by Jeanell Sparrow, ED Scribe. This patient was seen in room and the patient's care was started at 3:16 PM.  HPI Comments: Jasmine [name] is a 81 y.o. female who presents to the Emergency Department complaining of constant moderate rectal bleeding that started 2 days ago.  She's not anticoagulated, she takes a low-dose daily aspirin, was advised to DC this by her PCP, last intake was 2 days ago. States that her stool is darker than normal with blood streaking. She's only had 2 bowel movements yesterday. She reports she has also been having generalized weakness with fatigue, chest pain, and SOB. She denies any dizziness.   I personally performed the services described in this documentation, which was scribed in my presence. The recorded information has been reviewed and is accurate.  LSCTA b/l; Heart is RRR; Abd without TTP, guarding or rebound, MAE, goal oriented speech.  I personally performed the services described in this documentation, which was scribed in my presence. The recorded information has been reviewed and is accurate.    Monico Blitz, PA-C 10/18/14 4503  Noemi Chapel, MD 10/18/14 1630

## 2014-10-18 NOTE — ED Notes (Signed)
Pt here for abd pain and dark stools x 2 days. sts very weak.

## 2014-10-18 NOTE — ED Notes (Signed)
Attempted report 

## 2014-10-19 DIAGNOSIS — K922 Gastrointestinal hemorrhage, unspecified: Secondary | ICD-10-CM

## 2014-10-19 DIAGNOSIS — I482 Chronic atrial fibrillation: Secondary | ICD-10-CM

## 2014-10-19 LAB — TROPONIN I: Troponin I: <0.03 ng/mL (ref <0.031)

## 2014-10-19 LAB — CBC
HCT: 32.2 % — ABNORMAL LOW (ref 36.0–46.0)
Hemoglobin: 10.4 g/dL — ABNORMAL LOW (ref 12.0–15.0)
MCH: 29.1 pg (ref 26.0–34.0)
MCHC: 32.3 g/dL (ref 30.0–36.0)
MCV: 89.9 fL (ref 78.0–100.0)
Platelets: 200 10*3/uL (ref 150–400)
RBC: 3.58 MIL/uL — AB (ref 3.87–5.11)
RDW: 13.8 % (ref 11.5–15.5)
WBC: 7 10*3/uL (ref 4.0–10.5)

## 2014-10-19 LAB — BASIC METABOLIC PANEL
ANION GAP: 7 (ref 5–15)
BUN: 7 mg/dL (ref 6–20)
CHLORIDE: 109 mmol/L (ref 101–111)
CO2: 24 mmol/L (ref 22–32)
CREATININE: 0.97 mg/dL (ref 0.44–1.00)
Calcium: 8.4 mg/dL — ABNORMAL LOW (ref 8.9–10.3)
GFR calc Af Amer: 59 mL/min — ABNORMAL LOW (ref >60)
GFR calc non Af Amer: 51 mL/min — ABNORMAL LOW (ref >60)
Glucose, Bld: 87 mg/dL (ref 65–99)
POTASSIUM: 3.8 mmol/L (ref 3.5–5.1)
Sodium: 140 mmol/L (ref 135–145)

## 2014-10-19 LAB — PROTIME-INR
INR: 1.18 (ref 0.00–1.49)
PROTHROMBIN TIME: 15.1 s (ref 11.6–15.2)

## 2014-10-19 LAB — APTT: APTT: 26 s (ref 24–37)

## 2014-10-19 MED ORDER — PANTOPRAZOLE SODIUM 40 MG PO TBEC
40.0000 mg | DELAYED_RELEASE_TABLET | Freq: Two times a day (BID) | ORAL | Status: DC
  Administered 2014-10-19 – 2014-10-20 (×2): 40 mg via ORAL
  Filled 2014-10-19 (×2): qty 1

## 2014-10-19 NOTE — Progress Notes (Signed)
[facility] PROGRESS NOTE  Jasmine [name] ZOX:[id number] DOB: 08/12/1924 DOA: 10/18/2014 PCP: [name], MD  Assessment/Plan:  Principal Problem:   GI bleed Active Problems:   Pacemaker-[employer].Jude   Chronic atrial fibrillation   History of Diverticulosis of colon with hemorrhage   Pulmonary HTN   IHSS (idiopathic hypertrophic subaortic stenosis)   Gastroesophageal reflux disease without esophagitis   Chronic kidney disease (CKD), stage III (moderate)   Non-obstructive coronary artery disease   GIB (gastrointestinal bleeding)  Hemoglobin, vital signs stable. No stools since yesterday. Started out as bright red blood then turned dark. Suspect diverticular bleed has resolved. Will advance diet to soft. Change Protonix to by mouth. Stop IV fluids. Change CBC to every day. No need for endoscopy at this time.  HPI/Subjective: No stool since yesterday. No vomiting. Complains of heartburn type pain on and off for several weeks. Is on proton pump inhibitor at home.  Objective: Filed Vitals:   10/19/14 0537  BP: 151/64  Pulse: 70  Temp: 98.4 F (36.9 C)  Resp: 18    Intake/Output Summary (Last 24 hours) at 10/19/14 1151 Last data filed at 10/19/14 1015  Gross per 24 hour  Intake 1141.25 ml  Output   1500 ml  Net -358.75 ml   Filed Weights   10/18/14 1843 10/19/14 0537  Weight: 73.1 kg (161 lb 2.5 oz) 74.96 kg (165 lb 4.1 oz)    Exam:   General:  In chair. Appears comfortable. Alert and cooperative.  Cardiovascular: Regular rate rhythm without murmurs gallops rubs  Respiratory: Clear to bilaterally without wheezes rhonchi or rales  Abdomen: Soft nontender nondistended  Ext: No clubbing cyanosis or edema  Basic Metabolic Panel:  Recent Labs Lab 10/18/14 1515 10/19/14 0450  NA 139 140  K 3.6 3.8  CL 107 109  CO2 24 24  GLUCOSE 127* 87  BUN 8 7  CREATININE 1.13* 0.97  CALCIUM 8.9 8.4*   Liver Function Tests:  Recent Labs Lab 10/18/14 1515   AST 22  ALT 16  ALKPHOS 45  BILITOT 0.9  PROT 6.6  ALBUMIN 3.7   No results for input(s): LIPASE, AMYLASE in the last 168 hours. No results for input(s): AMMONIA in the last 168 hours. CBC:  Recent Labs Lab 10/18/14 1515 10/19/14 0450  WBC 6.5 7.0  HGB 11.5* 10.4*  HCT 36.3 32.2*  MCV 91.9 89.9  PLT 222 200   Cardiac Enzymes:  Recent Labs Lab 10/18/14 1709 10/18/14 2258 10/19/14 0450  TROPONINI <0.03 <0.03 <0.03   BNP (last 3 results)  Recent Labs  06/10/14 1952 06/23/14 0008  BNP 143.5* 430.5*    ProBNP (last 3 results) No results for input(s): PROBNP in the last 8760 hours.  CBG: No results for input(s): GLUCAP in the last 168 hours.  No results found for this or any previous visit (from the past 240 hour(s)).   Studies: No results found.  Scheduled Meds: . antiseptic oral rinse  7 mL Mouth Rinse q12n4p  . chlorhexidine  15 mL Mouth Rinse BID  . diltiazem  120 mg Oral Q24H  . dorzolamide  1 drop Both Eyes BID  . ferrous sulfate  325 mg Oral Q breakfast  . fluticasone  2 puff Inhalation BID  . isosorbide mononitrate  15 mg Oral Daily  . lisinopril  20 mg Oral Daily  . loratadine  10 mg Oral Daily  . LORazepam  1 mg Oral QHS  . memantine  10 mg Oral BID  . metoprolol  succinate  50 mg Oral Daily  . pantoprazole (PROTONIX) IV  40 mg Intravenous Q12H  . potassium chloride  10 mEq Oral Daily  . pravastatin  20 mg Oral q1800  . sodium chloride  3 mL Intravenous Q12H   Continuous Infusions: . sodium chloride 75 mL/hr at 10/19/14 0514    Time spent: 25 minutes  [facility] Pager [phone] [email or web address], password [facility] 10/19/2014, 11:51 AM  LOS: 1 day

## 2014-10-19 NOTE — Consult Note (Signed)
Reason for Consult: Rectal bleeding. Referring Physician: THP  Jasmine [name] is an 81 y.o. female.  HPI: 81 year old black female with multiple medical issues listed below, was seen in my office on 10/17/14 afor recurrent rectal bleeding, thought to be from diverticulosis. Her hemoglobin was WNL and therefore as she had not had any further bleeding I sent her home with recommendations to go to the hospital if the bleeding recurs. She was admitted to the hospital yesterday and her hemoglobin was 11.5 gm/dl down to 10.4 gms/dl today. She denies having any abdominal pain, nausea, fever, chills, dysphagia or odynophagia.    Past Medical History  Diagnosis Date  . GERD (gastroesophageal reflux disease)   . Insomnia   . Eczema   . Glaucoma   . HOCM (hypertrophic obstructive cardiomyopathy)     a. s/p septal myomectomy;  b. 06/2014 Echo: EF 50-55%, mild antsept HK, mild MR, mild MS/MR (bioprosth valve), mildly dil LA, mod TR, PASP 80mHg.  . High cholesterol   . Non-obstructive coronary artery disease     a. 06/2014 NSTEMI/Cath: LM nl, LAD nl, LCX nl, RCA 10-213mEF 45%.  . Blood transfusion   . Chronic kidney disease (CKD), stage III (moderate)   . History of Severe Mitral Regurgitation     a. s/p MVR with pericardial tissue valve  . Diverticulosis of colon with hemorrhage 08/12/2012  . History of GI Bleed   . Asthma   . Hypertension   . Reflux esophagitis   . Pulmonary HTN     a. mild to moderate (PASP 3240m by echo 06/2014).  . AMarland Kitchenrtic insufficiency     a. Not seen on echo 06/2014.  . Bradycardia     a. 10/2011 s/p SJM Accent DR DC PPM, ser #: [id number]. Atrial fibrillation and flutter     a. atrial fibrillation s/p DCCV 06-2012;  b. now in chronic atrial fibrillation off anticoagulation due to GI bleed.  . Paroxysmal atrial tachycardia   . PVCs (premature ventricular contractions)   . DCM (dilated cardiomyopathy)     a. 50% echo 06/2012;  b. 06/2014 Echo: EF 50-55%.  . HMarland Kitchenart murmur   .  Dementia   . Atypical chest pain    Past Surgical History  Procedure Laterality Date  . Cardiac catheterization    . Cardiac valve replacement  2007    MVR  . Myomectomy  2007    septal  . Tonsillectomy    . Appendectomy    . Cholecystectomy    . Tubal ligation    . Hernia repair      "in my stomach"  . Cataract extraction w/ intraocular lens  implant, bilateral    . Cardioversion  07/04/2012    Procedure: CARDIOVERSION;  Surgeon: TraSueanne MargaritaD;  [facility]  Laterality: N/A;  h/p in file drawer   . Colonoscopy    . Colon resection    . Colonoscopy N/A 08/13/2012    Procedure: COLONOSCOPY;  Surgeon: CarGatha MayerD;  [facility];  Laterality: N/A;  . Permanent pacemaker insertion N/A 10/07/2011    Procedure: PERMANENT PACEMAKER INSERTION;  Surgeon: SteDeboraha SprangD;  [facility];  Service: Cardiovascular;  Laterality: N/A;  . Left heart catheterization with coronary angiogram N/A 06/11/2014    Procedure: LEFT HEART CATHETERIZATION WITH CORONARY ANGIOGRAM;  Surgeon: ThoTroy SineD;  [facility];  Service: Cardiovascular;  Laterality:  N/A;    Family History  Problem Relation Age of Onset  . CVA Mother   . Hypertension Mother   . CVA Brother   . Hypertension Brother    Social History:  reports that she quit smoking about 40 years ago. Her smoking use included Cigarettes. She quit smokeless tobacco use about 39 years ago. Her smokeless tobacco use included Snuff. She reports that she does not drink alcohol or use illicit drugs.  Allergies:  Allergies  Allergen Reactions  . Cymbalta [Duloxetine Hcl]     Diarrhea, weakness   Medications: I have reviewed the patient's current medications.  Results for orders placed or performed during the hospital encounter of 10/18/14 (from the past 48 hour(s))  CBC     Status: Abnormal   Collection Time: 10/18/14  3:15 PM  Result Value Ref  Range   WBC 6.5 4.0 - 10.5 K/uL   RBC 3.95 3.87 - 5.11 MIL/uL   Hemoglobin 11.5 (L) 12.0 - 15.0 g/dL   HCT 36.3 36.0 - 46.0 %   MCV 91.9 78.0 - 100.0 fL   MCH 29.1 26.0 - 34.0 pg   MCHC 31.7 30.0 - 36.0 g/dL   RDW 13.8 11.5 - 15.5 %   Platelets 222 150 - 400 K/uL  Comprehensive metabolic panel     Status: Abnormal   Collection Time: 10/18/14  3:15 PM  Result Value Ref Range   Sodium 139 135 - 145 mmol/L   Potassium 3.6 3.5 - 5.1 mmol/L   Chloride 107 101 - 111 mmol/L   CO2 24 22 - 32 mmol/L   Glucose, Bld 127 (H) 65 - 99 mg/dL   BUN 8 6 - 20 mg/dL   Creatinine, Ser 1.13 (H) 0.44 - 1.00 mg/dL   Calcium 8.9 8.9 - 10.3 mg/dL   Total Protein 6.6 6.5 - 8.1 g/dL   Albumin 3.7 3.5 - 5.0 g/dL   AST 22 15 - 41 U/L   ALT 16 14 - 54 U/L   Alkaline Phosphatase 45 38 - 126 U/L   Total Bilirubin 0.9 0.3 - 1.2 mg/dL   GFR calc non Af Amer 42 (L) >60 mL/min   GFR calc Af Amer 49 (L) >60 mL/min    Comment: (NOTE) The eGFR has been calculated using the CKD EPI equation. This calculation has not been validated in all clinical situations. eGFR's persistently <60 mL/min signify possible Chronic Kidney Disease.    Anion gap 8 5 - 15  Type and screen     Status: None   Collection Time: 10/18/14  3:15 PM  Result Value Ref Range   ABO/RH(D) O POS    Antibody Screen NEG    Sample Expiration 10/21/2014   ABO/Rh     Status: None   Collection Time: 10/18/14  3:15 PM  Result Value Ref Range   ABO/RH(D) O POS   POC occult blood, ED     Status: Abnormal   Collection Time: 10/18/14  3:42 PM  Result Value Ref Range   Fecal Occult Bld POSITIVE (A) NEGATIVE  Troponin I (q 6hr x 3)     Status: None   Collection Time: 10/18/14  5:09 PM  Result Value Ref Range   Troponin I <0.03 <0.031 ng/mL    Comment:        NO INDICATION OF MYOCARDIAL INJURY.   Troponin I (q 6hr x 3)     Status: None   Collection Time: 10/18/14 10:58 PM  Result Value Ref Range  Troponin I <0.03 <0.031 ng/mL    Comment:         NO INDICATION OF MYOCARDIAL INJURY.   Troponin I (q 6hr x 3)     Status: None   Collection Time: 10/19/14  4:50 AM  Result Value Ref Range   Troponin I <0.03 <0.031 ng/mL    Comment:        NO INDICATION OF MYOCARDIAL INJURY.   CBC     Status: Abnormal   Collection Time: 10/19/14  4:50 AM  Result Value Ref Range   WBC 7.0 4.0 - 10.5 K/uL   RBC 3.58 (L) 3.87 - 5.11 MIL/uL   Hemoglobin 10.4 (L) 12.0 - 15.0 g/dL   HCT 32.2 (L) 36.0 - 46.0 %   MCV 89.9 78.0 - 100.0 fL   MCH 29.1 26.0 - 34.0 pg   MCHC 32.3 30.0 - 36.0 g/dL   RDW 13.8 11.5 - 15.5 %   Platelets 200 150 - 400 K/uL  Basic metabolic panel     Status: Abnormal   Collection Time: 10/19/14  4:50 AM  Result Value Ref Range   Sodium 140 135 - 145 mmol/L   Potassium 3.8 3.5 - 5.1 mmol/L   Chloride 109 101 - 111 mmol/L   CO2 24 22 - 32 mmol/L   Glucose, Bld 87 65 - 99 mg/dL   BUN 7 6 - 20 mg/dL   Creatinine, Ser 0.97 0.44 - 1.00 mg/dL   Calcium 8.4 (L) 8.9 - 10.3 mg/dL   GFR calc non Af Amer 51 (L) >60 mL/min   GFR calc Af Amer 59 (L) >60 mL/min    Comment: (NOTE) The eGFR has been calculated using the CKD EPI equation. This calculation has not been validated in all clinical situations. eGFR's persistently <60 mL/min signify possible Chronic Kidney Disease.    Anion gap 7 5 - 15  Protime-INR     Status: None   Collection Time: 10/19/14  4:50 AM  Result Value Ref Range   Prothrombin Time 15.1 11.6 - 15.2 seconds   INR 1.18 0.00 - 1.49  APTT     Status: None   Collection Time: 10/19/14  4:50 AM  Result Value Ref Range   aPTT 26 24 - 37 seconds  Review of Systems  Constitutional: Negative.   HENT: Negative.   Eyes: Negative.   Respiratory: Negative.   Gastrointestinal: Positive for heartburn, constipation and blood in stool. Negative for nausea, vomiting and abdominal pain.  Genitourinary: Negative.   Musculoskeletal: Positive for joint pain.  Skin: Negative.   Neurological: Negative.    Endo/Heme/Allergies: Negative.   Psychiatric/Behavioral: Negative.    Blood pressure 151/64, pulse 70, temperature 98.4 F (36.9 C), temperature source Oral, resp. rate 18, height _0  (1.727 m), weight 74.96 kg (165 lb 4.1 oz), SpO2 99 %. Physical Exam  Constitutional: She is oriented to person, place, and time. She appears well-developed and well-nourished.  HENT:  Head: Normocephalic and atraumatic.  Eyes: Conjunctivae and EOM are normal. Pupils are equal, round, and reactive to light.  Neck: Normal range of motion. Neck supple.  Cardiovascular: An irregularly irregular rhythm present.  Respiratory: Effort normal and breath sounds normal.  GI: Bowel sounds are normal.  Musculoskeletal: Normal range of motion.  Neurological: She is alert and oriented to person, place, and time.  Skin: Skin is warm and dry.  Psychiatric: She has a normal mood and affect. Her behavior is normal. Judgment and thought content normal.   Assessment/Plan:  1) Rectal bleeding with anemia-most likely due to diverticulosis. No further bleeding since admission. She has passed some old heme today. I think if she is hemodynamically stable, she can be discharged tomorrow. 2) GERD on PPI's.  3) Chronic atrial fibrillation.  4) Pulmonary HTN/IHSS/CAD. 5) Chronic kidney disease. Shyteria Lewis 10/19/2014, 11:39 AM

## 2014-10-20 LAB — CBC
HEMATOCRIT: 31.9 % — AB (ref 36.0–46.0)
Hemoglobin: 10.5 g/dL — ABNORMAL LOW (ref 12.0–15.0)
MCH: 29.7 pg (ref 26.0–34.0)
MCHC: 32.9 g/dL (ref 30.0–36.0)
MCV: 90.4 fL (ref 78.0–100.0)
Platelets: 202 10*3/uL (ref 150–400)
RBC: 3.53 MIL/uL — ABNORMAL LOW (ref 3.87–5.11)
RDW: 13.8 % (ref 11.5–15.5)
WBC: 6.3 10*3/uL (ref 4.0–10.5)

## 2014-10-20 MED ORDER — FERROUS SULFATE 325 (65 FE) MG PO TABS: 325.0000 mg | ORAL_TABLET | Freq: Every day | ORAL | Status: AC

## 2014-10-20 NOTE — Progress Notes (Signed)
Nsg Discharge Note  Admit Date:  10/18/2014 Discharge date: 10/20/2014   Jasmine [name] to be D/C'd Home per MD order.  AVS completed.  Copy for chart, and copy for patient signed, and dated. Patient/caregiver able to verbalize understanding.  Discharge Medication:   Medication List    STOP taking these medications        aspirin 81 MG chewable tablet      TAKE these medications        acetaminophen 500 MG tablet  Commonly known as:  TYLENOL  Take 500 mg by mouth every 6 (six) hours as needed. For pain     albuterol 108 (90 BASE) MCG/ACT inhaler  Commonly known as:  PROVENTIL HFA;VENTOLIN HFA  Inhale 2 puffs into the lungs every 4 (four) hours as needed for wheezing or shortness of breath. For shortness of breath     ALIGN PO  Take 1 tablet by mouth daily.     ALIVE WOMENS 50+ PO  Take by mouth daily.     budesonide 180 MCG/ACT inhaler  Commonly known as:  PULMICORT  Inhale 2 puffs into the lungs as needed (SOB/Wheezing).     CALTRATE 600+D 600-400 MG-UNIT per tablet  Generic drug:  Calcium Carbonate-Vitamin D  Take 1 tablet by mouth daily.     diltiazem 120 MG 24 hr capsule  Commonly known as:  CARDIZEM CD  Take 1 capsule (120 mg total) by mouth daily.     dorzolamide-timolol 22.3-6.8 MG/ML ophthalmic solution  Commonly known as:  COSOPT  Place 1 drop into both eyes 2 (two) times daily.     ferrous sulfate 325 (65 FE) MG tablet  Take 1 tablet (325 mg total) by mouth daily with breakfast.     fluticasone 50 MCG/ACT nasal spray  Commonly known as:  FLONASE  Place into both nostrils as needed for allergies or rhinitis.     GAS-X EXTRA STRENGTH 125 MG Caps  Generic drug:  Simethicone  Take 125 mg by mouth as needed (gas).     hydrocortisone cream 1 %  Apply 1 application topically daily.     lisinopril 20 MG tablet  Commonly known as:  PRINIVIL,ZESTRIL  Take 20 mg by mouth daily.     loratadine 10 MG tablet  Commonly known as:  CLARITIN  Take 10 mg by  mouth daily.     LORazepam 1 MG tablet  Commonly known as:  ATIVAN  Take 1 mg by mouth at bedtime.     lovastatin 20 MG tablet  Commonly known as:  MEVACOR  Take 20 mg by mouth at bedtime.     memantine 10 MG tablet  Commonly known as:  NAMENDA  Take 10 mg by mouth 2 (two) times daily.     metoprolol succinate 50 MG 24 hr tablet  Commonly known as:  TOPROL-XL  Take 1 tablet (50 mg total) by mouth daily. Take with or immediately following a meal.     nitroGLYCERIN 0.4 MG SL tablet  Commonly known as:  NITROSTAT  Place 1 tablet (0.4 mg total) under the tongue every 5 (five) minutes as needed for chest pain.     pantoprazole 40 MG tablet  Commonly known as:  PROTONIX  Take 1 tablet (40 mg total) by mouth 2 (two) times daily before a meal.     potassium chloride 10 MEQ tablet  Commonly known as:  K-DUR,KLOR-CON  Take 1 tablet (10 mEq total) by mouth daily.  vitamin E 400 UNIT capsule  Take 400 Units by mouth daily.        Discharge Assessment: Filed Vitals:   10/20/14 0503  BP: 145/61  Pulse: 109  Temp: 98.3 F (36.8 C)  Resp: 18   Skin clean, dry and intact without evidence of skin break down, no evidence of skin tears noted. IV catheter discontinued intact. Site without signs and symptoms of complications - no redness or edema noted at insertion site, patient denies c/o pain - only slight tenderness at site.  Dressing with slight pressure applied.  D/c Instructions-Education: Discharge instructions given to patient/family with verbalized understanding. D/c education completed with patient/family including follow up instructions, medication list, d/c activities limitations if indicated, with other d/c instructions as indicated by MD - patient able to verbalize understanding, all questions fully answered. Patient instructed to return to ED, call 911, or call MD for any changes in condition.  Patient escorted via [place], and D/C home via private auto.  Dayle Points, RN 10/20/2014 2:45 PM

## 2014-10-20 NOTE — Discharge Summary (Signed)
Physician Discharge Summary  Jasmine [name] TMH:[id number] DOB: Oct 21, 1924 DOA: 10/18/2014  PCP: [name], MD  Admit date: 10/18/2014 Discharge date: 10/20/2014  Time spent: greater than 30 minutes  Recommendations for Outpatient Follow-up:  1. Monitor h/h 2. Aspirin stopped  Discharge Diagnoses:  Principal Problem:   GI bleed, likely diverticular Active Problems:   Pacemaker-St.Jude   Chronic atrial fibrillation   History of Diverticulosis of colon with hemorrhage   Pulmonary HTN   IHSS (idiopathic hypertrophic subaortic stenosis)   Gastroesophageal reflux disease without esophagitis   Chronic kidney disease (CKD), stage III (moderate)   Non-obstructive coronary artery disease   GIB (gastrointestinal bleeding)   Discharge Condition: stable  Diet recommendation: heart healthy  Filed Weights   10/18/14 1843 10/19/14 0537  Weight: 73.1 kg (161 lb 2.5 oz) 74.96 kg (165 lb 4.1 oz)    History of present illness:  81 year old female with a history of hypertrophic obstructive myopathy, bile prostatic mitral valve, nonobstructive coronary artery disease, CKD stage III, persistent nature fibrillation, diverticular bleed presented with 2 day history of hematochezia. The patient was seen by her gastroenterologist, Dr. Collene Mares, on 10/17/14 who told the patient stopped aspirin, and come to the hospital if she continued to have hematochezia. The patient has had 4-5 bloody bowel movements in the past 5 days. The patient began experiencing chest discomfort with dizziness and generalized weakness. Gastroenterology was consulted. Hemoglobin in the emergency department was 11.5 which is around her usual baseline. Suspect diverticular bleed vs ischemic colitis. EKG and troponins will be obtained for the patient's chest pain although this is atypical and may be partly musculoskeletal as it is reproducible. Previous heart catheterization 06/11/2014 showed nonobstructive coronary artery disease.    Hospital Course:  Admitted to medsurg. Aspirin stopped. Dr. Collene Mares consulted.  Bleeding stopped spontaneously.  Suspect diverticular bleed.  Did not require transfusion.  GI deferred endoscopy.  Diet advanced. H/h, vital signs stable at discharge.  Other medical problems remained stable.   Procedures:  none  Consultations:  GI, Dr. Collene Mares  Discharge Exam: Filed Vitals:   10/20/14 0503  BP: 145/61  Pulse: 109  Temp: 98.3 F (36.8 C)  Resp: 18    General: a and o Cardiovascular: RRR without MGR Respiratory: CTA without WRR abd s, nt, nd  Discharge Instructions   Discharge Instructions    Diet - low sodium heart healthy    Complete by:  As directed      Discharge instructions    Complete by:  As directed   Stop aspirin until cleared by your doctor     Increase activity slowly    Complete by:  As directed           Current Discharge Medication List    START taking these medications   Details  ferrous sulfate 325 (65 FE) MG tablet Take 1 tablet (325 mg total) by mouth daily with breakfast. Qty: 30 tablet, Refills: 1      CONTINUE these medications which have NOT CHANGED   Details  acetaminophen (TYLENOL) 500 MG tablet Take 500 mg by mouth every 6 (six) hours as needed. For pain    Calcium Carbonate-Vitamin D (CALTRATE 600+D) 600-400 MG-UNIT per tablet Take 1 tablet by mouth daily.    diltiazem (CARDIZEM CD) 120 MG 24 hr capsule Take 1 capsule (120 mg total) by mouth daily. Qty: 30 capsule, Refills: 0    dorzolamide-timolol (COSOPT) 22.3-6.8 MG/ML ophthalmic solution Place 1 drop into both eyes 2 (two) times daily.  fluticasone (FLONASE) 50 MCG/ACT nasal spray Place into both nostrils as needed for allergies or rhinitis.    hydrocortisone cream 1 % Apply 1 application topically daily.    lisinopril (PRINIVIL,ZESTRIL) 20 MG tablet Take 20 mg by mouth daily.    loratadine (CLARITIN) 10 MG tablet Take 10 mg by mouth daily.    LORazepam (ATIVAN) 1 MG  tablet Take 1 mg by mouth at bedtime.     memantine (NAMENDA) 10 MG tablet Take 10 mg by mouth 2 (two) times daily.    metoprolol succinate (TOPROL-XL) 50 MG 24 hr tablet Take 1 tablet (50 mg total) by mouth daily. Take with or immediately following a meal. Qty: 30 tablet, Refills: 5    Multiple Vitamins-Minerals (ALIVE WOMENS 50+ PO) Take by mouth daily.    pantoprazole (PROTONIX) 40 MG tablet Take 1 tablet (40 mg total) by mouth 2 (two) times daily before a meal. Qty: 60 tablet, Refills: 3    potassium chloride (K-DUR,KLOR-CON) 10 MEQ tablet Take 1 tablet (10 mEq total) by mouth daily. Qty: 30 tablet, Refills: 0    Probiotic Product (ALIGN PO) Take 1 tablet by mouth daily.    vitamin E 400 UNIT capsule Take 400 Units by mouth daily.    albuterol (PROVENTIL HFA;VENTOLIN HFA) 108 (90 BASE) MCG/ACT inhaler Inhale 2 puffs into the lungs every 4 (four) hours as needed for wheezing or shortness of breath. For shortness of breath    budesonide (PULMICORT) 180 MCG/ACT inhaler Inhale 2 puffs into the lungs as needed (SOB/Wheezing).     lovastatin (MEVACOR) 20 MG tablet Take 20 mg by mouth at bedtime.    nitroGLYCERIN (NITROSTAT) 0.4 MG SL tablet Place 1 tablet (0.4 mg total) under the tongue every 5 (five) minutes as needed for chest pain. Qty: 10 tablet, Refills: 2    Simethicone (GAS-X EXTRA STRENGTH) 125 MG CAPS Take 125 mg by mouth as needed (gas).       STOP taking these medications     aspirin 81 MG chewable tablet      cetirizine (ZYRTEC) 10 MG tablet      dorzolamide (TRUSOPT) 2 % ophthalmic solution        Allergies  Allergen Reactions  . Cymbalta [Duloxetine Hcl]     Diarrhea, weakness   Follow-up Information    Follow up with [name], MD. Schedule an appointment as soon as possible for a visit in 2 weeks.   Specialty:  Family Medicine   Why:  to check hemoglobin   Contact information:   [address] [place]  [address] [phone]        The results of significant diagnostics from this hospitalization (including imaging, microbiology, ancillary and laboratory) are listed below for reference.    Significant Diagnostic Studies: No results found.  Microbiology: No results found for this or any previous visit (from the past 240 hour(s)).   Labs: Basic Metabolic Panel:  Recent Labs Lab 10/18/14 1515 10/19/14 0450  NA 139 140  K 3.6 3.8  CL 107 109  CO2 24 24  GLUCOSE 127* 87  BUN 8 7  CREATININE 1.13* 0.97  CALCIUM 8.9 8.4*   Liver Function Tests:  Recent Labs Lab 10/18/14 1515  AST 22  ALT 16  ALKPHOS 45  BILITOT 0.9  PROT 6.6  ALBUMIN 3.7   No results for input(s): LIPASE, AMYLASE in the last 168 hours. No results for input(s): AMMONIA in the last 168 hours. CBC:  Recent  Labs Lab 10/18/14 1515 10/19/14 0450 10/20/14 0610  WBC 6.5 7.0 6.3  HGB 11.5* 10.4* 10.5*  HCT 36.3 32.2* 31.9*  MCV 91.9 89.9 90.4  PLT 222 200 202   Cardiac Enzymes:  Recent Labs Lab 10/18/14 1709 10/18/14 2258 10/19/14 0450  TROPONINI <0.03 <0.03 <0.03   BNP: BNP (last 3 results)  Recent Labs  06/10/14 1952 06/23/14 0008  BNP 143.5* 430.5*    ProBNP (last 3 results) No results for input(s): PROBNP in the last 8760 hours.  CBG: No results for input(s): GLUCAP in the last 168 hours.     SignedDelfina Redwood  [facility] 10/20/2014, 9:24 AM

## 2014-11-06 DIAGNOSIS — G4700 Insomnia, unspecified: Secondary | ICD-10-CM | POA: Diagnosis not present

## 2014-11-06 DIAGNOSIS — D500 Iron deficiency anemia secondary to blood loss (chronic): Secondary | ICD-10-CM | POA: Diagnosis not present

## 2014-11-06 DIAGNOSIS — I481 Persistent atrial fibrillation: Secondary | ICD-10-CM | POA: Diagnosis not present

## 2014-11-06 DIAGNOSIS — R079 Chest pain, unspecified: Secondary | ICD-10-CM | POA: Diagnosis not present

## 2014-11-21 ENCOUNTER — Encounter: Payer: Self-pay | Admitting: Internal Medicine

## 2014-11-21 ENCOUNTER — Ambulatory Visit (INDEPENDENT_AMBULATORY_CARE_PROVIDER_SITE_OTHER): Payer: Commercial Managed Care - HMO | Admitting: Internal Medicine

## 2014-11-21 VITALS — BP 100/60 | HR 80 | Ht 68.0 in | Wt 165.2 lb

## 2014-11-21 DIAGNOSIS — I480 Paroxysmal atrial fibrillation: Secondary | ICD-10-CM

## 2014-11-21 DIAGNOSIS — Z45018 Encounter for adjustment and management of other part of cardiac pacemaker: Secondary | ICD-10-CM | POA: Diagnosis not present

## 2014-11-21 DIAGNOSIS — I495 Sick sinus syndrome: Secondary | ICD-10-CM

## 2014-11-21 LAB — CUP PACEART INCLINIC DEVICE CHECK
Battery Voltage: 2.93 V
Brady Statistic RV Percent Paced: 12 %
Lead Channel Impedance Value: 425 Ohm
Lead Channel Impedance Value: 687.5 Ohm
Lead Channel Pacing Threshold Amplitude: 0.75 V
Lead Channel Pacing Threshold Pulse Width: 0.4 ms
Lead Channel Sensing Intrinsic Amplitude: 1.9 mV
Lead Channel Setting Sensing Sensitivity: 2 mV
MDC IDC MSMT BATTERY REMAINING LONGEVITY: 116.4 mo
MDC IDC MSMT LEADCHNL RV SENSING INTR AMPL: 12 mV
MDC IDC SESS DTM: 20160616174046
MDC IDC SET LEADCHNL RV PACING AMPLITUDE: 2.5 V
MDC IDC SET LEADCHNL RV PACING PULSEWIDTH: 0.4 ms
MDC IDC STAT BRADY RA PERCENT PACED: 0 %
Pulse Gen Model: 2110
Pulse Gen Serial Number: 7316617

## 2014-11-21 MED ORDER — LISINOPRIL 10 MG PO TABS: 10.0000 mg | ORAL_TABLET | Freq: Every day | ORAL | Status: DC

## 2014-11-21 MED ORDER — METOPROLOL SUCCINATE ER 50 MG PO TB24: 50.0000 mg | ORAL_TABLET | Freq: Two times a day (BID) | ORAL | Status: DC

## 2014-11-21 NOTE — Progress Notes (Signed)
Patient Care Team: [name], MD as PCP - General (Family Medicine) Sueanne Margarita, MD as Attending Physician (Cardiology) Ivin Poot, MD as Attending Physician (Cardiothoracic Surgery)   HPI  Jasmine [name] is a 81 y.o. female Seen following pacemaker implantation 4/13 for symptomatic sinus node dysfunction with manifestations of junctional rhythm with rates in the 25-30 range.  She is a prior history of mitral valve replacement  normal LV function 7/12 She developed atrial fibrillation 2014 underwent cardioversion.  She has had some edema that is intermittent.  She has atrial fibrillation with a prior history of cardioversion. She has not been on anticoagulation presumably because of his recent GI bleed. Heart rate excursion has been modestly fast. She denies palpitations or significant changes in exercise tolerance   She has a history of intermittent hypokalemia. She's been on potassium supplementation. I did not see an evaluation in the medical record for renin  Past Medical History  Diagnosis Date  . GERD (gastroesophageal reflux disease)   . Insomnia   . Eczema   . Glaucoma   . HOCM (hypertrophic obstructive cardiomyopathy)     a. s/p septal myomectomy;  b. 06/2014 Echo: EF 50-55%, mild antsept HK, mild MR, mild MS/MR (bioprosth valve), mildly dil LA, mod TR, PASP 68mmHg.  . High cholesterol   . Non-obstructive coronary artery disease     a. 06/2014 NSTEMI/Cath: LM nl, LAD nl, LCX nl, RCA 10-54m, EF 45%.  . Blood transfusion   . Chronic kidney disease (CKD), stage III (moderate)   . History of Severe Mitral Regurgitation     a. s/p MVR with pericardial tissue valve  . Diverticulosis of colon with hemorrhage 08/12/2012  . History of GI Bleed   . Asthma   . Hypertension   . Reflux esophagitis   . Pulmonary HTN     a. mild to moderate (PASP 29mmHg by echo 06/2014).  Marland Kitchen Aortic insufficiency     a. Not seen on echo 06/2014.  . Bradycardia     a. 10/2011 s/p SJM  [facility] DR DC PPM, ser #: [id number].  . Atrial fibrillation and flutter     a. atrial fibrillation s/p DCCV 06-2012;  b. now in chronic atrial fibrillation off anticoagulation due to GI bleed.  . Paroxysmal atrial tachycardia   . PVCs (premature ventricular contractions)   . DCM (dilated cardiomyopathy)     a. 50% echo 06/2012;  b. 06/2014 Echo: EF 50-55%.  Marland Kitchen Heart murmur   . Dementia   . Atypical chest pain     Past Surgical History  Procedure Laterality Date  . Cardiac catheterization    . Cardiac valve replacement  2007    MVR  . Myomectomy  2007    septal  . Tonsillectomy    . Appendectomy    . Cholecystectomy    . Tubal ligation    . Hernia repair      "in my stomach"  . Cataract extraction w/ intraocular lens  implant, bilateral    . Cardioversion  07/04/2012    Procedure: CARDIOVERSION;  Surgeon: Sueanne Margarita, MD;  Location: [facility];  Service: Cardiovascular;  Laterality: N/A;  h/p in file drawer   . Colonoscopy    . Colon resection    . Colonoscopy N/A 08/13/2012    Procedure: COLONOSCOPY;  Surgeon: Gatha Mayer, MD;  Location: [facility];  Service: Endoscopy;  Laterality: N/A;  . Permanent pacemaker insertion N/A 10/07/2011  Procedure: PERMANENT PACEMAKER INSERTION;  Surgeon: Deboraha Sprang, MD;  Location: [facility] CATH LAB;  Service: Cardiovascular;  Laterality: N/A;  . Left heart catheterization with coronary angiogram N/A 06/11/2014    Procedure: LEFT HEART CATHETERIZATION WITH CORONARY ANGIOGRAM;  Surgeon: Troy Sine, MD;  Location: [facility] CATH LAB;  Service: Cardiovascular;  Laterality: N/A;    Current Outpatient Prescriptions  Medication Sig Dispense Refill  . acetaminophen (TYLENOL) 500 MG tablet Take 500 mg by mouth every 6 (six) hours as needed. For pain    . albuterol (PROVENTIL HFA;VENTOLIN HFA) 108 (90 BASE) MCG/ACT inhaler Inhale 2 puffs into the lungs every 4 (four) hours as needed for wheezing or shortness of breath. For shortness of breath    . budesonide  (PULMICORT) 180 MCG/ACT inhaler Inhale 2 puffs into the lungs as needed (SOB/Wheezing).     . Calcium Carbonate-Vitamin D (CALTRATE 600+D) 600-400 MG-UNIT per tablet Take 1 tablet by mouth daily.    Marland Kitchen diltiazem (CARDIZEM CD) 120 MG 24 hr capsule Take 1 capsule (120 mg total) by mouth daily. 30 capsule 0  . dorzolamide-timolol (COSOPT) 22.3-6.8 MG/ML ophthalmic solution Place 1 drop into both eyes 2 (two) times daily.     . ferrous sulfate 325 (65 FE) MG tablet Take 1 tablet (325 mg total) by mouth daily with breakfast. 30 tablet 1  . fluticasone (FLONASE) 50 MCG/ACT nasal spray Place into both nostrils as needed for allergies or rhinitis.    . hydrocortisone cream 1 % Apply 1 application topically daily.    Marland Kitchen lisinopril (PRINIVIL,ZESTRIL) 20 MG tablet Take 20 mg by mouth daily.    Marland Kitchen loratadine (CLARITIN) 10 MG tablet Take 10 mg by mouth daily.    Marland Kitchen LORazepam (ATIVAN) 1 MG tablet Take 1 mg by mouth at bedtime.     . lovastatin (MEVACOR) 20 MG tablet Take 20 mg by mouth at bedtime.    . memantine (NAMENDA) 10 MG tablet Take 10 mg by mouth 2 (two) times daily.    . metoprolol succinate (TOPROL-XL) 50 MG 24 hr tablet Take 1 tablet (50 mg total) by mouth daily. Take with or immediately following a meal. 30 tablet 5  . Multiple Vitamins-Minerals (ALIVE WOMENS 50+ PO) Take by mouth daily.    . nitroGLYCERIN (NITROSTAT) 0.4 MG SL tablet Place 1 tablet (0.4 mg total) under the tongue every 5 (five) minutes as needed for chest pain. 10 tablet 2  . pantoprazole (PROTONIX) 40 MG tablet Take 1 tablet (40 mg total) by mouth 2 (two) times daily before a meal. 60 tablet 3  . potassium chloride (K-DUR) 10 MEQ tablet     . potassium chloride (K-DUR,KLOR-CON) 10 MEQ tablet Take 1 tablet (10 mEq total) by mouth daily. 30 tablet 0  . Probiotic Product (ALIGN PO) Take 1 tablet by mouth daily.    . Simethicone (GAS-X EXTRA STRENGTH) 125 MG CAPS Take 125 mg by mouth as needed (gas).     . vitamin E 400 UNIT capsule Take  400 Units by mouth daily.     No current facility-administered medications for this visit.    Allergies  Allergen Reactions  . Cymbalta [Duloxetine Hcl]     Diarrhea, weakness    Review of Systems negative except from HPI and PMH  Physical Exam BP 100/60 mmHg  Pulse 80  Ht 5\' 8"  (1.727 m)  Wt 165 lb 3.2 oz (74.934 kg)  BMI 25.12 kg/m2  SpO2 97% Well developed and well nourished in no acute  distress HENT normal E scleral and icterus clear Neck Supple JVP flat; carotids brisk and full Clear to ausculation Irregularly irregular and rhythm, no murmurs gallops or rub Soft with active bowel sounds No clubbing cyanosis tr Edema Alert and oriented, grossly normal motor and sensory function Skin Warm and Dry  Afib with  LBBB  Occ pacing  Assessment and  Plan   Mitral regurgitation s/p MVR  Hyypertension  Pacemaker-St. Jude  The patient's device was interrogated.  The information was reviewed. No changes were made in the programming.    Atrial fibrillation  permanent  Hypokalemia  She is not on anticoagulation  She has  history of GI bleeding related to diverticulosis. Presumably this is been the indication for not using anticoagulation.  Her heart rates are fast. >> we will plan to decrease her lisinopril from 20--10 and increase her metoprolol from 50--100 daily.   Spoke with renal  dont have great idea as ot why she might be potassium wasting will defer eval to PCP

## 2014-11-21 NOTE — Patient Instructions (Signed)
Medication Instructions:  Your physician has recommended you make the following change in your medication:  1) DECREASE Lisinopril to 10 mg daily 2) INCREASE Metoprolol Succinate to 50 mg twice daily  Labwork: None ordered  Testing/Procedures: None ordered  Follow-Up: Keep follow up with Dr. Radford Pax in October.  Remote monitoring is used to monitor your Pacemaker of ICD from home. This monitoring reduces the number of office visits required to check your device to one time per year. It allows [place] to keep an eye on the functioning of your device to ensure it is working properly. You are scheduled for a device check from home on 02/20/15. You may send your transmission at any time that day. If you have a wireless device, the transmission will be sent automatically. After your physician reviews your transmission, you will receive a postcard with your next transmission date.  Your physician wants you to follow-up in: 1 year with Chanetta Marshall, NP.  You will receive a reminder letter in the mail two months in advance. If you don't receive a letter, please call our office to schedule the follow-up appointment.   Thank you for choosing [facility]!!

## 2015-01-08 ENCOUNTER — Encounter: Payer: Self-pay | Admitting: Cardiology

## 2015-01-08 ENCOUNTER — Telehealth: Payer: Self-pay | Admitting: Cardiology

## 2015-02-13 NOTE — Telephone Encounter (Signed)
error 

## 2015-02-20 ENCOUNTER — Telehealth: Payer: Self-pay | Admitting: Cardiology

## 2015-02-20 ENCOUNTER — Encounter: Payer: Commercial Managed Care - HMO | Admitting: *Deleted

## 2015-02-20 NOTE — Telephone Encounter (Signed)
Spoke with pt and reminded pt of remote transmission that is due today. Pt verbalized understanding.   

## 2015-02-21 ENCOUNTER — Encounter: Payer: Self-pay | Admitting: Cardiology

## 2015-02-28 ENCOUNTER — Encounter: Payer: Self-pay | Admitting: Internal Medicine

## 2015-02-28 ENCOUNTER — Ambulatory Visit (INDEPENDENT_AMBULATORY_CARE_PROVIDER_SITE_OTHER): Payer: Commercial Managed Care - HMO | Admitting: *Deleted

## 2015-02-28 DIAGNOSIS — I495 Sick sinus syndrome: Secondary | ICD-10-CM

## 2015-03-06 NOTE — Progress Notes (Signed)
Remote pacemaker transmission.   

## 2015-03-18 LAB — CUP PACEART REMOTE DEVICE CHECK
Battery Remaining Longevity: 143 mo
Battery Voltage: 2.96 V
Date Time Interrogation Session: 20160923133244
Lead Channel Impedance Value: 690 Ohm
Lead Channel Pacing Threshold Amplitude: 0.75 V
Lead Channel Pacing Threshold Pulse Width: 0.4 ms
Lead Channel Sensing Intrinsic Amplitude: 12 mV
Lead Channel Setting Sensing Sensitivity: 2 mV
MDC IDC MSMT BATTERY REMAINING PERCENTAGE: 95.5 %
MDC IDC SET LEADCHNL RV PACING AMPLITUDE: 2.5 V
MDC IDC SET LEADCHNL RV PACING PULSEWIDTH: 0.4 ms
MDC IDC STAT BRADY RV PERCENT PACED: 31 %
Pulse Gen Model: 2110
Pulse Gen Serial Number: 7316617

## 2015-03-19 DIAGNOSIS — G4700 Insomnia, unspecified: Secondary | ICD-10-CM | POA: Diagnosis not present

## 2015-03-19 DIAGNOSIS — N183 Chronic kidney disease, stage 3 (moderate): Secondary | ICD-10-CM | POA: Diagnosis not present

## 2015-03-19 DIAGNOSIS — E7800 Pure hypercholesterolemia, unspecified: Secondary | ICD-10-CM | POA: Diagnosis not present

## 2015-03-19 DIAGNOSIS — Z0000 Encounter for general adult medical examination without abnormal findings: Secondary | ICD-10-CM | POA: Diagnosis not present

## 2015-03-19 DIAGNOSIS — F0390 Unspecified dementia without behavioral disturbance: Secondary | ICD-10-CM | POA: Diagnosis not present

## 2015-03-19 DIAGNOSIS — I481 Persistent atrial fibrillation: Secondary | ICD-10-CM | POA: Diagnosis not present

## 2015-03-19 DIAGNOSIS — K219 Gastro-esophageal reflux disease without esophagitis: Secondary | ICD-10-CM | POA: Diagnosis not present

## 2015-03-19 DIAGNOSIS — I10 Essential (primary) hypertension: Secondary | ICD-10-CM | POA: Diagnosis not present

## 2015-03-27 ENCOUNTER — Encounter (HOSPITAL_COMMUNITY)
Admission: EM | Disposition: A | Discharge status: Home Health | Payer: Commercial Managed Care - HMO | Source: Home / Self Care | Attending: Internal Medicine

## 2015-03-27 ENCOUNTER — Encounter (HOSPITAL_COMMUNITY): Payer: Self-pay | Admitting: Neurology

## 2015-03-27 ENCOUNTER — Inpatient Hospital Stay (HOSPITAL_COMMUNITY)
Admission: EM | Admit: 2015-03-27 | Discharge: 2015-04-02 | DRG: 389 | Disposition: A | Discharge status: Home Health | Payer: Commercial Managed Care - HMO | Attending: Internal Medicine | Admitting: Internal Medicine

## 2015-03-27 ENCOUNTER — Inpatient Hospital Stay (HOSPITAL_COMMUNITY): Payer: Commercial Managed Care - HMO

## 2015-03-27 DIAGNOSIS — N183 Chronic kidney disease, stage 3 unspecified: Secondary | ICD-10-CM | POA: Diagnosis present

## 2015-03-27 DIAGNOSIS — Z87891 Personal history of nicotine dependence: Secondary | ICD-10-CM | POA: Diagnosis not present

## 2015-03-27 DIAGNOSIS — R52 Pain, unspecified: Secondary | ICD-10-CM

## 2015-03-27 DIAGNOSIS — Z8249 Family history of ischemic heart disease and other diseases of the circulatory system: Secondary | ICD-10-CM

## 2015-03-27 DIAGNOSIS — I517 Cardiomegaly: Secondary | ICD-10-CM | POA: Diagnosis present

## 2015-03-27 DIAGNOSIS — Z823 Family history of stroke: Secondary | ICD-10-CM | POA: Diagnosis not present

## 2015-03-27 DIAGNOSIS — J45909 Unspecified asthma, uncomplicated: Secondary | ICD-10-CM | POA: Diagnosis present

## 2015-03-27 DIAGNOSIS — I447 Left bundle-branch block, unspecified: Secondary | ICD-10-CM | POA: Diagnosis present

## 2015-03-27 DIAGNOSIS — I272 Other secondary pulmonary hypertension: Secondary | ICD-10-CM | POA: Diagnosis present

## 2015-03-27 DIAGNOSIS — I2119 ST elevation (STEMI) myocardial infarction involving other coronary artery of inferior wall: Secondary | ICD-10-CM | POA: Diagnosis not present

## 2015-03-27 DIAGNOSIS — K565 Intestinal adhesions [bands], unspecified as to partial versus complete obstruction: Secondary | ICD-10-CM

## 2015-03-27 DIAGNOSIS — Z8719 Personal history of other diseases of the digestive system: Secondary | ICD-10-CM | POA: Diagnosis not present

## 2015-03-27 DIAGNOSIS — K219 Gastro-esophageal reflux disease without esophagitis: Secondary | ICD-10-CM | POA: Diagnosis present

## 2015-03-27 DIAGNOSIS — Z953 Presence of xenogenic heart valve: Secondary | ICD-10-CM | POA: Diagnosis not present

## 2015-03-27 DIAGNOSIS — R9431 Abnormal electrocardiogram [ECG] [EKG]: Secondary | ICD-10-CM | POA: Diagnosis not present

## 2015-03-27 DIAGNOSIS — R269 Unspecified abnormalities of gait and mobility: Secondary | ICD-10-CM | POA: Diagnosis not present

## 2015-03-27 DIAGNOSIS — Z950 Presence of cardiac pacemaker: Secondary | ICD-10-CM

## 2015-03-27 DIAGNOSIS — Z4659 Encounter for fitting and adjustment of other gastrointestinal appliance and device: Secondary | ICD-10-CM

## 2015-03-27 DIAGNOSIS — I428 Other cardiomyopathies: Secondary | ICD-10-CM | POA: Diagnosis present

## 2015-03-27 DIAGNOSIS — Z952 Presence of prosthetic heart valve: Secondary | ICD-10-CM | POA: Diagnosis not present

## 2015-03-27 DIAGNOSIS — I481 Persistent atrial fibrillation: Secondary | ICD-10-CM | POA: Diagnosis present

## 2015-03-27 DIAGNOSIS — I421 Obstructive hypertrophic cardiomyopathy: Secondary | ICD-10-CM | POA: Diagnosis present

## 2015-03-27 DIAGNOSIS — I10 Essential (primary) hypertension: Secondary | ICD-10-CM | POA: Diagnosis not present

## 2015-03-27 DIAGNOSIS — Z7901 Long term (current) use of anticoagulants: Secondary | ICD-10-CM

## 2015-03-27 DIAGNOSIS — R1084 Generalized abdominal pain: Secondary | ICD-10-CM | POA: Diagnosis not present

## 2015-03-27 DIAGNOSIS — I482 Chronic atrial fibrillation, unspecified: Secondary | ICD-10-CM | POA: Diagnosis present

## 2015-03-27 DIAGNOSIS — I213 ST elevation (STEMI) myocardial infarction of unspecified site: Secondary | ICD-10-CM

## 2015-03-27 DIAGNOSIS — K5900 Constipation, unspecified: Secondary | ICD-10-CM | POA: Diagnosis present

## 2015-03-27 DIAGNOSIS — R140 Abdominal distension (gaseous): Secondary | ICD-10-CM

## 2015-03-27 DIAGNOSIS — R1310 Dysphagia, unspecified: Secondary | ICD-10-CM | POA: Diagnosis not present

## 2015-03-27 DIAGNOSIS — K5660 Unspecified intestinal obstruction: Secondary | ICD-10-CM | POA: Diagnosis not present

## 2015-03-27 DIAGNOSIS — IMO0001 Reserved for inherently not codable concepts without codable children: Secondary | ICD-10-CM

## 2015-03-27 DIAGNOSIS — R1013 Epigastric pain: Secondary | ICD-10-CM

## 2015-03-27 DIAGNOSIS — R109 Unspecified abdominal pain: Secondary | ICD-10-CM

## 2015-03-27 DIAGNOSIS — F0390 Unspecified dementia without behavioral disturbance: Secondary | ICD-10-CM | POA: Diagnosis not present

## 2015-03-27 DIAGNOSIS — K56609 Unspecified intestinal obstruction, unspecified as to partial versus complete obstruction: Secondary | ICD-10-CM

## 2015-03-27 DIAGNOSIS — Z4682 Encounter for fitting and adjustment of non-vascular catheter: Secondary | ICD-10-CM | POA: Diagnosis not present

## 2015-03-27 DIAGNOSIS — I2510 Atherosclerotic heart disease of native coronary artery without angina pectoris: Secondary | ICD-10-CM | POA: Diagnosis present

## 2015-03-27 DIAGNOSIS — Z0189 Encounter for other specified special examinations: Secondary | ICD-10-CM

## 2015-03-27 DIAGNOSIS — R0789 Other chest pain: Secondary | ICD-10-CM | POA: Diagnosis not present

## 2015-03-27 DIAGNOSIS — I129 Hypertensive chronic kidney disease with stage 1 through stage 4 chronic kidney disease, or unspecified chronic kidney disease: Secondary | ICD-10-CM | POA: Diagnosis present

## 2015-03-27 DIAGNOSIS — E785 Hyperlipidemia, unspecified: Secondary | ICD-10-CM | POA: Diagnosis present

## 2015-03-27 DIAGNOSIS — Z9049 Acquired absence of other specified parts of digestive tract: Secondary | ICD-10-CM

## 2015-03-27 HISTORY — PX: CARDIAC CATHETERIZATION: SHX172

## 2015-03-27 HISTORY — DX: Hyperlipidemia, unspecified: E78.5

## 2015-03-27 HISTORY — DX: Essential (primary) hypertension: I10

## 2015-03-27 LAB — URINALYSIS, ROUTINE W REFLEX MICROSCOPIC
BILIRUBIN URINE: NEGATIVE
Glucose, UA: NEGATIVE mg/dL
HGB URINE DIPSTICK: NEGATIVE
Ketones, ur: NEGATIVE mg/dL
Nitrite: NEGATIVE
PH: 7.5 (ref 5.0–8.0)
Protein, ur: NEGATIVE mg/dL
SPECIFIC GRAVITY, URINE: 1.02 (ref 1.005–1.030)
Urobilinogen, UA: 1 mg/dL (ref 0.0–1.0)

## 2015-03-27 LAB — PROTIME-INR
INR: 1.28 (ref 0.00–1.49)
Prothrombin Time: 16.1 seconds — ABNORMAL HIGH (ref 11.6–15.2)

## 2015-03-27 LAB — COMPREHENSIVE METABOLIC PANEL
ALBUMIN: 4 g/dL (ref 3.5–5.0)
ALT: 30 U/L (ref 14–54)
AST: 42 U/L — AB (ref 15–41)
Alkaline Phosphatase: 54 U/L (ref 38–126)
Anion gap: 9 (ref 5–15)
BILIRUBIN TOTAL: 0.9 mg/dL (ref 0.3–1.2)
BUN: 7 mg/dL (ref 6–20)
CHLORIDE: 102 mmol/L (ref 101–111)
CO2: 27 mmol/L (ref 22–32)
Calcium: 9.7 mg/dL (ref 8.9–10.3)
Creatinine, Ser: 1.09 mg/dL — ABNORMAL HIGH (ref 0.44–1.00)
GFR calc Af Amer: 51 mL/min — ABNORMAL LOW (ref >60)
GFR calc non Af Amer: 44 mL/min — ABNORMAL LOW (ref >60)
GLUCOSE: 122 mg/dL — AB (ref 65–99)
POTASSIUM: 4.5 mmol/L (ref 3.5–5.1)
SODIUM: 138 mmol/L (ref 135–145)
TOTAL PROTEIN: 7.5 g/dL (ref 6.5–8.1)

## 2015-03-27 LAB — LIPID PANEL
CHOLESTEROL: 134 mg/dL (ref 0–200)
HDL: 51 mg/dL (ref >40)
LDL Cholesterol: 75 mg/dL (ref 0–99)
TRIGLYCERIDES: 38 mg/dL (ref <150)
Total CHOL/HDL Ratio: 2.6 RATIO
VLDL: 8 mg/dL (ref 0–40)

## 2015-03-27 LAB — DIFFERENTIAL
BASOS PCT: 0 %
Basophils Absolute: 0 10*3/uL (ref 0.0–0.1)
EOS ABS: 0.1 10*3/uL (ref 0.0–0.7)
Eosinophils Relative: 1 %
Lymphocytes Relative: 11 %
Lymphs Abs: 1.7 10*3/uL (ref 0.7–4.0)
MONO ABS: 0.6 10*3/uL (ref 0.1–1.0)
MONOS PCT: 4 %
NEUTROS ABS: 12.5 10*3/uL — AB (ref 1.7–7.7)
Neutrophils Relative %: 84 %

## 2015-03-27 LAB — POCT I-STAT, CHEM 8
BUN: 9 mg/dL (ref 6–20)
Calcium, Ion: 1.25 mmol/L (ref 1.13–1.30)
Chloride: 102 mmol/L (ref 101–111)
Creatinine, Ser: 0.9 mg/dL (ref 0.44–1.00)
Glucose, Bld: 129 mg/dL — ABNORMAL HIGH (ref 65–99)
HEMATOCRIT: 40 % (ref 36.0–46.0)
Hemoglobin: 13.6 g/dL (ref 12.0–15.0)
POTASSIUM: 3.8 mmol/L (ref 3.5–5.1)
Sodium: 140 mmol/L (ref 135–145)
TCO2: 25 mmol/L (ref 0–100)

## 2015-03-27 LAB — CBC
HEMATOCRIT: 41.4 % (ref 36.0–46.0)
Hemoglobin: 13.5 g/dL (ref 12.0–15.0)
MCH: 29.8 pg (ref 26.0–34.0)
MCHC: 32.6 g/dL (ref 30.0–36.0)
MCV: 91.4 fL (ref 78.0–100.0)
Platelets: 211 10*3/uL (ref 150–400)
RBC: 4.53 MIL/uL (ref 3.87–5.11)
RDW: 14.7 % (ref 11.5–15.5)
WBC: 14.9 10*3/uL — ABNORMAL HIGH (ref 4.0–10.5)

## 2015-03-27 LAB — POCT ACTIVATED CLOTTING TIME: ACTIVATED CLOTTING TIME: 196 s

## 2015-03-27 LAB — CK TOTAL AND CKMB (NOT AT ARMC)
CK TOTAL: 52 U/L (ref 38–234)
CK, MB: 2.6 ng/mL (ref 0.5–5.0)
Relative Index: INVALID (ref 0.0–2.5)

## 2015-03-27 LAB — POCT I-STAT TROPONIN I: Troponin i, poc: 0.03 ng/mL (ref 0.00–0.08)

## 2015-03-27 LAB — MRSA PCR SCREENING: MRSA BY PCR: NEGATIVE

## 2015-03-27 LAB — URINE MICROSCOPIC-ADD ON

## 2015-03-27 LAB — TROPONIN I

## 2015-03-27 LAB — LIPASE, BLOOD: Lipase: 186 U/L — ABNORMAL HIGH (ref 11–51)

## 2015-03-27 LAB — APTT

## 2015-03-27 SURGERY — LEFT HEART CATH AND CORONARY ANGIOGRAPHY
Anesthesia: LOCAL

## 2015-03-27 MED ORDER — HEPARIN (PORCINE) IN NACL 100-0.45 UNIT/ML-% IJ SOLN
900.0000 [IU]/h | INTRAMUSCULAR | Status: DC
  Administered 2015-03-27: 900 [IU]/h via INTRAVENOUS
  Filled 2015-03-27: qty 250

## 2015-03-27 MED ORDER — HEPARIN BOLUS VIA INFUSION
4000.0000 [IU] | Freq: Once | INTRAVENOUS | Status: AC
  Administered 2015-03-27: 4000 [IU] via INTRAVENOUS
  Filled 2015-03-27: qty 4000

## 2015-03-27 MED ORDER — CALCIUM CARBONATE-VITAMIN D 500-200 MG-UNIT PO TABS
1.0000 | ORAL_TABLET | Freq: Every day | ORAL | Status: DC
  Administered 2015-03-27: 1 via ORAL
  Filled 2015-03-27 (×2): qty 1

## 2015-03-27 MED ORDER — SODIUM CHLORIDE 0.9 % IV SOLN: 250.0000 mL | INTRAVENOUS | Status: DC | PRN

## 2015-03-27 MED ORDER — HEPARIN (PORCINE) IN NACL 2-0.9 UNIT/ML-% IJ SOLN
INTRAMUSCULAR | Status: AC
  Filled 2015-03-27: qty 500

## 2015-03-27 MED ORDER — SIMETHICONE 80 MG PO CHEW
80.0000 mg | CHEWABLE_TABLET | Freq: Four times a day (QID) | ORAL | Status: DC | PRN
  Administered 2015-04-01 – 2015-04-02 (×2): 80 mg via ORAL
  Filled 2015-03-27: qty 1

## 2015-03-27 MED ORDER — LORAZEPAM 1 MG PO TABS
1.0000 mg | ORAL_TABLET | Freq: Every day | ORAL | Status: DC
  Administered 2015-03-27: 1 mg via ORAL
  Filled 2015-03-27: qty 1

## 2015-03-27 MED ORDER — MIDAZOLAM HCL 2 MG/2ML IJ SOLN
INTRAMUSCULAR | Status: AC
  Filled 2015-03-27: qty 4

## 2015-03-27 MED ORDER — MORPHINE SULFATE (PF) 2 MG/ML IV SOLN
2.0000 mg | INTRAVENOUS | Status: DC | PRN
  Administered 2015-03-27 – 2015-03-31 (×15): 2 mg via INTRAVENOUS
  Filled 2015-03-27 (×15): qty 1

## 2015-03-27 MED ORDER — LORATADINE 10 MG PO TABS
10.0000 mg | ORAL_TABLET | Freq: Every day | ORAL | Status: DC
Start: 2015-03-27 — End: 2015-03-28
  Administered 2015-03-27: 10 mg via ORAL
  Filled 2015-03-27: qty 1

## 2015-03-27 MED ORDER — GI COCKTAIL ~~LOC~~
30.0000 mL | Freq: Three times a day (TID) | ORAL | Status: DC | PRN
  Administered 2015-03-27 – 2015-04-02 (×9): 30 mL via ORAL
  Filled 2015-03-27 (×9): qty 30

## 2015-03-27 MED ORDER — ALBUTEROL SULFATE (2.5 MG/3ML) 0.083% IN NEBU
2.5000 mg | INHALATION_SOLUTION | RESPIRATORY_TRACT | Status: DC | PRN
  Administered 2015-03-31: 2.5 mg via RESPIRATORY_TRACT
  Filled 2015-03-27: qty 3

## 2015-03-27 MED ORDER — NITROGLYCERIN 0.4 MG SL SUBL: 0.4000 mg | SUBLINGUAL_TABLET | SUBLINGUAL | Status: DC | PRN

## 2015-03-27 MED ORDER — SODIUM CHLORIDE 0.9 % IJ SOLN: 3.0000 mL | INTRAMUSCULAR | Status: DC | PRN

## 2015-03-27 MED ORDER — SODIUM CHLORIDE 0.9 % IJ SOLN
3.0000 mL | Freq: Two times a day (BID) | INTRAMUSCULAR | Status: DC
  Administered 2015-03-28 – 2015-04-02 (×6): 3 mL via INTRAVENOUS

## 2015-03-27 MED ORDER — MORPHINE SULFATE (PF) 4 MG/ML IV SOLN
4.0000 mg | Freq: Once | INTRAVENOUS | Status: AC
  Administered 2015-03-27: 4 mg via INTRAVENOUS
  Filled 2015-03-27: qty 1

## 2015-03-27 MED ORDER — MIDAZOLAM HCL 2 MG/2ML IJ SOLN
INTRAMUSCULAR | Status: DC | PRN
  Administered 2015-03-27: 1 mg via INTRAVENOUS

## 2015-03-27 MED ORDER — SODIUM CHLORIDE 0.9 % WEIGHT BASED INFUSION: 3.0000 mL/kg/h | INTRAVENOUS | Status: AC

## 2015-03-27 MED ORDER — FERROUS SULFATE 325 (65 FE) MG PO TABS
325.0000 mg | ORAL_TABLET | Freq: Every day | ORAL | Status: DC
  Filled 2015-03-27: qty 1

## 2015-03-27 MED ORDER — PANTOPRAZOLE SODIUM 40 MG PO TBEC
40.0000 mg | DELAYED_RELEASE_TABLET | Freq: Two times a day (BID) | ORAL | Status: DC
  Filled 2015-03-27: qty 1

## 2015-03-27 MED ORDER — METOPROLOL SUCCINATE ER 50 MG PO TB24
50.0000 mg | ORAL_TABLET | Freq: Two times a day (BID) | ORAL | Status: DC
  Administered 2015-03-27: 50 mg via ORAL
  Filled 2015-03-27: qty 1

## 2015-03-27 MED ORDER — FENTANYL CITRATE (PF) 100 MCG/2ML IJ SOLN
INTRAMUSCULAR | Status: DC | PRN
  Administered 2015-03-27: 25 ug via INTRAVENOUS

## 2015-03-27 MED ORDER — FLUTICASONE PROPIONATE 50 MCG/ACT NA SUSP
2.0000 | Freq: Every day | NASAL | Status: DC | PRN
  Administered 2015-03-27: 2 via NASAL
  Filled 2015-03-27 (×2): qty 16

## 2015-03-27 MED ORDER — LISINOPRIL 10 MG PO TABS
10.0000 mg | ORAL_TABLET | Freq: Every day | ORAL | Status: DC
  Administered 2015-03-27: 10 mg via ORAL
  Filled 2015-03-27: qty 1

## 2015-03-27 MED ORDER — LIDOCAINE HCL (PF) 1 % IJ SOLN
INTRAMUSCULAR | Status: AC
  Filled 2015-03-27: qty 30

## 2015-03-27 MED ORDER — ACETAMINOPHEN 325 MG PO TABS
650.0000 mg | ORAL_TABLET | ORAL | Status: DC | PRN
  Administered 2015-03-31 – 2015-04-02 (×7): 650 mg via ORAL
  Filled 2015-03-27 (×7): qty 2

## 2015-03-27 MED ORDER — DORZOLAMIDE HCL-TIMOLOL MAL 2-0.5 % OP SOLN
1.0000 [drp] | Freq: Two times a day (BID) | OPHTHALMIC | Status: DC
  Administered 2015-03-27 – 2015-04-02 (×12): 1 [drp] via OPHTHALMIC
  Filled 2015-03-27 (×3): qty 10

## 2015-03-27 MED ORDER — DILTIAZEM HCL ER COATED BEADS 120 MG PO CP24
120.0000 mg | ORAL_CAPSULE | ORAL | Status: DC
  Administered 2015-03-27: 120 mg via ORAL
  Filled 2015-03-27: qty 1

## 2015-03-27 MED ORDER — ASPIRIN 81 MG PO CHEW
324.0000 mg | CHEWABLE_TABLET | Freq: Once | ORAL | Status: AC
  Administered 2015-03-27: 324 mg via ORAL

## 2015-03-27 MED ORDER — MEMANTINE HCL 10 MG PO TABS
10.0000 mg | ORAL_TABLET | Freq: Two times a day (BID) | ORAL | Status: DC
  Administered 2015-03-27: 10 mg via ORAL
  Filled 2015-03-27 (×4): qty 1

## 2015-03-27 MED ORDER — BUDESONIDE 0.25 MG/2ML IN SUSP
0.2500 mg | Freq: Two times a day (BID) | RESPIRATORY_TRACT | Status: DC
  Administered 2015-03-28 – 2015-04-02 (×11): 0.25 mg via RESPIRATORY_TRACT
  Filled 2015-03-27 (×11): qty 2

## 2015-03-27 MED ORDER — VITAMIN E 180 MG (400 UNIT) PO CAPS
400.0000 [IU] | ORAL_CAPSULE | Freq: Every day | ORAL | Status: DC
  Administered 2015-03-27: 400 [IU] via ORAL
  Filled 2015-03-27 (×3): qty 1

## 2015-03-27 MED ORDER — FENTANYL CITRATE (PF) 100 MCG/2ML IJ SOLN
INTRAMUSCULAR | Status: AC
  Filled 2015-03-27: qty 4

## 2015-03-27 MED ORDER — ONDANSETRON HCL 4 MG/2ML IJ SOLN
4.0000 mg | Freq: Four times a day (QID) | INTRAMUSCULAR | Status: DC | PRN
  Administered 2015-03-27 – 2015-04-02 (×6): 4 mg via INTRAVENOUS
  Filled 2015-03-27 (×6): qty 2

## 2015-03-27 MED ORDER — IOHEXOL 350 MG/ML SOLN
INTRAVENOUS | Status: DC | PRN
  Administered 2015-03-27: 90 mL via INTRAVENOUS

## 2015-03-27 MED ORDER — POTASSIUM CHLORIDE CRYS ER 10 MEQ PO TBCR
10.0000 meq | EXTENDED_RELEASE_TABLET | Freq: Every day | ORAL | Status: DC
Start: 2015-03-27 — End: 2015-03-28
  Administered 2015-03-27: 10 meq via ORAL
  Filled 2015-03-27: qty 1

## 2015-03-27 MED ORDER — ASPIRIN 81 MG PO CHEW
CHEWABLE_TABLET | ORAL | Status: AC
  Filled 2015-03-27: qty 4

## 2015-03-27 SURGICAL SUPPLY — 12 items
CATH INFINITI 5FR ANG PIGTAIL (CATHETERS) ×2 IMPLANT
CATH OPTITORQUE TIG 4.0 5F (CATHETERS) ×2 IMPLANT
DEVICE RAD COMP TR BAND LRG (VASCULAR PRODUCTS) ×2 IMPLANT
DEVICE WIRE ANGIOSEAL 6FR (Vascular Products) ×1 IMPLANT
GLIDESHEATH SLEND A-KIT 6F 22G (SHEATH) ×2 IMPLANT
KIT HEART LEFT (KITS) ×2 IMPLANT
PACK CARDIAC CATHETERIZATION (CUSTOM PROCEDURE TRAY) ×2 IMPLANT
SHEATH PINNACLE 6F 10CM (SHEATH) ×2 IMPLANT
SYR MEDRAD MARK V 150ML (SYRINGE) ×2 IMPLANT
TRANSDUCER W/STOPCOCK (MISCELLANEOUS) ×2 IMPLANT
TUBING CIL FLEX 10 FLL-RA (TUBING) ×2 IMPLANT
WIRE SAFE-T 1.5MM-J .035X260CM (WIRE) ×2 IMPLANT

## 2015-03-27 NOTE — Progress Notes (Signed)
   03/27/15 1900  Clinical Encounter Type  Visited With Family  Visit Type ED  Referral From Nurse  Spiritual Encounters  Spiritual Needs Emotional  Stress Factors  Patient Stress Factors Lack of knowledge;Health changes  Family Stress Factors Lack of knowledge;Health changes  Family present with patient when chaplain arrived to ED A4. Offered assistance but none requested. Left fairly quickly to respond to Trauma Level 1.

## 2015-03-27 NOTE — ED Notes (Addendum)
Pt reports generalized abdominal pain since 2 pm today, reports had BM yesterday but today only small one Pain radiates from mid upper abd down to lower abd. Pt is moaning in wheelchair c/o pain.

## 2015-03-27 NOTE — ED Notes (Signed)
Cardiologist at bedside.  

## 2015-03-27 NOTE — H&P (Signed)
Patient ID: Jasmine [name] MRN: [id number], DOB/AGE: 08/24/1924   Admit date: 03/27/2015   Primary Physician: [name], MD Primary Cardiologist: T. Radford Pax, MD / S. Caryl Comes, MD   Pt. Profile:  81 y/o female with a h/o HCM, MVR, persistent AF/Fl, and c/p s/p nonobs cath in 06/2014, who presented to the ED today w/ complaints of c/p and was noted to have inferior ST elevation.  Problem List  Past Medical History  Diagnosis Date  . GERD (gastroesophageal reflux disease)   . Insomnia   . Eczema   . Glaucoma   . HOCM (hypertrophic obstructive cardiomyopathy) ([place])     a. s/p septal myomectomy;  b. 06/2014 Echo: EF 50-55%, mild antsept HK, mild MR, mild MS/MR (bioprosth valve), mildly dil LA, mod TR, PASP 26mmHg.  Marland Kitchen Hyperlipidemia   . Non-obstructive coronary artery disease     a. 06/2014 NSTEMI/Cath: LM nl, LAD nl, LCX nl, RCA 10-98m, EF 45%.  . Blood transfusion   . Chronic kidney disease (CKD), stage III (moderate)   . History of Severe Mitral Regurgitation     a. s/p MVR with pericardial tissue valve;  b. 06/2014 Mild Mitral Stenosis by echo.  . Diverticulosis of colon with hemorrhage 08/12/2012  . History of GI Bleed     a. prevents use of [place].  Marland Kitchen Asthma   . Essential hypertension   . Reflux esophagitis   . Pulmonary HTN ([place])     a. mild to moderate (PASP 40mmHg by echo 06/2014).  Marland Kitchen Aortic insufficiency     a. 06/2014 Echo: Mild AI.  Marland Kitchen Bradycardia     a. 10/2011 s/p SJM Accent DR DC PPM, ser #: [id number].  . Atrial fibrillation and flutter     a. atrial fibrillation s/p DCCV 06-2012;  b. now in chronic atrial fibrillation off anticoagulation due to GI bleed.  . Paroxysmal atrial tachycardia ([place])   . PVCs (premature ventricular contractions)   . Dementia   . Atypical chest pain     Past Surgical History  Procedure Laterality Date  . Cardiac catheterization    . Cardiac valve replacement  2007    MVR  . Myomectomy  2007    septal  . Tonsillectomy    . Appendectomy     . Cholecystectomy    . Tubal ligation    . Hernia repair      "in my stomach"  . Cataract extraction w/ intraocular lens  implant, bilateral    . Cardioversion  07/04/2012    Procedure: CARDIOVERSION;  Surgeon: Sueanne Margarita, MD;  Location: [facility];  Service: Cardiovascular;  Laterality: N/A;  h/p in file drawer   . Colonoscopy    . Colon resection    . Colonoscopy N/A 08/13/2012    Procedure: COLONOSCOPY;  Surgeon: Gatha Mayer, MD;  Location: [facility];  Service: Endoscopy;  Laterality: N/A;  . Permanent pacemaker insertion N/A 10/07/2011    Procedure: PERMANENT PACEMAKER INSERTION;  Surgeon: Deboraha Sprang, MD;  Location: [facility] CATH LAB;  Service: Cardiovascular;  Laterality: N/A;  . Left heart catheterization with coronary angiogram N/A 06/11/2014    Procedure: LEFT HEART CATHETERIZATION WITH CORONARY ANGIOGRAM;  Surgeon: Troy Sine, MD;  Location: [facility] CATH LAB;  Service: Cardiovascular;  Laterality: N/A;     Allergies  Allergies  Allergen Reactions  . Cymbalta [Duloxetine Hcl]     Diarrhea, weakness    HPI  81 y/o female with a h/o hypertrophic obstructive cardiomyopathy status  post septal myomectomy and bioprosthetic mitral valve replacement. She also has a history of persistent atrial fibrillation however is not on oral anticoagulation secondary to prior history of GI bleeds. In 06/2013, she was admitted to [facility] secondary to complaints of chest pain. She ruled in for non-ST elevation MI and underwent cath revealing minimal nonobstructive disease. It was felt that her troponin rise was most likely secondary to demand ischemia in the setting of A. fib with rapid rates and hypertrophic cardiomyopathy. She was readmitted with further c/p and flat trop trend.  Echo showed nl LV fxn and she was medically managed.  She has since f/u with Drs. Turner and Caryl Comes in the outpt setting and has been doing reasonably well.  Her AF rates have been noted to be elevated with  subsequent adjustment in metoprolol dose (11/2014).  Today, she was sitting and watching TV when she developed abdominal and epigastric discomfort w/o associated Ss, that radiated up her chest and into her right shoulder.  Ss persisted for a few hours before she called her dtr, who then came and took her to the [facility] ED.  There, trop was nl, however ECG showed inferior ST elevation.  A code STEMI was activated and she underwent diagnostic cath revealing nonobs dzs.  She continues to c/o moderate abdominal, epigastric, and chest wall tenderness.   Home Medications  Prior to Admission medications   Medication Sig Start Date End Date Taking? Authorizing Provider  acetaminophen (TYLENOL) 500 MG tablet Take 500 mg by mouth every 6 (six) hours as needed. For pain    Historical Provider, MD  albuterol (PROVENTIL HFA;VENTOLIN HFA) 108 (90 BASE) MCG/ACT inhaler Inhale 2 puffs into the lungs every 4 (four) hours as needed for wheezing or shortness of breath. For shortness of breath    Historical Provider, MD  budesonide (PULMICORT) 180 MCG/ACT inhaler Inhale 2 puffs into the lungs as needed (SOB/Wheezing).     Historical Provider, MD  Calcium Carbonate-Vitamin D (CALTRATE 600+D) 600-400 MG-UNIT per tablet Take 1 tablet by mouth daily.    Historical Provider, MD  diltiazem (CARDIZEM CD) 120 MG 24 hr capsule Take 1 capsule (120 mg total) by mouth daily. 06/26/14   Charlynne Cousins, MD  dorzolamide-timolol (COSOPT) 22.3-6.8 MG/ML ophthalmic solution Place 1 drop into both eyes 2 (two) times daily.  07/19/14   Historical Provider, MD  ferrous sulfate 325 (65 FE) MG tablet Take 1 tablet (325 mg total) by mouth daily with breakfast. 10/20/14   Delfina Redwood, MD  fluticasone (FLONASE) 50 MCG/ACT nasal spray Place into both nostrils as needed for allergies or rhinitis.    Historical Provider, MD  hydrocortisone cream 1 % Apply 1 application topically daily.    Historical Provider, MD  lisinopril (PRINIVIL,ZESTRIL)  10 MG tablet Take 1 tablet (10 mg total) by mouth daily. 11/21/14   Deboraha Sprang, MD  loratadine (CLARITIN) 10 MG tablet Take 10 mg by mouth daily.    Historical Provider, MD  LORazepam (ATIVAN) 1 MG tablet Take 1 mg by mouth at bedtime.     Historical Provider, MD  lovastatin (MEVACOR) 20 MG tablet Take 20 mg by mouth at bedtime.    Historical Provider, MD  memantine (NAMENDA) 10 MG tablet Take 10 mg by mouth 2 (two) times daily.    Historical Provider, MD  metoprolol succinate (TOPROL-XL) 50 MG 24 hr tablet Take 1 tablet (50 mg total) by mouth 2 (two) times daily. Take with or immediately following a meal.  11/21/14   Deboraha Sprang, MD  Multiple Vitamins-Minerals (ALIVE WOMENS 50+ PO) Take by mouth daily.    Historical Provider, MD  nitroGLYCERIN (NITROSTAT) 0.4 MG SL tablet Place 1 tablet (0.4 mg total) under the tongue every 5 (five) minutes as needed for chest pain. 06/14/14   Bobby Rumpf [place], PA-C  pantoprazole (PROTONIX) 40 MG tablet Take 1 tablet (40 mg total) by mouth 2 (two) times daily before a meal. 06/26/14   Charlynne Cousins, MD  potassium chloride (K-DUR) 10 MEQ tablet  09/23/14   Historical Provider, MD  potassium chloride (K-DUR,KLOR-CON) 10 MEQ tablet Take 1 tablet (10 mEq total) by mouth daily. 06/26/14   Charlynne Cousins, MD  Probiotic Product (ALIGN PO) Take 1 tablet by mouth daily.    Historical Provider, MD  Simethicone (GAS-X EXTRA STRENGTH) 125 MG CAPS Take 125 mg by mouth as needed (gas).     Historical Provider, MD  vitamin E 400 UNIT capsule Take 400 Units by mouth daily.    Historical Provider, MD    Family History  Family History  Problem Relation Age of Onset  . CVA Mother   . Hypertension Mother   . CVA Brother   . Hypertension Brother     Social History  Social History   Social History  . Marital Status: Widowed    Spouse Name: N/A  . Number of Children: 3  . Years of Education: N/A   Occupational History  . Not on file.   Social History Main  Topics  . Smoking status: Former Smoker    Types: Cigarettes    Quit date: 06/07/1974  . Smokeless tobacco: Former Systems developer    Types: Snuff    Quit date: 04/23/1975     Comment: 10/05/11 "  . Alcohol Use: No     Comment: 10/05/11 "used to drink; quit a long time ago"  . Drug Use: No  . Sexual Activity: No   Other Topics Concern  . Not on file   Social History Narrative     Review of Systems General:  No chills, fever, night sweats or weight changes.  Cardiovascular:  +++ chest pain, no dyspnea on exertion, edema, orthopnea, palpitations, paroxysmal nocturnal dyspnea. Dermatological: No rash, lesions/masses Respiratory: No cough, dyspnea Urologic: No hematuria, dysuria Abdominal:   +++ abd abd epigastric tenderness.  No nausea, vomiting, diarrhea, bright red blood per rectum, melena, or hematemesis Neurologic:  No visual changes, wkns, changes in mental status. All other systems reviewed and are otherwise negative except as noted above.  Physical Exam  Blood pressure 171/79, pulse 74, temperature 97.8 F (36.6 C), temperature source Oral, resp. rate 20, SpO2 100 %.  General: Pleasant, NAD Psych: Normal affect. Neuro: Alert and oriented X 3. Moves all extremities spontaneously. HEENT: Normal  Neck: Supple without bruits or JVD. Lungs:  Resp regular and unlabored, CTA. Heart: IR, IR no s3, s4, or murmurs. Abdomen: diffuse abdominal, epigastric, and chest wall tenderness is present with light palpation.  Soft, non-distended, BS + x 4.  Extremities: No clubbing, cyanosis or edema. DP/PT/Radials 2+ and equal bilaterally.  Labs  Troponin [facility])  Recent Labs  03/27/15 1830  TROPIPOC 0.03    Lab Results  Component Value Date   WBC 14.9* 03/27/2015   HGB 13.5 03/27/2015   HCT 41.4 03/27/2015   MCV 91.4 03/27/2015   PLT 211 03/27/2015    Radiology/Studies  No results found.  ECG  AF, 88, rightward axis, LBBB,  inf ST elevation with lat ST  dep.  ASSESSMENT AND PLAN  1.  Acute chest/epigastric/abdominal discomfort:  ECG abnl with inferior ST elevation.  S/P cath revealing nonobs dzs.  She continues to c/o mild epigastric/abd/chest wall pain and tenderness.  Admit to stepdown.  Cont home meds including PPI. Will try GI cocktail tonight.  If pain ongoing, will need abd imaging and IM w/u.  2.  Hypertrophic Cardiomyopathy:  Nl EF by echo in January.  Euvolemic on exam.  Cont home meds.  3.  Essential HTN:  BP moderately elevated currently.  Resume home meds.  Follow.  4.  HL:  Cont home dose of lovastatin.  5.  CKD III:  F/U bmet.   Signed, Murray Hodgkins, NP 03/27/2015, 7:20 PM   I have seen, examined and evaluated the patient this PM in the ER prior to Emergency Cardiac Catheterization then later along with Mr. Sharolyn Douglas, NP.  After reviewing all the available data and chart,  I agree with his findings, examination as well as impression recommendations.  She was taken urgently to the cardiac catheterization lab due to concerning EKG with more pronounced reciprocal ST depressions in leads 1 and aVL but with ST elevations in 3 and aVF that were somewhat subtle but still evident. Thankfully her cardiac catheterization did not show any significant coronary disease, and no change from February.  She will be admitted for evaluation and treatment of her epigastric pain. We will get a hospitalist consultation to assist.   Leonie Man, M.D., M.S. Interventional Cardiologist   Pager # [phone]

## 2015-03-27 NOTE — ED Provider Notes (Signed)
CSN: [id number]     Arrival date & time 03/27/15  1758 History   None    Chief Complaint  Patient presents with  . Abdominal Pain     (Consider location/radiation/quality/duration/timing/severity/associated sxs/prior Treatment) HPI   DAYLYNN STUMPP is a 81 y.o. female who presents for evaluation of abdominal pain and bloating sensation which started at 2 PM today. She's not had anything to eat or drink recently. She denies chest pain, shortness of breath, or back pain. She feels weak and tired. No recent interventions by her PCP or cardiologist.  Level V Caveat- severe illness   Past Medical History  Diagnosis Date  . GERD (gastroesophageal reflux disease)   . Insomnia   . Eczema   . Glaucoma   . HOCM (hypertrophic obstructive cardiomyopathy) ([place])     a. s/p septal myomectomy;  b. 06/2014 Echo: EF 50-55%, mild antsept HK, mild MR, mild MS/MR (bioprosth valve), mildly dil LA, mod TR, PASP 51mmHg.  Marland Kitchen Hyperlipidemia   . Non-obstructive coronary artery disease     a. 06/2014 NSTEMI/Cath: LM nl, LAD nl, LCX nl, RCA 10-59m, EF 45%.  . Blood transfusion   . Chronic kidney disease (CKD), stage III (moderate)   . History of Severe Mitral Regurgitation     a. s/p MVR with pericardial tissue valve;  b. 06/2014 Mild Mitral Stenosis by echo.  . Diverticulosis of colon with hemorrhage 08/12/2012  . History of GI Bleed     a. prevents use of [place].  Marland Kitchen Asthma   . Essential hypertension   . Reflux esophagitis   . Pulmonary HTN ([place])     a. mild to moderate (PASP 17mmHg by echo 06/2014).  Marland Kitchen Aortic insufficiency     a. 06/2014 Echo: Mild AI.  Marland Kitchen Bradycardia     a. 10/2011 s/p SJM Accent DR DC PPM, ser #: [id number].  . Atrial fibrillation and flutter     a. atrial fibrillation s/p DCCV 06-2012;  b. now in chronic atrial fibrillation off anticoagulation due to GI bleed.  . Paroxysmal atrial tachycardia ([place])   . PVCs (premature ventricular contractions)   . Dementia   . Atypical chest pain    Past  Surgical History  Procedure Laterality Date  . Cardiac catheterization    . Cardiac valve replacement  2007    MVR  . Myomectomy  2007    septal  . Tonsillectomy    . Appendectomy    . Cholecystectomy    . Tubal ligation    . Hernia repair      "in my stomach"  . Cataract extraction w/ intraocular lens  implant, bilateral    . Cardioversion  07/04/2012    Procedure: CARDIOVERSION;  Surgeon: Sueanne Margarita, MD;  Location: [facility];  Service: Cardiovascular;  Laterality: N/A;  h/p in file drawer   . Colonoscopy    . Colon resection    . Colonoscopy N/A 08/13/2012    Procedure: COLONOSCOPY;  Surgeon: Gatha Mayer, MD;  Location: [facility];  Service: Endoscopy;  Laterality: N/A;  . Permanent pacemaker insertion N/A 10/07/2011    Procedure: PERMANENT PACEMAKER INSERTION;  Surgeon: Deboraha Sprang, MD;  Location: [facility] CATH LAB;  Service: Cardiovascular;  Laterality: N/A;  . Left heart catheterization with coronary angiogram N/A 06/11/2014    Procedure: LEFT HEART CATHETERIZATION WITH CORONARY ANGIOGRAM;  Surgeon: Troy Sine, MD;  Location: [facility] C[facility] LAB;  Service: Cardiovascular;  Laterality: N/A;   Family History  Problem Relation Age  of Onset  . CVA Mother   . Hypertension Mother   . CVA Brother   . Hypertension Brother    Social History  Substance Use Topics  . Smoking status: Former Smoker    Types: Cigarettes    Quit date: 06/07/1974  . Smokeless tobacco: Former Systems developer    Types: Snuff    Quit date: 04/23/1975     Comment: 10/05/11 "  . Alcohol Use: No     Comment: 10/05/11 "used to drink; quit a long time ago"   OB History    No data available     Review of Systems  Unable to perform ROS: Acuity of condition  All other systems reviewed and are negative.     Allergies  Cymbalta  Home Medications   Prior to Admission medications   Medication Sig Start Date End Date Taking? Authorizing Provider  acetaminophen (TYLENOL) 500 MG tablet Take 500 mg by mouth every 6  (six) hours as needed. For pain    Historical Provider, MD  albuterol (PROVENTIL HFA;VENTOLIN HFA) 108 (90 BASE) MCG/ACT inhaler Inhale 2 puffs into the lungs every 4 (four) hours as needed for wheezing or shortness of breath. For shortness of breath    Historical Provider, MD  budesonide (PULMICORT) 180 MCG/ACT inhaler Inhale 2 puffs into the lungs as needed (SOB/Wheezing).     Historical Provider, MD  Calcium Carbonate-Vitamin D (CALTRATE 600+D) 600-400 MG-UNIT per tablet Take 1 tablet by mouth daily.    Historical Provider, MD  diltiazem (CARDIZEM CD) 120 MG 24 hr capsule Take 1 capsule (120 mg total) by mouth daily. 06/26/14   Charlynne Cousins, MD  dorzolamide-timolol (COSOPT) 22.3-6.8 MG/ML ophthalmic solution Place 1 drop into both eyes 2 (two) times daily.  07/19/14   Historical Provider, MD  ferrous sulfate 325 (65 FE) MG tablet Take 1 tablet (325 mg total) by mouth daily with breakfast. 10/20/14   Delfina Redwood, MD  fluticasone (FLONASE) 50 MCG/ACT nasal spray Place into both nostrils as needed for allergies or rhinitis.    Historical Provider, MD  hydrocortisone cream 1 % Apply 1 application topically daily.    Historical Provider, MD  lisinopril (PRINIVIL,ZESTRIL) 10 MG tablet Take 1 tablet (10 mg total) by mouth daily. 11/21/14   Deboraha Sprang, MD  loratadine (CLARITIN) 10 MG tablet Take 10 mg by mouth daily.    Historical Provider, MD  LORazepam (ATIVAN) 1 MG tablet Take 1 mg by mouth at bedtime.     Historical Provider, MD  lovastatin (MEVACOR) 20 MG tablet Take 20 mg by mouth at bedtime.    Historical Provider, MD  memantine (NAMENDA) 10 MG tablet Take 10 mg by mouth 2 (two) times daily.    Historical Provider, MD  metoprolol succinate (TOPROL-XL) 50 MG 24 hr tablet Take 1 tablet (50 mg total) by mouth 2 (two) times daily. Take with or immediately following a meal. 11/21/14   Deboraha Sprang, MD  Multiple Vitamins-Minerals (ALIVE WOMENS 50+ PO) Take by mouth daily.    Historical  Provider, MD  nitroGLYCERIN (NITROSTAT) 0.4 MG SL tablet Place 1 tablet (0.4 mg total) under the tongue every 5 (five) minutes as needed for chest pain. 06/14/14   Bobby Rumpf [place], PA-C  pantoprazole (PROTONIX) 40 MG tablet Take 1 tablet (40 mg total) by mouth 2 (two) times daily before a meal. 06/26/14   Charlynne Cousins, MD  potassium chloride (K-DUR) 10 MEQ tablet  09/23/14   Historical Provider, MD  potassium chloride (K-DUR,KLOR-CON) 10 MEQ tablet Take 1 tablet (10 mEq total) by mouth daily. 06/26/14   Charlynne Cousins, MD  Probiotic Product (ALIGN PO) Take 1 tablet by mouth daily.    Historical Provider, MD  Simethicone (GAS-X EXTRA STRENGTH) 125 MG CAPS Take 125 mg by mouth as needed (gas).     Historical Provider, MD  vitamin E 400 UNIT capsule Take 400 Units by mouth daily.    Historical Provider, MD   BP 176/96 mmHg  Pulse 89  Temp(Src) 98.4 F (36.9 C) (Oral)  Resp 20  Ht 5\' 8"  (1.727 m)  Wt 158 lb 4.6 oz (71.8 kg)  BMI 24.07 kg/m2  SpO2 100% Physical Exam  Constitutional: She is oriented to person, place, and time. She appears well-developed. She appears distressed (She is uncomfortable).  Elderly, frail  HENT:  Head: Normocephalic and atraumatic.  Right Ear: External ear normal.  Left Ear: External ear normal.  Eyes: Conjunctivae and EOM are normal. Pupils are equal, round, and reactive to light.  Neck: Normal range of motion and phonation normal. Neck supple.  Cardiovascular: Normal rate, regular rhythm and normal heart sounds.   Pulmonary/Chest: Effort normal and breath sounds normal. She exhibits no bony tenderness.  Abdominal: Soft. She exhibits distension. There is tenderness (Epigastric, mild).  Musculoskeletal: Normal range of motion. She exhibits no edema.  Neurological: She is alert and oriented to person, place, and time. No cranial nerve deficit or sensory deficit. She exhibits normal muscle tone. Coordination normal.  Skin: Skin is warm, dry and intact.   Psychiatric: She has a normal mood and affect. Her behavior is normal. Judgment and thought content normal.  Nursing note and vitals reviewed.   ED Course  Procedures (including critical care time)  Code STEMI page out at time of initial EKG evaluation  18:18-  discussing case with cardiology, to evaluate patient and take her to the Cath Lab Ellyn Hack).  Medications  LORazepam (ATIVAN) tablet 1 mg (1 mg Oral Given 03/27/15 2310)  albuterol (PROVENTIL) (2.5 MG/3ML) 0.083% nebulizer solution 2.5 mg (not administered)  memantine (NAMENDA) tablet 10 mg (not administered)  calcium-vitamin D (OSCAL WITH D) 500-200 MG-UNIT per tablet 1 tablet (1 tablet Oral Given 03/27/15 2312)  simethicone (MYLICON) chewable tablet 80 mg (not administered)  budesonide (PULMICORT) nebulizer solution 0.25 mg (not administered)  vitamin E capsule 400 Units (not administered)  nitroGLYCERIN (NITROSTAT) SL tablet 0.4 mg (not administered)  diltiazem (CARDIZEM CD) 24 hr capsule 120 mg (120 mg Oral Given 03/27/15 2312)  pantoprazole (PROTONIX) EC tablet 40 mg (not administered)  potassium chloride (K-DUR,KLOR-CON) CR tablet 10 mEq (not administered)  dorzolamide-timolol (COSOPT) 22.3-6.8 MG/ML ophthalmic solution 1 drop (not administered)  loratadine (CLARITIN) tablet 10 mg (10 mg Oral Given 03/27/15 2312)  ferrous sulfate tablet 325 mg (not administered)  lisinopril (PRINIVIL,ZESTRIL) tablet 10 mg (10 mg Oral Given 03/27/15 2313)  metoprolol succinate (TOPROL-XL) 24 hr tablet 50 mg (50 mg Oral Given 03/27/15 2311)  fluticasone (FLONASE) 50 MCG/ACT nasal spray 2 spray (not administered)  sodium chloride 0.9 % injection 3 mL (not administered)  sodium chloride 0.9 % injection 3 mL (not administered)  0.9 %  sodium chloride infusion (not administered)  acetaminophen (TYLENOL) tablet 650 mg (not administered)  0.9% sodium chloride infusion (3 mL/kg/hr  71.8 kg Intravenous Rate/Dose Change 03/27/15 2036)   ondansetron (ZOFRAN) injection 4 mg (4 mg Intravenous Given 03/27/15 2308)  gi cocktail (Maalox,Lidocaine,Donnatal) (30 mLs Oral Given 03/27/15 2037)  morphine 2 MG/ML  injection 2 mg (not administered)  morphine 4 MG/ML injection 4 mg (4 mg Intravenous Given 03/27/15 1830)  aspirin chewable tablet 324 mg (324 mg Oral Given 03/27/15 1828)  heparin bolus via infusion 4,000 Units (4,000 Units Intravenous Given 03/27/15 1833)    Patient Vitals for the past 24 hrs:  BP Temp Temp src Pulse Resp SpO2 Height Weight  03/27/15 2311 (!) 176/96 mmHg - - 89 - - - -  03/27/15 2100 (!) 152/91 mmHg - - - - - - -  03/27/15 2000 - 98.4 F (36.9 C) Oral 97 - - 5\' 8"  (1.727 m) 158 lb 4.6 oz (71.8 kg)  03/27/15 1851 - - - - - 100 % - -  03/27/15 1835 - - - 74 - 100 % - -  03/27/15 1834 171/79 mmHg - - 70 20 100 % - -  03/27/15 1832 171/79 mmHg - - - - - - -  03/27/15 1803 173/83 mmHg 97.8 F (36.6 C) Oral 78 20 100 % - -      CRITICAL CARE Performed by: Daleen Bo L Total critical care time: 35 minutes Critical care time was exclusive of separately billable procedures and treating other patients. Critical care was necessary to treat or prevent imminent or life-threatening deterioration. Critical care was time spent personally by me on the following activities: development of treatment plan with patient and/or surrogate as well as nursing, discussions with consultants, evaluation of patient's response to treatment, examination of patient, obtaining history from patient or surrogate, ordering and performing treatments and interventions, ordering and review of laboratory studies, ordering and review of radiographic studies, pulse oximetry and re-evaluation of patient's condition.   Labs Review Labs Reviewed  LIPASE, BLOOD - Abnormal; Notable for the following:    Lipase 186 (*)    All other components within normal limits  COMPREHENSIVE METABOLIC PANEL - Abnormal; Notable for the following:     Glucose, Bld 122 (*)    Creatinine, Ser 1.09 (*)    AST 42 (*)    GFR calc non Af Amer 44 (*)    GFR calc Af Amer 51 (*)    All other components within normal limits  CBC - Abnormal; Notable for the following:    WBC 14.9 (*)    All other components within normal limits  URINALYSIS, ROUTINE W REFLEX MICROSCOPIC (NOT AT [facility]) - Abnormal; Notable for the following:    Leukocytes, UA MODERATE (*)    All other components within normal limits  PROTIME-INR - Abnormal; Notable for the following:    Prothrombin Time 16.1 (*)    All other components within normal limits  APTT - Abnormal; Notable for the following:    aPTT >200 (*)    All other components within normal limits  DIFFERENTIAL - Abnormal; Notable for the following:    Neutro Abs 12.5 (*)    All other components within normal limits  URINE MICROSCOPIC-ADD ON - Abnormal; Notable for the following:    Squamous Epithelial / LPF FEW (*)    Bacteria, UA FEW (*)    Casts HYALINE CASTS (*)    All other components within normal limits  POCT I-STAT, CHEM 8 - Abnormal; Notable for the following:    Glucose, Bld 129 (*)    All other components within normal limits  MRSA PCR SCREENING  CK TOTAL AND CKMB (NOT AT [facility])  LIPID PANEL  TROPONIN I  CBC  HEMOGLOBIN P9X  BASIC METABOLIC PANEL  I-STAT TROPOININ, ED  POCT I-STAT TROPONIN I  POCT ACTIVATED CLOTTING TIME    Imaging Review Dg Chest Port 1 View  03/27/2015  CLINICAL DATA:  Acute onset of generalized chest pain. Initial encounter. EXAM: PORTABLE CHEST 1 VIEW COMPARISON:  Chest radiograph performed 06/22/2014 FINDINGS: The lungs are well-aerated. Minimal left basilar atelectasis is noted. There is no evidence of pleural effusion or pneumothorax. The cardiomediastinal silhouette is borderline enlarged. The patient is status post median sternotomy. A pacemaker is noted overlying the left chest wall, with leads ending overlying the right atrium and right ventricle. No acute osseous  abnormalities are seen. IMPRESSION: Minimal left basilar atelectasis noted.  Borderline cardiomegaly. Electronically Signed   By: Garald Balding M.D.   On: 03/27/2015 22:32   I have personally reviewed and evaluated these images and lab results as part of my medical decision-making.   EKG Interpretation   Date/Time:  Thursday March 27 2015 18:08:54 EDT Ventricular Rate:  88 PR Interval:    QRS Duration: 142 QT Interval:  458 QTC Calculation: 554 R Axis:   101 Text Interpretation:   Critical Test Result: AV Block , STEMI Sinus  rhythm with A-V dissociation and Wide QRS rhythm with occasional  ventricular-paced complexes and with occasional Premature ventricular  complexes Rightward axis Non-specific intra-ventricular conduction block  Inferior infarct , possibly acute Cannot rule out Anterior infarct , age  undetermined T wave abnormality, consider lateral ischemia ** ** ACUTE MI  / STEMI ** ** Consider right ventricular involvement in acute inferior  infarct Abnormal ECG Confirmed by Cionna Collantes  MD, Vira Agar ([id number]) on 03/27/2015  6:16:29 PM      MDM   Final diagnoses:  ST elevation myocardial infarction (STEMI), unspecified artery (HCC)    Abdominal pain with EKG evidence for STEMI. She will require evaluation by cardiology, emergently, and likely cardiac catheterization and intervention  Nursing Notes Reviewed/ Care Coordinated, and agree without changes. Applicable Imaging Reviewed.  Interpretation of Laboratory Data incorporated into ED treatment  Plan: Admit    Daleen Bo, MD 03/27/15 [phone]

## 2015-03-27 NOTE — Progress Notes (Signed)
Lab called with critical PTT > 20.   Result called to Dr Ellyn Hack. No new orders received.  Will  continue to monitor.

## 2015-03-27 NOTE — Progress Notes (Signed)
ANTICOAGULATION CONSULT NOTE - Initial Consult  Pharmacy Consult for heparin Indication: chest pain/ACS  Allergies  Allergen Reactions  . Cymbalta [Duloxetine Hcl]     Diarrhea, weakness    Patient Measurements:   Heparin Dosing Weight: 74.9 kg  Vital Signs: Temp: 97.8 F (36.6 C) (10/20 1803) Temp Source: Oral (10/20 1803) BP: 171/79 mmHg (10/20 1834) Pulse Rate: 74 (10/20 1835)  Labs: No results for input(s): HGB, HCT, PLT, APTT, LABPROT, INR, HEPARINUNFRC, CREATININE, CKTOTAL, CKMB, TROPONINI in the last 72 hours.  CrCl cannot be calculated (Unknown ideal weight.).   Medical History: Past Medical History  Diagnosis Date  . GERD (gastroesophageal reflux disease)   . Insomnia   . Eczema   . Glaucoma   . HOCM (hypertrophic obstructive cardiomyopathy) ([place])     a. s/p septal myomectomy;  b. 06/2014 Echo: EF 50-55%, mild antsept HK, mild MR, mild MS/MR (bioprosth valve), mildly dil LA, mod TR, PASP 49mmHg.  . High cholesterol   . Non-obstructive coronary artery disease     a. 06/2014 NSTEMI/Cath: LM nl, LAD nl, LCX nl, RCA 10-40m, EF 45%.  . Blood transfusion   . Chronic kidney disease (CKD), stage III (moderate)   . History of Severe Mitral Regurgitation     a. s/p MVR with pericardial tissue valve  . Diverticulosis of colon with hemorrhage 08/12/2012  . History of GI Bleed   . Asthma   . Hypertension   . Reflux esophagitis   . Pulmonary HTN ([place])     a. mild to moderate (PASP 76mmHg by echo 06/2014).  Marland Kitchen Aortic insufficiency     a. Not seen on echo 06/2014.  . Bradycardia     a. 10/2011 s/p SJM Accent DR DC PPM, ser #: [id number].  . Atrial fibrillation and flutter     a. atrial fibrillation s/p DCCV 06-2012;  b. now in chronic atrial fibrillation off anticoagulation due to GI bleed.  . Paroxysmal atrial tachycardia ([place])   . PVCs (premature ventricular contractions)   . DCM (dilated cardiomyopathy) ([place])     a. 50% echo 06/2012;  b. 06/2014 Echo: EF 50-55%.  Marland Kitchen Heart  murmur   . Dementia   . Atypical chest pain     Medications:  Prescriptions prior to admission  Medication Sig Dispense Refill Last Dose  . acetaminophen (TYLENOL) 500 MG tablet Take 500 mg by mouth every 6 (six) hours as needed. For pain   Taking  . albuterol (PROVENTIL HFA;VENTOLIN HFA) 108 (90 BASE) MCG/ACT inhaler Inhale 2 puffs into the lungs every 4 (four) hours as needed for wheezing or shortness of breath. For shortness of breath   Taking  . budesonide (PULMICORT) 180 MCG/ACT inhaler Inhale 2 puffs into the lungs as needed (SOB/Wheezing).    Taking  . Calcium Carbonate-Vitamin D (CALTRATE 600+D) 600-400 MG-UNIT per tablet Take 1 tablet by mouth daily.   Taking  . diltiazem (CARDIZEM CD) 120 MG 24 hr capsule Take 1 capsule (120 mg total) by mouth daily. 30 capsule 0 Taking  . dorzolamide-timolol (COSOPT) 22.3-6.8 MG/ML ophthalmic solution Place 1 drop into both eyes 2 (two) times daily.    Taking  . ferrous sulfate 325 (65 FE) MG tablet Take 1 tablet (325 mg total) by mouth daily with breakfast. 30 tablet 1 Taking  . fluticasone (FLONASE) 50 MCG/ACT nasal spray Place into both nostrils as needed for allergies or rhinitis.   Taking  . hydrocortisone cream 1 % Apply 1 application topically daily.   Taking  .  lisinopril (PRINIVIL,ZESTRIL) 10 MG tablet Take 1 tablet (10 mg total) by mouth daily. 30 tablet 3   . loratadine (CLARITIN) 10 MG tablet Take 10 mg by mouth daily.   Taking  . LORazepam (ATIVAN) 1 MG tablet Take 1 mg by mouth at bedtime.    Taking  . lovastatin (MEVACOR) 20 MG tablet Take 20 mg by mouth at bedtime.   Taking  . memantine (NAMENDA) 10 MG tablet Take 10 mg by mouth 2 (two) times daily.   Taking  . metoprolol succinate (TOPROL-XL) 50 MG 24 hr tablet Take 1 tablet (50 mg total) by mouth 2 (two) times daily. Take with or immediately following a meal. 60 tablet 5   . Multiple Vitamins-Minerals (ALIVE WOMENS 50+ PO) Take by mouth daily.   Taking  . nitroGLYCERIN (NITROSTAT)  0.4 MG SL tablet Place 1 tablet (0.4 mg total) under the tongue every 5 (five) minutes as needed for chest pain. 10 tablet 2 Taking  . pantoprazole (PROTONIX) 40 MG tablet Take 1 tablet (40 mg total) by mouth 2 (two) times daily before a meal. 60 tablet 3 Taking  . potassium chloride (K-DUR) 10 MEQ tablet    Taking  . potassium chloride (K-DUR,KLOR-CON) 10 MEQ tablet Take 1 tablet (10 mEq total) by mouth daily. 30 tablet 0 Taking  . Probiotic Product (ALIGN PO) Take 1 tablet by mouth daily.   Taking  . Simethicone (GAS-X EXTRA STRENGTH) 125 MG CAPS Take 125 mg by mouth as needed (gas).    Taking  . vitamin E 400 UNIT capsule Take 400 Units by mouth daily.   Taking    Assessment: 36 yoF w/ hx of afib (s/p pacer) and MV replacement in ED with epigastric discomfort and fatigue. EKG showed inferior ST elevations, code STEMI called. Pharmacy consulted to dose heparin gtt.  Trop 0.03, other labs pending  Goal of Therapy:  Heparin level 0.3-0.7 units/ml Monitor platelets by anticoagulation protocol: Yes   Plan:  Give 4000 units bolus x 1 Start heparin infusion at 900 units/hr Check anti-Xa level in 8 hours and daily while on heparin Continue to monitor H&H and platelets  Governor Specking, PharmD Clinical Pharmacy Resident Pager: [phone] 03/27/2015,6:39 PM

## 2015-03-27 NOTE — ED Notes (Signed)
Pt prepared for transport to cath lab.  

## 2015-03-27 NOTE — ED Notes (Signed)
Family at bedside. 

## 2015-03-28 ENCOUNTER — Encounter (HOSPITAL_COMMUNITY): Payer: Self-pay | Admitting: Cardiology

## 2015-03-28 ENCOUNTER — Inpatient Hospital Stay (HOSPITAL_COMMUNITY): Payer: Commercial Managed Care - HMO

## 2015-03-28 LAB — HEPATIC FUNCTION PANEL
ALT: 27 U/L (ref 14–54)
AST: 40 U/L (ref 15–41)
Albumin: 3.3 g/dL — ABNORMAL LOW (ref 3.5–5.0)
Alkaline Phosphatase: 47 U/L (ref 38–126)
BILIRUBIN DIRECT: 0.2 mg/dL (ref 0.1–0.5)
BILIRUBIN INDIRECT: 0.7 mg/dL (ref 0.3–0.9)
Total Bilirubin: 0.9 mg/dL (ref 0.3–1.2)
Total Protein: 6.1 g/dL — ABNORMAL LOW (ref 6.5–8.1)

## 2015-03-28 LAB — CBC
HCT: 38.2 % (ref 36.0–46.0)
HEMOGLOBIN: 12.4 g/dL (ref 12.0–15.0)
MCH: 29.5 pg (ref 26.0–34.0)
MCHC: 32.5 g/dL (ref 30.0–36.0)
MCV: 90.7 fL (ref 78.0–100.0)
PLATELETS: 189 10*3/uL (ref 150–400)
RBC: 4.21 MIL/uL (ref 3.87–5.11)
RDW: 14.6 % (ref 11.5–15.5)
WBC: 11.5 10*3/uL — AB (ref 4.0–10.5)

## 2015-03-28 LAB — BASIC METABOLIC PANEL
Anion gap: 9 (ref 5–15)
BUN: 6 mg/dL (ref 6–20)
CALCIUM: 8.9 mg/dL (ref 8.9–10.3)
CO2: 26 mmol/L (ref 22–32)
CREATININE: 0.91 mg/dL (ref 0.44–1.00)
Chloride: 104 mmol/L (ref 101–111)
GFR, EST NON AFRICAN AMERICAN: 55 mL/min — AB (ref >60)
Glucose, Bld: 127 mg/dL — ABNORMAL HIGH (ref 65–99)
Potassium: 4.3 mmol/L (ref 3.5–5.1)
SODIUM: 139 mmol/L (ref 135–145)

## 2015-03-28 LAB — AMYLASE: AMYLASE: 134 U/L — AB (ref 28–100)

## 2015-03-28 LAB — HEMOGLOBIN A1C
HEMOGLOBIN A1C: 5.6 % (ref 4.8–5.6)
Mean Plasma Glucose: 114 mg/dL

## 2015-03-28 LAB — LIPASE, BLOOD: LIPASE: 104 U/L — AB (ref 11–51)

## 2015-03-28 MED ORDER — SODIUM CHLORIDE 0.9 % IV SOLN
INTRAVENOUS | Status: DC
  Administered 2015-03-28: 125 mL/h via INTRAVENOUS
  Administered 2015-03-28 – 2015-03-30 (×4): via INTRAVENOUS

## 2015-03-28 MED ORDER — IOHEXOL 300 MG/ML  SOLN
100.0000 mL | Freq: Once | INTRAMUSCULAR | Status: AC | PRN
  Administered 2015-03-28: 100 mL via INTRAVENOUS

## 2015-03-28 MED ORDER — METOPROLOL TARTRATE 1 MG/ML IV SOLN: 2.5000 mg | INTRAVENOUS | Status: DC | PRN

## 2015-03-28 MED ORDER — PHENOL 1.4 % MT LIQD
1.0000 | OROMUCOSAL | Status: DC | PRN
  Administered 2015-03-28: 1 via OROMUCOSAL
  Filled 2015-03-28 (×3): qty 177

## 2015-03-28 MED ORDER — IOHEXOL 300 MG/ML  SOLN
25.0000 mL | INTRAMUSCULAR | Status: AC
  Administered 2015-03-28: 25 mL via ORAL

## 2015-03-28 MED ORDER — HYDRALAZINE HCL 20 MG/ML IJ SOLN: 10.0000 mg | INTRAMUSCULAR | Status: DC | PRN

## 2015-03-28 MED ORDER — ENOXAPARIN SODIUM 40 MG/0.4ML ~~LOC~~ SOLN
40.0000 mg | SUBCUTANEOUS | Status: DC
  Administered 2015-03-28 – 2015-04-02 (×6): 40 mg via SUBCUTANEOUS
  Filled 2015-03-28 (×6): qty 0.4

## 2015-03-28 MED ORDER — LIDOCAINE HCL (PF) 1 % IJ SOLN
INTRAMUSCULAR | Status: DC | PRN
  Administered 2015-03-27: 20 mL

## 2015-03-28 MED ORDER — ONDANSETRON HCL 4 MG/2ML IJ SOLN
4.0000 mg | Freq: Once | INTRAMUSCULAR | Status: AC
  Administered 2015-03-28: 4 mg via INTRAVENOUS
  Filled 2015-03-28: qty 2

## 2015-03-28 MED ORDER — METOPROLOL TARTRATE 1 MG/ML IV SOLN
5.0000 mg | Freq: Four times a day (QID) | INTRAVENOUS | Status: DC
  Administered 2015-03-28 – 2015-03-29 (×8): 5 mg via INTRAVENOUS
  Filled 2015-03-28 (×10): qty 5

## 2015-03-28 MED ORDER — SODIUM CHLORIDE 0.9 % IV BOLUS (SEPSIS)
250.0000 mL | Freq: Once | INTRAVENOUS | Status: AC
  Administered 2015-03-28: 250 mL via INTRAVENOUS

## 2015-03-28 MED FILL — Lidocaine HCl Local Preservative Free (PF) Inj 1%: INTRAMUSCULAR | Qty: 30 | Status: AC

## 2015-03-28 NOTE — Consult Note (Signed)
[facility] Medical Consultation  Jasmine [name] YCX:[id number] DOB: 1925/03/26 DOA: 03/27/2015 PCP: [name], MD   Requesting physician: Glenetta Hew  Date of consultation: 03/28/2015 Reason for consultation: continued epigastic/abdominal, cath unremarkable 03/27/15  Impression/Recommendations Principal Problem:   Epigastric pain Active Problems:   Chronic atrial fibrillation ([place])   History of mitral valve replacement with bioprosthetic valve   IHSS (idiopathic hypertrophic subaortic stenosis) (HCC)   Chronic kidney disease (CKD), stage III (moderate)   Essential hypertension   Abnormal finding on EKG    [facility] will assume primary inpatient care of this patient.  Chief Complaint: abdominal pain, nausea  HPI:     81yo female with pmh of HCM, stage III CKD, persistant afib not on anticoagulation secondary to hx of GI bleed, pacemaker, GERD. Pt presented to ED on 03/27/15 with complaints of abdominal and epigastric pain since Wednesday evening.  Pt noted to have inferior ST elevation on EKG done in ED. She was admitted by cardiology and underwent cardiac cath last evening. Cath showed normal CAD and normal systolic function despite abnormal ekg. [facility] has been asked to consult to further evaluate pt's abdominal pain.      Pt describes onset of generalized abdominal pain radiating to epigastic area starting while watching tv. Pain is associated with nausea. She does report normal BM on 10/19, but since then only very small bm and unable to pass gas. Denies any hematemesis, melena or hematachezia. No recent fever or chills.   Review of Systems  Constitutional: Negative for fever, chills weight loss HENT: Negative for ear pain, nosebleeds, congestion, facial swelling, rhinorrhea, neck pain, neck stiffness and ear discharge.   Respiratory: Negative for cough, shortness of breath, wheezing  Cardiovascular: Negative for chest pain, palpitations and  leg swelling.  Gastrointestinal: + abdominal pain, nausea and constipation. No BRBPR, melena or HC Genitourinary: Negative for dysuria, urgency, frequency, hematuria Musculoskeletal: Negative for back pain or joint pain Neurological: Negative for dizziness, seizures, syncope, focal weakness,  numbness and headaches.  Hematological: Does not bruise/bleed easily.  Psychiatric/Behavioral: Negative for hallucinations, confusion, dysphoric mood   Past Medical History  Diagnosis Date  . GERD (gastroesophageal reflux disease)   . Insomnia   . Eczema   . Glaucoma   . HOCM (hypertrophic obstructive cardiomyopathy) ([place])     a. s/p septal myomectomy;  b. 06/2014 Echo: EF 50-55%, mild antsept HK, mild MR, mild MS/MR (bioprosth valve), mildly dil LA, mod TR, PASP 64mmHg.  Marland Kitchen Hyperlipidemia   . Non-obstructive coronary artery disease     a. 06/2014 NSTEMI/Cath: LM nl, LAD nl, LCX nl, RCA 10-14m, EF 45%.  . Blood transfusion   . Chronic kidney disease (CKD), stage III (moderate)   . History of Severe Mitral Regurgitation     a. s/p MVR with pericardial tissue valve;  b. 06/2014 Mild Mitral Stenosis by echo.  . Diverticulosis of colon with hemorrhage 08/12/2012  . History of GI Bleed     a. prevents use of [place].  Marland Kitchen Asthma   . Essential hypertension   . Reflux esophagitis   . Pulmonary HTN ([place])     a. mild to moderate (PASP 67mmHg by echo 06/2014).  Marland Kitchen Aortic insufficiency     a. 06/2014 Echo: Mild AI.  Marland Kitchen Bradycardia     a. 10/2011 s/p SJM Acc[facility] DR DC PPM, ser #: [id number].  . Atrial fibrillation and flutter     a. atrial fibrillation s/p DCCV 06-2012;  b. now in chronic atrial  fibrillation off anticoagulation due to GI bleed.  . Paroxysmal atrial tachycardia ([place])   . PVCs (premature ventricular contractions)   . Dementia   . Atypical chest pain    Past Surgical History  Procedure Laterality Date  . Cardiac catheterization    . Cardiac valve replacement  2007    MVR  . Myomectomy  2007     septal  . Tonsillectomy    . Appendectomy    . Cholecystectomy    . Tubal ligation    . Hernia repair      "in my stomach"  . Cataract extraction w/ intraocular lens  implant, bilateral    . Cardioversion  07/04/2012    Procedure: CARDIOVERSION;  Surgeon: Sueanne Margarita, MD;  Location: [facility];  Service: Cardiovascular;  Laterality: N/A;  h/p in file drawer   . Colonoscopy    . Colon resection    . Colonoscopy N/A 08/13/2012    Procedure: COLONOSCOPY;  Surgeon: Gatha Mayer, MD;  Location: [facility];  Service: Endoscopy;  Laterality: N/A;  . Permanent pacemaker insertion N/A 10/07/2011    Procedure: PERMANENT PACEMAKER INSERTION;  Surgeon: Deboraha Sprang, MD;  Location: [facility] CATH LAB;  Service: Cardiovascular;  Laterality: N/A;  . Left heart catheterization with coronary angiogram N/A 06/11/2014    Procedure: LEFT HEART CATHETERIZATION WITH CORONARY ANGIOGRAM;  Surgeon: Troy Sine, MD;  Location: [facility] CATH LAB;  Service: Cardiovascular;  Laterality: N/A;  . Cardiac catheterization N/A 03/27/2015    Procedure: Left Heart Cath and Coronary Angiography;  Surgeon: Leonie Man, MD;  Location: [facility] CV LAB;  Service: Cardiovascular;  Laterality: N/A;   Social History:  reports that she quit smoking about 40 years ago. Her smoking use included Cigarettes. She quit smokeless tobacco use about 39 years ago. Her smokeless tobacco use included Snuff. She reports that she does not drink alcohol or use illicit drugs.  Allergies  Allergen Reactions  . Cymbalta [Duloxetine Hcl]     Diarrhea, weakness   Family History  Problem Relation Age of Onset  . CVA Mother   . Hypertension Mother   . CVA Brother   . Hypertension Brother     Prior to Admission medications   Medication Sig Start Date End Date Taking? Authorizing Provider  acetaminophen (TYLENOL) 500 MG tablet Take 500 mg by mouth every 6 (six) hours as needed. For pain    Historical Provider, MD  albuterol (PROVENTIL  HFA;VENTOLIN HFA) 108 (90 BASE) MCG/ACT inhaler Inhale 2 puffs into the lungs every 4 (four) hours as needed for wheezing or shortness of breath. For shortness of breath    Historical Provider, MD  budesonide (PULMICORT) 180 MCG/ACT inhaler Inhale 2 puffs into the lungs as needed (SOB/Wheezing).     Historical Provider, MD  Calcium Carbonate-Vitamin D (CALTRATE 600+D) 600-400 MG-UNIT per tablet Take 1 tablet by mouth daily.    Historical Provider, MD  diltiazem (CARDIZEM CD) 120 MG 24 hr capsule Take 1 capsule (120 mg total) by mouth daily. 06/26/14   Charlynne Cousins, MD  dorzolamide-timolol (COSOPT) 22.3-6.8 MG/ML ophthalmic solution Place 1 drop into both eyes 2 (two) times daily.  07/19/14   Historical Provider, MD  ferrous sulfate 325 (65 FE) MG tablet Take 1 tablet (325 mg total) by mouth daily with breakfast. 10/20/14   Delfina Redwood, MD  fluticasone (FLONASE) 50 MCG/ACT nasal spray Place into both nostrils as needed for allergies or rhinitis.    Historical Provider, MD  hydrocortisone cream 1 % Apply 1 application topically daily.    Historical Provider, MD  lisinopril (PRINIVIL,ZESTRIL) 10 MG tablet Take 1 tablet (10 mg total) by mouth daily. 11/21/14   Deboraha Sprang, MD  loratadine (CLARITIN) 10 MG tablet Take 10 mg by mouth daily.    Historical Provider, MD  LORazepam (ATIVAN) 1 MG tablet Take 1 mg by mouth at bedtime.     Historical Provider, MD  lovastatin (MEVACOR) 20 MG tablet Take 20 mg by mouth at bedtime.    Historical Provider, MD  memantine (NAMENDA) 10 MG tablet Take 10 mg by mouth 2 (two) times daily.    Historical Provider, MD  metoprolol succinate (TOPROL-XL) 50 MG 24 hr tablet Take 1 tablet (50 mg total) by mouth 2 (two) times daily. Take with or immediately following a meal. 11/21/14   Deboraha Sprang, MD  Multiple Vitamins-Minerals (ALIVE WOMENS 50+ PO) Take by mouth daily.    Historical Provider, MD  nitroGLYCERIN (NITROSTAT) 0.4 MG SL tablet Place 1 tablet (0.4 mg  total) under the tongue every 5 (five) minutes as needed for chest pain. 06/14/14   Bobby Rumpf [place], PA-C  pantoprazole (PROTONIX) 40 MG tablet Take 1 tablet (40 mg total) by mouth 2 (two) times daily before a meal. 06/26/14   Charlynne Cousins, MD  potassium chloride (K-DUR) 10 MEQ tablet  09/23/14   Historical Provider, MD  potassium chloride (K-DUR,KLOR-CON) 10 MEQ tablet Take 1 tablet (10 mEq total) by mouth daily. 06/26/14   Charlynne Cousins, MD  Probiotic Product (ALIGN PO) Take 1 tablet by mouth daily.    Historical Provider, MD  Simethicone (GAS-X EXTRA STRENGTH) 125 MG CAPS Take 125 mg by mouth as needed (gas).     Historical Provider, MD  vitamin E 400 UNIT capsule Take 400 Units by mouth daily.    Historical Provider, MD    Physical Exam: Blood pressure 138/64, pulse 96, temperature 98.4 F (36.9 C), temperature source Oral, resp. rate 22, height 5\' 8"  (1.727 m), weight 72.4 kg (159 lb 9.8 oz), SpO2 97 %. @VITALS2 @ [employer]   03/27/15 2000 03/28/15 0200  Weight: 71.8 kg (158 lb 4.6 oz) 72.4 kg (159 lb 9.8 oz)    Intake/Output Summary (Last 24 hours) at 03/28/15 4665 Last data filed at 03/28/15 0200  Gross per 24 hour  Intake   1085 ml  Output    550 ml  Net    535 ml     Constitutional: Appears well-developed and well-nourished. Lying in bed in NAD HENT: Normocephalic.Oropharynx is clear and moist.  Eyes: Conjunctivae and EOM are normal., no scleral icterus.  Neck: Normal ROM. Neck supple. No JVD. No tracheal deviation. No thyromegaly.  CVS: irregularly irregular, no m/r/g Pulmonary: Effort and breath sounds normal, no stridor, rhonchi, wheezes, rales.  Abdominal: Soft. Bowel sounds present, but decreased. Mild tenderness on palpation, but no r/g Musculoskeletal: Normal range of motion. No edema and no tenderness.  Neuro: Alert. Normal reflexes, muscle tone coordination. No cranial nerve deficit. Skin: Skin is warm and dry. No rash noted. Not diaphoretic. No  erythema. No pallor.  Psychiatric: Normal mood and affect. Behavior, judgment, thought content normal.    Labs on Admission:  Basic Metabolic Panel:  Recent Labs Lab 03/27/15 1825 03/27/15 1858 03/28/15 0320  NA 138 140 139  K 4.5 3.8 4.3  CL 102 102 104  CO2 27  --  26  GLUCOSE 122* 129* 127*  BUN 7 9 6  CREATININE 1.09* 0.90 0.91  CALCIUM 9.7  --  8.9   Liver Function Tests:  Recent Labs Lab 03/27/15 1825  AST 42*  ALT 30  ALKPHOS 54  BILITOT 0.9  PROT 7.5  ALBUMIN 4.0    Recent Labs Lab 03/27/15 1825  LIPASE 186*   No results for input(s): AMMONIA in the last 168 hours. CBC:  Recent Labs Lab 03/27/15 1825 03/27/15 1858 03/28/15 0320  WBC 14.9*  --  11.5*  NEUTROABS 12.5*  --   --   HGB 13.5 13.6 12.4  HCT 41.4 40.0 38.2  MCV 91.4  --  90.7  PLT 211  --  189   Cardiac Enzymes:  Recent Labs Lab 03/27/15 1840  CKTOTAL 52  CKMB 2.6  TROPONINI <0.03   BNP: Invalid input(s): POCBNP CBG: No results for input(s): GLUCAP in the last 168 hours.  Radiological Exams on Admission: Dg Chest Port 1 View  03/27/2015  CLINICAL DATA:  Acute onset of generalized chest pain. Initial encounter. EXAM: PORTABLE CHEST 1 VIEW COMPARISON:  Chest radiograph performed 06/22/2014 FINDINGS: The lungs are well-aerated. Minimal left basilar atelectasis is noted. There is no evidence of pleural effusion or pneumothorax. The cardiomediastinal silhouette is borderline enlarged. The patient is status post median sternotomy. A pacemaker is noted overlying the left chest wall, with leads ending overlying the right atrium and right ventricle. No acute osseous abnormalities are seen. IMPRESSION: Minimal left basilar atelectasis noted.  Borderline cardiomegaly. Electronically Signed   By: Garald Balding M.D.   On: 03/27/2015 22:32   A/P:   1. Abdominal/epigastric pain: -rule out constipation vs. Sbo. Check abdominal film -mild WBC elevation 11.5 <--14.9. Pt afebrile and  nontoxic appearing. Will monitor trend. Consider CT abdomen pending xray results.  -lipase 186. Await xray findings. May need CT. Monitor trend -supportive care  2. Abnormal EKG -s/p cath 03/27/15. Coronaries wildely patent with preserved EF.  -No further w/u planned  3. Hypertension, uncontrolled -improved now. Will continue to monitor -Home meds held on admit. Currently on IV BB with prn meds ordered -Await xray findings to resume home meds.   4. CKD, stage III -stable. Baseline appears to be ~1-1.2  5. Chronic afib -rate controlled on IV BB -no anticoag with hx GIB  6. DVT proph -SCDs  Await radiology findings. Pt can likely transfer to medical bed later today.   Time spent: 45 min  Patrici Ranks, NP-C [place]  pgr [phone]   I have examined the patient, reviewed the chart and discussed the case with Patrici Ranks, NP. I agree with the assessment and plan as outlined above.  Mild diffuse abd pain and tenderness on exam. Abd not distended, BS+ but states she is not passing gas. Last BM yesterday was a small amount of hard stool. Lipase mildly elevated yesterday . She complains of nausea but no vomiting. Xray abd suggestive of partial SBO. Will obtain CT abd/ pelvis to further evaluate. Amylase/ Lipase pending. Add on LFTs.  Cont Metoprolol IV for now for rate control. Can decide on resumption of oral medications once CT report reviewed.   Debbe Odea, MD

## 2015-03-28 NOTE — Progress Notes (Signed)
CT abd/pelvis with contrast resulted and showed SBO likely secondary to adhesions (hx abd surgery in past). Surgeon on call, Dr. Rosendo Gros, called and pt was discussed. He will see pt in consult tonight.  Clance Boll, NP [facility]

## 2015-03-28 NOTE — Progress Notes (Signed)
Initial Nutrition Assessment  DOCUMENTATION CODES:   Not applicable  INTERVENTION:   -RD will follow for diet advancement and supplement as appropriate  NUTRITION DIAGNOSIS:   Inadequate oral intake related to altered GI function as evidenced by NPO status.  GOAL:   Patient will meet greater than or equal to 90% of their needs  MONITOR:   PO intake, Supplement acceptance, Diet advancement, Labs, Weight trends, Skin, I & O's  REASON FOR ASSESSMENT:   Malnutrition Screening Tool    ASSESSMENT:   81 y/o female with a h/o HCM, MVR, persistent AF/Fl, and c/p s/p nonobs cath in 06/2014, who presented to the ED today w/ complaints of c/p and was noted to have inferior ST elevation.  Pt admitted with elevated ST elevation.  Hx obtained by pt at bedside. She confirms decline in appetite and weight loss over the past month. She reports that her appetite is fair at baseline, however, has been declining PTA. Typical intake is 2-3 meals per day (Breakfast: bowl of honey bunches of oats and green tea, Lunch: chicken, green beans, potatoes, Dinner: yogurt). Beverages consist of milk, water, and cranberry juice. She also reports she drinks one chocolate Ensure.   Pt estimates that she has lost 5# within the past month. She reveals UBW around 157#, however, wt hx in chart reveals UBW around 165-170#. Wt changes are not significant for time frame given.   Nutrition-Focused physical exam completed. Findings are no fat depletion, mild muscle depletion, and no edema.   Pt understanding of purpose of NPO order. Discussed importance of good PO intake once diet is advanced to promote healing. Pt requests to continue Ensure supplementation once diet is advanced.   Per RN, awaiting oral contrast in preparation for CT of abdomen.   Labs reviewed.   Diet Order:  Diet NPO time specified  Skin:  Reviewed, no issues  Last BM:  03/27/15  Height:   Ht Readings from Last 1 Encounters:  03/28/15 5'  8" (1.727 m)    Weight:   Wt Readings from Last 1 Encounters:  03/28/15 159 lb 9.8 oz (72.4 kg)    Ideal Body Weight:  63.6 kg  BMI:  Body mass index is 24.27 kg/(m^2).  Estimated Nutritional Needs:   Kcal:  1550-1750  Protein:  75-85 grams  Fluid:  1.5-1.7 L  EDUCATION NEEDS:   Education needs addressed  Tamica Covell A. Jimmye Norman, RD, LDN, CDE Pager: [phone] After hours Pager: [phone]

## 2015-03-28 NOTE — Progress Notes (Signed)
Patient Name: Jasmine [name] Date of Encounter: 03/28/2015  Principal Problem:   Epigastric pain Active Problems:   Essential hypertension   Abnormal finding on EKG   Primary Cardiologist: Dr Radford Pax, Dr Caryl Comes  Patient Profile: 81 y/o female with a h/o HCM, MVR, persistent AF/Fl, and c/p s/p nonobs cath in 06/2014 was admitted 10/20 w/ inf STEMI, nl cors/EF at cath  SUBJECTIVE: The abdominal pain is coming back again, all over her belly, she is tender. Normal BM 2 days ago, a little constipated yesterday. Nauseated, has had procedure so that she cannot vomit.  OBJECTIVE Filed Vitals:   03/28/15 0200 03/28/15 0400 03/28/15 0415 03/28/15 0428  BP: 164/75 172/72 159/97   Pulse: 73 76 82 82  Temp:  98.6 F (37 C)    TempSrc:  Oral    Resp: 20 15    Height: 5\' 8"  (1.727 m)     Weight: 159 lb 9.8 oz (72.4 kg)     SpO2: 95% 98%      Intake/Output Summary (Last 24 hours) at 03/28/15 4010 Last data filed at 03/28/15 0200  Gross per 24 hour  Intake   1085 ml  Output    550 ml  Net    535 ml   Filed Weights   03/27/15 2000 03/28/15 0200  Weight: 158 lb 4.6 oz (71.8 kg) 159 lb 9.8 oz (72.4 kg)    PHYSICAL EXAM General: Well developed, well nourished, female in no acute distress. Head: Normocephalic, atraumatic.  Neck: Supple without bruits, JVD not elevated. Lungs:  Resp regular and unlabored, CTA. Heart: RRR, S1, S2, no S3, S4, or murmur; no rub. Abdomen: Soft, diffusely tender, slightly distended, minimal BS.  Extremities: No clubbing, cyanosis, edema.  Neuro: Alert and oriented X 3. Moves all extremities spontaneously. Psych: Normal affect.  LABS: CBC:  Recent Labs  03/27/15 1825 03/27/15 1858 03/28/15 0320  WBC 14.9*  --  11.5*  NEUTROABS 12.5*  --   --   HGB 13.5 13.6 12.4  HCT 41.4 40.0 38.2  MCV 91.4  --  90.7  PLT 211  --  189   INR:  Recent Labs  03/27/15 1840  INR 2.72   Basic Metabolic Panel:  Recent Labs  03/27/15 1825  03/27/15 1858 03/28/15 0320  NA 138 140 139  K 4.5 3.8 4.3  CL 102 102 104  CO2 27  --  26  GLUCOSE 122* 129* 127*  BUN 7 9 6   CREATININE 1.09* 0.90 0.91  CALCIUM 9.7  --  8.9   Liver Function Tests:  Recent Labs  03/27/15 1825  AST 42*  ALT 30  ALKPHOS 54  BILITOT 0.9  PROT 7.5  ALBUMIN 4.0   Cardiac Enzymes:  Recent Labs  03/27/15 1840  CKTOTAL 52  CKMB 2.6  TROPONINI <0.03    Recent Labs  03/27/15 1830  TROPIPOC 0.03   Hemoglobin A1C:  Recent Labs  03/27/15 1840  HGBA1C 5.6   Fasting Lipid Panel:  Recent Labs  03/27/15 1840  CHOL 134  HDL 51  LDLCALC 75  TRIG 38  CHOLHDL 2.6   TELE:  Atrial fib +/- V pacing      CATH:  03/27/2015 1. Angiographically normal CAD with very tortuous vessels. 2. The left ventricular systolic function is normal. 3. Normal LVEDP Despite having a very abnormal looking EKG, the patient's coronary arteries are widely patent. Nothing to explain EKG changes from a angiographic standpoint. Preserved EF argues against  CHF. With her presentation of abdominal pain, we will consult [facility] for assistance. Recommendation:  She will be admitted to the stepdown unit into H for monitoring post cath.   We will continue most of her home medications.  Internal medicine is consulted to help with evaluation treatment of the epigastric abdominal pain. Is a history of diverticulosis/diverticulitis. Cardiology will continue to help out with management of her atrial fibrillation. She is not on any anticoagulation due to history of GI bleeds in the past.  Radiology/Studies: Dg Chest Port 1 View  03/27/2015  CLINICAL DATA:  Acute onset of generalized chest pain. Initial encounter. EXAM: PORTABLE CHEST 1 VIEW COMPARISON:  Chest radiograph performed 06/22/2014 FINDINGS: The lungs are well-aerated. Minimal left basilar atelectasis is noted. There is no evidence of pleural effusion or pneumothorax. The cardiomediastinal  silhouette is borderline enlarged. The patient is status post median sternotomy. A pacemaker is noted overlying the left chest wall, with leads ending overlying the right atrium and right ventricle. No acute osseous abnormalities are seen. IMPRESSION: Minimal left basilar atelectasis noted.  Borderline cardiomegaly. Electronically Signed   By: Garald Balding M.D.   On: 03/27/2015 22:32   Current Medications:  . budesonide  0.25 mg Nebulization BID  . calcium-vitamin D  1 tablet Oral Daily  . diltiazem  120 mg Oral Q24H  . dorzolamide-timolol  1 drop Both Eyes BID  . ferrous sulfate  325 mg Oral Q breakfast  . lisinopril  10 mg Oral Daily  . loratadine  10 mg Oral Daily  . LORazepam  1 mg Oral QHS  . memantine  10 mg Oral BID  . metoprolol succinate  50 mg Oral BID  . pantoprazole  40 mg Oral BID AC  . potassium chloride  10 mEq Oral Daily  . sodium chloride  3 mL Intravenous Q12H  . vitamin E  400 Units Oral Daily      ASSESSMENT AND PLAN: Principal Problem:   Epigastric pain - discuss with MD if we should get KUB or CT prior to calling IM/GI consult - PRN rx for now - keep NPO - have ordered abd CT, will also ck amylase and lipase  Active Problems:   Essential hypertension - BP is elevated  - will give IV BB and hydralazine for BP control    Abnormal finding on EKG - ECG has LBBB at baseline, ECG from yesterday was very abnormal - with normal cors and normal EF, no CHF, no further cardiac workup indicated.  MD advise on calling IM consult to assume care.   Signed, Rosaria Ferries , PA-C 7:22 AM 03/28/2015 History as above. Patient states these GI symptoms are similar to prior episodes. She has a history of diverticulosis. Lipase is elevated. On exam she is very tender throughout her abdomen. Bowel sounds are depressed. Multiple abdominal scars.  She is in chronic atrial fibrillation and not on anticoagulation so systemic embolus to gut would be a consideration. Will get  CT scan of abdomen and ask IM to see and assume care.

## 2015-03-28 NOTE — Consult Note (Signed)
Reason for Consult: Small bowel obstruction Referring Physician: Dr. Debroah Baller Jasmine [name] is an 81 y.o. female.  HPI: Patient is an 81 year old female who arrived one day ago with epigastric abdominal pain. Secondary to patient's previous cardiac history and elevated ST segments of EKG she underwent cardiac catheterization. Patient had negative catheterization workup.  Patient continued with abdominal pain. She underwent x-ray which revealed possible bowel obstruction assessment of this patient underwent CT scan which revealed widely bowel obstruction due to adhesions.  Patient states she's had a history of sigmoid colectomy secondary to diverticulitis multiple years ago,? Hernia surgery, open cholecystectomy via paramedian incision.  Patient had a G-tube placed and surgery was consult for further management and care.  Past Medical History  Diagnosis Date  . GERD (gastroesophageal reflux disease)   . Insomnia   . Eczema   . Glaucoma   . HOCM (hypertrophic obstructive cardiomyopathy) ([place])     a. s/p septal myomectomy;  b. 06/2014 Echo: EF 50-55%, mild antsept HK, mild MR, mild MS/MR (bioprosth valve), mildly dil LA, mod TR, PASP 81mHg.  .Marland KitchenHyperlipidemia   . Non-obstructive coronary artery disease     a. 06/2014 NSTEMI/Cath: LM nl, LAD nl, LCX nl, RCA 10-240mEF 45%.  . Blood transfusion   . Chronic kidney disease (CKD), stage III (moderate)   . History of Severe Mitral Regurgitation     a. s/p MVR with pericardial tissue valve;  b. 06/2014 Mild Mitral Stenosis by echo.  . Diverticulosis of colon with hemorrhage 08/12/2012  . History of GI Bleed     a. prevents use of OALynch . Marland Kitchensthma   . Essential hypertension   . Reflux esophagitis   . Pulmonary HTN (HCObion    a. mild to moderate (PASP 3270m by echo 06/2014).  . AMarland Kitchenrtic insufficiency     a. 06/2014 Echo: Mild AI.  . BMarland Kitchenadycardia     a. 10/2011 s/p SJM Accent DR DC PPM, ser #: [id number]. Atrial fibrillation and flutter     a. atrial  fibrillation s/p DCCV 06-2012;  b. now in chronic atrial fibrillation off anticoagulation due to GI bleed.  . Paroxysmal atrial tachycardia [place] . PVCs (premature ventricular contractions)   . Dementia   . Atypical chest pain     Past Surgical History  Procedure Laterality Date  . Cardiac catheterization    . Cardiac valve replacement  2007    MVR  . Myomectomy  2007    septal  . Tonsillectomy    . Appendectomy    . Cholecystectomy    . Tubal ligation    . Hernia repair      "in my stomach"  . Cataract extraction w/ intraocular lens  implant, bilateral    . Cardioversion  07/04/2012    Procedure: CARDIOVERSION;  Surgeon: TraSueanne MargaritaD;  [facility];  Laterality: N/A;  h/p in file drawer   . Colonoscopy    . Colon resection    . Colonoscopy N/A 08/13/2012    Procedure: COLONOSCOPY;  Surgeon: CarGatha MayerD;  [facility];  Laterality: N/A;  . Permanent pacemaker insertion N/A 10/07/2011    Procedure: PERMANENT PACEMAKER INSERTION;  Surgeon: SteDeboraha SprangD;  [facility];  Service: Cardiovascular;  Laterality: N/A;  . Left heart catheterization with coronary angiogram N/A 06/11/2014    Procedure: LEFT HEART CATHETERIZATION WITH CORONARY ANGIOGRAM;  Surgeon: ThoTroy Sine  MD;  Location: [facility] C[facility];  Service: Cardiovascular;  Laterality: N/A;  . Cardiac catheterization N/A 03/27/2015    Procedure: Left Heart Cath and Coronary Angiography;  Surgeon: Leonie Man, MD;  Location: [place] LAB;  Service: Cardiovascular;  Laterality: N/A;    Family History  Problem Relation Age of Onset  . CVA Mother   . Hypertension Mother   . CVA Brother   . Hypertension Brother     Social History:  reports that she quit smoking about 40 years ago. Her smoking use included Cigarettes. She quit smokeless tobacco use about 39 years ago. Her smokeless tobacco use included Snuff. She reports that she does  not drink alcohol or use illicit drugs.  Allergies:  Allergies  Allergen Reactions  . Cymbalta [Duloxetine Hcl]     Diarrhea, weakness    Medications: I have reviewed the patient's current medications.  Results for orders placed or performed during the hospital encounter of 03/27/15 (from the past 48 hour(s))  Lipase, blood     Status: Abnormal   Collection Time: 03/27/15  6:25 PM  Result Value Ref Range   Lipase 186 (H) 11 - 51 U/L    Comment: Please note change in reference range.  Comprehensive metabolic panel     Status: Abnormal   Collection Time: 03/27/15  6:25 PM  Result Value Ref Range   Sodium 138 135 - 145 mmol/L   Potassium 4.5 3.5 - 5.1 mmol/L   Chloride 102 101 - 111 mmol/L   CO2 27 22 - 32 mmol/L   Glucose, Bld 122 (H) 65 - 99 mg/dL   BUN 7 6 - 20 mg/dL   Creatinine, Ser 1.09 (H) 0.44 - 1.00 mg/dL   Calcium 9.7 8.9 - 10.3 mg/dL   Total Protein 7.5 6.5 - 8.1 g/dL   Albumin 4.0 3.5 - 5.0 g/dL   AST 42 (H) 15 - 41 U/L   ALT 30 14 - 54 U/L   Alkaline Phosphatase 54 38 - 126 U/L   Total Bilirubin 0.9 0.3 - 1.2 mg/dL   GFR calc non Af Amer 44 (L) >60 mL/min   GFR calc Af Amer 51 (L) >60 mL/min    Comment: (NOTE) The eGFR has been calculated using the CKD EPI equation. This calculation has not been validated in all clinical situations. eGFR's persistently <60 mL/min signify possible Chronic Kidney Disease.    Anion gap 9 5 - 15  CBC     Status: Abnormal   Collection Time: 03/27/15  6:25 PM  Result Value Ref Range   WBC 14.9 (H) 4.0 - 10.5 K/uL   RBC 4.53 3.87 - 5.11 MIL/uL   Hemoglobin 13.5 12.0 - 15.0 g/dL   HCT 41.4 36.0 - 46.0 %   MCV 91.4 78.0 - 100.0 fL   MCH 29.8 26.0 - 34.0 pg   MCHC 32.6 30.0 - 36.0 g/dL   RDW 14.7 11.5 - 15.5 %   Platelets 211 150 - 400 K/uL  Differential     Status: Abnormal   Collection Time: 03/27/15  6:25 PM  Result Value Ref Range   Neutrophils Relative % 84 %   Neutro Abs 12.5 (H) 1.7 - 7.7 K/uL   Lymphocytes Relative  11 %   Lymphs Abs 1.7 0.7 - 4.0 K/uL   Monocytes Relative 4 %   Monocytes Absolute 0.6 0.1 - 1.0 K/uL   Eosinophils Relative 1 %   Eosinophils Absolute 0.1 0.0 - 0.7 K/uL  Basophils Relative 0 %   Basophils Absolute 0.0 0.0 - 0.1 K/uL  POCT i-Stat troponin I     Status: None   Collection Time: 03/27/15  6:30 PM  Result Value Ref Range   Troponin i, poc 0.03 0.00 - 0.08 ng/mL   Comment 3            Comment: Due to the release kinetics of cTnI, a negative result within the first hours of the onset of symptoms does not rule out myocardial infarction with certainty. If myocardial infarction is still suspected, repeat the test at appropriate intervals.   CK total and CKMB (cardiac)not at [facility]     Status: None   Collection Time: 03/27/15  6:40 PM  Result Value Ref Range   Total CK 52 38 - 234 U/L   CK, MB 2.6 0.5 - 5.0 ng/mL   Relative Index RELATIVE INDEX IS INVALID 0.0 - 2.5    Comment: WHEN CK < 100 U/L          Hemoglobin A1c     Status: None   Collection Time: 03/27/15  6:40 PM  Result Value Ref Range   Hgb A1c MFr Bld 5.6 4.8 - 5.6 %    Comment: (NOTE)         Pre-diabetes: 5.7 - 6.4         Diabetes: >6.4         Glycemic control for adults with diabetes: <7.0    Mean Plasma Glucose 114 mg/dL    Comment: (NOTE) Performed At: [facility] [place], [place] [id number] Lindon Romp MD NF:[id number]   Lipid panel     Status: None   Collection Time: 03/27/15  6:40 PM  Result Value Ref Range   Cholesterol 134 0 - 200 mg/dL   Triglycerides 38 <150 mg/dL   HDL 51 >40 mg/dL   Total CHOL/HDL Ratio 2.6 RATIO   VLDL 8 0 - 40 mg/dL   LDL Cholesterol 75 0 - 99 mg/dL    Comment:        Total Cholesterol/HDL:CHD Risk Coronary Heart Disease Risk Table                     Men   Women  1/2 Average Risk   3.4   3.3  Average Risk       5.0   4.4  2 X Average Risk   9.6   7.1  3 X Average Risk  23.4   11.0        Use the calculated Patient  Ratio above and the CHD Risk Table to determine the patient's CHD Risk.        ATP III CLASSIFICATION (LDL):  <100     mg/dL   Optimal  100-129  mg/dL   Near or Above                    Optimal  130-159  mg/dL   Borderline  160-189  mg/dL   High  >190     mg/dL   Very High   Protime-INR     Status: Abnormal   Collection Time: 03/27/15  6:40 PM  Result Value Ref Range   Prothrombin Time 16.1 (H) 11.6 - 15.2 seconds   INR 1.28 0.00 - 1.49  APTT     Status: Abnormal   Collection Time: 03/27/15  6:40 PM  Result Value Ref Range   aPTT >200 (HH) 24 -  37 seconds    Comment:        IF BASELINE aPTT IS ELEVATED, SUGGEST PATIENT RISK ASSESSMENT BE USED TO DETERMINE APPROPRIATE ANTICOAGULANT THERAPY. REPEATED TO VERIFY CRITICAL RESULT CALLED TO, READ BACK BY AND VERIFIED WITH: P TRIVETT,RN 2027 03/27/15 D BRADLEY   Troponin I     Status: None   Collection Time: 03/27/15  6:40 PM  Result Value Ref Range   Troponin I <0.03 <0.031 ng/mL    Comment:        NO INDICATION OF MYOCARDIAL INJURY.   I-STAT, chem 8     Status: Abnormal   Collection Time: 03/27/15  6:58 PM  Result Value Ref Range   Sodium 140 135 - 145 mmol/L   Potassium 3.8 3.5 - 5.1 mmol/L   Chloride 102 101 - 111 mmol/L   BUN 9 6 - 20 mg/dL   Creatinine, Ser 0.90 0.44 - 1.00 mg/dL   Glucose, Bld 129 (H) 65 - 99 mg/dL   Calcium, Ion 1.25 1.13 - 1.30 mmol/L   TCO2 25 0 - 100 mmol/L   Hemoglobin 13.6 12.0 - 15.0 g/dL   HCT 40.0 36.0 - 46.0 %  POCT Activated clotting time     Status: None   Collection Time: 03/27/15  7:06 PM  Result Value Ref Range   Activated Clotting Time 196 seconds  MRSA PCR Screening     Status: None   Collection Time: 03/27/15  8:00 PM  Result Value Ref Range   MRSA by PCR NEGATIVE NEGATIVE    Comment:        The GeneXpert MRSA Assay (FDA approved for NASAL specimens only), is one component of a comprehensive MRSA colonization surveillance program. It is not intended to diagnose  MRSA infection nor to guide or monitor treatment for MRSA infections.   Urinalysis, Routine w reflex microscopic (not at [facility])     Status: Abnormal   Collection Time: 03/27/15  8:58 PM  Result Value Ref Range   Color, Urine YELLOW YELLOW   APPearance CLEAR CLEAR   Specific Gravity, Urine 1.020 1.005 - 1.030   pH 7.5 5.0 - 8.0   Glucose, UA NEGATIVE NEGATIVE mg/dL   Hgb urine dipstick NEGATIVE NEGATIVE   Bilirubin Urine NEGATIVE NEGATIVE   Ketones, ur NEGATIVE NEGATIVE mg/dL   Protein, ur NEGATIVE NEGATIVE mg/dL   Urobilinogen, UA 1.0 0.0 - 1.0 mg/dL   Nitrite NEGATIVE NEGATIVE   Leukocytes, UA MODERATE (A) NEGATIVE  Urine microscopic-add on     Status: Abnormal   Collection Time: 03/27/15  8:58 PM  Result Value Ref Range   Squamous Epithelial / LPF FEW (A) RARE   WBC, UA 7-10 <3 WBC/hpf   RBC / HPF 0-2 <3 RBC/hpf   Bacteria, UA FEW (A) RARE   Casts HYALINE CASTS (A) NEGATIVE   Urine-Other MUCOUS PRESENT   CBC     Status: Abnormal   Collection Time: 03/28/15  3:20 AM  Result Value Ref Range   WBC 11.5 (H) 4.0 - 10.5 K/uL   RBC 4.21 3.87 - 5.11 MIL/uL   Hemoglobin 12.4 12.0 - 15.0 g/dL   HCT 38.2 36.0 - 46.0 %   MCV 90.7 78.0 - 100.0 fL   MCH 29.5 26.0 - 34.0 pg   MCHC 32.5 30.0 - 36.0 g/dL   RDW 14.6 11.5 - 15.5 %   Platelets 189 150 - 400 K/uL  Basic metabolic panel     Status: Abnormal   Collection Time: 03/28/15  3:20 AM  Result Value Ref Range   Sodium 139 135 - 145 mmol/L   Potassium 4.3 3.5 - 5.1 mmol/L   Chloride 104 101 - 111 mmol/L   CO2 26 22 - 32 mmol/L   Glucose, Bld 127 (H) 65 - 99 mg/dL   BUN 6 6 - 20 mg/dL   Creatinine, Ser 0.91 0.44 - 1.00 mg/dL   Calcium 8.9 8.9 - 10.3 mg/dL   GFR calc non Af Amer 55 (L) >60 mL/min   GFR calc Af Amer >60 >60 mL/min    Comment: (NOTE) The eGFR has been calculated using the CKD EPI equation. This calculation has not been validated in all clinical situations. eGFR's persistently <60 mL/min signify possible Chronic  Kidney Disease.    Anion gap 9 5 - 15  Lipase, blood     Status: Abnormal   Collection Time: 03/28/15 10:12 AM  Result Value Ref Range   Lipase 104 (H) 11 - 51 U/L    Comment: Please note change in reference range.  Amylase     Status: Abnormal   Collection Time: 03/28/15 10:12 AM  Result Value Ref Range   Amylase 134 (H) 28 - 100 U/L  Hepatic function panel     Status: Abnormal   Collection Time: 03/28/15 10:12 AM  Result Value Ref Range   Total Protein 6.1 (L) 6.5 - 8.1 g/dL   Albumin 3.3 (L) 3.5 - 5.0 g/dL   AST 40 15 - 41 U/L   ALT 27 14 - 54 U/L   Alkaline Phosphatase 47 38 - 126 U/L   Total Bilirubin 0.9 0.3 - 1.2 mg/dL   Bilirubin, Direct 0.2 0.1 - 0.5 mg/dL   Indirect Bilirubin 0.7 0.3 - 0.9 mg/dL    Ct Abdomen Pelvis W Contrast  03/28/2015  CLINICAL DATA:  Generalized abdominal pain radiating to the epigastric area. Nausea. EXAM: CT ABDOMEN AND PELVIS WITH CONTRAST TECHNIQUE: Multidetector CT imaging of the abdomen and pelvis was performed using the standard protocol following bolus administration of intravenous contrast. CONTRAST:  134m OMNIPAQUE IOHEXOL 300 MG/ML  SOLN COMPARISON:  CT 07/08/2012 FINDINGS: Lower chest: Small bilateral pleural effusions. Associated atelectasis the left lung base. There is oral contrast in the distal esophagus. Liver: No focal lesion. Hepatobiliary: Postcholecystectomy with stable intra and extrahepatic biliary prominence. No calcified choledocholithiasis. Pancreas: Generalized atrophy.  No surrounding inflammation. Spleen: No focal lesion.  Small in size. Adrenal glands: No nodule. Kidneys: Symmetric renal enhancement and excretion. No hydronephrosis. Two cysts in the right kidney are unchanged. Stomach/Bowel: Enteric tube in place, tip in the stomach. The stomach is distended with ingested contrast. Duodenum diverticulum is again seen without inflammation. Proximal small bowel loops are fluid-filled and distended with a transition point in the  mid lower abdomen, axial images 61-64. Small bowel loops distal to this are decompressed. Small amount of adjacent free fluid and mesenteric edema. No pneumatosis. There is diverticulosis throughout the colon without diverticulitis. Enteric sutures noted in the sigmoid colon. No colonic inflammation. The appendix is not seen. Vascular/Lymphatic: No retroperitoneal adenopathy. Abdominal aorta is normal in caliber. Atherosclerosis of the abdominal aorta without aneurysm. Reproductive: The uterus is atrophic, normal for age ovaries are not visualized. There is no adnexal mass. Bladder: Distended with excreted intravenous contrast. Other: No free air. No intra-abdominal fluid collection. Scarring in the left lower quadrant abdominal wall, likely prior colostomy site. Musculoskeletal: There are no acute or suspicious osseous abnormalities. Multilevel degenerative change throughout the lumbar spine. IMPRESSION: 1.  Small bowel obstruction with transition point in the mid lower abdomen, likely due to adhesions. 2. Chronic findings include diverticulosis without diverticulitis, duodenum diverticulum, postcholecystectomy with stable biliary prominence, and right renal cysts. These results will be called to the ordering clinician or representative by the Radiologist Assistant, and communication documented in the PACS or zVision Dashboard. Electronically Signed   By: Jeb Levering M.D.   On: 03/28/2015 18:35   Dg Chest Port 1 View  03/27/2015  CLINICAL DATA:  Acute onset of generalized chest pain. Initial encounter. EXAM: PORTABLE CHEST 1 VIEW COMPARISON:  Chest radiograph performed 06/22/2014 FINDINGS: The lungs are well-aerated. Minimal left basilar atelectasis is noted. There is no evidence of pleural effusion or pneumothorax. The cardiomediastinal silhouette is borderline enlarged. The patient is status post median sternotomy. A pacemaker is noted overlying the left chest wall, with leads ending overlying the right  atrium and right ventricle. No acute osseous abnormalities are seen. IMPRESSION: Minimal left basilar atelectasis noted.  Borderline cardiomegaly. Electronically Signed   By: Garald Balding M.D.   On: 03/27/2015 22:32   Dg Abd Portable 1v  03/28/2015  CLINICAL DATA:  NG tube placement. EXAM: PORTABLE ABDOMEN - 1 VIEW COMPARISON:  04/05/2015 at 14:02 p.m. FINDINGS: Nasogastric tube is present with tip within the stomach in the left upper quadrant and side-port just above the gastroesophageal junction. This could be advanced another 5 cm. Persistent gaseous distention of the stomach and small bowel loops in the left upper quadrant unchanged. Air and stool within the colon. Remainder the exam is unchanged. IMPRESSION: Gaseous distention of the stomach and mildly dilated air-filled small bowel loops in the left upper quadrant unchanged. Nasogastric tube with tip over the stomach and side-port just above the gastroesophageal junction. This could be advanced another 5 cm. Electronically Signed   By: Marin Olp M.D.   On: 03/28/2015 15:21   Dg Abd Portable 1v  03/28/2015  CLINICAL DATA:  NG tube placement. EXAM: PORTABLE ABDOMEN - 1 VIEW COMPARISON:  03/28/2015 at 9:36 a.m. FINDINGS: Nasogastric tube courses approximately 3 cm to the left lateral aspect of the spine over the lower thorax and turns back medially with tip just left of the spine at the level of the diaphragm. This may be within the esophagus just above the gastroesophageal junction although cannot exclude location within the left lower lobe bronchi. Persistent gaseous distention of the stomach and a small bowel loops in the left upper abdomen. Remainder of the exam is unchanged. IMPRESSION: Nasogastric tube has tip at the level of the left hemidiaphragm just above the expected region of the gastroesophageal junction as this may be within the distal esophagus, although cannot exclude bronchial location. Persistent gaseous distention of the stomach  and small bowel loops in the left upper quadrant without significant change. These results were called by telephone at the time of interpretation on 03/28/2015 at 2:17 pm to patient's nurse, Bobbe Medico, who verbally acknowledged these results. Electronically Signed   By: Marin Olp M.D.   On: 03/28/2015 14:18   Dg Abd Portable 2v  03/28/2015  CLINICAL DATA:  Abdominal pain EXAM: PORTABLE ABDOMEN - 2 VIEW COMPARISON:  08/13/2012 FINDINGS: Mild gaseous distention of the stomach and proximal small bowel with scattered small bowel air-fluid levels. Remainder of the bowel is decompressed. No free air organomegaly. Surgical clips in the left pelvis. Contrast material noted within the urinary bladder of unknown etiology. IMPRESSION: Mild gaseous distention of the stomach and proximal small bowel. Cannot exclude  proximal partial small bowel obstruction. Electronically Signed   By: Rolm Baptise M.D.   On: 03/28/2015 09:47    Review of Systems  Constitutional: Negative.   HENT: Negative.   Eyes: Negative.   Respiratory: Negative.   Cardiovascular: Negative.   Gastrointestinal: Positive for nausea, vomiting, abdominal pain and constipation. Negative for diarrhea.  Neurological: Negative.    Blood pressure 148/64, pulse 76, temperature 98.8 F (37.1 C), temperature source Oral, resp. rate 18, height _0  (1.727 m), weight 74.6 kg (164 lb 7.4 oz), SpO2 96 %. Physical Exam  Constitutional: She is oriented to person, place, and time. She appears well-developed and well-nourished.  HENT:  Head: Normocephalic and atraumatic.  Eyes: Conjunctivae and EOM are normal. Pupils are equal, round, and reactive to light.  Neck: Normal range of motion. Neck supple.  Cardiovascular: Normal rate, regular rhythm and normal heart sounds.   Respiratory: Effort normal and breath sounds normal.  GI: Soft. Bowel sounds are normal. She exhibits distension. There is no tenderness. There is no rebound and no guarding.     Musculoskeletal: Normal range of motion.  Neurological: She is alert and oriented to person, place, and time.    Assessment/Plan: 81 year old female with multiple medical issues.  1. Likely small bowel obstruction likely due to adhesive disease. At this time would recommend NG tube placement, will repeat x-ray in a.m. to evaluate transition of contrast.  2. Discussed the patient that over this could be treated nonoperatively however if the bowel resection thousand resolve she will likely require an operation should be, again secondary to previous operations.  Rosario Jacks., Staley Budzinski 03/28/2015, 9:05 PM

## 2015-03-28 NOTE — Progress Notes (Signed)
Utilization Review Completed.  

## 2015-03-29 ENCOUNTER — Inpatient Hospital Stay (HOSPITAL_COMMUNITY): Payer: Commercial Managed Care - HMO

## 2015-03-29 DIAGNOSIS — N183 Chronic kidney disease, stage 3 (moderate): Secondary | ICD-10-CM

## 2015-03-29 DIAGNOSIS — K5669 Other intestinal obstruction: Secondary | ICD-10-CM

## 2015-03-29 DIAGNOSIS — I421 Obstructive hypertrophic cardiomyopathy: Secondary | ICD-10-CM

## 2015-03-29 DIAGNOSIS — I482 Chronic atrial fibrillation: Secondary | ICD-10-CM

## 2015-03-29 DIAGNOSIS — Z950 Presence of cardiac pacemaker: Secondary | ICD-10-CM

## 2015-03-29 DIAGNOSIS — K565 Intestinal adhesions [bands], unspecified as to partial versus complete obstruction: Secondary | ICD-10-CM

## 2015-03-29 DIAGNOSIS — K56609 Unspecified intestinal obstruction, unspecified as to partial versus complete obstruction: Secondary | ICD-10-CM | POA: Insufficient documentation

## 2015-03-29 DIAGNOSIS — Z952 Presence of prosthetic heart valve: Secondary | ICD-10-CM

## 2015-03-29 LAB — CBC
HEMATOCRIT: 37 % (ref 36.0–46.0)
HEMOGLOBIN: 11.8 g/dL — AB (ref 12.0–15.0)
MCH: 29.2 pg (ref 26.0–34.0)
MCHC: 31.9 g/dL (ref 30.0–36.0)
MCV: 91.6 fL (ref 78.0–100.0)
Platelets: 189 10*3/uL (ref 150–400)
RBC: 4.04 MIL/uL (ref 3.87–5.11)
RDW: 15.1 % (ref 11.5–15.5)
WBC: 10.1 10*3/uL (ref 4.0–10.5)

## 2015-03-29 LAB — BASIC METABOLIC PANEL
Anion gap: 7 (ref 5–15)
BUN: 13 mg/dL (ref 6–20)
CHLORIDE: 101 mmol/L (ref 101–111)
CO2: 26 mmol/L (ref 22–32)
Calcium: 8 mg/dL — ABNORMAL LOW (ref 8.9–10.3)
Creatinine, Ser: 1.07 mg/dL — ABNORMAL HIGH (ref 0.44–1.00)
GFR calc Af Amer: 52 mL/min — ABNORMAL LOW (ref >60)
GFR calc non Af Amer: 45 mL/min — ABNORMAL LOW (ref >60)
Glucose, Bld: 88 mg/dL (ref 65–99)
Potassium: 3.8 mmol/L (ref 3.5–5.1)
SODIUM: 134 mmol/L — AB (ref 135–145)

## 2015-03-29 MED ORDER — DIATRIZOATE MEGLUMINE & SODIUM 66-10 % PO SOLN
90.0000 mL | Freq: Once | ORAL | Status: AC
  Administered 2015-03-29: 90 mL via NASOGASTRIC

## 2015-03-29 MED ORDER — DIATRIZOATE MEGLUMINE & SODIUM 66-10 % PO SOLN
ORAL | Status: AC
  Administered 2015-03-29: 90 mL via NASOGASTRIC
  Filled 2015-03-29: qty 90

## 2015-03-29 MED ORDER — DIATRIZOATE MEGLUMINE & SODIUM 66-10 % PO SOLN: 90.0000 mL | Freq: Once | ORAL | Status: DC

## 2015-03-29 MED ORDER — LIP MEDEX EX OINT
TOPICAL_OINTMENT | CUTANEOUS | Status: DC | PRN
  Filled 2015-03-29: qty 7

## 2015-03-29 NOTE — Progress Notes (Signed)
Patient Name: Jasmine [name] Date of Encounter: 03/29/2015  Principal Problem:   Epigastric pain Active Problems:   Pacemaker-St.Jude   Chronic atrial fibrillation (HCC)   Warfarin anticoagulation   History of mitral valve replacement with bioprosthetic valve   IHSS (idiopathic hypertrophic subaortic stenosis) (HCC)   Chronic kidney disease (CKD), stage III (moderate)   Non-obstructive coronary artery disease   Essential hypertension   Abnormal finding on EKG   Small bowel obstruction due to adhesions ([place])   Length of Stay: 2  SUBJECTIVE  Abdominal discomfort is her major complaint. CT showed SBO due to adhesions. Has NGT to suction now.  CURRENT MEDS . budesonide  0.25 mg Nebulization BID  . dorzolamide-timolol  1 drop Both Eyes BID  . enoxaparin (LOVENOX) injection  40 mg Subcutaneous Q24H  . metoprolol  5 mg Intravenous 4 times per day  . sodium chloride  3 mL Intravenous Q12H    OBJECTIVE   Intake/Output Summary (Last 24 hours) at 03/29/15 1214 Last data filed at 03/29/15 1100  Gross per 24 hour  Intake   1275 ml  Output   1300 ml  Net    -25 ml   Filed Weights   03/27/15 2000 03/28/15 0200 03/28/15 1633  Weight: 158 lb 4.6 oz (71.8 kg) 159 lb 9.8 oz (72.4 kg) 164 lb 7.4 oz (74.6 kg)    PHYSICAL EXAM Filed Vitals:   03/28/15 1935 03/28/15 2150 03/28/15 2317 03/29/15 0444  BP: 148/64  121/64 123/52  Pulse: 76 74 84 79  Temp: 98.8 F (37.1 C)   98.4 F (36.9 C)  TempSrc: Oral   Oral  Resp: 18 18  18   Height:      Weight:      SpO2: 96% 96%  97%   General: Alert, oriented x3, no distress Head: no evidence of trauma, PERRL, EOMI, no exophtalmos or lid lag, no myxedema, no xanthelasma; normal ears, nose and oropharynx Neck: normal jugular venous pulsations and no hepatojugular reflux; brisk carotid pulses without delay and no carotid bruits Chest: clear to auscultation, no signs of consolidation by percussion or palpation, normal fremitus,  symmetrical and full respiratory excursions Cardiovascular: normal position and quality of the apical impulse, irregular rhythm, normal first and second heart sounds, no rubs or gallops, no murmur Abdomen: mild tenderness especially in L flank, moderate distention, no masses by palpation, no abnormal pulsatility or arterial bruits, normal bowel sounds, no hepatosplenomegaly Extremities: no clubbing, cyanosis or edema; 2+ radial, ulnar and brachial pulses bilaterally; 2+ right femoral, posterior tibial and dorsalis pedis pulses; 2+ left femoral, posterior tibial and dorsalis pedis pulses; no subclavian or femoral bruits Neurological: grossly nonfocal  LABS  CBC  Recent Labs  03/27/15 1825  03/28/15 0320 03/29/15 0310  WBC 14.9*  --  11.5* 10.1  NEUTROABS 12.5*  --   --   --   HGB 13.5  < > 12.4 11.8*  HCT 41.4  < > 38.2 37.0  MCV 91.4  --  90.7 91.6  PLT 211  --  189 189  < > = values in this interval not displayed. Basic Metabolic Panel  Recent Labs  03/28/15 0320 03/29/15 0310  NA 139 134*  K 4.3 3.8  CL 104 101  CO2 26 26  GLUCOSE 127* 88  BUN 6 13  CREATININE 0.91 1.07*  CALCIUM 8.9 8.0*   Liver Function Tests  Recent Labs  03/27/15 1825 03/28/15 1012  AST 42* 40  ALT 30 27  ALKPHOS 54 47  BILITOT 0.9 0.9  PROT 7.5 6.1*  ALBUMIN 4.0 3.3*    Recent Labs  03/27/15 1825 03/28/15 1012  LIPASE 186* 104*  AMYLASE  --  134*   Cardiac Enzymes  Recent Labs  03/27/15 1840  CKTOTAL 52  CKMB 2.6  TROPONINI <0.03   BNP Invalid input(s): POCBNP D-Dimer No results for input(s): DDIMER in the last 72 hours. Hemoglobin A1C  Recent Labs  03/27/15 1840  HGBA1C 5.6   Fasting Lipid Panel  Recent Labs  03/27/15 1840  CHOL 134  HDL 51  LDLCALC 75  TRIG 38  CHOLHDL 2.6   Thyroid Function Tests No results for input(s): TSH, T4TOTAL, T3FREE, THYROIDAB in the last 72 hours.  Invalid input(s): [place]  Radiology Studies Imaging results have been  reviewed and Ct Abdomen Pelvis W Contrast  03/28/2015  CLINICAL DATA:  Generalized abdominal pain radiating to the epigastric area. Nausea. EXAM: CT ABDOMEN AND PELVIS WITH CONTRAST TECHNIQUE: Multidetector CT imaging of the abdomen and pelvis was performed using the standard protocol following bolus administration of intravenous contrast. CONTRAST:  1105mL OMNIPAQUE IOHEXOL 300 MG/ML  SOLN COMPARISON:  CT 07/08/2012 FINDINGS: Lower chest: Small bilateral pleural effusions. Associated atelectasis the left lung base. There is oral contrast in the distal esophagus. Liver: No focal lesion. Hepatobiliary: Postcholecystectomy with stable intra and extrahepatic biliary prominence. No calcified choledocholithiasis. Pancreas: Generalized atrophy.  No surrounding inflammation. Spleen: No focal lesion.  Small in size. Adrenal glands: No nodule. Kidneys: Symmetric renal enhancement and excretion. No hydronephrosis. Two cysts in the right kidney are unchanged. Stomach/Bowel: Enteric tube in place, tip in the stomach. The stomach is distended with ingested contrast. Duodenum diverticulum is again seen without inflammation. Proximal small bowel loops are fluid-filled and distended with a transition point in the mid lower abdomen, axial images 61-64. Small bowel loops distal to this are decompressed. Small amount of adjacent free fluid and mesenteric edema. No pneumatosis. There is diverticulosis throughout the colon without diverticulitis. Enteric sutures noted in the sigmoid colon. No colonic inflammation. The appendix is not seen. Vascular/Lymphatic: No retroperitoneal adenopathy. Abdominal aorta is normal in caliber. Atherosclerosis of the abdominal aorta without aneurysm. Reproductive: The uterus is atrophic, normal for age ovaries are not visualized. There is no adnexal mass. Bladder: Distended with excreted intravenous contrast. Other: No free air. No intra-abdominal fluid collection. Scarring in the left lower quadrant  abdominal wall, likely prior colostomy site. Musculoskeletal: There are no acute or suspicious osseous abnormalities. Multilevel degenerative change throughout the lumbar spine. IMPRESSION: 1. Small bowel obstruction with transition point in the mid lower abdomen, likely due to adhesions. 2. Chronic findings include diverticulosis without diverticulitis, duodenum diverticulum, postcholecystectomy with stable biliary prominence, and right renal cysts. These results will be called to the ordering clinician or representative by the Radiologist Assistant, and communication documented in the PACS or zVision Dashboard. Electronically Signed   By: Jeb Levering M.D.   On: 03/28/2015 18:35   Dg Chest Port 1 View  03/27/2015  CLINICAL DATA:  Acute onset of generalized chest pain. Initial encounter. EXAM: PORTABLE CHEST 1 VIEW COMPARISON:  Chest radiograph performed 06/22/2014 FINDINGS: The lungs are well-aerated. Minimal left basilar atelectasis is noted. There is no evidence of pleural effusion or pneumothorax. The cardiomediastinal silhouette is borderline enlarged. The patient is status post median sternotomy. A pacemaker is noted overlying the left chest wall, with leads ending overlying the right atrium and right ventricle. No acute osseous abnormalities are seen. IMPRESSION: Minimal  left basilar atelectasis noted.  Borderline cardiomegaly. Electronically Signed   By: Garald Balding M.D.   On: 03/27/2015 22:32   Dg Abd Portable 1v  03/29/2015  CLINICAL DATA:  Small bowel obstruction, no bowel movement for 3 days, periumbilical pain, history diverticulosis EXAM: PORTABLE ABDOMEN - 1 VIEW COMPARISON:  Portable exam 0744 hours compared to CT abdomen and pelvis 03/28/2015 FINDINGS: Tip of nasogastric tube projects over distal gastric antrum Excreted contrast material within urinary bladder. Surgical clips LEFT upper quadrant and LEFT pelvis. Persisting gas distention of small bowel loops in the LEFT upper  quadrant compatible with small bowel obstruction. No bowel wall thickening or or evidence of free intraperitoneal air on supine imaging. Degenerative disc and facet disease changes lumbar spine. IMPRESSION: Persistent dilatation of small bowel loops in the LEFT upper abdomen consistent with small bowel obstruction. Electronically Signed   By: Lavonia Dana M.D.   On: 03/29/2015 09:41   Dg Abd Portable 1v  03/28/2015  CLINICAL DATA:  NG tube placement. EXAM: PORTABLE ABDOMEN - 1 VIEW COMPARISON:  04/05/2015 at 14:02 p.m. FINDINGS: Nasogastric tube is present with tip within the stomach in the left upper quadrant and side-port just above the gastroesophageal junction. This could be advanced another 5 cm. Persistent gaseous distention of the stomach and small bowel loops in the left upper quadrant unchanged. Air and stool within the colon. Remainder the exam is unchanged. IMPRESSION: Gaseous distention of the stomach and mildly dilated air-filled small bowel loops in the left upper quadrant unchanged. Nasogastric tube with tip over the stomach and side-port just above the gastroesophageal junction. This could be advanced another 5 cm. Electronically Signed   By: Marin Olp M.D.   On: 03/28/2015 15:21   Dg Abd Portable 1v  03/28/2015  CLINICAL DATA:  NG tube placement. EXAM: PORTABLE ABDOMEN - 1 VIEW COMPARISON:  03/28/2015 at 9:36 a.m. FINDINGS: Nasogastric tube courses approximately 3 cm to the left lateral aspect of the spine over the lower thorax and turns back medially with tip just left of the spine at the level of the diaphragm. This may be within the esophagus just above the gastroesophageal junction although cannot exclude location within the left lower lobe bronchi. Persistent gaseous distention of the stomach and a small bowel loops in the left upper abdomen. Remainder of the exam is unchanged. IMPRESSION: Nasogastric tube has tip at the level of the left hemidiaphragm just above the expected region  of the gastroesophageal junction as this may be within the distal esophagus, although cannot exclude bronchial location. Persistent gaseous distention of the stomach and small bowel loops in the left upper quadrant without significant change. These results were called by telephone at the time of interpretation on 03/28/2015 at 2:17 pm to patient's nurse, Bobbe Medico, who verbally acknowledged these results. Electronically Signed   By: Marin Olp M.D.   On: 03/28/2015 14:18   Dg Abd Portable 2v  03/28/2015  CLINICAL DATA:  Abdominal pain EXAM: PORTABLE ABDOMEN - 2 VIEW COMPARISON:  08/13/2012 FINDINGS: Mild gaseous distention of the stomach and proximal small bowel with scattered small bowel air-fluid levels. Remainder of the bowel is decompressed. No free air organomegaly. Surgical clips in the left pelvis. Contrast material noted within the urinary bladder of unknown etiology. IMPRESSION: Mild gaseous distention of the stomach and proximal small bowel. Cannot exclude proximal partial small bowel obstruction. Electronically Signed   By: Rolm Baptise M.D.   On: 03/28/2015 09:47    TELE atrial fibrillation  with controlled rate and occasional V pacing   ASSESSMENT AND PLAN  1. Small bowel obstruction - likely the cause of her initial presentation. Worry about risk of rebound tachycardia while unable to take oral meds  2. Hypertrophic Cardiomyopathy s/p remote septal myectomy: Nl EF by echo in January. Euvolemic on exam. Cont home meds.  3. Essential HTN  4. S/P bioprosthetic MVR, moderately elevated gradients (mean 7 on January echo) - may cause symptomatic mitral stenosis during periods of RVR  5. ST elevation - nonspecific in the setting of LBBB and prominent changes of LVH due to HCM. Normal wall motion, patent coronaries, negative troponin.  6. Permanent atrial fibrillation, rate controlled. No anticoagulation due to repeated GI bleeding. If she develops rates> 90, start IV  diltiazem  7. Left subclavian dual chamber PPM, programmed VVI for permanent AF    Sanda Klein, MD, [facility] [phone] office [phone] pager 03/29/2015 12:14 PM

## 2015-03-29 NOTE — Progress Notes (Signed)
Pt states that she has passed gas twice and is feeling better than she was.

## 2015-03-29 NOTE — Progress Notes (Signed)
PROGRESS NOTE  Jasmine [name] BJY:[id number] DOB: 1924/10/17 DOA: 03/27/2015 PCP: [name], MD  Assessment/Plan: Abdominal/epigastric pain:  Sbo- due to adhesions -small bowel protocol per surgery -NG tube placed 10/21 with at least 700 ml out  Abnormal EKG -s/p cath 03/27/15. Coronaries wildely patent with preserved EF.  -No further w/u planned  Hypertension, uncontrolled -improved now. Will continue to monitor -Home meds held on admit. Currently on IV BB with prn meds ordered  CKD, stage III -stable. Baseline appears to be ~1-1.2  Chronic afib -rate controlled on IV BB -no anticoag with hx GIB  Code Status: full Family Communication: patient Disposition Plan:    Consultants:  Cards  surgery  Procedures:      HPI/Subjective: No flatus or BM but abd feeling better  Objective: Filed Vitals:   03/29/15 0444  BP: 123/52  Pulse: 79  Temp: 98.4 F (36.9 C)  Resp: 18    Intake/Output Summary (Last 24 hours) at 03/29/15 0928 Last data filed at 03/29/15 0865  Gross per 24 hour  Intake   1425 ml  Output   1150 ml  Net    275 ml   Filed Weights   03/27/15 2000 03/28/15 0200 03/28/15 1633  Weight: 71.8 kg (158 lb 4.6 oz) 72.4 kg (159 lb 9.8 oz) 74.6 kg (164 lb 7.4 oz)    Exam:   General:  Awake, NAD- NG tube in  Cardiovascular: rrr  Respiratory: clear, no increase work of breathing  Abdomen: tender to palpation  Musculoskeletal: no edema   Data Reviewed: Basic Metabolic Panel:  Recent Labs Lab 03/27/15 1825 03/27/15 1858 03/28/15 0320 03/29/15 0310  NA 138 140 139 134*  K 4.5 3.8 4.3 3.8  CL 102 102 104 101  CO2 27  --  26 26  GLUCOSE 122* 129* 127* 88  BUN 7 9 6 13   CREATININE 1.09* 0.90 0.91 1.07*  CALCIUM 9.7  --  8.9 8.0*   Liver Function Tests:  Recent Labs Lab 03/27/15 1825 03/28/15 1012  AST 42* 40  ALT 30 27  ALKPHOS 54 47  BILITOT 0.9 0.9  PROT 7.5 6.1*  ALBUMIN 4.0 3.3*    Recent Labs Lab  03/27/15 1825 03/28/15 1012  LIPASE 186* 104*  AMYLASE  --  134*   No results for input(s): AMMONIA in the last 168 hours. CBC:  Recent Labs Lab 03/27/15 1825 03/27/15 1858 03/28/15 0320 03/29/15 0310  WBC 14.9*  --  11.5* 10.1  NEUTROABS 12.5*  --   --   --   HGB 13.5 13.6 12.4 11.8*  HCT 41.4 40.0 38.2 37.0  MCV 91.4  --  90.7 91.6  PLT 211  --  189 189   Cardiac Enzymes:  Recent Labs Lab 03/27/15 1840  CKTOTAL 52  CKMB 2.6  TROPONINI <0.03   BNP (last 3 results)  Recent Labs  06/10/14 1952 06/23/14 0008  BNP 143.5* 430.5*    ProBNP (last 3 results) No results for input(s): PROBNP in the last 8760 hours.  CBG: No results for input(s): GLUCAP in the last 168 hours.  Recent Results (from the past 240 hour(s))  MRSA PCR Screening     Status: None   Collection Time: 03/27/15  8:00 PM  Result Value Ref Range Status   MRSA by PCR NEGATIVE NEGATIVE Final    Comment:        The GeneXpert MRSA Assay (FDA approved for NASAL specimens only), is one component of a comprehensive MRSA colonization  surveillance program. It is not intended to diagnose MRSA infection nor to guide or monitor treatment for MRSA infections.      Studies: Ct Abdomen Pelvis W Contrast  03/28/2015  CLINICAL DATA:  Generalized abdominal pain radiating to the epigastric area. Nausea. EXAM: CT ABDOMEN AND PELVIS WITH CONTRAST TECHNIQUE: Multidetector CT imaging of the abdomen and pelvis was performed using the standard protocol following bolus administration of intravenous contrast. CONTRAST:  164mL OMNIPAQUE IOHEXOL 300 MG/ML  SOLN COMPARISON:  CT 07/08/2012 FINDINGS: Lower chest: Small bilateral pleural effusions. Associated atelectasis the left lung base. There is oral contrast in the distal esophagus. Liver: No focal lesion. Hepatobiliary: Postcholecystectomy with stable intra and extrahepatic biliary prominence. No calcified choledocholithiasis. Pancreas: Generalized atrophy.  No  surrounding inflammation. Spleen: No focal lesion.  Small in size. Adrenal glands: No nodule. Kidneys: Symmetric renal enhancement and excretion. No hydronephrosis. Two cysts in the right kidney are unchanged. Stomach/Bowel: Enteric tube in place, tip in the stomach. The stomach is distended with ingested contrast. Duodenum diverticulum is again seen without inflammation. Proximal small bowel loops are fluid-filled and distended with a transition point in the mid lower abdomen, axial images 61-64. Small bowel loops distal to this are decompressed. Small amount of adjacent free fluid and mesenteric edema. No pneumatosis. There is diverticulosis throughout the colon without diverticulitis. Enteric sutures noted in the sigmoid colon. No colonic inflammation. The appendix is not seen. Vascular/Lymphatic: No retroperitoneal adenopathy. Abdominal aorta is normal in caliber. Atherosclerosis of the abdominal aorta without aneurysm. Reproductive: The uterus is atrophic, normal for age ovaries are not visualized. There is no adnexal mass. Bladder: Distended with excreted intravenous contrast. Other: No free air. No intra-abdominal fluid collection. Scarring in the left lower quadrant abdominal wall, likely prior colostomy site. Musculoskeletal: There are no acute or suspicious osseous abnormalities. Multilevel degenerative change throughout the lumbar spine. IMPRESSION: 1. Small bowel obstruction with transition point in the mid lower abdomen, likely due to adhesions. 2. Chronic findings include diverticulosis without diverticulitis, duodenum diverticulum, postcholecystectomy with stable biliary prominence, and right renal cysts. These results will be called to the ordering clinician or representative by the Radiologist Assistant, and communication documented in the PACS or zVision Dashboard. Electronically Signed   By: Jeb Levering M.D.   On: 03/28/2015 18:35   Dg Chest Port 1 View  03/27/2015  CLINICAL DATA:  Acute  onset of generalized chest pain. Initial encounter. EXAM: PORTABLE CHEST 1 VIEW COMPARISON:  Chest radiograph performed 06/22/2014 FINDINGS: The lungs are well-aerated. Minimal left basilar atelectasis is noted. There is no evidence of pleural effusion or pneumothorax. The cardiomediastinal silhouette is borderline enlarged. The patient is status post median sternotomy. A pacemaker is noted overlying the left chest wall, with leads ending overlying the right atrium and right ventricle. No acute osseous abnormalities are seen. IMPRESSION: Minimal left basilar atelectasis noted.  Borderline cardiomegaly. Electronically Signed   By: Garald Balding M.D.   On: 03/27/2015 22:32   Dg Abd Portable 1v  03/28/2015  CLINICAL DATA:  NG tube placement. EXAM: PORTABLE ABDOMEN - 1 VIEW COMPARISON:  04/05/2015 at 14:02 p.m. FINDINGS: Nasogastric tube is present with tip within the stomach in the left upper quadrant and side-port just above the gastroesophageal junction. This could be advanced another 5 cm. Persistent gaseous distention of the stomach and small bowel loops in the left upper quadrant unchanged. Air and stool within the colon. Remainder the exam is unchanged. IMPRESSION: Gaseous distention of the stomach and mildly dilated air-filled  small bowel loops in the left upper quadrant unchanged. Nasogastric tube with tip over the stomach and side-port just above the gastroesophageal junction. This could be advanced another 5 cm. Electronically Signed   By: Marin Olp M.D.   On: 03/28/2015 15:21   Dg Abd Portable 1v  03/28/2015  CLINICAL DATA:  NG tube placement. EXAM: PORTABLE ABDOMEN - 1 VIEW COMPARISON:  03/28/2015 at 9:36 a.m. FINDINGS: Nasogastric tube courses approximately 3 cm to the left lateral aspect of the spine over the lower thorax and turns back medially with tip just left of the spine at the level of the diaphragm. This may be within the esophagus just above the gastroesophageal junction although  cannot exclude location within the left lower lobe bronchi. Persistent gaseous distention of the stomach and a small bowel loops in the left upper abdomen. Remainder of the exam is unchanged. IMPRESSION: Nasogastric tube has tip at the level of the left hemidiaphragm just above the expected region of the gastroesophageal junction as this may be within the distal esophagus, although cannot exclude bronchial location. Persistent gaseous distention of the stomach and small bowel loops in the left upper quadrant without significant change. These results were called by telephone at the time of interpretation on 03/28/2015 at 2:17 pm to patient's nurse, Bobbe Medico, who verbally acknowledged these results. Electronically Signed   By: Marin Olp M.D.   On: 03/28/2015 14:18   Dg Abd Portable 2v  03/28/2015  CLINICAL DATA:  Abdominal pain EXAM: PORTABLE ABDOMEN - 2 VIEW COMPARISON:  08/13/2012 FINDINGS: Mild gaseous distention of the stomach and proximal small bowel with scattered small bowel air-fluid levels. Remainder of the bowel is decompressed. No free air organomegaly. Surgical clips in the left pelvis. Contrast material noted within the urinary bladder of unknown etiology. IMPRESSION: Mild gaseous distention of the stomach and proximal small bowel. Cannot exclude proximal partial small bowel obstruction. Electronically Signed   By: Rolm Baptise M.D.   On: 03/28/2015 09:47    Scheduled Meds: . budesonide  0.25 mg Nebulization BID  . dorzolamide-timolol  1 drop Both Eyes BID  . enoxaparin (LOVENOX) injection  40 mg Subcutaneous Q24H  . metoprolol  5 mg Intravenous 4 times per day  . sodium chloride  3 mL Intravenous Q12H   Continuous Infusions: . sodium chloride 125 mL/hr at 03/29/15 4431   Antibiotics Given (last 72 hours)    None      Principal Problem:   Epigastric pain Active Problems:   Pacemaker-St.Jude   Chronic atrial fibrillation (HCC)   Warfarin anticoagulation   History of  mitral valve replacement with bioprosthetic valve   IHSS (idiopathic hypertrophic subaortic stenosis) (HCC)   Chronic kidney disease (CKD), stage III (moderate)   Non-obstructive coronary artery disease   Essential hypertension   Abnormal finding on EKG    Time spent: 25 min    Anish Vana  [facility] Pager [phone]. If 7PM-7AM, please contact night-coverage at [email or web address], password [facility] 03/29/2015, 9:28 AM  LOS: 2 days

## 2015-03-29 NOTE — Progress Notes (Signed)
2 Days Post-Op  Subjective: Feels better. No n/v. Some abd pain but better. No flatus. 700cc out of ng  Objective: Vital signs in last 24 hours: Temp:  [98.4 F (36.9 C)-98.8 F (37.1 C)] 98.4 F (36.9 C) (10/22 0444) Pulse Rate:  [63-100] 79 (10/22 0444) Resp:  [11-23] 18 (10/22 0444) BP: (121-155)/(52-79) 123/52 mmHg (10/22 0444) SpO2:  [93 %-97 %] 97 % (10/22 0444) Weight:  [74.6 kg (164 lb 7.4 oz)] 74.6 kg (164 lb 7.4 oz) (10/21 1633) Last BM Date: 03/26/15  Intake/Output from previous day: 10/21 0701 - 10/22 0700 In: 1469.2 [I.V.:1469.2] Out: 700 [Emesis/NG output:700] Intake/Output this shift:    Alert, nad, nontoxic cta b/l Soft, mild distension, mild L TTP, no guarding/rebound  Lab Results:   Recent Labs  03/28/15 0320 03/29/15 0310  WBC 11.5* 10.1  HGB 12.4 11.8*  HCT 38.2 37.0  PLT 189 189   BMET  Recent Labs  03/28/15 0320 03/29/15 0310  NA 139 134*  K 4.3 3.8  CL 104 101  CO2 26 26  GLUCOSE 127* 88  BUN 6 13  CREATININE 0.91 1.07*  CALCIUM 8.9 8.0*   PT/INR  Recent Labs  03/27/15 1840  LABPROT 16.1*  INR 1.28   ABG No results for input(s): PHART, HCO3 in the last 72 hours.  Invalid input(s): PCO2, PO2  Studies/Results: Ct Abdomen Pelvis W Contrast  03/28/2015  CLINICAL DATA:  Generalized abdominal pain radiating to the epigastric area. Nausea. EXAM: CT ABDOMEN AND PELVIS WITH CONTRAST TECHNIQUE: Multidetector CT imaging of the abdomen and pelvis was performed using the standard protocol following bolus administration of intravenous contrast. CONTRAST:  129mL OMNIPAQUE IOHEXOL 300 MG/ML  SOLN COMPARISON:  CT 07/08/2012 FINDINGS: Lower chest: Small bilateral pleural effusions. Associated atelectasis the left lung base. There is oral contrast in the distal esophagus. Liver: No focal lesion. Hepatobiliary: Postcholecystectomy with stable intra and extrahepatic biliary prominence. No calcified choledocholithiasis. Pancreas: Generalized  atrophy.  No surrounding inflammation. Spleen: No focal lesion.  Small in size. Adrenal glands: No nodule. Kidneys: Symmetric renal enhancement and excretion. No hydronephrosis. Two cysts in the right kidney are unchanged. Stomach/Bowel: Enteric tube in place, tip in the stomach. The stomach is distended with ingested contrast. Duodenum diverticulum is again seen without inflammation. Proximal small bowel loops are fluid-filled and distended with a transition point in the mid lower abdomen, axial images 61-64. Small bowel loops distal to this are decompressed. Small amount of adjacent free fluid and mesenteric edema. No pneumatosis. There is diverticulosis throughout the colon without diverticulitis. Enteric sutures noted in the sigmoid colon. No colonic inflammation. The appendix is not seen. Vascular/Lymphatic: No retroperitoneal adenopathy. Abdominal aorta is normal in caliber. Atherosclerosis of the abdominal aorta without aneurysm. Reproductive: The uterus is atrophic, normal for age ovaries are not visualized. There is no adnexal mass. Bladder: Distended with excreted intravenous contrast. Other: No free air. No intra-abdominal fluid collection. Scarring in the left lower quadrant abdominal wall, likely prior colostomy site. Musculoskeletal: There are no acute or suspicious osseous abnormalities. Multilevel degenerative change throughout the lumbar spine. IMPRESSION: 1. Small bowel obstruction with transition point in the mid lower abdomen, likely due to adhesions. 2. Chronic findings include diverticulosis without diverticulitis, duodenum diverticulum, postcholecystectomy with stable biliary prominence, and right renal cysts. These results will be called to the ordering clinician or representative by the Radiologist Assistant, and communication documented in the PACS or zVision Dashboard. Electronically Signed   By: Jeb Levering M.D.   On:  03/28/2015 18:35   Dg Chest Port 1 View  03/27/2015  CLINICAL  DATA:  Acute onset of generalized chest pain. Initial encounter. EXAM: PORTABLE CHEST 1 VIEW COMPARISON:  Chest radiograph performed 06/22/2014 FINDINGS: The lungs are well-aerated. Minimal left basilar atelectasis is noted. There is no evidence of pleural effusion or pneumothorax. The cardiomediastinal silhouette is borderline enlarged. The patient is status post median sternotomy. A pacemaker is noted overlying the left chest wall, with leads ending overlying the right atrium and right ventricle. No acute osseous abnormalities are seen. IMPRESSION: Minimal left basilar atelectasis noted.  Borderline cardiomegaly. Electronically Signed   By: Garald Balding M.D.   On: 03/27/2015 22:32   Dg Abd Portable 1v  03/28/2015  CLINICAL DATA:  NG tube placement. EXAM: PORTABLE ABDOMEN - 1 VIEW COMPARISON:  04/05/2015 at 14:02 p.m. FINDINGS: Nasogastric tube is present with tip within the stomach in the left upper quadrant and side-port just above the gastroesophageal junction. This could be advanced another 5 cm. Persistent gaseous distention of the stomach and small bowel loops in the left upper quadrant unchanged. Air and stool within the colon. Remainder the exam is unchanged. IMPRESSION: Gaseous distention of the stomach and mildly dilated air-filled small bowel loops in the left upper quadrant unchanged. Nasogastric tube with tip over the stomach and side-port just above the gastroesophageal junction. This could be advanced another 5 cm. Electronically Signed   By: Marin Olp M.D.   On: 03/28/2015 15:21   Dg Abd Portable 1v  03/28/2015  CLINICAL DATA:  NG tube placement. EXAM: PORTABLE ABDOMEN - 1 VIEW COMPARISON:  03/28/2015 at 9:36 a.m. FINDINGS: Nasogastric tube courses approximately 3 cm to the left lateral aspect of the spine over the lower thorax and turns back medially with tip just left of the spine at the level of the diaphragm. This may be within the esophagus just above the gastroesophageal junction  although cannot exclude location within the left lower lobe bronchi. Persistent gaseous distention of the stomach and a small bowel loops in the left upper abdomen. Remainder of the exam is unchanged. IMPRESSION: Nasogastric tube has tip at the level of the left hemidiaphragm just above the expected region of the gastroesophageal junction as this may be within the distal esophagus, although cannot exclude bronchial location. Persistent gaseous distention of the stomach and small bowel loops in the left upper quadrant without significant change. These results were called by telephone at the time of interpretation on 03/28/2015 at 2:17 pm to patient's nurse, Bobbe Medico, who verbally acknowledged these results. Electronically Signed   By: Marin Olp M.D.   On: 03/28/2015 14:18   Dg Abd Portable 2v  03/28/2015  CLINICAL DATA:  Abdominal pain EXAM: PORTABLE ABDOMEN - 2 VIEW COMPARISON:  08/13/2012 FINDINGS: Mild gaseous distention of the stomach and proximal small bowel with scattered small bowel air-fluid levels. Remainder of the bowel is decompressed. No free air organomegaly. Surgical clips in the left pelvis. Contrast material noted within the urinary bladder of unknown etiology. IMPRESSION: Mild gaseous distention of the stomach and proximal small bowel. Cannot exclude proximal partial small bowel obstruction. Electronically Signed   By: Rolm Baptise M.D.   On: 03/28/2015 09:47    Anti-infectives: Anti-infectives    None      Assessment/Plan: s/p Procedure(s): Left Heart Cath and Coronary Angiography (N/A) psbo - no fever. No wbc. No tachycardia. Feels better but no flatus. Plain film - still with some dilated loops. Will start small bowel protocol.  Cont bowel rest  Leighton Ruff. Redmond Pulling, MD, FACS General, Bariatric, & Minimally Invasive Surgery [facility], [place]   LOS: 2 days    Gayland Curry 03/29/2015

## 2015-03-30 ENCOUNTER — Inpatient Hospital Stay (HOSPITAL_COMMUNITY): Payer: Commercial Managed Care - HMO

## 2015-03-30 DIAGNOSIS — Z7901 Long term (current) use of anticoagulants: Secondary | ICD-10-CM

## 2015-03-30 LAB — CBC
HCT: 35.2 % — ABNORMAL LOW (ref 36.0–46.0)
Hemoglobin: 11.1 g/dL — ABNORMAL LOW (ref 12.0–15.0)
MCH: 29 pg (ref 26.0–34.0)
MCHC: 31.5 g/dL (ref 30.0–36.0)
MCV: 91.9 fL (ref 78.0–100.0)
PLATELETS: 198 10*3/uL (ref 150–400)
RBC: 3.83 MIL/uL — AB (ref 3.87–5.11)
RDW: 15.1 % (ref 11.5–15.5)
WBC: 6.5 10*3/uL (ref 4.0–10.5)

## 2015-03-30 LAB — BASIC METABOLIC PANEL
Anion gap: 8 (ref 5–15)
BUN: 19 mg/dL (ref 6–20)
CO2: 23 mmol/L (ref 22–32)
CREATININE: 1.14 mg/dL — AB (ref 0.44–1.00)
Calcium: 7.7 mg/dL — ABNORMAL LOW (ref 8.9–10.3)
Chloride: 107 mmol/L (ref 101–111)
GFR calc Af Amer: 48 mL/min — ABNORMAL LOW (ref >60)
GFR, EST NON AFRICAN AMERICAN: 42 mL/min — AB (ref >60)
Glucose, Bld: 87 mg/dL (ref 65–99)
POTASSIUM: 3.5 mmol/L (ref 3.5–5.1)
SODIUM: 138 mmol/L (ref 135–145)

## 2015-03-30 LAB — MAGNESIUM: MAGNESIUM: 2.5 mg/dL — AB (ref 1.7–2.4)

## 2015-03-30 MED ORDER — METOPROLOL TARTRATE 50 MG PO TABS
50.0000 mg | ORAL_TABLET | Freq: Two times a day (BID) | ORAL | Status: DC
  Administered 2015-03-30 – 2015-04-02 (×7): 50 mg via ORAL
  Filled 2015-03-30 (×7): qty 1

## 2015-03-30 MED ORDER — DILTIAZEM HCL ER COATED BEADS 120 MG PO CP24
120.0000 mg | ORAL_CAPSULE | Freq: Every day | ORAL | Status: DC
  Administered 2015-03-30 – 2015-04-02 (×4): 120 mg via ORAL
  Filled 2015-03-30 (×4): qty 1

## 2015-03-30 MED ORDER — POTASSIUM CHLORIDE CRYS ER 20 MEQ PO TBCR
40.0000 meq | EXTENDED_RELEASE_TABLET | Freq: Once | ORAL | Status: AC
  Administered 2015-03-30: 40 meq via ORAL
  Filled 2015-03-30: qty 2

## 2015-03-30 MED ORDER — METOPROLOL TARTRATE 50 MG PO TABS: 50.0000 mg | ORAL_TABLET | Freq: Two times a day (BID) | ORAL | Status: DC

## 2015-03-30 NOTE — Progress Notes (Signed)
PROGRESS NOTE  Jasmine [name] DJS:[id number] DOB: 1924/06/28 DOA: 03/27/2015 PCP: [name], MD  81yo female with pmh of HCM, stage III CKD, persistant afib not on anticoagulation secondary to hx of GI bleed, pacemaker, GERD. Pt presented to ED on 03/27/15 with complaints of abdominal and epigastric pain since Wednesday evening. Pt noted to have inferior ST elevation on EKG done in ED. She was admitted by cardiology and underwent cardiac cath last evening. Cath showed normal CAD and normal systolic function despite abnormal ekg. [facility] has been asked to consult to further evaluate pt's abdominal pain.  Found to have SBO- NG Tube placed and removed after SBO resolved   Assessment/Plan: Abdominal/epigastric pain:  Sbo- due to adhesions- resolved, NG tube removed -small bowel protocol per surgery 10/22 -full liquids-- surgery following  Abnormal EKG -s/p cath 03/27/15. Coronaries wildely patent with preserved EF.  -No further w/u planned  Hypertension, uncontrolled -improved now. Will continue to monitor -Home meds held on admit. Currently on IV BB with prn meds ordered  CKD, stage III -stable. Baseline appears to be ~1-1.2  Chronic afib -resume PO rate controlling meds- cardizem and BB- with holding parameter -no anticoag with hx GIB  Code Status: full Family Communication: patient Disposition Plan:    Consultants:  Cards  surgery  Procedures:      HPI/Subjective: +BM and flatus NG tube removed Feeling much better  Objective: Filed Vitals:   03/30/15 0900  BP:   Pulse: 86  Temp:   Resp: 16    Intake/Output Summary (Last 24 hours) at 03/30/15 0943 Last data filed at 03/30/15 0900  Gross per 24 hour  Intake   3115 ml  Output   3325 ml  Net   -210 ml   Filed Weights   03/27/15 2000 03/28/15 0200 03/28/15 1633  Weight: 71.8 kg (158 lb 4.6 oz) 72.4 kg (159 lb 9.8 oz) 74.6 kg (164 lb 7.4 oz)    Exam:   General:  Awake,  NAD  Cardiovascular: irr  Respiratory: clear, no increase work of breathing  Abdomen: still some tenderness, + BS  Musculoskeletal: no edema   Data Reviewed: Basic Metabolic Panel:  Recent Labs Lab 03/27/15 1825 03/27/15 1858 03/28/15 0320 03/29/15 0310 03/30/15 0323  NA 138 140 139 134* 138  K 4.5 3.8 4.3 3.8 3.5  CL 102 102 104 101 107  CO2 27  --  26 26 23   GLUCOSE 122* 129* 127* 88 87  BUN 7 9 6 13 19   CREATININE 1.09* 0.90 0.91 1.07* 1.14*  CALCIUM 9.7  --  8.9 8.0* 7.7*   Liver Function Tests:  Recent Labs Lab 03/27/15 1825 03/28/15 1012  AST 42* 40  ALT 30 27  ALKPHOS 54 47  BILITOT 0.9 0.9  PROT 7.5 6.1*  ALBUMIN 4.0 3.3*    Recent Labs Lab 03/27/15 1825 03/28/15 1012  LIPASE 186* 104*  AMYLASE  --  134*   No results for input(s): AMMONIA in the last 168 hours. CBC:  Recent Labs Lab 03/27/15 1825 03/27/15 1858 03/28/15 0320 03/29/15 0310 03/30/15 0323  WBC 14.9*  --  11.5* 10.1 6.5  NEUTROABS 12.5*  --   --   --   --   HGB 13.5 13.6 12.4 11.8* 11.1*  HCT 41.4 40.0 38.2 37.0 35.2*  MCV 91.4  --  90.7 91.6 91.9  PLT 211  --  189 189 198   Cardiac Enzymes:  Recent Labs Lab 03/27/15 1840  CKTOTAL 52  CKMB 2.6  TROPONINI <0.03   BNP (last 3 results)  Recent Labs  06/10/14 1952 06/23/14 0008  BNP 143.5* 430.5*    ProBNP (last 3 results) No results for input(s): PROBNP in the last 8760 hours.  CBG: No results for input(s): GLUCAP in the last 168 hours.  Recent Results (from the past 240 hour(s))  MRSA PCR Screening     Status: None   Collection Time: 03/27/15  8:00 PM  Result Value Ref Range Status   MRSA by PCR NEGATIVE NEGATIVE Final    Comment:        The GeneXpert MRSA Assay (FDA approved for NASAL specimens only), is one component of a comprehensive MRSA colonization surveillance program. It is not intended to diagnose MRSA infection nor to guide or monitor treatment for MRSA infections.       Studies: Ct Abdomen Pelvis W Contrast  03/28/2015  CLINICAL DATA:  Generalized abdominal pain radiating to the epigastric area. Nausea. EXAM: CT ABDOMEN AND PELVIS WITH CONTRAST TECHNIQUE: Multidetector CT imaging of the abdomen and pelvis was performed using the standard protocol following bolus administration of intravenous contrast. CONTRAST:  181mL OMNIPAQUE IOHEXOL 300 MG/ML  SOLN COMPARISON:  CT 07/08/2012 FINDINGS: Lower chest: Small bilateral pleural effusions. Associated atelectasis the left lung base. There is oral contrast in the distal esophagus. Liver: No focal lesion. Hepatobiliary: Postcholecystectomy with stable intra and extrahepatic biliary prominence. No calcified choledocholithiasis. Pancreas: Generalized atrophy.  No surrounding inflammation. Spleen: No focal lesion.  Small in size. Adrenal glands: No nodule. Kidneys: Symmetric renal enhancement and excretion. No hydronephrosis. Two cysts in the right kidney are unchanged. Stomach/Bowel: Enteric tube in place, tip in the stomach. The stomach is distended with ingested contrast. Duodenum diverticulum is again seen without inflammation. Proximal small bowel loops are fluid-filled and distended with a transition point in the mid lower abdomen, axial images 61-64. Small bowel loops distal to this are decompressed. Small amount of adjacent free fluid and mesenteric edema. No pneumatosis. There is diverticulosis throughout the colon without diverticulitis. Enteric sutures noted in the sigmoid colon. No colonic inflammation. The appendix is not seen. Vascular/Lymphatic: No retroperitoneal adenopathy. Abdominal aorta is normal in caliber. Atherosclerosis of the abdominal aorta without aneurysm. Reproductive: The uterus is atrophic, normal for age ovaries are not visualized. There is no adnexal mass. Bladder: Distended with excreted intravenous contrast. Other: No free air. No intra-abdominal fluid collection. Scarring in the left lower quadrant  abdominal wall, likely prior colostomy site. Musculoskeletal: There are no acute or suspicious osseous abnormalities. Multilevel degenerative change throughout the lumbar spine. IMPRESSION: 1. Small bowel obstruction with transition point in the mid lower abdomen, likely due to adhesions. 2. Chronic findings include diverticulosis without diverticulitis, duodenum diverticulum, postcholecystectomy with stable biliary prominence, and right renal cysts. These results will be called to the ordering clinician or representative by the Radiologist Assistant, and communication documented in the PACS or zVision Dashboard. Electronically Signed   By: Jeb Levering M.D.   On: 03/28/2015 18:35   Dg Abd Portable 1v  03/30/2015  CLINICAL DATA:  Abdominal distention. EXAM: PORTABLE ABDOMEN - 1 VIEW COMPARISON:  One-view abdomen 03/29/2015. FINDINGS: The bowel gas pattern is normal. Contrast is progressing, now evident within the ascending and proximal transverse colon. Urinary contrast is present in the bladder. Surgical clips are stable. IMPRESSION: Progression of contrast into the ascending and proximal transverse colon. Electronically Signed   By: San Morelle M.D.   On: 03/30/2015 09:36   Dg Abd Portable  1v-small Bowel Obstruction Protocol-initial, 8 Hr Delay  03/29/2015  CLINICAL DATA:  Small-bowel obstruction, left-sided abdominal pain since Thursday. EXAM: PORTABLE ABDOMEN - 1 VIEW COMPARISON:  Abdomen plain film from earlier same day. FINDINGS: Nasogastric tube remains well positioned with tip in the expected region of the stomach pylorus/ proximal duodenum. Contrast material again noted within the bladder. Surgical clips again seen within the left pelvis. The dilated small bowel loops within the left upper quadrant are unchanged. No new area of bowel dilatation seen. No evidence of free intraperitoneal air seen. No abnormal fluid collection. Degenerative changes again noted within the lumbar spine. No  acute osseous abnormality seen. IMPRESSION: Persistently dilated small bowel loops within the left upper quadrant consistent with the given clinical data of small bowel obstruction. No new findings seen. Electronically Signed   By: Franki Cabot M.D.   On: 03/29/2015 18:58   Dg Abd Portable 1v  03/29/2015  CLINICAL DATA:  Small bowel obstruction, no bowel movement for 3 days, periumbilical pain, history diverticulosis EXAM: PORTABLE ABDOMEN - 1 VIEW COMPARISON:  Portable exam 0744 hours compared to CT abdomen and pelvis 03/28/2015 FINDINGS: Tip of nasogastric tube projects over distal gastric antrum Excreted contrast material within urinary bladder. Surgical clips LEFT upper quadrant and LEFT pelvis. Persisting gas distention of small bowel loops in the LEFT upper quadrant compatible with small bowel obstruction. No bowel wall thickening or or evidence of free intraperitoneal air on supine imaging. Degenerative disc and facet disease changes lumbar spine. IMPRESSION: Persistent dilatation of small bowel loops in the LEFT upper abdomen consistent with small bowel obstruction. Electronically Signed   By: Lavonia Dana M.D.   On: 03/29/2015 09:41   Dg Abd Portable 1v  03/28/2015  CLINICAL DATA:  NG tube placement. EXAM: PORTABLE ABDOMEN - 1 VIEW COMPARISON:  04/05/2015 at 14:02 p.m. FINDINGS: Nasogastric tube is present with tip within the stomach in the left upper quadrant and side-port just above the gastroesophageal junction. This could be advanced another 5 cm. Persistent gaseous distention of the stomach and small bowel loops in the left upper quadrant unchanged. Air and stool within the colon. Remainder the exam is unchanged. IMPRESSION: Gaseous distention of the stomach and mildly dilated air-filled small bowel loops in the left upper quadrant unchanged. Nasogastric tube with tip over the stomach and side-port just above the gastroesophageal junction. This could be advanced another 5 cm. Electronically  Signed   By: Marin Olp M.D.   On: 03/28/2015 15:21   Dg Abd Portable 1v  03/28/2015  CLINICAL DATA:  NG tube placement. EXAM: PORTABLE ABDOMEN - 1 VIEW COMPARISON:  03/28/2015 at 9:36 a.m. FINDINGS: Nasogastric tube courses approximately 3 cm to the left lateral aspect of the spine over the lower thorax and turns back medially with tip just left of the spine at the level of the diaphragm. This may be within the esophagus just above the gastroesophageal junction although cannot exclude location within the left lower lobe bronchi. Persistent gaseous distention of the stomach and a small bowel loops in the left upper abdomen. Remainder of the exam is unchanged. IMPRESSION: Nasogastric tube has tip at the level of the left hemidiaphragm just above the expected region of the gastroesophageal junction as this may be within the distal esophagus, although cannot exclude bronchial location. Persistent gaseous distention of the stomach and small bowel loops in the left upper quadrant without significant change. These results were called by telephone at the time of interpretation on 03/28/2015 at 2:17  pm to patient's nurse, Bobbe Medico, who verbally acknowledged these results. Electronically Signed   By: Marin Olp M.D.   On: 03/28/2015 14:18    Scheduled Meds: . budesonide  0.25 mg Nebulization BID  . dorzolamide-timolol  1 drop Both Eyes BID  . enoxaparin (LOVENOX) injection  40 mg Subcutaneous Q24H  . metoprolol  5 mg Intravenous 4 times per day  . potassium chloride  40 mEq Oral Once  . sodium chloride  3 mL Intravenous Q12H   Continuous Infusions: . sodium chloride 125 mL/hr at 03/30/15 7048   Antibiotics Given (last 72 hours)    None      Principal Problem:   Epigastric pain Active Problems:   Pacemaker-[employer]   Chronic atrial fibrillation (HCC)   Warfarin anticoagulation   History of mitral valve replacement with bioprosthetic valve   IHSS (idiopathic hypertrophic subaortic  stenosis) (HCC)   Chronic kidney disease (CKD), stage III (moderate)   Non-obstructive coronary artery disease   Essential hypertension   Abnormal finding on EKG   Small bowel obstruction due to adhesions ([place])   SBO (small bowel obstruction) ([place])    Time spent: 25 min    Gita Dilger  [facility] Pager [phone]. If 7PM-7AM, please contact night-coverage at [email or web address], password [facility] 03/30/2015, 9:43 AM  LOS: 3 days

## 2015-03-30 NOTE — Progress Notes (Signed)
3 Days Post-Op  Subjective: Pt having BMs  Objective: Vital signs in last 24 hours: Temp:  [98.3 F (36.8 C)-98.8 F (37.1 C)] 98.8 F (37.1 C) (10/23 0512) Pulse Rate:  [70-111] 87 (10/23 0616) Resp:  [17-18] 17 (10/23 0512) BP: (94-129)/(39-69) 94/42 mmHg (10/23 0616) SpO2:  [95 %-100 %] 98 % (10/23 0512) Last BM Date: 03/30/15  Intake/Output from previous day: 10/22 0701 - 10/23 0700 In: 2875 [I.V.:2875] Out: 3775 [Urine:700; Emesis/NG output:3075] Intake/Output this shift:    General appearance: alert and cooperative GI: soft, non-tender; bowel sounds normal; no masses,  no organomegaly  Lab Results:   Recent Labs  03/29/15 0310 03/30/15 0323  WBC 10.1 6.5  HGB 11.8* 11.1*  HCT 37.0 35.2*  PLT 189 198   BMET  Recent Labs  03/29/15 0310 03/30/15 0323  NA 134* 138  K 3.8 3.5  CL 101 107  CO2 26 23  GLUCOSE 88 87  BUN 13 19  CREATININE 1.07* 1.14*  CALCIUM 8.0* 7.7*   PT/INR  Recent Labs  03/27/15 1840  LABPROT 16.1*  INR 1.28   ABG No results for input(s): PHART, HCO3 in the last 72 hours.  Invalid input(s): PCO2, PO2  Studies/Results: Ct Abdomen Pelvis W Contrast  03/28/2015  CLINICAL DATA:  Generalized abdominal pain radiating to the epigastric area. Nausea. EXAM: CT ABDOMEN AND PELVIS WITH CONTRAST TECHNIQUE: Multidetector CT imaging of the abdomen and pelvis was performed using the standard protocol following bolus administration of intravenous contrast. CONTRAST:  129mL OMNIPAQUE IOHEXOL 300 MG/ML  SOLN COMPARISON:  CT 07/08/2012 FINDINGS: Lower chest: Small bilateral pleural effusions. Associated atelectasis the left lung base. There is oral contrast in the distal esophagus. Liver: No focal lesion. Hepatobiliary: Postcholecystectomy with stable intra and extrahepatic biliary prominence. No calcified choledocholithiasis. Pancreas: Generalized atrophy.  No surrounding inflammation. Spleen: No focal lesion.  Small in size. Adrenal glands: No  nodule. Kidneys: Symmetric renal enhancement and excretion. No hydronephrosis. Two cysts in the right kidney are unchanged. Stomach/Bowel: Enteric tube in place, tip in the stomach. The stomach is distended with ingested contrast. Duodenum diverticulum is again seen without inflammation. Proximal small bowel loops are fluid-filled and distended with a transition point in the mid lower abdomen, axial images 61-64. Small bowel loops distal to this are decompressed. Small amount of adjacent free fluid and mesenteric edema. No pneumatosis. There is diverticulosis throughout the colon without diverticulitis. Enteric sutures noted in the sigmoid colon. No colonic inflammation. The appendix is not seen. Vascular/Lymphatic: No retroperitoneal adenopathy. Abdominal aorta is normal in caliber. Atherosclerosis of the abdominal aorta without aneurysm. Reproductive: The uterus is atrophic, normal for age ovaries are not visualized. There is no adnexal mass. Bladder: Distended with excreted intravenous contrast. Other: No free air. No intra-abdominal fluid collection. Scarring in the left lower quadrant abdominal wall, likely prior colostomy site. Musculoskeletal: There are no acute or suspicious osseous abnormalities. Multilevel degenerative change throughout the lumbar spine. IMPRESSION: 1. Small bowel obstruction with transition point in the mid lower abdomen, likely due to adhesions. 2. Chronic findings include diverticulosis without diverticulitis, duodenum diverticulum, postcholecystectomy with stable biliary prominence, and right renal cysts. These results will be called to the ordering clinician or representative by the Radiologist Assistant, and communication documented in the PACS or zVision Dashboard. Electronically Signed   By: Jeb Levering M.D.   On: 03/28/2015 18:35   Dg Abd Portable 1v-small Bowel Obstruction Protocol-initial, 8 Hr Delay  03/29/2015  CLINICAL DATA:  Small-bowel obstruction, left-sided  abdominal pain since Thursday. EXAM: PORTABLE ABDOMEN - 1 VIEW COMPARISON:  Abdomen plain film from earlier same day. FINDINGS: Nasogastric tube remains well positioned with tip in the expected region of the stomach pylorus/ proximal duodenum. Contrast material again noted within the bladder. Surgical clips again seen within the left pelvis. The dilated small bowel loops within the left upper quadrant are unchanged. No new area of bowel dilatation seen. No evidence of free intraperitoneal air seen. No abnormal fluid collection. Degenerative changes again noted within the lumbar spine. No acute osseous abnormality seen. IMPRESSION: Persistently dilated small bowel loops within the left upper quadrant consistent with the given clinical data of small bowel obstruction. No new findings seen. Electronically Signed   By: Franki Cabot M.D.   On: 03/29/2015 18:58   Dg Abd Portable 1v  03/29/2015  CLINICAL DATA:  Small bowel obstruction, no bowel movement for 3 days, periumbilical pain, history diverticulosis EXAM: PORTABLE ABDOMEN - 1 VIEW COMPARISON:  Portable exam 0744 hours compared to CT abdomen and pelvis 03/28/2015 FINDINGS: Tip of nasogastric tube projects over distal gastric antrum Excreted contrast material within urinary bladder. Surgical clips LEFT upper quadrant and LEFT pelvis. Persisting gas distention of small bowel loops in the LEFT upper quadrant compatible with small bowel obstruction. No bowel wall thickening or or evidence of free intraperitoneal air on supine imaging. Degenerative disc and facet disease changes lumbar spine. IMPRESSION: Persistent dilatation of small bowel loops in the LEFT upper abdomen consistent with small bowel obstruction. Electronically Signed   By: Lavonia Dana M.D.   On: 03/29/2015 09:41   Dg Abd Portable 1v  03/28/2015  CLINICAL DATA:  NG tube placement. EXAM: PORTABLE ABDOMEN - 1 VIEW COMPARISON:  04/05/2015 at 14:02 p.m. FINDINGS: Nasogastric tube is present with tip  within the stomach in the left upper quadrant and side-port just above the gastroesophageal junction. This could be advanced another 5 cm. Persistent gaseous distention of the stomach and small bowel loops in the left upper quadrant unchanged. Air and stool within the colon. Remainder the exam is unchanged. IMPRESSION: Gaseous distention of the stomach and mildly dilated air-filled small bowel loops in the left upper quadrant unchanged. Nasogastric tube with tip over the stomach and side-port just above the gastroesophageal junction. This could be advanced another 5 cm. Electronically Signed   By: Marin Olp M.D.   On: 03/28/2015 15:21   Dg Abd Portable 1v  03/28/2015  CLINICAL DATA:  NG tube placement. EXAM: PORTABLE ABDOMEN - 1 VIEW COMPARISON:  03/28/2015 at 9:36 a.m. FINDINGS: Nasogastric tube courses approximately 3 cm to the left lateral aspect of the spine over the lower thorax and turns back medially with tip just left of the spine at the level of the diaphragm. This may be within the esophagus just above the gastroesophageal junction although cannot exclude location within the left lower lobe bronchi. Persistent gaseous distention of the stomach and a small bowel loops in the left upper abdomen. Remainder of the exam is unchanged. IMPRESSION: Nasogastric tube has tip at the level of the left hemidiaphragm just above the expected region of the gastroesophageal junction as this may be within the distal esophagus, although cannot exclude bronchial location. Persistent gaseous distention of the stomach and small bowel loops in the left upper quadrant without significant change. These results were called by telephone at the time of interpretation on 03/28/2015 at 2:17 pm to patient's nurse, Bobbe Medico, who verbally acknowledged these results. Electronically Signed   By: Quillian Quince  Derrel Nip M.D.   On: 03/28/2015 14:18   Dg Abd Portable 2v  03/28/2015  CLINICAL DATA:  Abdominal pain EXAM: PORTABLE ABDOMEN -  2 VIEW COMPARISON:  08/13/2012 FINDINGS: Mild gaseous distention of the stomach and proximal small bowel with scattered small bowel air-fluid levels. Remainder of the bowel is decompressed. No free air organomegaly. Surgical clips in the left pelvis. Contrast material noted within the urinary bladder of unknown etiology. IMPRESSION: Mild gaseous distention of the stomach and proximal small bowel. Cannot exclude proximal partial small bowel obstruction. Electronically Signed   By: Rolm Baptise M.D.   On: 03/28/2015 09:47    Anti-infectives: Anti-infectives    None      Assessment/Plan: s/p Procedure(s): Left Heart Cath and Coronary Angiography (N/A)  DC NGT Adv to CLD and likely adv as tol   LOS: 3 days    Rosario Jacks., [facility] 03/30/2015

## 2015-03-30 NOTE — Progress Notes (Addendum)
Patient Name: Jasmine [name] Date of Encounter: 03/30/2015  Principal Problem:   Epigastric pain Active Problems:   Pacemaker-St.Jude   Chronic atrial fibrillation (HCC)   Warfarin anticoagulation   History of mitral valve replacement with bioprosthetic valve   IHSS (idiopathic hypertrophic subaortic stenosis) (HCC)   Chronic kidney disease (CKD), stage III (moderate)   Non-obstructive coronary artery disease   Essential hypertension   Abnormal finding on EKG   Small bowel obstruction due to adhesions ([place])   SBO (small bowel obstruction) ([place])   Length of Stay: 3  SUBJECTIVE  Feels much better.  No CP  No SOB   Reestablished normal transit, multiple bowel movements, taking PO fluids and meds.  CURRENT MEDS . budesonide  0.25 mg Nebulization BID  . diltiazem  120 mg Oral Daily  . dorzolamide-timolol  1 drop Both Eyes BID  . enoxaparin (LOVENOX) injection  40 mg Subcutaneous Q24H  . metoprolol tartrate  50 mg Oral BID  . sodium chloride  3 mL Intravenous Q12H    OBJECTIVE   Intake/Output Summary (Last 24 hours) at 03/30/15 1044 Last data filed at 03/30/15 0900  Gross per 24 hour  Intake   3115 ml  Output   3325 ml  Net   -210 ml   Filed Weights   03/27/15 2000 03/28/15 0200 03/28/15 1633  Weight: 158 lb 4.6 oz (71.8 kg) 159 lb 9.8 oz (72.4 kg) 164 lb 7.4 oz (74.6 kg)    PHYSICAL EXAM Filed Vitals:   03/30/15 0534 03/30/15 0616 03/30/15 0900 03/30/15 1000  BP: 103/39 94/42  114/54  Pulse:  87 86   Temp:      TempSrc:      Resp:   16   Height:      Weight:      SpO2:   98%    General: Alert, oriented x3, no distress Head: no evidence of trauma, PERRL, EOMI, no exophtalmos or lid lag, no myxedema, no xanthelasma; normal ears, nose and oropharynx Neck: normal jugular venous pulsations and no hepatojugular reflux; brisk carotid pulses without delay and no carotid bruits Chest: clear to auscultation, no signs of consolidation by percussion or palpation,  normal fremitus, symmetrical and full respiratory excursions Cardiovascular: normal position and quality of the apical impulse, irregular rhythm, normal first and second heart sounds, no rubs or gallops, no murmur Abdomen: no tenderness , less distention, no masses by palpation, no abnormal pulsatility or arterial bruits, normal bowel sounds, no hepatosplenomegaly Extremities: no clubbing, cyanosis or edema; 2+ radial, ulnar and brachial pulses bilaterally; 2+ right femoral, posterior tibial and dorsalis pedis pulses; 2+ left femoral, posterior tibial and dorsalis pedis pulses; no subclavian or femoral bruits Neurological: grossly nonfocal LABS  CBC  Recent Labs  03/27/15 1825  03/29/15 0310 03/30/15 0323  WBC 14.9*  < > 10.1 6.5  NEUTROABS 12.5*  --   --   --   HGB 13.5  < > 11.8* 11.1*  HCT 41.4  < > 37.0 35.2*  MCV 91.4  < > 91.6 91.9  PLT 211  < > 189 198  < > = values in this interval not displayed. Basic Metabolic Panel  Recent Labs  03/29/15 0310 03/30/15 0323  NA 134* 138  K 3.8 3.5  CL 101 107  CO2 26 23  GLUCOSE 88 87  BUN 13 19  CREATININE 1.07* 1.14*  CALCIUM 8.0* 7.7*   Liver Function Tests  Recent Labs  03/27/15 1825 03/28/15 1012  AST  42* 40  ALT 30 27  ALKPHOS 54 47  BILITOT 0.9 0.9  PROT 7.5 6.1*  ALBUMIN 4.0 3.3*    Recent Labs  03/27/15 1825 03/28/15 1012  LIPASE 186* 104*  AMYLASE  --  134*   Cardiac Enzymes  Recent Labs  03/27/15 1840  CKTOTAL 52  CKMB 2.6  TROPONINI <0.03   BNP Invalid input(s): POCBNP D-Dimer No results for input(s): DDIMER in the last 72 hours. Hemoglobin A1C  Recent Labs  03/27/15 1840  HGBA1C 5.6   Fasting Lipid Panel  Recent Labs  03/27/15 1840  CHOL 134  HDL 51  LDLCALC 75  TRIG 38  CHOLHDL 2.6   Thyroid Function Tests No results for input(s): TSH, T4TOTAL, T3FREE, THYROIDAB in the last 72 hours.  Invalid input(s): [place]  Radiology Studies Imaging results have been reviewed and  Ct Abdomen Pelvis W Contrast  03/28/2015  CLINICAL DATA:  Generalized abdominal pain radiating to the epigastric area. Nausea. EXAM: CT ABDOMEN AND PELVIS WITH CONTRAST TECHNIQUE: Multidetector CT imaging of the abdomen and pelvis was performed using the standard protocol following bolus administration of intravenous contrast. CONTRAST:  13mL OMNIPAQUE IOHEXOL 300 MG/ML  SOLN COMPARISON:  CT 07/08/2012 FINDINGS: Lower chest: Small bilateral pleural effusions. Associated atelectasis the left lung base. There is oral contrast in the distal esophagus. Liver: No focal lesion. Hepatobiliary: Postcholecystectomy with stable intra and extrahepatic biliary prominence. No calcified choledocholithiasis. Pancreas: Generalized atrophy.  No surrounding inflammation. Spleen: No focal lesion.  Small in size. Adrenal glands: No nodule. Kidneys: Symmetric renal enhancement and excretion. No hydronephrosis. Two cysts in the right kidney are unchanged. Stomach/Bowel: Enteric tube in place, tip in the stomach. The stomach is distended with ingested contrast. Duodenum diverticulum is again seen without inflammation. Proximal small bowel loops are fluid-filled and distended with a transition point in the mid lower abdomen, axial images 61-64. Small bowel loops distal to this are decompressed. Small amount of adjacent free fluid and mesenteric edema. No pneumatosis. There is diverticulosis throughout the colon without diverticulitis. Enteric sutures noted in the sigmoid colon. No colonic inflammation. The appendix is not seen. Vascular/Lymphatic: No retroperitoneal adenopathy. Abdominal aorta is normal in caliber. Atherosclerosis of the abdominal aorta without aneurysm. Reproductive: The uterus is atrophic, normal for age ovaries are not visualized. There is no adnexal mass. Bladder: Distended with excreted intravenous contrast. Other: No free air. No intra-abdominal fluid collection. Scarring in the left lower quadrant abdominal wall,  likely prior colostomy site. Musculoskeletal: There are no acute or suspicious osseous abnormalities. Multilevel degenerative change throughout the lumbar spine. IMPRESSION: 1. Small bowel obstruction with transition point in the mid lower abdomen, likely due to adhesions. 2. Chronic findings include diverticulosis without diverticulitis, duodenum diverticulum, postcholecystectomy with stable biliary prominence, and right renal cysts. These results will be called to the ordering clinician or representative by the Radiologist Assistant, and communication documented in the PACS or zVision Dashboard. Electronically Signed   By: Jeb Levering M.D.   On: 03/28/2015 18:35   Dg Abd Portable 1v  03/30/2015  CLINICAL DATA:  Abdominal distention. EXAM: PORTABLE ABDOMEN - 1 VIEW COMPARISON:  One-view abdomen 03/29/2015. FINDINGS: The bowel gas pattern is normal. Contrast is progressing, now evident within the ascending and proximal transverse colon. Urinary contrast is present in the bladder. Surgical clips are stable. IMPRESSION: Progression of contrast into the ascending and proximal transverse colon. Electronically Signed   By: San Morelle M.D.   On: 03/30/2015 09:36   Dg Abd Portable  1v-small Bowel Obstruction Protocol-initial, 8 Hr Delay  03/29/2015  CLINICAL DATA:  Small-bowel obstruction, left-sided abdominal pain since Thursday. EXAM: PORTABLE ABDOMEN - 1 VIEW COMPARISON:  Abdomen plain film from earlier same day. FINDINGS: Nasogastric tube remains well positioned with tip in the expected region of the stomach pylorus/ proximal duodenum. Contrast material again noted within the bladder. Surgical clips again seen within the left pelvis. The dilated small bowel loops within the left upper quadrant are unchanged. No new area of bowel dilatation seen. No evidence of free intraperitoneal air seen. No abnormal fluid collection. Degenerative changes again noted within the lumbar spine. No acute osseous  abnormality seen. IMPRESSION: Persistently dilated small bowel loops within the left upper quadrant consistent with the given clinical data of small bowel obstruction. No new findings seen. Electronically Signed   By: Franki Cabot M.D.   On: 03/29/2015 18:58   Dg Abd Portable 1v  03/29/2015  CLINICAL DATA:  Small bowel obstruction, no bowel movement for 3 days, periumbilical pain, history diverticulosis EXAM: PORTABLE ABDOMEN - 1 VIEW COMPARISON:  Portable exam 0744 hours compared to CT abdomen and pelvis 03/28/2015 FINDINGS: Tip of nasogastric tube projects over distal gastric antrum Excreted contrast material within urinary bladder. Surgical clips LEFT upper quadrant and LEFT pelvis. Persisting gas distention of small bowel loops in the LEFT upper quadrant compatible with small bowel obstruction. No bowel wall thickening or or evidence of free intraperitoneal air on supine imaging. Degenerative disc and facet disease changes lumbar spine. IMPRESSION: Persistent dilatation of small bowel loops in the LEFT upper abdomen consistent with small bowel obstruction. Electronically Signed   By: Lavonia Dana M.D.   On: 03/29/2015 09:41   Dg Abd Portable 1v  03/28/2015  CLINICAL DATA:  NG tube placement. EXAM: PORTABLE ABDOMEN - 1 VIEW COMPARISON:  04/05/2015 at 14:02 p.m. FINDINGS: Nasogastric tube is present with tip within the stomach in the left upper quadrant and side-port just above the gastroesophageal junction. This could be advanced another 5 cm. Persistent gaseous distention of the stomach and small bowel loops in the left upper quadrant unchanged. Air and stool within the colon. Remainder the exam is unchanged. IMPRESSION: Gaseous distention of the stomach and mildly dilated air-filled small bowel loops in the left upper quadrant unchanged. Nasogastric tube with tip over the stomach and side-port just above the gastroesophageal junction. This could be advanced another 5 cm. Electronically Signed   By:  Marin Olp M.D.   On: 03/28/2015 15:21   Dg Abd Portable 1v  03/28/2015  CLINICAL DATA:  NG tube placement. EXAM: PORTABLE ABDOMEN - 1 VIEW COMPARISON:  03/28/2015 at 9:36 a.m. FINDINGS: Nasogastric tube courses approximately 3 cm to the left lateral aspect of the spine over the lower thorax and turns back medially with tip just left of the spine at the level of the diaphragm. This may be within the esophagus just above the gastroesophageal junction although cannot exclude location within the left lower lobe bronchi. Persistent gaseous distention of the stomach and a small bowel loops in the left upper abdomen. Remainder of the exam is unchanged. IMPRESSION: Nasogastric tube has tip at the level of the left hemidiaphragm just above the expected region of the gastroesophageal junction as this may be within the distal esophagus, although cannot exclude bronchial location. Persistent gaseous distention of the stomach and small bowel loops in the left upper quadrant without significant change. These results were called by telephone at the time of interpretation on 03/28/2015 at 2:17  pm to patient's nurse, Bobbe Medico, who verbally acknowledged these results. Electronically Signed   By: Marin Olp M.D.   On: 03/28/2015 14:18    TELE atrial fibrillation with borderline RVR   ASSESSMENT AND PLAN  1. Small bowel obstruction - improved, restart oral rate control meds  2. Hypertrophic Cardiomyopathy s/p remote septal myectomy: Nl EF by echo in January. Euvolemic on exam. Cont home meds.  3. Essential HTN  4. S/P bioprosthetic MVR, moderately elevated gradients (mean 7 on January echo) - may cause symptomatic mitral stenosis during periods of RVR  5. ST elevation - nonspecific in the setting of LBBB and prominent changes of LVH due to HCM. Normal wall motion, patent coronaries, negative troponin.  6. Permanent atrial fibrillation, rate controlled. No anticoagulation due to repeated GI  bleeding. If she develops rates> 90, start IV diltiazem  7. Left subclavian dual chamber PPM, programmed VVI for permanent AF   Sanda Klein, MD, [facility] [phone] office [phone] pager 03/30/2015 10:44 AM  Patient seen and examined  I agree with findings as noted by B Hager above.  Amended note.    Recomm continued meds for rate cntrol.  She is not a candidate for anticoagulation.   WIll be available as needed  Sign off for now.  Dorris Carnes

## 2015-03-31 ENCOUNTER — Inpatient Hospital Stay (HOSPITAL_COMMUNITY): Payer: Commercial Managed Care - HMO

## 2015-03-31 DIAGNOSIS — K565 Intestinal adhesions [bands] with obstruction (postprocedural) (postinfection): Principal | ICD-10-CM

## 2015-03-31 LAB — BASIC METABOLIC PANEL
ANION GAP: 5 (ref 5–15)
BUN: 17 mg/dL (ref 6–20)
CALCIUM: 8.4 mg/dL — AB (ref 8.9–10.3)
CO2: 27 mmol/L (ref 22–32)
CREATININE: 0.95 mg/dL (ref 0.44–1.00)
Chloride: 109 mmol/L (ref 101–111)
GFR, EST NON AFRICAN AMERICAN: 52 mL/min — AB (ref >60)
GLUCOSE: 129 mg/dL — AB (ref 65–99)
Potassium: 4.3 mmol/L (ref 3.5–5.1)
Sodium: 141 mmol/L (ref 135–145)

## 2015-03-31 LAB — CBC
HEMATOCRIT: 36.9 % (ref 36.0–46.0)
Hemoglobin: 11.6 g/dL — ABNORMAL LOW (ref 12.0–15.0)
MCH: 29 pg (ref 26.0–34.0)
MCHC: 31.4 g/dL (ref 30.0–36.0)
MCV: 92.3 fL (ref 78.0–100.0)
Platelets: 219 10*3/uL (ref 150–400)
RBC: 4 MIL/uL (ref 3.87–5.11)
RDW: 14.9 % (ref 11.5–15.5)
WBC: 3.5 10*3/uL — AB (ref 4.0–10.5)

## 2015-03-31 NOTE — Progress Notes (Signed)
    Subjective: Does not feel good.  + Nausea.    Objective: Vital signs in last 24 hours: Temp:  [98 F (36.7 C)-98.6 F (37 C)] 98.4 F (36.9 C) (10/24 0409) Pulse Rate:  [84-95] 84 (10/24 0409) Resp:  [16-19] 16 (10/24 0409) BP: (109-129)/(46-60) 114/46 mmHg (10/24 0409) SpO2:  [97 %-100 %] 97 % (10/24 0409) Last BM Date: 03/30/15  Intake/Output from previous day: 10/23 0701 - 10/24 0700 In: 1040 [P.O.:1040] Out: 350 [Urine:350] Intake/Output this shift:    Medications Scheduled Meds: . budesonide  0.25 mg Nebulization BID  . diltiazem  120 mg Oral Daily  . dorzolamide-timolol  1 drop Both Eyes BID  . enoxaparin (LOVENOX) injection  40 mg Subcutaneous Q24H  . metoprolol tartrate  50 mg Oral BID  . sodium chloride  3 mL Intravenous Q12H   Continuous Infusions:  PRN Meds:.sodium chloride, acetaminophen, albuterol, fluticasone, gi cocktail, lip balm, morphine injection, nitroGLYCERIN, ondansetron (ZOFRAN) IV, phenol, simethicone, sodium chloride  PE: General appearance: alert, cooperative and no distress Lungs: clear to auscultation bilaterally Heart: irregularly irregular rhythm Extremities: No LEE Pulses: 2+ and symmetric Skin: warm and dry Neurologic: Grossly normal  Lab Results:   Recent Labs  03/29/15 0310 03/30/15 0323  WBC 10.1 6.5  HGB 11.8* 11.1*  HCT 37.0 35.2*  PLT 189 198   BMET  Recent Labs  03/29/15 0310 03/30/15 0323  NA 134* 138  K 3.8 3.5  CL 101 107  CO2 26 23  GLUCOSE 88 87  BUN 13 19  CREATININE 1.07* 1.14*  CALCIUM 8.0* 7.7*      Assessment/Plan  Principal Problem:   Epigastric pain Active Problems:   Pacemaker-St.Jude   Chronic atrial fibrillation (HCC)   Warfarin anticoagulation   History of mitral valve replacement with bioprosthetic valve   IHSS (idiopathic hypertrophic subaortic stenosis) (HCC)   Chronic kidney disease (CKD), stage III (moderate)   Non-obstructive coronary artery disease   Essential  hypertension   Abnormal finding on EKG   Small bowel obstruction due to adhesions (HCC)   SBO (small bowel obstruction) ([place])  1. Small bowel obstruction - improved on abd xray.  + nausea this morning.   2. Hypertrophic Cardiomyopathy s/p remote septal myectomy: Nl EF by echo in January. Euvolemic on exam. Cont home meds.  3. Essential HTN: Controlled.  4. S/P bioprosthetic MVR, moderately elevated gradients (mean 7 on January echo) - may cause symptomatic mitral stenosis during periods of RVR  5. ST elevation - nonspecific in the setting of LBBB and prominent changes of LVH due to HCM. Normal wall motion, patent coronaries by cath 03/27/15, negative troponin.  6. Permanent atrial fibrillation, rate controlled on PO cardizem 120 and lopressor 50bid..  No anticoagulation due to repeated GI bleeding. If she develops rates> 90, start IV diltiazem  7. Left subclavian dual chamber PPM, programmed VVI for permanent AF    LOS: 4 days    HAGER, BRYAN PA-C 03/31/2015 8:05 AM  Pateient seen and examined (erroneous amendment to note yesterday by Jerilynn Mages Croitoru) Recomm continuing current meds for rate control  She is not an anticoag candidate Will be available as needed  Sign off for now  [employer]

## 2015-03-31 NOTE — Evaluation (Signed)
Physical Therapy Evaluation Patient Details Name: Jasmine [name] MRN: [id number] DOB: 1925/01/16 Today's Date: 03/31/2015   History of Present Illness  81 y/o female admitted with abd pain and found to have bowel obstruction with adhesions.  Pt also had cardiac cath.  PMH of CAD, CABG, A fib, CHF, pulmonary HTN  Clinical Impression  Pt admitted with above diagnosis. Pt currently with functional limitations due to the deficits listed below (see PT Problem List).  Pt will benefit from skilled PT to increase their independence and safety with mobility to allow discharge to the venue listed below.  Evaluation was limited by 9/10 abd pain today.  Pt moved well with SPT and requested use of RW, but typically does not use one.  Will continue to work with pt to determine if she will need RW and/or HHPT at time of d/c.     Follow Up Recommendations Home health PT (depending on progress)    Equipment Recommendations  Rolling walker with 5" wheels (depending on progress)    Recommendations for Other Services       Precautions / Restrictions Precautions Precautions: Fall Restrictions Weight Bearing Restrictions: No      Mobility  Bed Mobility Overal bed mobility: Modified Independent             General bed mobility comments: Pt able to come to EOB with bed flat with rail  Transfers Overall transfer level: Needs assistance Equipment used: Rolling walker (2 wheeled) Transfers: Sit to/from [facility] Sit to Stand: Supervision Stand pivot transfers: Supervision       General transfer comment: Cues for hand placement.  Pt transferred to recliner with RW and rested.  Encouraged pt to ambulate with PT, but she declined, but did need to get up to [facility].  Pt transferred with S with better hand placement from recliner to [facility].  Ambulation/Gait             General Gait Details: Pt declined ambulation due to 9/10 abd pain.  Stairs            Wheelchair  Mobility    Modified Rankin (Stroke Patients Only)       Balance Overall balance assessment: Needs assistance   Sitting balance-Leahy Scale: Good       Standing balance-Leahy Scale: Fair                               Pertinent Vitals/Pain Pain Assessment: 0-10 Pain Score: 9  Pain Location: abd, more on the left. Back Pain Descriptors / Indicators: Aching Pain Intervention(s): Patient requesting pain meds-RN notified;Repositioned;RN gave pain meds during session    Home Living Family/patient expects to be discharged to:: Private residence Living Arrangements: Children;Other relatives Available Help at Discharge: Available PRN/intermittently;Family Type of Home: House Home Access: Stairs to enter Entrance Stairs-Rails: Right Entrance Stairs-Number of Steps: 4 Home Layout: One level Home Equipment: Cane - single point;Shower seat Additional Comments: Lives with daughter and grandson who work.  Pt's brother lives with them, but pt is caregiver for him.    Prior Function Level of Independence: Independent               Hand Dominance   Dominant Hand: Right    Extremity/Trunk Assessment   Upper Extremity Assessment: Overall WFL for tasks assessed           Lower Extremity Assessment: Overall WFL for tasks assessed  Communication   Communication: No difficulties  Cognition Arousal/Alertness: Awake/alert Behavior During Therapy: WFL for tasks assessed/performed Overall Cognitive Status: Within Functional Limits for tasks assessed                      General Comments General comments (skin integrity, edema, etc.): Pt with good balance, but in obvious discomfort from 9/10 belly pain. Pt with some small burps. Nurse came in and gave IV pain meds while sitting EOB.    Exercises        Assessment/Plan    PT Assessment Patient needs continued PT services  PT Diagnosis Difficulty walking;Acute pain   PT Problem List  Decreased activity tolerance;Pain;Decreased balance;Decreased mobility;Decreased knowledge of use of DME  PT Treatment Interventions Gait training;Functional mobility training;Therapeutic activities;Stair training;Therapeutic exercise   PT Goals (Current goals can be found in the Care Plan section) Acute Rehab PT Goals Patient Stated Goal: decrease pain PT Goal Formulation: With patient Time For Goal Achievement: 04/07/15 Potential to Achieve Goals: Good    Frequency Min 3X/week   Barriers to discharge        Co-evaluation               End of Session Equipment Utilized During Treatment: Gait belt Activity Tolerance: Patient limited by pain Patient left: Other (comment);with call bell/phone within reach (on [facility]) Nurse Communication: Mobility status         Time: 5621-3086 PT Time Calculation (min) (ACUTE ONLY): 22 min   Charges:   PT Evaluation $Initial PT Evaluation Tier I: 1 Procedure     PT G Codes:        Jarvis Knodel LUBECK 03/31/2015, 10:03 AM

## 2015-03-31 NOTE — Progress Notes (Signed)
Patient ID: Jasmine [name], female   DOB: 06/16/1924, 81 y.o.   MRN: [id number]       [facility]      [address]   [place], [place] [address]    Phone: [phone] FAX: [phone]     Subjective: Nausea, no vomiting.  Pain.  Afebrile.  Normal WBC.   Objective:  Vital signs:  Filed Vitals:   03/30/15 1409 03/30/15 2043 03/30/15 2053 03/31/15 0409  BP: 109/60 129/46  114/46  Pulse: 95 87  84  Temp: 98.6 F (37 C) 98 F (36.7 C)  98.4 F (36.9 C)  TempSrc: Oral Oral  Oral  Resp: 19 18  16  Height:      Weight:      SpO2: 100% 98% 98% 97%    Last BM Date: 03/30/15  Intake/Output   Yesterday:  10/23 0701 - 10/24 0700 In: 1040 [P.O.:1040] Out: 350 [Urine:350] This shift:    I/O last 3 completed shifts: In: 2190 [P.O.:1040; I.V.:1150] Out: 1950 [Urine:750; Emesis/NG output:1200]    Physical Exam: General: Pt awake/alert/oriented x4 in no acute distress  Abdomen: Soft.  Nondistended.  Diffuse tenderness without guarding.  No evidence of peritonitis.  No incarcerated hernias.    Problem List:   Principal Problem:   Epigastric pain Active Problems:   Pacemaker-St.Jude   Chronic atrial fibrillation (HCC)   Warfarin anticoagulation   History of mitral valve replacement with bioprosthetic valve   IHSS (idiopathic hypertrophic subaortic stenosis) (HCC)   Chronic kidney disease (CKD), stage III (moderate)   Non-obstructive coronary artery disease   Essential hypertension   Abnormal finding on EKG   Small bowel obstruction due to adhesions (HCC)   SBO (small bowel obstruction) (HCC)    Results:   Labs: Results for orders placed or performed during the hospital encounter of 03/27/15 (from the past 48 hour(s))  CBC     Status: Abnormal   Collection Time: 03/30/15  3:23 AM  Result Value Ref Range   WBC 6.5 4.0 - 10.5 K/uL   RBC 3.83 (L) 3.87 - 5.11 MIL/uL   Hemoglobin 11.1 (L) 12.0 - 15.0 g/dL   HCT 35.2  (L) 36.0 - 46.0 %   MCV 91.9 78.0 - 100.0 fL   MCH 29.0 26.0 - 34.0 pg   MCHC 31.5 30.0 - 36.0 g/dL   RDW 15.1 11.5 - 15.5 %   Platelets 198 150 - 400 K/uL  Basic metabolic panel     Status: Abnormal   Collection Time: 03/30/15  3:23 AM  Result Value Ref Range   Sodium 138 135 - 145 mmol/L   Potassium 3.5 3.5 - 5.1 mmol/L   Chloride 107 101 - 111 mmol/L   CO2 23 22 - 32 mmol/L   Glucose, Bld 87 65 - 99 mg/dL   BUN 19 6 - 20 mg/dL   Creatinine, Ser 1.14 (H) 0.44 - 1.00 mg/dL   Calcium 7.7 (L) 8.9 - 10.3 mg/dL   GFR calc non Af Amer 42 (L) >60 mL/min   GFR calc Af Amer 48 (L) >60 mL/min    Comment: (NOTE) The eGFR has been calculated using the CKD EPI equation. This calculation has not been validated in all clinical situations. eGFR's persistently <60 mL/min signify possible Chronic Kidney Disease.    Anion gap 8 5 - 15  Magnesium     Status: Abnormal   Collection Time: 03/30/15 12:50 PM  Result Value Ref Range     Magnesium 2.5 (H) 1.7 - 2.4 mg/dL    Imaging / Studies: Dg Abd Portable 1v  03/30/2015  CLINICAL DATA:  Abdominal distention. EXAM: PORTABLE ABDOMEN - 1 VIEW COMPARISON:  One-view abdomen 03/29/2015. FINDINGS: The bowel gas pattern is normal. Contrast is progressing, now evident within the ascending and proximal transverse colon. Urinary contrast is present in the bladder. Surgical clips are stable. IMPRESSION: Progression of contrast into the ascending and proximal transverse colon. Electronically Signed   By: Christopher  Mattern M.D.   On: 03/30/2015 09:36   Dg Abd Portable 1v-small Bowel Obstruction Protocol-initial, 8 Hr Delay  03/29/2015  CLINICAL DATA:  Small-bowel obstruction, left-sided abdominal pain since Thursday. EXAM: PORTABLE ABDOMEN - 1 VIEW COMPARISON:  Abdomen plain film from earlier same day. FINDINGS: Nasogastric tube remains well positioned with tip in the expected region of the stomach pylorus/ proximal duodenum. Contrast material again noted within  the bladder. Surgical clips again seen within the left pelvis. The dilated small bowel loops within the left upper quadrant are unchanged. No new area of bowel dilatation seen. No evidence of free intraperitoneal air seen. No abnormal fluid collection. Degenerative changes again noted within the lumbar spine. No acute osseous abnormality seen. IMPRESSION: Persistently dilated small bowel loops within the left upper quadrant consistent with the given clinical data of small bowel obstruction. No new findings seen. Electronically Signed   By: Stan  Maynard M.D.   On: 03/29/2015 18:58    Medications / Allergies:  Scheduled Meds: . budesonide  0.25 mg Nebulization BID  . diltiazem  120 mg Oral Daily  . dorzolamide-timolol  1 drop Both Eyes BID  . enoxaparin (LOVENOX) injection  40 mg Subcutaneous Q24H  . metoprolol tartrate  50 mg Oral BID  . sodium chloride  3 mL Intravenous Q12H   Continuous Infusions:  PRN Meds:.sodium chloride, acetaminophen, albuterol, fluticasone, gi cocktail, lip balm, morphine injection, nitroGLYCERIN, ondansetron (ZOFRAN) IV, phenol, simethicone, sodium chloride  Antibiotics: Anti-infectives    None        Assessment/Plan SBO-AXR with normal bowel gas pattern, contrast within the colon, abdomen is soft.  Non surgical abdomen.  On fulls, but complaining of pain and nausea. Management per primary team.     , ANP-BC [facility] Pager [phone](7A-4:30P) For consults and floor pages call [phone](7A-4:30P)  03/31/2015 8:18 AM    

## 2015-03-31 NOTE — Progress Notes (Signed)
Attempted to walk patient in hallway, she refused to come any further than her room, walked with daughter around room then back to bed.  Mervyn Skeeters, RN

## 2015-03-31 NOTE — Progress Notes (Signed)
PROGRESS NOTE  Jasmine [name] NOM:[id number] DOB: [birth year] DOA: 03/27/2015 PCP: [name], MD  81yo female with pmh of HCM, stage III CKD, persistant afib not on anticoagulation secondary to hx of GI bleed, pacemaker, GERD. Pt presented to ED on 03/27/15 with complaints of abdominal and epigastric pain since Wednesday evening. Pt noted to have inferior ST elevation on EKG done in ED. She was admitted by cardiology and underwent cardiac cath last evening. Cath showed normal CAD and normal systolic function despite abnormal ekg. [facility] has been asked to consult to further evaluate pt's abdominal pain.  Found to have SBO- NG Tube placed and removed after SBO resolved   Assessment/Plan: Abdominal/epigastric pain:  Sbo- due to adhesions- resolved, NG tube removed -small bowel protocol per surgery 10/22 -full liquids-- surgery following- diminished bowel sounds today- check x ray  Abnormal EKG -s/p cath 03/27/15. Coronaries wildely patent with preserved EF.  -No further w/u planned  Hypertension, uncontrolled -improved now. Will continue to monitor   CKD, stage III -stable. Baseline appears to be ~1-1.2  Chronic afib -resume PO rate controlling meds- cardizem and BB- with holding parameter -no anticoag with hx GIB  Code Status: full Family Communication: patient/family at bedside Disposition Plan:    Consultants:  Cards  surgery  Procedures:      HPI/Subjective: Feels like obstruction is happening again Does not want to eat  Objective: Filed Vitals:   03/31/15 1016  BP: 145/72  Pulse: 88  Temp:   Resp:     Intake/Output Summary (Last 24 hours) at 03/31/15 1237 Last data filed at 03/31/15 0805  Gross per 24 hour  Intake    800 ml  Output    350 ml  Net    450 ml   Filed Weights   03/27/15 2000 03/28/15 0200 03/28/15 1633  Weight: 71.8 kg (158 lb 4.6 oz) 72.4 kg (159 lb 9.8 oz) 74.6 kg (164 lb 7.4 oz)    Exam:   General:  Awake,  NAD  Cardiovascular: irr  Respiratory: clear, no increase work of breathing  Abdomen: still some tenderness, diminished bowel sounds  Musculoskeletal: no edema   Data Reviewed: Basic Metabolic Panel:  Recent Labs Lab 03/27/15 1825 03/27/15 1858 03/28/15 0320 03/29/15 0310 03/30/15 0323 03/30/15 1250 03/31/15 0238  NA 138 140 139 134* 138  --  141  K 4.5 3.8 4.3 3.8 3.5  --  4.3  CL 102 102 104 101 107  --  109  CO2 27  --  26 26 23   --  27  GLUCOSE 122* 129* 127* 88 87  --  129*  BUN 7 9 6 13 19   --  17  CREATININE 1.09* 0.90 0.91 1.07* 1.14*  --  0.95  CALCIUM 9.7  --  8.9 8.0* 7.7*  --  8.4*  MG  --   --   --   --   --  2.5*  --    Liver Function Tests:  Recent Labs Lab 03/27/15 1825 03/28/15 1012  AST 42* 40  ALT 30 27  ALKPHOS 54 47  BILITOT 0.9 0.9  PROT 7.5 6.1*  ALBUMIN 4.0 3.3*    Recent Labs Lab 03/27/15 1825 03/28/15 1012  LIPASE 186* 104*  AMYLASE  --  134*   No results for input(s): AMMONIA in the last 168 hours. CBC:  Recent Labs Lab 03/27/15 1825 03/27/15 1858 03/28/15 0320 03/29/15 0310 03/30/15 0323 03/31/15 0238  WBC 14.9*  --  11.5* 10.1  6.5 3.5*  NEUTROABS 12.5*  --   --   --   --   --   HGB 13.5 13.6 12.4 11.8* 11.1* 11.6*  HCT 41.4 40.0 38.2 37.0 35.2* 36.9  MCV 91.4  --  90.7 91.6 91.9 92.3  PLT 211  --  189 189 198 219   Cardiac Enzymes:  Recent Labs Lab 03/27/15 1840  CKTOTAL 52  CKMB 2.6  TROPONINI <0.03   BNP (last 3 results)  Recent Labs  06/10/14 1952 06/23/14 0008  BNP 143.5* 430.5*    ProBNP (last 3 results) No results for input(s): PROBNP in the last 8760 hours.  CBG: No results for input(s): GLUCAP in the last 168 hours.  Recent Results (from the past 240 hour(s))  MRSA PCR Screening     Status: None   Collection Time: 03/27/15  8:00 PM  Result Value Ref Range Status   MRSA by PCR NEGATIVE NEGATIVE Final    Comment:        The GeneXpert MRSA Assay (FDA approved for NASAL  specimens only), is one component of a comprehensive MRSA colonization surveillance program. It is not intended to diagnose MRSA infection nor to guide or monitor treatment for MRSA infections.      Studies: Dg Abd Portable 1v  03/30/2015  CLINICAL DATA:  Abdominal distention. EXAM: PORTABLE ABDOMEN - 1 VIEW COMPARISON:  One-view abdomen 03/29/2015. FINDINGS: The bowel gas pattern is normal. Contrast is progressing, now evident within the ascending and proximal transverse colon. Urinary contrast is present in the bladder. Surgical clips are stable. IMPRESSION: Progression of contrast into the ascending and proximal transverse colon. Electronically Signed   By: San Morelle M.D.   On: 03/30/2015 09:36   Dg Abd Portable 1v-small Bowel Obstruction Protocol-initial, 8 Hr Delay  03/29/2015  CLINICAL DATA:  Small-bowel obstruction, left-sided abdominal pain since Thursday. EXAM: PORTABLE ABDOMEN - 1 VIEW COMPARISON:  Abdomen plain film from earlier same day. FINDINGS: Nasogastric tube remains well positioned with tip in the expected region of the stomach pylorus/ proximal duodenum. Contrast material again noted within the bladder. Surgical clips again seen within the left pelvis. The dilated small bowel loops within the left upper quadrant are unchanged. No new area of bowel dilatation seen. No evidence of free intraperitoneal air seen. No abnormal fluid collection. Degenerative changes again noted within the lumbar spine. No acute osseous abnormality seen. IMPRESSION: Persistently dilated small bowel loops within the left upper quadrant consistent with the given clinical data of small bowel obstruction. No new findings seen. Electronically Signed   By: Franki Cabot M.D.   On: 03/29/2015 18:58    Scheduled Meds: . budesonide  0.25 mg Nebulization BID  . diltiazem  120 mg Oral Daily  . dorzolamide-timolol  1 drop Both Eyes BID  . enoxaparin (LOVENOX) injection  40 mg Subcutaneous Q24H  .  metoprolol tartrate  50 mg Oral BID  . sodium chloride  3 mL Intravenous Q12H   Continuous Infusions:   Antibiotics Given (last 72 hours)    None      Principal Problem:   Epigastric pain Active Problems:   Pacemaker-St.Jude   Chronic atrial fibrillation (HCC)   Warfarin anticoagulation   History of mitral valve replacement with bioprosthetic valve   IHSS (idiopathic hypertrophic subaortic stenosis) (HCC)   Chronic kidney disease (CKD), stage III (moderate)   Non-obstructive coronary artery disease   Essential hypertension   Abnormal finding on EKG   Small bowel obstruction due to adhesions (  [place])   SBO (small bowel obstruction) ([place])    Time spent: 25 min    Oreste Majeed  [facility] Pager [phone]. If 7PM-7AM, please contact night-coverage at [email or web address], password [facility] 03/31/2015, 12:37 PM  LOS: 4 days

## 2015-03-31 NOTE — Progress Notes (Signed)
Utilization review completed.  

## 2015-03-31 NOTE — Care Management Important Message (Signed)
Important Message  Patient Details  Name: Jasmine [name] MRN: [id number] Date of Birth: 01/29/1925   Medicare Important Message Given:  Yes-second notification given    Nathen May 03/31/2015, 10:30 AM

## 2015-04-01 LAB — CBC
HCT: 37.4 % (ref 36.0–46.0)
Hemoglobin: 12 g/dL (ref 12.0–15.0)
MCH: 29.6 pg (ref 26.0–34.0)
MCHC: 32.1 g/dL (ref 30.0–36.0)
MCV: 92.1 fL (ref 78.0–100.0)
Platelets: 210 10*3/uL (ref 150–400)
RBC: 4.06 MIL/uL (ref 3.87–5.11)
RDW: 14.9 % (ref 11.5–15.5)
WBC: 3.7 10*3/uL — AB (ref 4.0–10.5)

## 2015-04-01 MED ORDER — FAMOTIDINE 20 MG PO TABS
20.0000 mg | ORAL_TABLET | Freq: Every day | ORAL | Status: DC
  Administered 2015-04-01 – 2015-04-02 (×2): 20 mg via ORAL
  Filled 2015-04-01: qty 1

## 2015-04-01 NOTE — Progress Notes (Signed)
Physical Therapy Treatment Patient Details Name: HILLIARY JOCK MRN: [id number] DOB: Jul 27, 1924 Today's Date: 04/01/2015    History of Present Illness 81 y/o female admitted with abd pain and found to have bowel obstruction with adhesions.  Pt also had cardiac cath.  PMH of CAD, CABG, A fib, CHF, pulmonary HTN    PT Comments    Patient feeling much better today. Tolerated gait training with and without RW. Balance and gait speed improved with use of RW. Encouraged RW for ambulation until strength/mobility improves. Pt agreeable. Reports dyspnea on exertion. Declined stair training next session as tolerated. Will follow acutely per current POC.   Follow Up Recommendations  Home health PT     Equipment Recommendations  Rolling walker with 5" wheels    Recommendations for Other Services       Precautions / Restrictions Precautions Precautions: Fall Restrictions Weight Bearing Restrictions: No    Mobility  Bed Mobility Overal bed mobility: Needs Assistance Bed Mobility: Sit to Supine       Sit to supine: Modified independent (Device/Increase time)   General bed mobility comments: HOB flat, no use  of rail.  Transfers Overall transfer level: Needs assistance Equipment used: None Transfers: Sit to/from Stand Sit to Stand: Supervision         General transfer comment: Supervision for safety. Stood from [facility] x1, from chair x1.   Ambulation/Gait Ambulation/Gait assistance: Min guard;Supervision Ambulation Distance (Feet): 50 Feet (+ 100') Assistive device: Rolling walker (2 wheeled);None Gait Pattern/deviations: Step-through pattern;Decreased stride length;Drifts right/left;Staggering right;Staggering left;Trunk flexed   Gait velocity interpretation: <1.8 ft/sec, indicative of risk for recurrent falls General Gait Details: Ambulated without RW-drifting and staggering noted but no overt LOB. Long seated rest break. Ambulated x2 with RW- more stable. HR ranged from  90s-114 bpm.   Stairs            Wheelchair Mobility    Modified Rankin (Stroke Patients Only)       Balance Overall balance assessment: Needs assistance Sitting-balance support: Feet supported;No upper extremity supported Sitting balance-Leahy Scale: Good     Standing balance support: During functional activity Standing balance-Leahy Scale: Fair Standing balance comment: Able to perform pericare wtihout difficulty in standing reaching outside BoS.                    Cognition Arousal/Alertness: Awake/alert Behavior During Therapy: WFL for tasks assessed/performed Overall Cognitive Status: Within Functional Limits for tasks assessed                      Exercises      General Comments        Pertinent Vitals/Pain Pain Assessment: No/denies pain    Home Living                      Prior Function            PT Goals (current goals can now be found in the care plan section) Progress towards PT goals: Progressing toward goals    Frequency  Min 3X/week    PT Plan Current plan remains appropriate    Co-evaluation             End of Session Equipment Utilized During Treatment: Gait belt Activity Tolerance: Patient tolerated treatment well Patient left: in bed;with call bell/phone within reach;with bed alarm set     Time: 1011-1030 PT Time Calculation (min) (ACUTE ONLY): 19 min  Charges:  $Gait Training: 8-22  mins                    G Codes:      Lacie Draft 04/01/2015, 10:51 AM Wray Kearns, PT, DPT [phone]

## 2015-04-01 NOTE — Progress Notes (Signed)
Patient ate about 20% of dinner and reports pain in abdomen after meal.  Pain medicine administered and will continue to monitor.

## 2015-04-01 NOTE — Care Management Note (Signed)
Case Management Note Marvetta Gibbons RN, BSN Unit 2W-Case Manager [phone]  Patient Details  Name: Jasmine [name] MRN: [id number] Date of Birth: 29-Jan-1925  Subjective/Objective:  Pt admitted with abd pain- SBO                  Action/Plan: PTA pt lived at home-- plan to return home with [facility] per recommendations- orders in for HH-PT- spoke with pt at bedside- offered choice for [facility] agency of [facility]- per pt choice- [facility] selected by pt- referral called to Santiago Glad with [facility] for HH-PT- also spoke with Brenton Grills with [facility] for DME need- RW- which will be delivered to room prior to discharge  Expected Discharge Date:                  Expected Discharge Plan:  [place]  In-House Referral:     Discharge planning Services  CM Consult  Post Acute Care Choice:  Home Health Choice offered to:  Patient  DME Arranged:    DME Agency:     HH Arranged:  PT HH Agency:  [place]  Status of Service:  Completed, signed off  Medicare Important Message Given:  Yes-second notification given Date Medicare IM Given:    Medicare IM give by:    Date Additional Medicare IM Given:    Additional Medicare Important Message give by:     If discussed at [place] of Stay Meetings, dates discussed:  04/01/15  Additional Comments:  Dawayne Patricia, RN 04/01/2015, 11:33 AM

## 2015-04-01 NOTE — Progress Notes (Signed)
Patient ID: Jasmine [name], female   DOB: 1924-08-19, 81 y.o.   MRN: [id number] 5 Days Post-Op  Subjective: Feels much better today. Denies abdominal pain.  Moving her bowels a lot.  Tolerating full liquids well.  Objective: Vital signs in last 24 hours: Temp:  [98.3 F (36.8 C)-98.5 F (36.9 C)] 98.3 F (36.8 C) (10/25 0458) Pulse Rate:  [77-88] 79 (10/25 0458) Resp:  [18-20] 18 (10/25 0458) BP: (128-145)/(50-72) 128/50 mmHg (10/25 0458) SpO2:  [97 %-99 %] 98 % (10/25 0458) Last BM Date: 03/30/15  Intake/Output from previous day: 10/24 0701 - 10/25 0700 In: 130 [P.O.:130] Out: 200 [Urine:200] Intake/Output this shift:    PE: Abd: soft, minimally tender, +BS, ND  Lab Results:   Recent Labs  03/31/15 0238 04/01/15 0354  WBC 3.5* 3.7*  HGB 11.6* 12.0  HCT 36.9 37.4  PLT 219 210   BMET  Recent Labs  03/30/15 0323 03/31/15 0238  NA 138 141  K 3.5 4.3  CL 107 109  CO2 23 27  GLUCOSE 87 129*  BUN 19 17  CREATININE 1.14* 0.95  CALCIUM 7.7* 8.4*   PT/INR No results for input(s): LABPROT, INR in the last 72 hours. CMP     Component Value Date/Time   NA 141 03/31/2015 0238   K 4.3 03/31/2015 0238   CL 109 03/31/2015 0238   CO2 27 03/31/2015 0238   GLUCOSE 129* 03/31/2015 0238   BUN 17 03/31/2015 0238   CREATININE 0.95 03/31/2015 0238   CALCIUM 8.4* 03/31/2015 0238   PROT 6.1* 03/28/2015 1012   ALBUMIN 3.3* 03/28/2015 1012   AST 40 03/28/2015 1012   ALT 27 03/28/2015 1012   ALKPHOS 47 03/28/2015 1012   BILITOT 0.9 03/28/2015 1012   GFRNONAA 52* 03/31/2015 0238   GFRAA >60 03/31/2015 0238   Lipase     Component Value Date/Time   LIPASE 104* 03/28/2015 1012       Studies/Results: Dg Abd Portable 1v  03/31/2015  CLINICAL DATA:  81 year old female with acute generalized abdominal pain and nausea today. EXAM: PORTABLE ABDOMEN - 1 VIEW COMPARISON:  03/30/2015 and prior radiographs FINDINGS: A few mildly distended small bowel loops within the left  abdomen are nonspecific. There is little gas identified within the colon. Surgical clips overlying the left abdomen and pelvis are present. IMPRESSION: Nonspecific mildly distended small bowel loops within the left abdomen. This may represent a focal ileus or early bowel obstruction. Electronically Signed   By: Margarette Canada M.D.   On: 03/31/2015 13:53    Anti-infectives: Anti-infectives    None       Assessment/Plan  1. Sbo, resolving -advance to soft diet -stable for DC home from our standpoint when she can tolerate a soft diet. -no surgical follow up needed after DC from our standpoint.   LOS: 5 days    Norvell Ureste E 04/01/2015, 8:21 AM Pager: [phone]

## 2015-04-01 NOTE — Progress Notes (Signed)
PROGRESS NOTE  Jasmine [name] FTD:[id number] DOB: 26-Aug-1924 DOA: 03/27/2015 PCP: [name], MD  81yo female with pmh of HCM, stage III CKD, persistant afib not on anticoagulation secondary to hx of GI bleed, pacemaker, GERD. Pt presented to ED on 03/27/15 with complaints of abdominal and epigastric pain since Wednesday evening. Pt noted to have inferior ST elevation on EKG done in ED. She was admitted by cardiology and underwent cardiac cath last evening. Cath showed normal CAD and normal systolic function despite abnormal ekg. [facility] has been asked to consult to further evaluate pt's abdominal pain.  Found to have SBO- NG Tube placed and removed after SBO resolved   Assessment/Plan: Abdominal/epigastric pain:  Sbo- due to adhesions- resolved, NG tube removed -small bowel protocol per surgery 10/22 -advance diet to soft and watch how she tolerates  Abnormal EKG -s/p cath 03/27/15. Coronaries wildely patent with preserved EF.  -No further w/u planned  Hypertension, uncontrolled -improved now. Will continue to monitor  CKD, stage III -stable. Baseline appears to be ~1-1.2  Chronic afib -resume PO rate controlling meds- cardizem and BB- with holding parameter -no anticoag with hx GIB  Code Status: full Family Communication: called daughter Disposition Plan:  D/c in AM   Consultants:  Cards  surgery  Procedures:      HPI/Subjective: Some pain with eating lunch (softs)  Objective: Filed Vitals:   04/01/15 1353  BP: 110/64  Pulse: 77  Temp: 98.5 F (36.9 C)  Resp: 18    Intake/Output Summary (Last 24 hours) at 04/01/15 1411 Last data filed at 04/01/15 1300  Gross per 24 hour  Intake    460 ml  Output      0 ml  Net    460 ml   Filed Weights   03/27/15 2000 03/28/15 0200 03/28/15 1633  Weight: 71.8 kg (158 lb 4.6 oz) 72.4 kg (159 lb 9.8 oz) 74.6 kg (164 lb 7.4 oz)    Exam:   General:  Awake, NAD  Cardiovascular:  irr  Respiratory: clear, no increase work of breathing  Abdomen: still some tenderness, diminished bowel sounds  Musculoskeletal: no edema   Data Reviewed: Basic Metabolic Panel:  Recent Labs Lab 03/27/15 1825 03/27/15 1858 03/28/15 0320 03/29/15 0310 03/30/15 0323 03/30/15 1250 03/31/15 0238  NA 138 140 139 134* 138  --  141  K 4.5 3.8 4.3 3.8 3.5  --  4.3  CL 102 102 104 101 107  --  109  CO2 27  --  26 26 23   --  27  GLUCOSE 122* 129* 127* 88 87  --  129*  BUN 7 9 6 13 19   --  17  CREATININE 1.09* 0.90 0.91 1.07* 1.14*  --  0.95  CALCIUM 9.7  --  8.9 8.0* 7.7*  --  8.4*  MG  --   --   --   --   --  2.5*  --    Liver Function Tests:  Recent Labs Lab 03/27/15 1825 03/28/15 1012  AST 42* 40  ALT 30 27  ALKPHOS 54 47  BILITOT 0.9 0.9  PROT 7.5 6.1*  ALBUMIN 4.0 3.3*    Recent Labs Lab 03/27/15 1825 03/28/15 1012  LIPASE 186* 104*  AMYLASE  --  134*   No results for input(s): AMMONIA in the last 168 hours. CBC:  Recent Labs Lab 03/27/15 1825  03/28/15 0320 03/29/15 0310 03/30/15 0323 03/31/15 0238 04/01/15 0354  WBC 14.9*  --  11.5* 10.1  6.5 3.5* 3.7*  NEUTROABS 12.5*  --   --   --   --   --   --   HGB 13.5  < > 12.4 11.8* 11.1* 11.6* 12.0  HCT 41.4  < > 38.2 37.0 35.2* 36.9 37.4  MCV 91.4  --  90.7 91.6 91.9 92.3 92.1  PLT 211  --  189 189 198 219 210  < > = values in this interval not displayed. Cardiac Enzymes:  Recent Labs Lab 03/27/15 1840  CKTOTAL 52  CKMB 2.6  TROPONINI <0.03   BNP (last 3 results)  Recent Labs  06/10/14 1952 06/23/14 0008  BNP 143.5* 430.5*    ProBNP (last 3 results) No results for input(s): PROBNP in the last 8760 hours.  CBG: No results for input(s): GLUCAP in the last 168 hours.  Recent Results (from the past 240 hour(s))  MRSA PCR Screening     Status: None   Collection Time: 03/27/15  8:00 PM  Result Value Ref Range Status   MRSA by PCR NEGATIVE NEGATIVE Final    Comment:        The  GeneXpert MRSA Assay (FDA approved for NASAL specimens only), is one component of a comprehensive MRSA colonization surveillance program. It is not intended to diagnose MRSA infection nor to guide or monitor treatment for MRSA infections.      Studies: Dg Abd Portable 1v  03/31/2015  CLINICAL DATA:  81 year old female with acute generalized abdominal pain and nausea today. EXAM: PORTABLE ABDOMEN - 1 VIEW COMPARISON:  03/30/2015 and prior radiographs FINDINGS: A few mildly distended small bowel loops within the left abdomen are nonspecific. There is little gas identified within the colon. Surgical clips overlying the left abdomen and pelvis are present. IMPRESSION: Nonspecific mildly distended small bowel loops within the left abdomen. This may represent a focal ileus or early bowel obstruction. Electronically Signed   By: Margarette Canada M.D.   On: 03/31/2015 13:53    Scheduled Meds: . budesonide  0.25 mg Nebulization BID  . diltiazem  120 mg Oral Daily  . dorzolamide-timolol  1 drop Both Eyes BID  . enoxaparin (LOVENOX) injection  40 mg Subcutaneous Q24H  . metoprolol tartrate  50 mg Oral BID  . sodium chloride  3 mL Intravenous Q12H   Continuous Infusions:   Antibiotics Given (last 72 hours)    None      Principal Problem:   Epigastric pain Active Problems:   Pacemaker-[employer]   Chronic atrial fibrillation (HCC)   Warfarin anticoagulation   History of mitral valve replacement with bioprosthetic valve   IHSS (idiopathic hypertrophic subaortic stenosis) (HCC)   Chronic kidney disease (CKD), stage III (moderate)   Non-obstructive coronary artery disease   Essential hypertension   Abnormal finding on EKG   Small bowel obstruction due to adhesions ([place])   SBO (small bowel obstruction) ([place])    Time spent: 25 min    Sione Baumgarten  [facility] Pager [phone]. If 7PM-7AM, please contact night-coverage at [email or web address], password [facility] 04/01/2015, 2:11 PM  LOS: 5  days

## 2015-04-02 LAB — CBC
HCT: 36.1 % (ref 36.0–46.0)
HEMOGLOBIN: 11.4 g/dL — AB (ref 12.0–15.0)
MCH: 29.2 pg (ref 26.0–34.0)
MCHC: 31.6 g/dL (ref 30.0–36.0)
MCV: 92.6 fL (ref 78.0–100.0)
PLATELETS: 245 10*3/uL (ref 150–400)
RBC: 3.9 MIL/uL (ref 3.87–5.11)
RDW: 14.9 % (ref 11.5–15.5)
WBC: 4.2 10*3/uL (ref 4.0–10.5)

## 2015-04-02 MED ORDER — ONDANSETRON HCL 4 MG PO TABS: 4.0000 mg | ORAL_TABLET | Freq: Three times a day (TID) | ORAL | Status: AC | PRN

## 2015-04-02 MED ORDER — METOPROLOL TARTRATE 50 MG PO TABS: 50.0000 mg | ORAL_TABLET | Freq: Two times a day (BID) | ORAL | Status: AC

## 2015-04-02 MED ORDER — DILTIAZEM HCL ER COATED BEADS 120 MG PO CP24: 120.0000 mg | ORAL_CAPSULE | Freq: Every day | ORAL | Status: AC

## 2015-04-02 NOTE — Progress Notes (Signed)
Physical Therapy Treatment Patient Details Name: Jasmine [name] MRN: [id number] DOB: [birth year] Today's Date: 04/02/2015    History of Present Illness 81 y/o female admitted with abd pain and found to have bowel obstruction with adhesions.  Pt also had cardiac cath.  PMH of CAD, CABG, A fib, CHF, pulmonary HTN    PT Comments    Patient not feeling well today, reported feeling tired and weak. Requires long seated rest break btw ambulation bouts. Requires Min guard assist for gait training today with due bil knee instability and reports of feeling like they are going to buckle. Declines stair training due to above. Will follow acutely per current POC to maximize independence and mobility.   Follow Up Recommendations  Home health PT     Equipment Recommendations  Rolling walker with 5" wheels    Recommendations for Other Services       Precautions / Restrictions Precautions Precautions: Fall Restrictions Weight Bearing Restrictions: No    Mobility  Bed Mobility Overal bed mobility: Needs Assistance Bed Mobility: Sit to Supine       Sit to supine: Modified independent (Device/Increase time)   General bed mobility comments: HOB flat, no use  of rail.  Transfers Overall transfer level: Needs assistance Equipment used: Rolling walker (2 wheeled) Transfers: Sit to/from Stand Sit to Stand: Supervision         General transfer comment: Supervision for safety. Stood from [facility] x1, from chair x2.   Ambulation/Gait Ambulation/Gait assistance: Min guard Ambulation Distance (Feet): 100 Feet (+ 40') Assistive device: Rolling walker (2 wheeled) Gait Pattern/deviations: Step-through pattern;Decreased stride length;Trunk flexed   Gait velocity interpretation: <1.8 ft/sec, indicative of risk for recurrent falls General Gait Details: SLow, mildly unsteady gait. pt reporting, "my knees feel really weak, stay close to me." HR ranged from 85-110 bpm in A-fib. 1 long seated rest break  due to fatigue. Ambulated x2.   Stairs            Wheelchair Mobility    Modified Rankin (Stroke Patients Only)       Balance Overall balance assessment: Needs assistance Sitting-balance support: Feet supported;No upper extremity supported Sitting balance-Leahy Scale: Good     Standing balance support: During functional activity Standing balance-Leahy Scale: Fair Standing balance comment: Tolerated pericare without difficulty in standing reaching outside BoS.                    Cognition Arousal/Alertness: Awake/alert Behavior During Therapy: WFL for tasks assessed/performed Overall Cognitive Status: Within Functional Limits for tasks assessed                      Exercises      General Comments        Pertinent Vitals/Pain Pain Assessment: No/denies pain ("i feel really tired and weak.")    Home Living                      Prior Function            PT Goals (current goals can now be found in the care plan section) Progress towards PT goals: Progressing toward goals (slowly)    Frequency  Min 3X/week    PT Plan Current plan remains appropriate    Co-evaluation             End of Session Equipment Utilized During Treatment: Gait belt Activity Tolerance: Patient tolerated treatment well Patient left: in bed;with call bell/phone within reach;with  bed alarm set     Time: 1107-1130 PT Time Calculation (min) (ACUTE ONLY): 23 min  Charges:  $Gait Training: 23-37 mins                    G Codes:      Jourdin Gens A Kirklin Mcduffee 04/02/2015, 11:33 AM  Wray Kearns, PT, DPT [phone]

## 2015-04-02 NOTE — Discharge Summary (Signed)
Physician Discharge Summary  Jasmine [name] DPO:[id number] DOB: 21-Jun-1924 DOA: 03/27/2015  PCP: [name], MD  Admit date: 03/27/2015 Discharge date: 04/02/2015  Time spent: 35 minutes  Recommendations for Outpatient Follow-up:  Outpatient GI follow up Needs GOC conversatation with patient and family for FTT Aspiration precautions Home health  Discharge Diagnoses:  Principal Problem:   Epigastric pain Active Problems:   Pacemaker-[employer]   Chronic atrial fibrillation (HCC)   Warfarin anticoagulation   History of mitral valve replacement with bioprosthetic valve   IHSS (idiopathic hypertrophic subaortic stenosis) (HCC)   Chronic kidney disease (CKD), stage III (moderate)   Non-obstructive coronary artery disease   Essential hypertension   Abnormal finding on EKG   Small bowel obstruction due to adhesions [facility])   SBO (small bowel obstruction) ([place])   Discharge Condition: improved  Diet recommendation: as tolerated  Filed Weights   03/27/15 2000 03/28/15 0200 03/28/15 1633  Weight: 71.8 kg (158 lb 4.6 oz) 72.4 kg (159 lb 9.8 oz) 74.6 kg (164 lb 7.4 oz)    History of present illness:  81 y/o female with a h/o HCM, MVR, persistent AF/Fl, and c/p s/p nonobs cath in 06/2014, who presented to the ED today w/ complaints of c/p and was noted to have inferior ST elevation.  Hospital Course:  Abdominal/epigastric pain: Sbo- due to adhesions- resolved, NG tube removed -small bowel protocol per surgery 10/22 -tolerating diet Has c/o dysphagia that I spoke with Dr. Collene Mares regarding and those are long standing issues-- negative EGD recentyl  Abnormal EKG -s/p cath 03/27/15. Coronaries wildely patent with preserved EF.  -No further w/u planned  Hypertension, uncontrolled -improved now. Will continue to monitor  CKD, stage III -stable. Baseline appears to be ~1-1.2  Chronic afib -resume PO rate controlling meds- cardizem and BB- with holding parameter -no anticoag  with hx GIB   Procedures:  cath  Consultations:  Cards  surgery  Discharge Exam: Filed Vitals:   04/02/15 0936  BP: 135/66  Pulse:   Temp:   Resp:     General: awake, NAD +BS, soft   Discharge Instructions   Discharge Instructions    Discharge instructions    Complete by:  As directed   Home health Would speak with PCP regarding code status and hospice for dysphagia/FTT Diet as tolerated-- may do better with liquids Increase activity     Increase activity slowly    Complete by:  As directed           Current Discharge Medication List    START taking these medications   Details  diltiazem (CARDIZEM CD) 120 MG 24 hr capsule Take 1 capsule (120 mg total) by mouth daily. Qty: 30 capsule, Refills: 0    metoprolol (LOPRESSOR) 50 MG tablet Take 1 tablet (50 mg total) by mouth 2 (two) times daily. Qty: 60 tablet, Refills: 0    ondansetron (ZOFRAN) 4 MG tablet Take 1 tablet (4 mg total) by mouth every 8 (eight) hours as needed for nausea or vomiting. Qty: 20 tablet, Refills: 0      CONTINUE these medications which have NOT CHANGED   Details  acetaminophen (TYLENOL) 500 MG tablet Take 500 mg by mouth every 6 (six) hours as needed. For pain    albuterol (PROVENTIL HFA;VENTOLIN HFA) 108 (90 BASE) MCG/ACT inhaler Inhale 2 puffs into the lungs every 4 (four) hours as needed for wheezing or shortness of breath. For shortness of breath    budesonide (PULMICORT) 180 MCG/ACT inhaler Inhale 2 puffs  into the lungs as needed (SOB/Wheezing).     Calcium Carbonate-Vitamin D (CALTRATE 600+D) 600-400 MG-UNIT per tablet Take 1 tablet by mouth daily.    dorzolamide-timolol (COSOPT) 22.3-6.8 MG/ML ophthalmic solution Place 1 drop into both eyes 2 (two) times daily.     ferrous sulfate 325 (65 FE) MG tablet Take 1 tablet (325 mg total) by mouth daily with breakfast. Qty: 30 tablet, Refills: 1    fluticasone (FLONASE) 50 MCG/ACT nasal spray Place into both nostrils as needed  for allergies or rhinitis.    guaiFENesin (MUCINEX) 600 MG 12 hr tablet Take 600 mg by mouth 2 (two) times daily as needed for cough or to loosen phlegm.     hydrocortisone cream 1 % Apply 1 application topically daily.    memantine (NAMENDA) 10 MG tablet Take 10 mg by mouth 2 (two) times daily.    Multiple Vitamins-Minerals (ALIVE WOMENS 50+ PO) Take by mouth daily.    nitroGLYCERIN (NITROSTAT) 0.4 MG SL tablet Place 1 tablet (0.4 mg total) under the tongue every 5 (five) minutes as needed for chest pain. Qty: 10 tablet, Refills: 2    pantoprazole (PROTONIX) 40 MG tablet Take 1 tablet (40 mg total) by mouth 2 (two) times daily before a meal. Qty: 60 tablet, Refills: 3    Probiotic CAPS Take 1 capsule by mouth daily.     Simethicone (GAS-X EXTRA STRENGTH) 125 MG CAPS Take 125 mg by mouth as needed (gas).     vitamin E 400 UNIT capsule Take 400 Units by mouth daily.      STOP taking these medications     lisinopril (PRINIVIL,ZESTRIL) 10 MG tablet      lovastatin (MEVACOR) 20 MG tablet      metoprolol succinate (TOPROL-XL) 50 MG 24 hr tablet      potassium chloride (K-DUR,KLOR-CON) 10 MEQ tablet        Allergies  Allergen Reactions  . Cymbalta [Duloxetine Hcl] Diarrhea and Other (See Comments)    weakness  . Dexilant [Dexlansoprazole] Diarrhea and Other (See Comments)    Bloating also   Follow-up Information    Follow up with [facility].   Why:  rolling walker arranged- to be delivered to room prior to discharge   Contact information:   [address] [place] [address] [phone]       Follow up with [facility].   Why:  [place]- PT arranged   Contact information:   [address] [place] [address] [phone]        The results of significant diagnostics from this hospitalization (including imaging, microbiology, ancillary and laboratory) are listed below for reference.    Significant Diagnostic  Studies: Ct Abdomen Pelvis W Contrast  03/28/2015  CLINICAL DATA:  Generalized abdominal pain radiating to the epigastric area. Nausea. EXAM: CT ABDOMEN AND PELVIS WITH CONTRAST TECHNIQUE: Multidetector CT imaging of the abdomen and pelvis was performed using the standard protocol following bolus administration of intravenous contrast. CONTRAST:  164mL OMNIPAQUE IOHEXOL 300 MG/ML  SOLN COMPARISON:  CT 07/08/2012 FINDINGS: Lower chest: Small bilateral pleural effusions. Associated atelectasis the left lung base. There is oral contrast in the distal esophagus. Liver: No focal lesion. Hepatobiliary: Postcholecystectomy with stable intra and extrahepatic biliary prominence. No calcified choledocholithiasis. Pancreas: Generalized atrophy.  No surrounding inflammation. Spleen: No focal lesion.  Small in size. Adrenal glands: No nodule. Kidneys: Symmetric renal enhancement and excretion. No hydronephrosis. Two cysts in the right kidney  are unchanged. Stomach/Bowel: Enteric tube in place, tip in the stomach. The stomach is distended with ingested contrast. Duodenum diverticulum is again seen without inflammation. Proximal small bowel loops are fluid-filled and distended with a transition point in the mid lower abdomen, axial images 61-64. Small bowel loops distal to this are decompressed. Small amount of adjacent free fluid and mesenteric edema. No pneumatosis. There is diverticulosis throughout the colon without diverticulitis. Enteric sutures noted in the sigmoid colon. No colonic inflammation. The appendix is not seen. Vascular/Lymphatic: No retroperitoneal adenopathy. Abdominal aorta is normal in caliber. Atherosclerosis of the abdominal aorta without aneurysm. Reproductive: The uterus is atrophic, normal for age ovaries are not visualized. There is no adnexal mass. Bladder: Distended with excreted intravenous contrast. Other: No free air. No intra-abdominal fluid collection. Scarring in the left lower quadrant  abdominal wall, likely prior colostomy site. Musculoskeletal: There are no acute or suspicious osseous abnormalities. Multilevel degenerative change throughout the lumbar spine. IMPRESSION: 1. Small bowel obstruction with transition point in the mid lower abdomen, likely due to adhesions. 2. Chronic findings include diverticulosis without diverticulitis, duodenum diverticulum, postcholecystectomy with stable biliary prominence, and right renal cysts. These results will be called to the ordering clinician or representative by the Radiologist Assistant, and communication documented in the PACS or zVision Dashboard. Electronically Signed   By: Jeb Levering M.D.   On: 03/28/2015 18:35   Dg Chest Port 1 View  03/27/2015  CLINICAL DATA:  Acute onset of generalized chest pain. Initial encounter. EXAM: PORTABLE CHEST 1 VIEW COMPARISON:  Chest radiograph performed 06/22/2014 FINDINGS: The lungs are well-aerated. Minimal left basilar atelectasis is noted. There is no evidence of pleural effusion or pneumothorax. The cardiomediastinal silhouette is borderline enlarged. The patient is status post median sternotomy. A pacemaker is noted overlying the left chest wall, with leads ending overlying the right atrium and right ventricle. No acute osseous abnormalities are seen. IMPRESSION: Minimal left basilar atelectasis noted.  Borderline cardiomegaly. Electronically Signed   By: Garald Balding M.D.   On: 03/27/2015 22:32   Dg Abd Portable 1v  03/31/2015  CLINICAL DATA:  81 year old female with acute generalized abdominal pain and nausea today. EXAM: PORTABLE ABDOMEN - 1 VIEW COMPARISON:  03/30/2015 and prior radiographs FINDINGS: A few mildly distended small bowel loops within the left abdomen are nonspecific. There is little gas identified within the colon. Surgical clips overlying the left abdomen and pelvis are present. IMPRESSION: Nonspecific mildly distended small bowel loops within the left abdomen. This may  represent a focal ileus or early bowel obstruction. Electronically Signed   By: Margarette Canada M.D.   On: 03/31/2015 13:53   Dg Abd Portable 1v  03/30/2015  CLINICAL DATA:  Abdominal distention. EXAM: PORTABLE ABDOMEN - 1 VIEW COMPARISON:  One-view abdomen 03/29/2015. FINDINGS: The bowel gas pattern is normal. Contrast is progressing, now evident within the ascending and proximal transverse colon. Urinary contrast is present in the bladder. Surgical clips are stable. IMPRESSION: Progression of contrast into the ascending and proximal transverse colon. Electronically Signed   By: San Morelle M.D.   On: 03/30/2015 09:36   Dg Abd Portable 1v-small Bowel Obstruction Protocol-initial, 8 Hr Delay  03/29/2015  CLINICAL DATA:  Small-bowel obstruction, left-sided abdominal pain since Thursday. EXAM: PORTABLE ABDOMEN - 1 VIEW COMPARISON:  Abdomen plain film from earlier same day. FINDINGS: Nasogastric tube remains well positioned with tip in the expected region of the stomach pylorus/ proximal duodenum. Contrast material again noted within the bladder. Surgical clips again  seen within the left pelvis. The dilated small bowel loops within the left upper quadrant are unchanged. No new area of bowel dilatation seen. No evidence of free intraperitoneal air seen. No abnormal fluid collection. Degenerative changes again noted within the lumbar spine. No acute osseous abnormality seen. IMPRESSION: Persistently dilated small bowel loops within the left upper quadrant consistent with the given clinical data of small bowel obstruction. No new findings seen. Electronically Signed   By: Franki Cabot M.D.   On: 03/29/2015 18:58   Dg Abd Portable 1v  03/29/2015  CLINICAL DATA:  Small bowel obstruction, no bowel movement for 3 days, periumbilical pain, history diverticulosis EXAM: PORTABLE ABDOMEN - 1 VIEW COMPARISON:  Portable exam 0744 hours compared to CT abdomen and pelvis 03/28/2015 FINDINGS: Tip of nasogastric tube  projects over distal gastric antrum Excreted contrast material within urinary bladder. Surgical clips LEFT upper quadrant and LEFT pelvis. Persisting gas distention of small bowel loops in the LEFT upper quadrant compatible with small bowel obstruction. No bowel wall thickening or or evidence of free intraperitoneal air on supine imaging. Degenerative disc and facet disease changes lumbar spine. IMPRESSION: Persistent dilatation of small bowel loops in the LEFT upper abdomen consistent with small bowel obstruction. Electronically Signed   By: Lavonia Dana M.D.   On: 03/29/2015 09:41   Dg Abd Portable 1v  03/28/2015  CLINICAL DATA:  NG tube placement. EXAM: PORTABLE ABDOMEN - 1 VIEW COMPARISON:  04/05/2015 at 14:02 p.m. FINDINGS: Nasogastric tube is present with tip within the stomach in the left upper quadrant and side-port just above the gastroesophageal junction. This could be advanced another 5 cm. Persistent gaseous distention of the stomach and small bowel loops in the left upper quadrant unchanged. Air and stool within the colon. Remainder the exam is unchanged. IMPRESSION: Gaseous distention of the stomach and mildly dilated air-filled small bowel loops in the left upper quadrant unchanged. Nasogastric tube with tip over the stomach and side-port just above the gastroesophageal junction. This could be advanced another 5 cm. Electronically Signed   By: Marin Olp M.D.   On: 03/28/2015 15:21   Dg Abd Portable 1v  03/28/2015  CLINICAL DATA:  NG tube placement. EXAM: PORTABLE ABDOMEN - 1 VIEW COMPARISON:  03/28/2015 at 9:36 a.m. FINDINGS: Nasogastric tube courses approximately 3 cm to the left lateral aspect of the spine over the lower thorax and turns back medially with tip just left of the spine at the level of the diaphragm. This may be within the esophagus just above the gastroesophageal junction although cannot exclude location within the left lower lobe bronchi. Persistent gaseous distention of  the stomach and a small bowel loops in the left upper abdomen. Remainder of the exam is unchanged. IMPRESSION: Nasogastric tube has tip at the level of the left hemidiaphragm just above the expected region of the gastroesophageal junction as this may be within the distal esophagus, although cannot exclude bronchial location. Persistent gaseous distention of the stomach and small bowel loops in the left upper quadrant without significant change. These results were called by telephone at the time of interpretation on 03/28/2015 at 2:17 pm to patient's nurse, Bobbe Medico, who verbally acknowledged these results. Electronically Signed   By: Marin Olp M.D.   On: 03/28/2015 14:18   Dg Abd Portable 2v  03/28/2015  CLINICAL DATA:  Abdominal pain EXAM: PORTABLE ABDOMEN - 2 VIEW COMPARISON:  08/13/2012 FINDINGS: Mild gaseous distention of the stomach and proximal small bowel with scattered small bowel  air-fluid levels. Remainder of the bowel is decompressed. No free air organomegaly. Surgical clips in the left pelvis. Contrast material noted within the urinary bladder of unknown etiology. IMPRESSION: Mild gaseous distention of the stomach and proximal small bowel. Cannot exclude proximal partial small bowel obstruction. Electronically Signed   By: Rolm Baptise M.D.   On: 03/28/2015 09:47    Microbiology: Recent Results (from the past 240 hour(s))  MRSA PCR Screening     Status: None   Collection Time: 03/27/15  8:00 PM  Result Value Ref Range Status   MRSA by PCR NEGATIVE NEGATIVE Final    Comment:        The GeneXpert MRSA Assay (FDA approved for NASAL specimens only), is one component of a comprehensive MRSA colonization surveillance program. It is not intended to diagnose MRSA infection nor to guide or monitor treatment for MRSA infections.      Labs: Basic Metabolic Panel:  Recent Labs Lab 03/27/15 1825 03/27/15 1858 03/28/15 0320 03/29/15 0310 03/30/15 0323 03/30/15 1250  03/31/15 0238  NA 138 140 139 134* 138  --  141  K 4.5 3.8 4.3 3.8 3.5  --  4.3  CL 102 102 104 101 107  --  109  CO2 27  --  26 26 23   --  27  GLUCOSE 122* 129* 127* 88 87  --  129*  BUN 7 9 6 13 19   --  17  CREATININE 1.09* 0.90 0.91 1.07* 1.14*  --  0.95  CALCIUM 9.7  --  8.9 8.0* 7.7*  --  8.4*  MG  --   --   --   --   --  2.5*  --    Liver Function Tests:  Recent Labs Lab 03/27/15 1825 03/28/15 1012  AST 42* 40  ALT 30 27  ALKPHOS 54 47  BILITOT 0.9 0.9  PROT 7.5 6.1*  ALBUMIN 4.0 3.3*    Recent Labs Lab 03/27/15 1825 03/28/15 1012  LIPASE 186* 104*  AMYLASE  --  134*   No results for input(s): AMMONIA in the last 168 hours. CBC:  Recent Labs Lab 03/27/15 1825  03/29/15 0310 03/30/15 0323 03/31/15 0238 04/01/15 0354 04/02/15 0230  WBC 14.9*  < > 10.1 6.5 3.5* 3.7* 4.2  NEUTROABS 12.5*  --   --   --   --   --   --   HGB 13.5  < > 11.8* 11.1* 11.6* 12.0 11.4*  HCT 41.4  < > 37.0 35.2* 36.9 37.4 36.1  MCV 91.4  < > 91.6 91.9 92.3 92.1 92.6  PLT 211  < > 189 198 219 210 245  < > = values in this interval not displayed. Cardiac Enzymes:  Recent Labs Lab 03/27/15 1840  CKTOTAL 52  CKMB 2.6  TROPONINI <0.03   BNP: BNP (last 3 results)  Recent Labs  06/10/14 1952 06/23/14 0008  BNP 143.5* 430.5*    ProBNP (last 3 results) No results for input(s): PROBNP in the last 8760 hours.  CBG: No results for input(s): GLUCAP in the last 168 hours.     SignedEulogio Bear  [facility] 04/02/2015, 1:17 PM

## 2015-04-02 NOTE — Progress Notes (Signed)
Patient to D/C home with daughter. Education done with handout with daughter. IV removed. Tele boxed removed. Patient taken to front to D/C home with daughter.   Domingo Dimes RN

## 2015-04-02 NOTE — Clinical Documentation Improvement (Signed)
Internal Medicine  Please clarify the cause of admit and update your documentation within the medical record to reflect your response to this query. Thank you!   SBO secondary to adhesions  Nstemi - ruled in or ruled out  "Rise in troponin's most likely secondary to demand ischemia in the setting of AF and hypertrophic CM" (H&P)  Other  Clinically Undetermined  Supporting Information:  Presents with abdominal/epigastric/chest pain? With abnormal EKG  Focus of care initially was heart but now transitions to her SBO  Were both conditions present/evolving on admission?  Please exercise your independent, professional judgment when responding. A specific answer is not anticipated or expected.  Thank You, Zoila Shutter RN, [place] [phone]

## 2015-04-10 DIAGNOSIS — I10 Essential (primary) hypertension: Secondary | ICD-10-CM | POA: Diagnosis not present

## 2015-04-10 DIAGNOSIS — I481 Persistent atrial fibrillation: Secondary | ICD-10-CM | POA: Diagnosis not present

## 2015-04-10 DIAGNOSIS — R109 Unspecified abdominal pain: Secondary | ICD-10-CM | POA: Diagnosis not present

## 2015-04-10 NOTE — Patient Outreach (Signed)
[facility]) Care Management  04/10/2015  LOLETTA HARPER 1925-02-13 [id number]   Referral from [facility] tier 4 list, assigned to Mariann Laster, RN for patient outreach.  Abiola Behring L. Dacie Mandel, [place] Care Management Assistant

## 2015-04-15 ENCOUNTER — Other Ambulatory Visit: Payer: Self-pay

## 2015-04-15 DIAGNOSIS — I10 Essential (primary) hypertension: Secondary | ICD-10-CM

## 2015-04-15 NOTE — Patient Outreach (Signed)
[facility]) Care Management  04/15/2015  Jasmine [name] May 05, 1925 [id number]   Request from Enzo Montgomery, RN to assign Community RN, assigned [name], RN.  Thanks, Jasmine [name]. [place], [place] Assistant Phone: [phone] Fax: [phone]

## 2015-04-15 NOTE — Patient Outreach (Signed)
[facility]) Care Management  04/15/2015  Jasmine [name] Aug 10, 1924 [id number]   Telephone Screen   Referral Date: 04/10/15 Referral Source: [facility] Tier 4 List Referral Reason: CHF,DM, 5 ED Visits, 3 Admits Primary MD: Dr. [name]   Outreach attempt #1 to patient. Patient reached. Screening completed with both patient and her dtr-Jasmine [name].  Social: Patient resides in the home with several family members including her brother and dtr. Dtr-Jasmine [name] is primary caregiver and assists with patient's care needs. She requires assistance with all ADLs at present. She is using a walker for ambulating short distances. Dtr states no other DME in the home. Patient was supposed to get [facility] PT services but was too weak and unable to participate during that time. She was discharged from the hospital on 04/02/15 for GI and cardiac issues.  Conditions: Past medical history includes: HTN,DM, A-fib, CKD. Per dtr patient with ongoing GI issues. She has had some weight lost. At present, patient is only able to tolerate baby food.    Medications: Dtr reports that she manages the patient's meds. Patient taking less than 10 meds. She denies any issues with affording meds at this time.  Appointments: Patient has already completed hospital discharge follow up appt with Dr. Inda Merlin. She has appt. With Dr. Collene Mares on 04/22/15. Dtr takes patient to all appointments.  Consent: Patient and dtr gave verbal consent for [facility] services.  Plan: RN CM will notify [facility] administrative assistant that patient agreed to services and case opened. RN CM will send [place] CM referral for transitions of care and in home evaluation and assessment. RN CM provided dtr with [facility] 24 hr Nurse Line contact info. RN CM confirmed that dtr is aware of how to seek medical attention for any changes in condition.  Enzo Montgomery, RN,BSN,CCM [place] Management Telephonic Care Management Coordinator Direct Phone:  [phone] Toll Free: [phone] Fax: [phone]

## 2015-04-16 ENCOUNTER — Encounter: Payer: Self-pay | Admitting: Cardiology

## 2015-04-17 ENCOUNTER — Other Ambulatory Visit: Payer: Self-pay

## 2015-04-17 NOTE — Patient Outreach (Signed)
[facility]) Care Management  04/17/2015  Jasmine [name] [birth year] [id number]  Assessment: RNCM received referral from telephonic care coordinator. RNCM call to schedule home visit and discuss transition of care program. Recent hospitalization -10/26. RNCM spoke with member's daughter(caregiver), Jasmine [name]. Medications reviewed-[name] with without questions or concerns at this time. Member has had follow up appointment with primary care physician. Discussed upcoming scheduled appointments. [name] reports that they have decided against Hospice care stating that member is getting better.  Plan: telephonic follow up call next week and home visit the following week.   [name], RN, MSN, [place] Coordinator Cell: [phone]

## 2015-04-21 ENCOUNTER — Other Ambulatory Visit: Payer: Self-pay

## 2015-04-21 NOTE — Patient Outreach (Signed)
[facility]) Care Management  04/21/2015  Jasmine [name] [birth year] [id number]  Assessment: RNCM called and spoke with caregiver/daughter, [name] who reports that member is doing well. She was eating at the time of the call. No problems or concerns voiced at this time.  Plan: Home visit scheduled for next week.  [name], RN, MSN, [place] Coordinator Cell: [phone]

## 2015-04-22 ENCOUNTER — Emergency Department (HOSPITAL_COMMUNITY): Payer: Commercial Managed Care - HMO

## 2015-04-22 ENCOUNTER — Emergency Department (HOSPITAL_COMMUNITY)
Admission: EM | Admit: 2015-04-22 | Discharge: 2015-04-22 | Disposition: A | Discharge status: Home / Self Care | Payer: Commercial Managed Care - HMO | Attending: Emergency Medicine | Admitting: Emergency Medicine

## 2015-04-22 ENCOUNTER — Encounter (HOSPITAL_COMMUNITY): Payer: Self-pay | Admitting: Emergency Medicine

## 2015-04-22 DIAGNOSIS — Z9889 Other specified postprocedural states: Secondary | ICD-10-CM | POA: Insufficient documentation

## 2015-04-22 DIAGNOSIS — G4700 Insomnia, unspecified: Secondary | ICD-10-CM | POA: Insufficient documentation

## 2015-04-22 DIAGNOSIS — I129 Hypertensive chronic kidney disease with stage 1 through stage 4 chronic kidney disease, or unspecified chronic kidney disease: Secondary | ICD-10-CM | POA: Diagnosis not present

## 2015-04-22 DIAGNOSIS — I2510 Atherosclerotic heart disease of native coronary artery without angina pectoris: Secondary | ICD-10-CM | POA: Diagnosis not present

## 2015-04-22 DIAGNOSIS — R1084 Generalized abdominal pain: Secondary | ICD-10-CM | POA: Diagnosis present

## 2015-04-22 DIAGNOSIS — F0390 Unspecified dementia without behavioral disturbance: Secondary | ICD-10-CM | POA: Diagnosis not present

## 2015-04-22 DIAGNOSIS — I4891 Unspecified atrial fibrillation: Secondary | ICD-10-CM | POA: Diagnosis not present

## 2015-04-22 DIAGNOSIS — N183 Chronic kidney disease, stage 3 (moderate): Secondary | ICD-10-CM | POA: Insufficient documentation

## 2015-04-22 DIAGNOSIS — Z7952 Long term (current) use of systemic steroids: Secondary | ICD-10-CM | POA: Diagnosis not present

## 2015-04-22 DIAGNOSIS — E785 Hyperlipidemia, unspecified: Secondary | ICD-10-CM | POA: Diagnosis not present

## 2015-04-22 DIAGNOSIS — Z87891 Personal history of nicotine dependence: Secondary | ICD-10-CM | POA: Diagnosis not present

## 2015-04-22 DIAGNOSIS — K5900 Constipation, unspecified: Secondary | ICD-10-CM | POA: Insufficient documentation

## 2015-04-22 DIAGNOSIS — Z79899 Other long term (current) drug therapy: Secondary | ICD-10-CM | POA: Insufficient documentation

## 2015-04-22 DIAGNOSIS — Z955 Presence of coronary angioplasty implant and graft: Secondary | ICD-10-CM | POA: Insufficient documentation

## 2015-04-22 DIAGNOSIS — Z7951 Long term (current) use of inhaled steroids: Secondary | ICD-10-CM | POA: Diagnosis not present

## 2015-04-22 DIAGNOSIS — R339 Retention of urine, unspecified: Secondary | ICD-10-CM | POA: Diagnosis not present

## 2015-04-22 DIAGNOSIS — I471 Supraventricular tachycardia: Secondary | ICD-10-CM | POA: Diagnosis not present

## 2015-04-22 DIAGNOSIS — K219 Gastro-esophageal reflux disease without esophagitis: Secondary | ICD-10-CM | POA: Diagnosis not present

## 2015-04-22 DIAGNOSIS — R1310 Dysphagia, unspecified: Secondary | ICD-10-CM | POA: Diagnosis not present

## 2015-04-22 LAB — COMPREHENSIVE METABOLIC PANEL
ALT: 19 U/L (ref 14–54)
AST: 32 U/L (ref 15–41)
Albumin: 3.1 g/dL — ABNORMAL LOW (ref 3.5–5.0)
Alkaline Phosphatase: 43 U/L (ref 38–126)
Anion gap: 4 — ABNORMAL LOW (ref 5–15)
BILIRUBIN TOTAL: 0.3 mg/dL (ref 0.3–1.2)
BUN: 10 mg/dL (ref 6–20)
CALCIUM: 9 mg/dL (ref 8.9–10.3)
CHLORIDE: 108 mmol/L (ref 101–111)
CO2: 27 mmol/L (ref 22–32)
CREATININE: 1.07 mg/dL — AB (ref 0.44–1.00)
GFR, EST AFRICAN AMERICAN: 52 mL/min — AB (ref >60)
GFR, EST NON AFRICAN AMERICAN: 45 mL/min — AB (ref >60)
GLUCOSE: 107 mg/dL — AB (ref 65–99)
Potassium: 4.8 mmol/L (ref 3.5–5.1)
Sodium: 139 mmol/L (ref 135–145)
Total Protein: 6.1 g/dL — ABNORMAL LOW (ref 6.5–8.1)

## 2015-04-22 LAB — URINALYSIS, ROUTINE W REFLEX MICROSCOPIC
BILIRUBIN URINE: NEGATIVE
GLUCOSE, UA: NEGATIVE mg/dL
HGB URINE DIPSTICK: NEGATIVE
KETONES UR: NEGATIVE mg/dL
Leukocytes, UA: NEGATIVE
Nitrite: NEGATIVE
PROTEIN: NEGATIVE mg/dL
Specific Gravity, Urine: 1.024 (ref 1.005–1.030)
pH: 6 (ref 5.0–8.0)

## 2015-04-22 LAB — CBC
HCT: 40.5 % (ref 36.0–46.0)
Hemoglobin: 12.6 g/dL (ref 12.0–15.0)
MCH: 28.9 pg (ref 26.0–34.0)
MCHC: 31.1 g/dL (ref 30.0–36.0)
MCV: 92.9 fL (ref 78.0–100.0)
PLATELETS: 198 10*3/uL (ref 150–400)
RBC: 4.36 MIL/uL (ref 3.87–5.11)
RDW: 15.5 % (ref 11.5–15.5)
WBC: 6.9 10*3/uL (ref 4.0–10.5)

## 2015-04-22 LAB — LIPASE, BLOOD: LIPASE: 69 U/L — AB (ref 11–51)

## 2015-04-22 MED ORDER — IOHEXOL 300 MG/ML  SOLN
75.0000 mL | Freq: Once | INTRAMUSCULAR | Status: AC | PRN
  Administered 2015-04-22: 75 mL via INTRAVENOUS

## 2015-04-22 NOTE — ED Notes (Signed)
Pt sts unable to urinate or have a BM; no BM x 1 week and urine since this am; pt sts having pain

## 2015-04-22 NOTE — Discharge Instructions (Signed)
Take MiraLAX. Use 8 scoops in 32 ounces of whatever drink you prefer. Stay near a bathroom. Use a fleets enema Constipation, Adult Constipation is when a person has fewer than three bowel movements a week, has difficulty having a bowel movement, or has stools that are dry, hard, or larger than normal. As people grow older, constipation is more common. A low-fiber diet, not taking in enough fluids, and taking certain medicines may make constipation worse.  CAUSES   Certain medicines, such as antidepressants, pain medicine, iron supplements, antacids, and water pills.   Certain diseases, such as diabetes, irritable bowel syndrome (IBS), thyroid disease, or depression.   Not drinking enough water.   Not eating enough fiber-rich foods.   Stress or travel.   Lack of physical activity or exercise.   Ignoring the urge to have a bowel movement.   Using laxatives too much.  SIGNS AND SYMPTOMS   Having fewer than three bowel movements a week.   Straining to have a bowel movement.   Having stools that are hard, dry, or larger than normal.   Feeling full or bloated.   Pain in the lower abdomen.   Not feeling relief after having a bowel movement.  DIAGNOSIS  Your health care provider will take a medical history and perform a physical exam. Further testing may be done for severe constipation. Some tests may include:  A barium enema X-ray to examine your rectum, colon, and, sometimes, your small intestine.   A sigmoidoscopy to examine your lower colon.   A colonoscopy to examine your entire colon. TREATMENT  Treatment will depend on the severity of your constipation and what is causing it. Some dietary treatments include drinking more fluids and eating more fiber-rich foods. Lifestyle treatments may include regular exercise. If these diet and lifestyle recommendations do not help, your health care provider may recommend taking over-the-counter laxative medicines to help you  have bowel movements. Prescription medicines may be prescribed if over-the-counter medicines do not work.  HOME CARE INSTRUCTIONS   Eat foods that have a lot of fiber, such as fruits, vegetables, whole grains, and beans.  Limit foods high in fat and processed sugars, such as french fries, hamburgers, cookies, candies, and soda.   A fiber supplement may be added to your diet if you cannot get enough fiber from foods.   Drink enough fluids to keep your urine clear or pale yellow.   Exercise regularly or as directed by your health care provider.   Go to the restroom when you have the urge to go. Do not hold it.   Only take over-the-counter or prescription medicines as directed by your health care provider. Do not take other medicines for constipation without talking to your health care provider first.  [place] IF:   You have bright red blood in your stool.   Your constipation lasts for more than 4 days or gets worse.   You have abdominal or rectal pain.   You have thin, pencil-like stools.   You have unexplained weight loss. MAKE SURE YOU:   Understand these instructions.  Will watch your condition.  Will get help right away if you are not doing well or get worse.   This information is not intended to replace advice given to you by your health care provider. Make sure you discuss any questions you have with your health care provider.   Document Released: 02/20/2004 Document Revised: 06/14/2014 Document Reviewed: 03/05/2013 Elsevier Interactive Patient Education [employer].

## 2015-04-22 NOTE — ED Notes (Signed)
MD at bedside. 

## 2015-04-22 NOTE — ED Provider Notes (Signed)
CSN: [id number]     Arrival date & time 04/22/15  1612 History   First MD Initiated Contact with Patient 04/22/15 1653     Chief Complaint  Patient presents with  . Abdominal Pain  . Urinary Retention     (Consider location/radiation/quality/duration/timing/severity/associated sxs/prior Treatment) Patient is a 81 y.o. female presenting with abdominal pain. The history is provided by the patient and a relative.  Abdominal Pain Pain location:  Generalized Pain quality: cramping, sharp and shooting   Pain radiates to:  Does not radiate Pain severity:  Severe Onset quality:  Gradual Duration:  1 week Timing:  Constant Progression:  Worsening Chronicity:  Recurrent Relieved by:  Nothing Worsened by:  Nothing tried Ineffective treatments:  None tried Associated symptoms: constipation   Associated symptoms: no chest pain, no chills, no dysuria, no fever, no nausea, no shortness of breath and no vomiting     81 yo F with a chief complaints of constipation. This been going on for about a week. Patient has a history of this in the past. Has been trying prunes and greens and stool softeners without relief. Patient denies fevers or chills denies vomiting or nausea. Patient had no episodes with urination until today when she feels like she's having difficulty urinating. Patient had similar symptoms in the past was diagnosed with a small bowel obstruction and admitted to the hospital. Patient thinks it feels similar to that but maybe less severe. Crampy all over pain comes in waves.  Past Medical History  Diagnosis Date  . GERD (gastroesophageal reflux disease)   . Insomnia   . Eczema   . Glaucoma   . HOCM (hypertrophic obstructive cardiomyopathy) ([place])     a. s/p septal myomectomy;  b. 06/2014 Echo: EF 50-55%, mild antsept HK, mild MR, mild MS/MR (bioprosth valve), mildly dil LA, mod TR, PASP 94mmHg.  Marland Kitchen Hyperlipidemia   . Non-obstructive coronary artery disease     a. 06/2014 NSTEMI/Cath:  LM nl, LAD nl, LCX nl, RCA 10-56m, EF 45%.  . Blood transfusion   . Chronic kidney disease (CKD), stage III (moderate)   . History of Severe Mitral Regurgitation     a. s/p MVR with pericardial tissue valve;  b. 06/2014 Mild Mitral Stenosis by echo.  . Diverticulosis of colon with hemorrhage 08/12/2012  . History of GI Bleed     a. prevents use of [place].  Marland Kitchen Asthma   . Essential hypertension   . Reflux esophagitis   . Pulmonary HTN ([place])     a. mild to moderate (PASP 55mmHg by echo 06/2014).  Marland Kitchen Aortic insufficiency     a. 06/2014 Echo: Mild AI.  Marland Kitchen Bradycardia     a. 10/2011 s/p SJM [facility] DR DC PPM, ser #: [id number].  . Atrial fibrillation and flutter     a. atrial fibrillation s/p DCCV 06-2012;  b. now in chronic atrial fibrillation off anticoagulation due to GI bleed.  . Paroxysmal atrial tachycardia ([place])   . PVCs (premature ventricular contractions)   . Dementia   . Atypical chest pain    Past Surgical History  Procedure Laterality Date  . Cardiac catheterization    . Cardiac valve replacement  2007    MVR  . Myomectomy  2007    septal  . Tonsillectomy    . Appendectomy    . Cholecystectomy    . Tubal ligation    . Hernia repair      "in my stomach"  . Cataract extraction w/ intraocular  lens  implant, bilateral    . Cardioversion  07/04/2012    Procedure: CARDIOVERSION;  Surgeon: Sueanne Margarita, MD;  Location[facility] ENDOSCOPY;  Service: Cardiovascular;  Laterality: N/A;  h/p in file drawer   . Colonoscopy    . Colon resection    . Colonoscopy N/A 08/13/2012    Procedure: COLONOSCOPY;  Surgeon: Gatha Mayer, MD;  Location: [place];  Service: Endoscopy;  Laterality: N/A;  . Permanent pacemaker insertion N/A 10/07/2011    Procedure: PERMANENT PACEMAKER INSERTION;  Surgeon: Deboraha Sprang, MD;  Location: [facility] CATH LAB;  Service: Cardiovascular;  Laterality: N/A;  . Left heart catheterization with coronary angiogram N/A 06/11/2014    Procedure: LEFT HEART CATHETERIZATION WITH CORONARY  ANGIOGRAM;  Surgeon: Troy Sine, MD;  Location: [facility] CATH LAB;  Service: Cardiovascular;  Laterality: N/A;  . Cardiac catheterization N/A 03/27/2015    Procedure: Left Heart Cath and Coronary Angiography;  Surgeon: Leonie Man, MD;  Location: [place] CV LAB;  Service: Cardiovascular;  Laterality: N/A;   Family History  Problem Relation Age of Onset  . CVA Mother   . Hypertension Mother   . CVA Brother   . Hypertension Brother    Social History  Substance Use Topics  . Smoking status: Former Smoker    Types: Cigarettes    Quit date: 06/07/1974  . Smokeless tobacco: Former Systems developer    Types: Snuff    Quit date: 04/23/1975     Comment: 10/05/11 "  . Alcohol Use: No     Comment: 10/05/11 "used to drink; quit a long time ago"   OB History    No data available     Review of Systems  Constitutional: Negative for fever and chills.  HENT: Negative for congestion and rhinorrhea.   Eyes: Negative for redness and visual disturbance.  Respiratory: Negative for shortness of breath and wheezing.   Cardiovascular: Negative for chest pain and palpitations.  Gastrointestinal: Positive for abdominal pain and constipation. Negative for nausea and vomiting.  Genitourinary: Positive for difficulty urinating. Negative for dysuria and urgency.  Musculoskeletal: Negative for myalgias and arthralgias.  Skin: Negative for pallor and wound.  Neurological: Negative for dizziness and headaches.      Allergies  Cymbalta and Dexilant  Home Medications   Prior to Admission medications   Medication Sig Start Date End Date Taking? Authorizing Provider  acetaminophen (TYLENOL) 500 MG tablet Take 500 mg by mouth every 6 (six) hours as needed. For pain   Yes Historical Provider, MD  budesonide (PULMICORT) 180 MCG/ACT inhaler Inhale 2 puffs into the lungs daily as needed (SOB/Wheezing).    Yes Historical Provider, MD  Calcium Carbonate-Vitamin D (CALTRATE 600+D) 600-400 MG-UNIT per tablet Take 1  tablet by mouth daily.   Yes Historical Provider, MD  diltiazem (CARDIZEM CD) 120 MG 24 hr capsule Take 1 capsule (120 mg total) by mouth daily. 04/02/15  Yes Jessica U Vann, DO  dorzolamide-timolol (COSOPT) 22.3-6.8 MG/ML ophthalmic solution Place 1 drop into both eyes 2 (two) times daily.  07/19/14  Yes Historical Provider, MD  ferrous sulfate 325 (65 FE) MG tablet Take 1 tablet (325 mg total) by mouth daily with breakfast. 10/20/14  Yes Delfina Redwood, MD  fluticasone (FLONASE) 50 MCG/ACT nasal spray Place 1 spray into both nostrils daily as needed for allergies or rhinitis.    Yes Historical Provider, MD  guaiFENesin (MUCINEX) 600 MG 12 hr tablet Take 600 mg by mouth 2 (two) times daily as  needed for cough or to loosen phlegm.    Yes Historical Provider, MD  hydrocortisone cream 1 % Apply 1 application topically daily.   Yes Historical Provider, MD  memantine (NAMENDA) 10 MG tablet Take 10 mg by mouth 2 (two) times daily.   Yes Historical Provider, MD  metoprolol (LOPRESSOR) 50 MG tablet Take 1 tablet (50 mg total) by mouth 2 (two) times daily. 04/02/15  Yes Geradine Girt, DO  Multiple Vitamins-Minerals (ALIVE WOMENS 50+ PO) Take by mouth daily.   Yes Historical Provider, MD  nitroGLYCERIN (NITROSTAT) 0.4 MG SL tablet Place 1 tablet (0.4 mg total) under the tongue every 5 (five) minutes as needed for chest pain. 06/14/14  Yes Marianne L York, PA-C  ondansetron (ZOFRAN) 4 MG tablet Take 1 tablet (4 mg total) by mouth every 8 (eight) hours as needed for nausea or vomiting. 04/02/15  Yes Geradine Girt, DO  pantoprazole (PROTONIX) 40 MG tablet Take 1 tablet (40 mg total) by mouth 2 (two) times daily before a meal. 06/26/14  Yes Charlynne Cousins, MD  Probiotic CAPS Take 1 capsule by mouth daily.    Yes Historical Provider, MD  Simethicone (GAS-X EXTRA STRENGTH) 125 MG CAPS Take 125 mg by mouth daily as needed (gas).    Yes Historical Provider, MD  vitamin E 400 UNIT capsule Take 400 Units by mouth  daily.   Yes Historical Provider, MD  albuterol (PROVENTIL HFA;VENTOLIN HFA) 108 (90 BASE) MCG/ACT inhaler Inhale 2 puffs into the lungs every 4 (four) hours as needed for wheezing or shortness of breath. For shortness of breath    Historical Provider, MD   BP 145/89 mmHg  Pulse 65  Temp(Src) 98.5 F (36.9 C) (Oral)  Resp 20  SpO2 99% Physical Exam  Constitutional: She is oriented to person, place, and time. She appears well-developed and well-nourished. No distress.  HENT:  Head: Normocephalic and atraumatic.  Eyes: EOM are normal. Pupils are equal, round, and reactive to light.  Neck: Normal range of motion. Neck supple.  Cardiovascular: Normal rate and regular rhythm.  Exam reveals no gallop and no friction rub.   No murmur heard. Pulmonary/Chest: Effort normal. She has no wheezes. She has no rales.  Abdominal: Soft. She exhibits no distension. There is no tenderness. There is no rebound and no guarding.  Genitourinary:  Brown soft stool in the vault  Musculoskeletal: She exhibits no edema or tenderness.  Neurological: She is alert and oriented to person, place, and time.  Skin: Skin is warm and dry. She is not diaphoretic.  Psychiatric: She has a normal mood and affect. Her behavior is normal.  Nursing note and vitals reviewed.   ED Course  Fecal disimpaction Date/Time: 04/22/2015 5:32 PM Performed by: Tyrone Nine Kyleah [place] Authorized by: Deno Etienne Consent: Verbal consent obtained. Risks and benefits: risks, benefits and alternatives were discussed Consent given by: patient Required items: required blood products, implants, devices, and special equipment available Time out: Immediately prior to procedure a "time out" was called to verify the correct patient, procedure, equipment, support staff and site/side marked as required. Preparation: Patient was prepped and draped in the usual sterile fashion. Local anesthesia used: no Patient sedated: no Patient tolerance: Patient tolerated  the procedure well with no immediate complications   (including critical care time) Labs Review Labs Reviewed  COMPREHENSIVE METABOLIC PANEL - Abnormal; Notable for the following:    Glucose, Bld 107 (*)    Creatinine, Ser 1.07 (*)    Total Protein 6.1 (*)  Albumin 3.1 (*)    GFR calc non Af Amer 45 (*)    GFR calc Af Amer 52 (*)    Anion gap 4 (*)    All other components within normal limits  LIPASE, BLOOD - Abnormal; Notable for the following:    Lipase 69 (*)    All other components within normal limits  CBC  URINALYSIS, ROUTINE W REFLEX MICROSCOPIC (NOT AT [facility])    Imaging Review Ct Abdomen Pelvis W Contrast  04/22/2015  CLINICAL DATA:  Acute onset of worsening generalized abdominal pain and constipation. Initial encounter. EXAM: CT ABDOMEN AND PELVIS WITH CONTRAST TECHNIQUE: Multidetector CT imaging of the abdomen and pelvis was performed using the standard protocol following bolus administration of intravenous contrast. CONTRAST:  1mL OMNIPAQUE IOHEXOL 300 MG/ML  SOLN COMPARISON:  CT of the abdomen and pelvis from 03/28/2015, and abdominal radiograph performed 03/31/2015 FINDINGS: Trace left-sided pleural fluid is noted. The patient is status post median sternotomy. Pacemaker leads are partially imaged. Calcification is noted at the mitral valve. Two small diverticula are seen arising from the gastric fundus. Scattered postoperative change is noted at the left upper quadrant. The liver and spleen are unremarkable in appearance. The patient is status post cholecystectomy. The pancreas and adrenal glands are unremarkable. Small right renal cysts measure up to 2.0 cm in size. The kidneys are otherwise unremarkable. There is no evidence of hydronephrosis. No renal or ureteral stones are seen. No perinephric stranding is appreciated. No free fluid is identified. The small bowel is unremarkable in appearance. The stomach is otherwise within normal limits. No acute vascular abnormalities  are seen. Scattered calcification is noted along the abdominal aorta and its branches. The patient is status post appendectomy. Scattered diverticulosis is noted along nearly the entirety of the colon, with partial sparing of the sigmoid colon. Presacral stranding is nonspecific in appearance, though would correlate clinically to exclude mild proctitis. The bladder is mildly distended and grossly unremarkable. The uterus is unremarkable in appearance. The ovaries are grossly symmetric. No suspicious adnexal masses are seen. No inguinal lymphadenopathy is seen. No acute osseous abnormalities are identified. Multilevel vacuum phenomenon is noted along the lumbar spine, with underlying facet disease. IMPRESSION: 1. Nonspecific presacral stranding noted. Would correlate clinically to exclude mild proctitis. The sigmoid colon is partially filled with stool. 2. Scattered diverticulosis noted along nearly the entirety of the colon, with partial sparing of the sigmoid colon. No evidence of diverticulitis. 3. Trace left-sided pleural fluid noted. 4. Two small diverticula noted arising at the gastric fundus. 5. Small right renal cysts noted. 6. Scattered calcification along the abdominal aorta and its branches. 7. Mild degenerative change along the lumbar spine. Electronically Signed   By: Garald Balding M.D.   On: 04/22/2015 18:36   I have personally reviewed and evaluated these images and lab results as part of my medical decision-making.   EKG Interpretation None      MDM   Final diagnoses:  Constipation, unspecified constipation type    81 yo F with a chief complaint of constipation. Likely fecal impaction which was removed at bedside. Patient however has a history of bowel obstruction will obtain a CT scan to rule out.  CT scan negative, elevated lipase, though no noted epigastric abdominal pain.  Patient feeling much better after disimpaction.  PCP follow up.  11:10 PM:  I have discussed the  diagnosis/risks/treatment options with the patient and family and believe the pt to be eligible for discharge home to follow-up  with PCP. We also discussed returning to the ED immediately if new or worsening sx occur. We discussed the sx which are most concerning (e.g., sudden worsening pain, fever, inability to tolerate by mouth) that necessitate immediate return. Medications administered to the patient during their visit and any new prescriptions provided to the patient are listed below.  Medications given during this visit Medications  iohexol (OMNIPAQUE) 300 MG/ML solution 75 mL (75 mLs Intravenous Contrast Given 04/22/15 1809)    Discharge Medication List as of 04/22/2015  6:41 PM      The patient appears reasonably screen and/or stabilized for discharge and I doubt any other medical condition or other [facility] requiring further screening, evaluation, or treatment in the ED at this time prior to discharge.     Deno Etienne, DO 04/22/15 2310

## 2015-04-28 ENCOUNTER — Other Ambulatory Visit: Payer: Self-pay

## 2015-04-28 ENCOUNTER — Ambulatory Visit: Payer: Self-pay

## 2015-04-29 NOTE — Patient Outreach (Addendum)
[place] [facility]) Care Management  [place]  04/28/2015   Jasmine [name] 1924/07/23 [id number]  Subjective: Member reports feeling much better since discharge from hospital. Member denies abdominal pain, denies constipation and reports feeling strong enough for home health physical therapy.  Objective: BP 108/58 mmHg  Pulse 80  Resp 20  Ht 1.727 m (5\' 8" )  Wt 154 lb 9.6 oz (70.126 kg)  BMI 23.51 kg/m2  SpO2 100%, lungs clear, heart rate irregular.   Current Medications:  Current Outpatient Prescriptions  Medication Sig Dispense Refill  . acetaminophen (TYLENOL) 500 MG tablet Take 500 mg by mouth every 6 (six) hours as needed. For pain    . albuterol (PROVENTIL HFA;VENTOLIN HFA) 108 (90 BASE) MCG/ACT inhaler Inhale 2 puffs into the lungs every 4 (four) hours as needed for wheezing or shortness of breath. For shortness of breath    . budesonide (PULMICORT) 180 MCG/ACT inhaler Inhale 2 puffs into the lungs daily as needed (SOB/Wheezing).     . Calcium Carbonate-Vitamin D (CALTRATE 600+D) 600-400 MG-UNIT per tablet Take 1 tablet by mouth daily.    . cholecalciferol (VITAMIN D) 400 UNITS TABS tablet Take 400 Units by mouth daily.    Marland Kitchen diltiazem (CARDIZEM CD) 120 MG 24 hr capsule Take 1 capsule (120 mg total) by mouth daily. 30 capsule 0  . dorzolamide-timolol (COSOPT) 22.3-6.8 MG/ML ophthalmic solution Place 1 drop into both eyes 2 (two) times daily.     . ferrous sulfate 325 (65 FE) MG tablet Take 1 tablet (325 mg total) by mouth daily with breakfast. 30 tablet 1  . fluticasone (FLONASE) 50 MCG/ACT nasal spray Place 1 spray into both nostrils daily as needed for allergies or rhinitis.     Marland Kitchen FOLIC ACID-VIT Q000111Q 123456 PO Take 1 tablet by mouth daily.    Marland Kitchen guaiFENesin (MUCINEX) 600 MG 12 hr tablet Take 600 mg by mouth 2 (two) times daily as needed for cough or to loosen phlegm.     . hydrocortisone cream 1 % Apply 1 application topically daily.    . memantine  (NAMENDA) 10 MG tablet Take 10 mg by mouth 2 (two) times daily.    . metoprolol (LOPRESSOR) 50 MG tablet Take 1 tablet (50 mg total) by mouth 2 (two) times daily. 60 tablet 0  . Multiple Vitamins-Minerals (ALIVE WOMENS 50+ PO) Take by mouth daily.    . nitroGLYCERIN (NITROSTAT) 0.4 MG SL tablet Place 1 tablet (0.4 mg total) under the tongue every 5 (five) minutes as needed for chest pain. 10 tablet 2  . pantoprazole (PROTONIX) 40 MG tablet Take 1 tablet (40 mg total) by mouth 2 (two) times daily before a meal. 60 tablet 3  . Simethicone (GAS-X EXTRA STRENGTH) 125 MG CAPS Take 125 mg by mouth daily as needed (gas).     . ondansetron (ZOFRAN) 4 MG tablet Take 1 tablet (4 mg total) by mouth every 8 (eight) hours as needed for nausea or vomiting. (Patient not taking: Reported on 04/28/2015) 20 tablet 0  . Probiotic CAPS Take 1 capsule by mouth daily.     . vitamin E 400 UNIT capsule Take 400 Units by mouth daily.     No current facility-administered medications for this visit.    Functional Status:  In your present state of health, do you have any difficulty performing the following activities: 04/28/2015 03/27/2015  Hearing? N N  Vision? N N  Difficulty concentrating or making decisions? N N  Walking or climbing  stairs? N N  Dressing or bathing? Y N  Doing errands, shopping? Y N  Preparing Food and eating ? N -  Using the Toilet? N -  In the past six months, have you accidently leaked urine? N -  Do you have problems with loss of bowel control? N -  Managing your Medications? N -  Managing your Finances? N -  Housekeeping or managing your Housekeeping? N -    Fall/Depression Screening: PHQ 2/9 Scores 04/28/2015  PHQ - 2 Score 0   Fall Risk  04/28/2015  Falls in the past year? No    Assessment: 81 year old with recent admission for abdominal pain, small bowel obstruction. Member is alert and oriented to self, place, time. Member lives with daughter, Jasmine [name] and other family  members-reports a very supportive family.  Member has been to her follow up appointment with both primary care and gastroenterologist. Denies any abdominal discomfort. Member reports bowel movements are normal.  Medications reviewed-daughter manages member's medications. Daughter/member with no questions.  Decreased strength-daughter reports when member first got of the hospital, she was too weak to participate in physical therapy sessions. Denies falls. Member is regaining her strength and would like home health physical therapy to assist with increased strength/mobility.   No other needs identified at this time.  Plan: Send update to primary care, telephonic transition of care follow up next week.  THN CM Care Plan Problem One        Most Recent Value   Care Plan Problem One  at risk for readmission   Role Documenting the Problem One  Care Management [place] for Problem One  Active   THN Long Term Goal (31-90 days)  member will not be readmitted within the next 31 days.   THN Long Term Goal Start Date  04/17/15   Interventions for Problem One Long Term Goal  home visit, reviewed medications,encouraged member/care giver to contact RNCM as needed, reinforced 24hour nurse advice line availability.   THN CM Short Term Goal #1 (0-30 days)  member will attend follow up appointments as scheduled within the next 30 days.   THN CM Short Term Goal #1 Start Date  04/17/15   Interventions for Short Term Goal #1  discussed upcoming follow up appointments.   THN CM Short Term Goal #2 (0-30 days)  member will participate with home health as scheduled within the next 30 days.   THN CM Short Term Goal #2 Start Date  04/17/15   Interventions for Short Term Goal #2  discussed initialtion of home health physcial therapy, update primary care provider-recommend home health physical therapy.     [name], RN, MSN, [place] Coordinator Cell: [phone]

## 2015-05-09 ENCOUNTER — Other Ambulatory Visit: Payer: Self-pay

## 2015-05-09 NOTE — Patient Outreach (Signed)
[place] [facility]) Care Management  05/09/2015  Jasmine [name] [birth year] [id number]  Assessment: RNCM called for transition of care. No answer. HIPPA compliant message left.  Plan: Follow up call next week.  [name], RN, MSN, [place] Coordinator Cell: [phone]

## 2015-05-12 DIAGNOSIS — I10 Essential (primary) hypertension: Secondary | ICD-10-CM | POA: Diagnosis not present

## 2015-05-16 ENCOUNTER — Other Ambulatory Visit: Payer: Self-pay

## 2015-05-16 NOTE — Patient Outreach (Signed)
[place] [facility]) Care Management  05/16/2015  Jasmine [name] [birth year] [id number]  Assessment: Transition of care call. No answer. HIPPA compliant message left.  Plan: follow up call next week.  [name], RN, MSN, [place] Coordinator Cell: [phone]

## 2015-05-28 ENCOUNTER — Other Ambulatory Visit: Payer: Self-pay

## 2015-05-28 NOTE — Patient Outreach (Signed)
[place] [facility]) Care Management  05/28/2015  MYSTICA CEDERQUIST [birth year] [id number]  Assessment: Call for transition of care. No answer. HIPPA compliant voice message left- 3rd attempt.  Plan: per policy and procedure send outreach letter.  [name], RN, MSN, [place] Coordinator Cell: [phone]

## 2015-06-04 ENCOUNTER — Telehealth: Payer: Self-pay | Admitting: Cardiology

## 2015-06-04 ENCOUNTER — Encounter: Payer: Commercial Managed Care - HMO | Admitting: *Deleted

## 2015-06-04 NOTE — Telephone Encounter (Signed)
Spoke with pt and reminded pt of remote transmission that is due today. Pt verbalized understanding.   

## 2015-06-05 ENCOUNTER — Encounter: Payer: Self-pay | Admitting: Cardiology

## 2015-06-13 ENCOUNTER — Other Ambulatory Visit: Payer: Self-pay

## 2015-06-13 ENCOUNTER — Telehealth: Payer: Self-pay | Admitting: Internal Medicine

## 2015-06-13 NOTE — Telephone Encounter (Signed)
New message      Need help sending remote transmission.  Please call

## 2015-06-13 NOTE — Patient Outreach (Addendum)
[place] [facility]) Care Management  06/13/2015  Jasmine [name] June 15, 1924 [id number]  Attempted to contact 12/2, 12/9, 12/21 with No response. Outreach letter sent on 12/21 with no response.   Plan: close case. Send letter to primary care.  [name], RN, MSN, [place] Coordinator Cell: [phone]

## 2015-06-13 NOTE — Telephone Encounter (Signed)
Spoke w/ pt daughter and instructed her how to send remote transmission. She verbalized understanding and said she would try again once she gets home later today.

## 2015-06-17 ENCOUNTER — Ambulatory Visit (INDEPENDENT_AMBULATORY_CARE_PROVIDER_SITE_OTHER): Payer: Commercial Managed Care - HMO | Admitting: *Deleted

## 2015-06-17 DIAGNOSIS — I495 Sick sinus syndrome: Secondary | ICD-10-CM

## 2015-06-27 NOTE — Progress Notes (Signed)
Remote pacemaker transmission.   

## 2015-07-07 LAB — CUP PACEART REMOTE DEVICE CHECK
Battery Remaining Longevity: 131 mo
Battery Remaining Percentage: 91 %
Brady Statistic RV Percent Paced: 29 %
Date Time Interrogation Session: 20170110143003
Implantable Lead Implant Date: 20130502
Implantable Lead Model: 1948
Lead Channel Impedance Value: 660 Ohm
Lead Channel Sensing Intrinsic Amplitude: 12 mV
Lead Channel Setting Pacing Amplitude: 2.5 V
Lead Channel Setting Pacing Pulse Width: 0.4 ms
Lead Channel Setting Sensing Sensitivity: 2 mV
MDC IDC LEAD IMPLANT DT: 20130502
MDC IDC LEAD LOCATION: 753859
MDC IDC LEAD LOCATION: 753860
MDC IDC MSMT BATTERY VOLTAGE: 2.95 V
MDC IDC MSMT LEADCHNL RV PACING THRESHOLD AMPLITUDE: 0.75 V
MDC IDC MSMT LEADCHNL RV PACING THRESHOLD PULSEWIDTH: 0.4 ms
Pulse Gen Model: 2110
Pulse Gen Serial Number: 7316617

## 2015-07-09 ENCOUNTER — Encounter: Payer: Self-pay | Admitting: Cardiology

## 2015-09-22 DIAGNOSIS — G4700 Insomnia, unspecified: Secondary | ICD-10-CM | POA: Diagnosis not present

## 2015-09-22 DIAGNOSIS — I481 Persistent atrial fibrillation: Secondary | ICD-10-CM | POA: Diagnosis not present

## 2015-09-22 DIAGNOSIS — F0390 Unspecified dementia without behavioral disturbance: Secondary | ICD-10-CM | POA: Diagnosis not present

## 2015-09-22 DIAGNOSIS — E7800 Pure hypercholesterolemia, unspecified: Secondary | ICD-10-CM | POA: Diagnosis not present

## 2015-09-22 DIAGNOSIS — I10 Essential (primary) hypertension: Secondary | ICD-10-CM | POA: Diagnosis not present

## 2015-09-26 ENCOUNTER — Telehealth: Payer: Self-pay | Admitting: Cardiology

## 2015-09-26 ENCOUNTER — Encounter: Payer: Commercial Managed Care - HMO | Admitting: *Deleted

## 2015-09-26 NOTE — Telephone Encounter (Signed)
Spoke with pt and reminded pt of remote transmission that is due today. Pt verbalized understanding.   

## 2015-09-29 ENCOUNTER — Encounter: Payer: Self-pay | Admitting: Cardiology

## 2015-10-16 DIAGNOSIS — H401131 Primary open-angle glaucoma, bilateral, mild stage: Secondary | ICD-10-CM | POA: Diagnosis not present

## 2015-10-16 DIAGNOSIS — Z961 Presence of intraocular lens: Secondary | ICD-10-CM | POA: Diagnosis not present

## 2015-11-13 ENCOUNTER — Encounter: Payer: Self-pay | Admitting: Nurse Practitioner

## 2015-11-13 NOTE — Progress Notes (Signed)
Electrophysiology Office Note Date: 11/14/2015  ID:  ANECIA [name], DOB Sep 04, 1924, MRN [id number]  PCP: Jasmine Smolder, MD Primary Cardiologist: Jasmine [name] Electrophysiologist: Jasmine [name]  CC: Pacemaker follow-up  Jasmine [name] is a 82 y.o. female seen today for Dr. Caryl [name].  She presents today for routine electrophysiology followup.  Since last being seen in our clinic, the patient reports doing reasonably well. She has occasional LE edema that improves with elevation. She recently celebrated her birthday and went to the park.  She denies chest pain, palpitations, dyspnea, PND, orthopnea, nausea, vomiting, dizziness, syncope, weight gain, or early satiety.  Device History: STJ dual chamber PPM implanted 2013 for sinus node dysfunction   Past Medical History  Diagnosis Date  . GERD (gastroesophageal reflux disease)   . Insomnia   . Glaucoma   . HOCM (hypertrophic obstructive cardiomyopathy) ([place])     a. s/p septal myomectomy;  b. 06/2014 Echo: EF 50-55%, mild antsept HK, mild MR, mild MS/MR (bioprosth valve), mildly dil LA, mod TR, PASP 1mmHg.  Marland Kitchen Hyperlipidemia   . Non-obstructive coronary artery disease     a. 06/2014 NSTEMI/Cath: LM nl, LAD nl, LCX nl, RCA 10-59m, EF 45%.  . Chronic kidney disease (CKD), stage III (moderate)   . History of Severe Mitral Regurgitation     a. s/p MVR with pericardial tissue valve;  b. 06/2014 Mild Mitral Stenosis by echo.  . Diverticulosis of colon with hemorrhage 08/12/2012  . History of GI Bleed     a. prevents use of [place].  Marland Kitchen Asthma   . Essential hypertension   . Reflux esophagitis   . Pulmonary HTN ([place])     a. mild to moderate (PASP 37mmHg by echo 06/2014).  Marland Kitchen Aortic insufficiency     a. 06/2014 Echo: Mild AI.  Marland Kitchen Bradycardia     a. 10/2011 s/p SJM Accent DR DC PPM, ser #: [id number].  . Atrial fibrillation and flutter     a. atrial fibrillation s/p DCCV 06-2012;  b. now in chronic atrial fibrillation off anticoagulation due to GI bleed.  . Dementia     Past Surgical History  Procedure Laterality Date  . Cardiac catheterization    . Cardiac valve replacement  2007    MVR  . Myomectomy  2007    septal  . Tonsillectomy    . Appendectomy    . Cholecystectomy    . Tubal ligation    . Hernia repair      "in my stomach"  . Cataract extraction w/ intraocular lens  implant, bilateral    . Cardioversion  07/04/2012    Procedure: CARDIOVERSION;  Surgeon: Jasmine Margarita, MD;  Location: [place];  Service: Cardiovascular;  Laterality: N/A;  h/p in file drawer   . Colonoscopy    . Colon resection    . Colonoscopy N/A 08/13/2012    Procedure: COLONOSCOPY;  Surgeon: Jasmine Mayer, MD;  Location: [place];  Service: Endoscopy;  Laterality: N/A;  . Permanent pacemaker insertion N/A 10/07/2011    Procedure: PERMANENT PACEMAKER INSERTION;  Surgeon: Jasmine Sprang, MD;  Location: [facility] CATH LAB;  Service: Cardiovascular;  Laterality: N/A;  . Left heart catheterization with coronary angiogram N/A 06/11/2014    Procedure: LEFT HEART CATHETERIZATION WITH CORONARY ANGIOGRAM;  Surgeon: Jasmine Sine, MD;  Location: [facility] CATH LAB;  Service: Cardiovascular;  Laterality: N/A;  . Cardiac catheterization N/A 03/27/2015    Procedure: Left Heart Cath and Coronary Angiography;  Surgeon: Jasmine Man, MD;  Location: [place] CV LAB;  Service: Cardiovascular;  Laterality: N/A;    Current Outpatient Prescriptions  Medication Sig Dispense Refill  . acetaminophen (TYLENOL) 500 MG tablet Take 500 mg by mouth every 6 (six) hours as needed. For pain    . albuterol (PROVENTIL HFA;VENTOLIN HFA) 108 (90 BASE) MCG/ACT inhaler Inhale 2 puffs into the lungs every 4 (four) hours as needed for wheezing or shortness of breath. For shortness of breath    . budesonide (PULMICORT) 180 MCG/ACT inhaler Inhale 2 puffs into the lungs daily as needed (SOB/Wheezing).     . cholecalciferol (VITAMIN D) 400 UNITS TABS tablet Take 400 Units by mouth daily.    Marland Kitchen diltiazem (CARDIZEM CD)  120 MG 24 hr capsule Take 1 capsule (120 mg total) by mouth daily. 30 capsule 0  . dorzolamide-timolol (COSOPT) 22.3-6.8 MG/ML ophthalmic solution Place 1 drop into both eyes 2 (two) times daily.     . ferrous sulfate 325 (65 FE) MG tablet Take 1 tablet (325 mg total) by mouth daily with breakfast. 30 tablet 1  . fluticasone (FLONASE) 50 MCG/ACT nasal spray Place 1 spray into both nostrils daily as needed for allergies or rhinitis.     Marland Kitchen FOLIC ACID-VIT Q000111Q 123456 PO Take 1 tablet by mouth daily.    Marland Kitchen guaiFENesin (MUCINEX) 600 MG 12 hr tablet Take 600 mg by mouth 2 (two) times daily as needed for cough or to loosen phlegm.     . hydrocortisone cream 1 % Apply 1 application topically daily.    . memantine (NAMENDA) 10 MG tablet Take 10 mg by mouth 2 (two) times daily.    . metoprolol (LOPRESSOR) 50 MG tablet Take 1 tablet (50 mg total) by mouth 2 (two) times daily. 60 tablet 0  . Multiple Vitamins-Minerals (ALIVE WOMENS 50+ PO) Take by mouth daily.    . nitroGLYCERIN (NITROSTAT) 0.4 MG SL tablet Place 1 tablet (0.4 mg total) under the tongue every 5 (five) minutes as needed for chest pain. 10 tablet 2  . ondansetron (ZOFRAN) 4 MG tablet Take 1 tablet (4 mg total) by mouth every 8 (eight) hours as needed for nausea or vomiting. 20 tablet 0  . pantoprazole (PROTONIX) 40 MG tablet Take 1 tablet (40 mg total) by mouth 2 (two) times daily before a meal. 60 tablet 3  . Probiotic CAPS Take 1 capsule by mouth daily.     . Simethicone (GAS-X EXTRA STRENGTH) 125 MG CAPS Take 125 mg by mouth daily as needed (gas).     . vitamin E 400 UNIT capsule Take 400 Units by mouth daily.     No current facility-administered medications for this visit.    Allergies:   Cymbalta and Dexilant   Social History: Social History   Social History  . Marital Status: Widowed    Spouse Name: N/A  . Number of Children: 3  . Years of Education: N/A   Occupational History  . Not on file.   Social History Main Topics  .  Smoking status: Former Smoker    Types: Cigarettes    Quit date: 06/07/1974  . Smokeless tobacco: Former Systems developer    Types: Snuff    Quit date: 04/23/1975     Comment: 10/05/11 "  . Alcohol Use: No     Comment: 10/05/11 "used to drink; quit a long time ago"  . Drug Use: No  . Sexual Activity: No   Other Topics Concern  . Not on file   Social History  Narrative    Family History: Family History  Problem Relation Age of Onset  . CVA Mother   . Hypertension Mother   . CVA Brother   . Hypertension Brother      Review of Systems: All other systems reviewed and are otherwise negative except as noted above.   Physical Exam: VS:  BP 164/82 mmHg  Pulse 67  Ht 5' 8.5" (1.74 m)  Wt 160 lb (72.576 kg)  BMI 23.97 kg/m2 , BMI Body mass index is 23.97 kg/(m^2).  GEN- The patient is elderly appearing, alert and oriented x 3 today.   HEENT: normocephalic, atraumatic; sclera clear, conjunctiva pink; hearing intact; oropharynx clear; neck supple  Lungs- Clear to ausculation bilaterally, normal work of breathing.  No wheezes, rales, rhonchi Heart- Irregular rate and rhythm  GI- soft, non-tender, non-distended, bowel sounds present  Extremities- no clubbing, cyanosis, trace BLE edema; DP/PT/radial pulses 2+ bilaterally MS- no significant deformity or atrophy Skin- warm and dry, no rash or lesion; PPM pocket well healed Psych- euthymic mood, full affect Neuro- strength and sensation are intact  PPM Interrogation- reviewed in detail today,  See PACEART report  EKG:  EKG is not ordered today.  Recent Labs: 03/30/2015: Magnesium 2.5* 04/22/2015: ALT 19; BUN 10; Creatinine, Ser 1.07*; Hemoglobin 12.6; Platelets 198; Potassium 4.8; Sodium 139   Wt Readings from Last 3 Encounters:  11/14/15 160 lb (72.576 kg)  04/28/15 154 lb 9.6 oz (70.126 kg)  03/28/15 164 lb 7.4 oz (74.6 kg)     Other studies Reviewed: Additional studies/ records that were reviewed today include: Dr Olin Pia office  notes  Assessment and Plan:  1.  Tachy/brady Normal PPM function See Pace Art report No changes today  2.  Permanent atrial fibrillation V rates controlled No OAC 2/2 prior GI bleed   3.  HTN Stable No change required today   Current medicines are reviewed at length with the patient today.   The patient does not have concerns regarding her medicines.  The following changes were made today:  none  Labs/ tests ordered today include: none  No orders of the defined types were placed in this encounter.     Disposition:   Follow up with Delilah Shan, Dr Jasmine [name] 1 year     Signed, Chanetta Marshall, NP 11/14/2015 12:12 PM  [address] [place] [address] [phone] (office) [phone] (fax)

## 2015-11-14 ENCOUNTER — Encounter: Payer: Self-pay | Admitting: Nurse Practitioner

## 2015-11-14 ENCOUNTER — Ambulatory Visit (INDEPENDENT_AMBULATORY_CARE_PROVIDER_SITE_OTHER): Payer: Commercial Managed Care - HMO | Admitting: Nurse Practitioner

## 2015-11-14 ENCOUNTER — Encounter: Payer: Self-pay | Admitting: Internal Medicine

## 2015-11-14 VITALS — BP 164/82 | HR 67 | Ht 68.5 in | Wt 160.0 lb

## 2015-11-14 DIAGNOSIS — I482 Chronic atrial fibrillation: Secondary | ICD-10-CM | POA: Diagnosis not present

## 2015-11-14 DIAGNOSIS — I495 Sick sinus syndrome: Secondary | ICD-10-CM

## 2015-11-14 DIAGNOSIS — I10 Essential (primary) hypertension: Secondary | ICD-10-CM | POA: Diagnosis not present

## 2015-11-14 DIAGNOSIS — I4821 Permanent atrial fibrillation: Secondary | ICD-10-CM

## 2015-11-14 LAB — CUP PACEART INCLINIC DEVICE CHECK
Implantable Lead Implant Date: 20130502
Implantable Lead Location: 753860
Implantable Lead Model: 1948
MDC IDC LEAD IMPLANT DT: 20130502
MDC IDC LEAD LOCATION: 753859
MDC IDC SESS DTM: 20170609131403
Pulse Gen Model: 2110
Pulse Gen Serial Number: 7316617

## 2015-11-14 NOTE — Patient Instructions (Signed)
Medication Instructions:   Your physician recommends that you continue on your current medications as directed. Please refer to the Current Medication list given to you today.   If you need a refill on your cardiac medications before your next appointment, please call your pharmacy.  Labwork:  NONE ORDER TODAY   Testing/Procedures:  NONE ORDER TODAY    Follow-Up:   Your physician wants you to follow-up in: [place] will receive a reminder letter in the mail two months in advance. If you don't receive a letter, please call our office to schedule the follow-up appointment.  Remote monitoring is used to monitor your Pacemaker of ICD from home. This monitoring reduces the number of office visits required to check your device to one time per year. It allows [place] to keep an eye on the functioning of your device to ensure it is working properly. You are scheduled for a device check from home on .02/13/2016..You may send your transmission at any time that day. If you have a wireless device, the transmission will be sent automatically. After your physician reviews your transmission, you will receive a postcard with your next transmission date.    Any Other Special Instructions Will Be Listed Below (If Applicable).

## 2016-01-19 DIAGNOSIS — H401131 Primary open-angle glaucoma, bilateral, mild stage: Secondary | ICD-10-CM | POA: Diagnosis not present

## 2016-03-22 DIAGNOSIS — I421 Obstructive hypertrophic cardiomyopathy: Secondary | ICD-10-CM | POA: Diagnosis not present

## 2016-03-22 DIAGNOSIS — I10 Essential (primary) hypertension: Secondary | ICD-10-CM | POA: Diagnosis not present

## 2016-03-22 DIAGNOSIS — G4700 Insomnia, unspecified: Secondary | ICD-10-CM | POA: Diagnosis not present

## 2016-03-22 DIAGNOSIS — I481 Persistent atrial fibrillation: Secondary | ICD-10-CM | POA: Diagnosis not present

## 2016-03-22 DIAGNOSIS — E7800 Pure hypercholesterolemia, unspecified: Secondary | ICD-10-CM | POA: Diagnosis not present

## 2016-03-22 DIAGNOSIS — N183 Chronic kidney disease, stage 3 (moderate): Secondary | ICD-10-CM | POA: Diagnosis not present

## 2016-03-22 DIAGNOSIS — Z0000 Encounter for general adult medical examination without abnormal findings: Secondary | ICD-10-CM | POA: Diagnosis not present

## 2016-03-22 DIAGNOSIS — Z23 Encounter for immunization: Secondary | ICD-10-CM | POA: Diagnosis not present

## 2016-03-22 DIAGNOSIS — G3184 Mild cognitive impairment, so stated: Secondary | ICD-10-CM | POA: Diagnosis not present

## 2016-08-12 DIAGNOSIS — H401131 Primary open-angle glaucoma, bilateral, mild stage: Secondary | ICD-10-CM | POA: Diagnosis not present

## 2016-09-15 DIAGNOSIS — G4700 Insomnia, unspecified: Secondary | ICD-10-CM | POA: Diagnosis not present

## 2016-09-15 DIAGNOSIS — I10 Essential (primary) hypertension: Secondary | ICD-10-CM | POA: Diagnosis not present

## 2016-09-15 DIAGNOSIS — K219 Gastro-esophageal reflux disease without esophagitis: Secondary | ICD-10-CM | POA: Diagnosis not present

## 2016-09-15 DIAGNOSIS — I481 Persistent atrial fibrillation: Secondary | ICD-10-CM | POA: Diagnosis not present

## 2016-09-15 DIAGNOSIS — E7800 Pure hypercholesterolemia, unspecified: Secondary | ICD-10-CM | POA: Diagnosis not present

## 2016-09-15 DIAGNOSIS — J45909 Unspecified asthma, uncomplicated: Secondary | ICD-10-CM | POA: Diagnosis not present

## 2016-09-15 DIAGNOSIS — I421 Obstructive hypertrophic cardiomyopathy: Secondary | ICD-10-CM | POA: Diagnosis not present

## 2016-09-15 DIAGNOSIS — F0390 Unspecified dementia without behavioral disturbance: Secondary | ICD-10-CM | POA: Diagnosis not present

## 2016-11-04 DIAGNOSIS — I739 Peripheral vascular disease, unspecified: Secondary | ICD-10-CM | POA: Diagnosis not present

## 2016-11-04 DIAGNOSIS — B351 Tinea unguium: Secondary | ICD-10-CM | POA: Diagnosis not present

## 2016-11-04 DIAGNOSIS — M79671 Pain in right foot: Secondary | ICD-10-CM | POA: Diagnosis not present

## 2016-11-04 DIAGNOSIS — M79672 Pain in left foot: Secondary | ICD-10-CM | POA: Diagnosis not present

## 2016-11-04 DIAGNOSIS — L603 Nail dystrophy: Secondary | ICD-10-CM | POA: Diagnosis not present

## 2016-12-06 DIAGNOSIS — H524 Presbyopia: Secondary | ICD-10-CM | POA: Diagnosis not present

## 2016-12-06 DIAGNOSIS — Z961 Presence of intraocular lens: Secondary | ICD-10-CM | POA: Diagnosis not present

## 2016-12-06 DIAGNOSIS — H5213 Myopia, bilateral: Secondary | ICD-10-CM | POA: Diagnosis not present

## 2016-12-06 DIAGNOSIS — H401131 Primary open-angle glaucoma, bilateral, mild stage: Secondary | ICD-10-CM | POA: Diagnosis not present

## 2016-12-27 ENCOUNTER — Ambulatory Visit (INDEPENDENT_AMBULATORY_CARE_PROVIDER_SITE_OTHER): Payer: Medicare HMO | Admitting: *Deleted

## 2016-12-27 DIAGNOSIS — I482 Chronic atrial fibrillation: Secondary | ICD-10-CM | POA: Diagnosis not present

## 2016-12-27 DIAGNOSIS — I4821 Permanent atrial fibrillation: Secondary | ICD-10-CM

## 2016-12-27 DIAGNOSIS — Z950 Presence of cardiac pacemaker: Secondary | ICD-10-CM

## 2016-12-27 LAB — CUP PACEART INCLINIC DEVICE CHECK
Brady Statistic RV Percent Paced: 70 %
Implantable Lead Implant Date: 20130502
Implantable Lead Implant Date: 20130502
Implantable Lead Location: 753860
Lead Channel Impedance Value: 687.5 Ohm
Lead Channel Pacing Threshold Amplitude: 1 V
Lead Channel Sensing Intrinsic Amplitude: 12 mV
Lead Channel Setting Pacing Pulse Width: 0.4 ms
MDC IDC LEAD LOCATION: 753859
MDC IDC MSMT BATTERY REMAINING LONGEVITY: 128 mo
MDC IDC MSMT BATTERY VOLTAGE: 2.95 V
MDC IDC MSMT LEADCHNL RV PACING THRESHOLD PULSEWIDTH: 0.4 ms
MDC IDC PG IMPLANT DT: 20130502
MDC IDC SESS DTM: 20180723140422
MDC IDC SET LEADCHNL RV PACING AMPLITUDE: 2.5 V
MDC IDC SET LEADCHNL RV SENSING SENSITIVITY: 2 mV
MDC IDC STAT BRADY RA PERCENT PACED: 0 %
Pulse Gen Model: 2110
Pulse Gen Serial Number: 7316617

## 2016-12-27 NOTE — Progress Notes (Signed)
  Pacemaker check in clinic. Normal device function. Thresholds, sensing, impedances consistent with previous measurements. Device programmed to maximize longevity. No high ventricular rates noted. Device programmed at appropriate safety margins. Histogram distribution appropriate for patient activity level. Device programmed to optimize intrinsic conduction. Estimated longevity 10.7-11.1 years. Patient enrolled in remote follow-up, new [place] monitor and cell adapter paired and given to patient today. Patient education completed. ROV with SK on 04/01/17.

## 2017-04-01 ENCOUNTER — Encounter: Payer: Self-pay | Admitting: Internal Medicine

## 2017-05-09 DIAGNOSIS — Z23 Encounter for immunization: Secondary | ICD-10-CM | POA: Diagnosis not present

## 2017-05-09 DIAGNOSIS — F0390 Unspecified dementia without behavioral disturbance: Secondary | ICD-10-CM | POA: Diagnosis not present

## 2017-05-09 DIAGNOSIS — I421 Obstructive hypertrophic cardiomyopathy: Secondary | ICD-10-CM | POA: Diagnosis not present

## 2017-05-09 DIAGNOSIS — E7800 Pure hypercholesterolemia, unspecified: Secondary | ICD-10-CM | POA: Diagnosis not present

## 2017-05-09 DIAGNOSIS — I10 Essential (primary) hypertension: Secondary | ICD-10-CM | POA: Diagnosis not present

## 2017-05-23 NOTE — Progress Notes (Signed)
Electrophysiology Office Note Date: 05/24/2017  ID:  Jasmine [name], DOB 1924-12-31, MRN [id number]  PCP: [name], MD Primary Cardiologist: Radford Pax Electrophysiologist: Caryl Comes  CC: Pacemaker follow-up  Jasmine [name] is a 83 y.o. female seen today for Dr. Caryl Comes.  She presents today for routine electrophysiology followup.  Since last being seen in our clinic, the patient reports doing reasonably well. She remains active at home.  She denies exertional chest pain or shortness of breath with activity.  She denies chest pain, palpitations, dyspnea, PND, orthopnea, nausea, vomiting, dizziness, syncope, weight gain, or early satiety.  Device History: STJ dual chamber PPM implanted 2013 for sinus node dysfunction   Past Medical History:  Diagnosis Date  . Aortic insufficiency    a. 06/2014 Echo: Mild AI.  Marland Kitchen Asthma   . Atrial fibrillation and flutter    a. atrial fibrillation s/p DCCV 06-2012;  b. now in chronic atrial fibrillation off anticoagulation due to GI bleed.  . Bradycardia    a. 10/2011 s/p SJM Accent DR DC PPM, ser #: [id number].  Marland Kitchen Chronic kidney disease (CKD), stage III (moderate) (HCC)   . Dementia   . Diverticulosis of colon with hemorrhage 08/12/2012  . Essential hypertension   . GERD (gastroesophageal reflux disease)   . Glaucoma   . History of GI Bleed    a. prevents use of [place].  Marland Kitchen History of Severe Mitral Regurgitation    a. s/p MVR with pericardial tissue valve;  b. 06/2014 Mild Mitral Stenosis by echo.  Marland Kitchen HOCM (hypertrophic obstructive cardiomyopathy) ([place])    a. s/p septal myomectomy;  b. 06/2014 Echo: EF 50-55%, mild antsept HK, mild MR, mild MS/MR (bioprosth valve), mildly dil LA, mod TR, PASP 56mmHg.  Marland Kitchen Hyperlipidemia   . Insomnia   . Non-obstructive coronary artery disease    a. 06/2014 NSTEMI/Cath: LM nl, LAD nl, LCX nl, RCA 10-47m, EF 45%.  . Pulmonary HTN ([place])    a. mild to moderate (PASP 62mmHg by echo 06/2014).  . Reflux esophagitis    Past Surgical  History:  Procedure Laterality Date  . APPENDECTOMY    . CARDIAC CATHETERIZATION    . CARDIAC CATHETERIZATION N/A 03/27/2015   Procedure: Left Heart Cath and Coronary Angiography;  Surgeon: Leonie Man, MD;  Location: [facility] [facility];  Service: Cardiovascular;  Laterality: N/A;  . CARDIAC VALVE REPLACEMENT  2007   MVR  . CARDIOVERSION  07/04/2012   Procedure: CARDIOVERSION;  Surgeon: Sueanne Margarita, MD;  Location: [facility];  Service: Cardiovascular;  Laterality: N/A;  h/p in file drawer   . CATARACT EXTRACTION W/ INTRAOCULAR LENS  IMPLANT, BILATERAL    . CHOLECYSTECTOMY    . COLON RESECTION    . COLONOSCOPY    . COLONOSCOPY N/A 08/13/2012   Procedure: COLONOSCOPY;  Surgeon: Gatha Mayer, MD;  Location: [facility];  Service: Endoscopy;  Laterality: N/A;  . HERNIA REPAIR     "in my stomach"  . LEFT HEART CATHETERIZATION WITH CORONARY ANGIOGRAM N/A 06/11/2014   Procedure: LEFT HEART CATHETERIZATION WITH CORONARY ANGIOGRAM;  Surgeon: Troy Sine, MD;  Location[facility] CATH LAB;  Service: Cardiovascular;  Laterality: N/A;  . MYOMECTOMY  2007   septal  . PERMANENT PACEMAKER INSERTION N/A 10/07/2011   Procedure: PERMANENT PACEMAKER INSERTION;  Surgeon: Deboraha Sprang, MD;  Location: [facility] CATH LAB;  Service: Cardiovascular;  Laterality: N/A;  . TONSILLECTOMY    . TUBAL LIGATION      Current Outpatient Medications  Medication Sig Dispense Refill  . acetaminophen (TYLENOL) 500 MG tablet Take 500 mg by mouth every 6 (six) hours as needed. For pain    . albuterol (PROVENTIL HFA;VENTOLIN HFA) 108 (90 BASE) MCG/ACT inhaler Inhale 2 puffs into the lungs every 4 (four) hours as needed for wheezing or shortness of breath. For shortness of breath    . budesonide (PULMICORT) 180 MCG/ACT inhaler Inhale 2 puffs into the lungs daily as needed (SOB/Wheezing).     . cholecalciferol (VITAMIN D) 400 UNITS TABS tablet Take 400 Units by mouth daily.    Marland Kitchen diltiazem (CARDIZEM CD) 120 MG 24 hr capsule Take  1 capsule (120 mg total) by mouth daily. 30 capsule 0  . dorzolamide-timolol (COSOPT) 22.3-6.8 MG/ML ophthalmic solution Place 1 drop into both eyes 2 (two) times daily.     . ferrous sulfate 325 (65 FE) MG tablet Take 1 tablet (325 mg total) by mouth daily with breakfast. 30 tablet 1  . fluticasone (FLONASE) 50 MCG/ACT nasal spray Place 1 spray into both nostrils daily as needed for allergies or rhinitis.     Marland Kitchen FOLIC ACID-VIT J6-OTL X72 PO Take 1 tablet by mouth daily.    Marland Kitchen guaiFENesin (MUCINEX) 600 MG 12 hr tablet Take 600 mg by mouth 2 (two) times daily as needed for cough or to loosen phlegm.     . hydrocortisone cream 1 % Apply 1 application topically daily.    . memantine (NAMENDA) 10 MG tablet Take 10 mg by mouth 2 (two) times daily.    . metoprolol (LOPRESSOR) 50 MG tablet Take 1 tablet (50 mg total) by mouth 2 (two) times daily. 60 tablet 0  . Multiple Vitamins-Minerals (ALIVE WOMENS 50+ PO) Take by mouth daily.    . nitroGLYCERIN (NITROSTAT) 0.4 MG SL tablet Place 1 tablet (0.4 mg total) under the tongue every 5 (five) minutes as needed for chest pain. 10 tablet 2  . ondansetron (ZOFRAN) 4 MG tablet Take 1 tablet (4 mg total) by mouth every 8 (eight) hours as needed for nausea or vomiting. 20 tablet 0  . pantoprazole (PROTONIX) 40 MG tablet Take 1 tablet (40 mg total) by mouth 2 (two) times daily before a meal. 60 tablet 3  . Probiotic CAPS Take 1 capsule by mouth daily.     . QUEtiapine (SEROQUEL) 25 MG tablet Take 25 mg by mouth at bedtime.    . Simethicone (GAS-X EXTRA STRENGTH) 125 MG CAPS Take 125 mg by mouth daily as needed (gas).     . vitamin E 400 UNIT capsule Take 400 Units by mouth daily.     No current facility-administered medications for this visit.     Allergies:   Cymbalta [duloxetine hcl] and Dexilant [dexlansoprazole]   Social History: Social History   Socioeconomic History  . Marital status: Widowed    Spouse name: Not on file  . Number of children: 3  .  Years of education: Not on file  . Highest education level: Not on file  Social Needs  . Financial resource strain: Not on file  . Food insecurity - worry: Not on file  . Food insecurity - inability: Not on file  . Transportation needs - medical: Not on file  . Transportation needs - non-medical: Not on file  Occupational History  . Not on file  Tobacco Use  . Smoking status: Former Smoker    Types: Cigarettes    Last attempt to quit: 06/07/1974    Years since quitting: 42.9  .  Smokeless tobacco: Former Systems developer    Types: Snuff    Quit date: 04/23/1975  . Tobacco comment: 10/05/11 "  Substance and Sexual Activity  . Alcohol use: No    Alcohol/week: 0.0 oz    Comment: 10/05/11 "used to drink; quit a long time ago"  . Drug use: No  . Sexual activity: No  Other Topics Concern  . Not on file  Social History Narrative  . Not on file    Family History: Family History  Problem Relation Age of Onset  . CVA Mother   . Hypertension Mother   . CVA Brother   . Hypertension Brother      Review of Systems: All other systems reviewed and are otherwise negative except as noted above.   Physical Exam: VS:  BP (!) 173/67   Pulse 67   Ht 5' 8.5" (1.74 m)   Wt 167 lb 3.2 oz (75.8 kg)   SpO2 95%   BMI 25.05 kg/m  , BMI Body mass index is 25.05 kg/m.  GEN- The patient is elderly appearing, alert and oriented x 3 today.   HEENT: normocephalic, atraumatic; sclera clear, conjunctiva pink; hearing intact; oropharynx clear; neck supple  Lungs- Clear to ausculation bilaterally, normal work of breathing.  No wheezes, rales, rhonchi Heart- Regular rate and rhythm (paced)  GI- soft, non-tender, non-distended, bowel sounds present  Extremities- no clubbing, cyanosis, no edema MS- no significant deformity or atrophy Skin- warm and dry, no rash or lesion; PPM pocket well healed Psych- euthymic mood, full affect Neuro- strength and sensation are intact  PPM Interrogation- reviewed in detail  today,  See PACEART report  EKG:  EKG is ordered today. EKG today demonstrates AF with V pacing  Recent Labs: No results found for requested labs within last 8760 hours.   Wt Readings from Last 3 Encounters:  05/24/17 167 lb 3.2 oz (75.8 kg)  11/14/15 160 lb (72.6 kg)  04/28/15 154 lb 9.6 oz (70.1 kg)     Other studies Reviewed: Additional studies/ records that were reviewed today include: Dr Olin Pia office notes  Assessment and Plan:  1.  Tachy/brady Normal PPM function See Pace Art report No changes today  2.  Permanent atrial fibrillation V rates controlled No OAC 2/2 prior GI bleed   3.  HTN BP elevated today in clinic. She has not had her morning meds SBP at home 140's No change today    Current medicines are reviewed at length with the patient today.   The patient does not have concerns regarding her medicines.  The following changes were made today:  none  Labs/ tests ordered today include: none  Orders Placed This Encounter  Procedures  . EKG 12-Lead     Disposition:   Follow up with Merlin, me in 1 year   Signed, Chanetta Marshall, NP 05/24/2017 9:24 AM  [address] [id number] [phone] (office) [phone] (fax)

## 2017-05-24 ENCOUNTER — Ambulatory Visit (INDEPENDENT_AMBULATORY_CARE_PROVIDER_SITE_OTHER): Payer: Medicare HMO | Admitting: Nurse Practitioner

## 2017-05-24 ENCOUNTER — Encounter: Payer: Self-pay | Admitting: Nurse Practitioner

## 2017-05-24 VITALS — BP 173/67 | HR 67 | Ht 68.5 in | Wt 167.2 lb

## 2017-05-24 DIAGNOSIS — I495 Sick sinus syndrome: Secondary | ICD-10-CM | POA: Diagnosis not present

## 2017-05-24 DIAGNOSIS — I482 Chronic atrial fibrillation: Secondary | ICD-10-CM

## 2017-05-24 DIAGNOSIS — I10 Essential (primary) hypertension: Secondary | ICD-10-CM

## 2017-05-24 DIAGNOSIS — I4821 Permanent atrial fibrillation: Secondary | ICD-10-CM

## 2017-05-24 NOTE — Patient Instructions (Addendum)
Medication Instructions:   Your physician recommends that you continue on your current medications as directed. Please refer to the Current Medication list given to you today.   If you need a refill on your cardiac medications before your next appointment, please call your pharmacy.  Labwork: NONE ORDERED  TODAY    Testing/Procedures: NONE ORDERED  TODAY    Follow-Up:  Your physician wants you to follow-up in: [place] will receive a reminder letter in the mail two months in advance. If you don't receive a letter, please call our office to schedule the follow-up appointment.   Remote monitoring is used to monitor your Pacemaker of ICD from home. This monitoring reduces the number of office visits required to check your device to one time per year. It allows [place] to keep an eye on the functioning of your device to ensure it is working properly. You are scheduled for a device check from home on .  08-22-17..You may send your transmission at any time that day. If you have a wireless device, the transmission will be sent automatically. After your physician reviews your transmission, you will receive a postcard with your next transmission date.      Any Other Special Instructions Will Be Listed Below (If Applicable).

## 2017-05-25 ENCOUNTER — Ambulatory Visit: Payer: Self-pay | Admitting: Nurse Practitioner

## 2017-07-14 ENCOUNTER — Ambulatory Visit
Admission: RE | Admit: 2017-07-14 | Discharge: 2017-07-14 | Disposition: A | Discharge status: Home / Self Care | Payer: Medicare HMO | Source: Ambulatory Visit | Attending: Family Medicine | Admitting: Family Medicine

## 2017-07-14 ENCOUNTER — Other Ambulatory Visit: Payer: Self-pay | Admitting: Family Medicine

## 2017-07-14 DIAGNOSIS — R05 Cough: Secondary | ICD-10-CM | POA: Diagnosis not present

## 2017-07-14 DIAGNOSIS — R0602 Shortness of breath: Secondary | ICD-10-CM

## 2017-07-14 DIAGNOSIS — R531 Weakness: Secondary | ICD-10-CM | POA: Diagnosis not present

## 2017-07-14 DIAGNOSIS — R509 Fever, unspecified: Secondary | ICD-10-CM | POA: Diagnosis not present

## 2017-07-14 DIAGNOSIS — M79606 Pain in leg, unspecified: Secondary | ICD-10-CM | POA: Diagnosis not present

## 2017-07-15 ENCOUNTER — Emergency Department (HOSPITAL_COMMUNITY): Payer: Medicare HMO

## 2017-07-15 ENCOUNTER — Other Ambulatory Visit: Payer: Self-pay

## 2017-07-15 ENCOUNTER — Encounter (HOSPITAL_COMMUNITY): Payer: Self-pay

## 2017-07-15 ENCOUNTER — Inpatient Hospital Stay (HOSPITAL_COMMUNITY)
Admission: EM | Admit: 2017-07-15 | Discharge: 2017-07-26 | DRG: 853 | Disposition: A | Discharge status: Home Health | Payer: Medicare HMO | Attending: Family Medicine | Admitting: Family Medicine

## 2017-07-15 DIAGNOSIS — L608 Other nail disorders: Secondary | ICD-10-CM | POA: Diagnosis not present

## 2017-07-15 DIAGNOSIS — Z7951 Long term (current) use of inhaled steroids: Secondary | ICD-10-CM

## 2017-07-15 DIAGNOSIS — K089 Disorder of teeth and supporting structures, unspecified: Secondary | ICD-10-CM | POA: Diagnosis not present

## 2017-07-15 DIAGNOSIS — J189 Pneumonia, unspecified organism: Secondary | ICD-10-CM | POA: Diagnosis not present

## 2017-07-15 DIAGNOSIS — I058 Other rheumatic mitral valve diseases: Secondary | ICD-10-CM | POA: Diagnosis not present

## 2017-07-15 DIAGNOSIS — A419 Sepsis, unspecified organism: Principal | ICD-10-CM | POA: Diagnosis present

## 2017-07-15 DIAGNOSIS — Z888 Allergy status to other drugs, medicaments and biological substances status: Secondary | ICD-10-CM

## 2017-07-15 DIAGNOSIS — I5033 Acute on chronic diastolic (congestive) heart failure: Secondary | ICD-10-CM | POA: Diagnosis present

## 2017-07-15 DIAGNOSIS — I059 Rheumatic mitral valve disease, unspecified: Secondary | ICD-10-CM

## 2017-07-15 DIAGNOSIS — K029 Dental caries, unspecified: Secondary | ICD-10-CM | POA: Diagnosis not present

## 2017-07-15 DIAGNOSIS — T827XXD Infection and inflammatory reaction due to other cardiac and vascular devices, implants and grafts, subsequent encounter: Secondary | ICD-10-CM | POA: Diagnosis not present

## 2017-07-15 DIAGNOSIS — R05 Cough: Secondary | ICD-10-CM | POA: Diagnosis not present

## 2017-07-15 DIAGNOSIS — N183 Chronic kidney disease, stage 3 unspecified: Secondary | ICD-10-CM | POA: Diagnosis present

## 2017-07-15 DIAGNOSIS — F329 Major depressive disorder, single episode, unspecified: Secondary | ICD-10-CM | POA: Diagnosis present

## 2017-07-15 DIAGNOSIS — R0989 Other specified symptoms and signs involving the circulatory and respiratory systems: Secondary | ICD-10-CM | POA: Diagnosis not present

## 2017-07-15 DIAGNOSIS — R0602 Shortness of breath: Secondary | ICD-10-CM

## 2017-07-15 DIAGNOSIS — Z8679 Personal history of other diseases of the circulatory system: Secondary | ICD-10-CM | POA: Diagnosis not present

## 2017-07-15 DIAGNOSIS — R739 Hyperglycemia, unspecified: Secondary | ICD-10-CM | POA: Diagnosis present

## 2017-07-15 DIAGNOSIS — K0889 Other specified disorders of teeth and supporting structures: Secondary | ICD-10-CM | POA: Diagnosis not present

## 2017-07-15 DIAGNOSIS — Z952 Presence of prosthetic heart valve: Secondary | ICD-10-CM | POA: Diagnosis not present

## 2017-07-15 DIAGNOSIS — K045 Chronic apical periodontitis: Secondary | ICD-10-CM | POA: Diagnosis present

## 2017-07-15 DIAGNOSIS — K0530 Chronic periodontitis, unspecified: Secondary | ICD-10-CM | POA: Diagnosis not present

## 2017-07-15 DIAGNOSIS — T826XXA Infection and inflammatory reaction due to cardiac valve prosthesis, initial encounter: Secondary | ICD-10-CM | POA: Diagnosis not present

## 2017-07-15 DIAGNOSIS — K219 Gastro-esophageal reflux disease without esophagitis: Secondary | ICD-10-CM | POA: Diagnosis present

## 2017-07-15 DIAGNOSIS — I421 Obstructive hypertrophic cardiomyopathy: Secondary | ICD-10-CM | POA: Diagnosis present

## 2017-07-15 DIAGNOSIS — I2510 Atherosclerotic heart disease of native coronary artery without angina pectoris: Secondary | ICD-10-CM | POA: Diagnosis not present

## 2017-07-15 DIAGNOSIS — H409 Unspecified glaucoma: Secondary | ICD-10-CM | POA: Diagnosis present

## 2017-07-15 DIAGNOSIS — K036 Deposits [accretions] on teeth: Secondary | ICD-10-CM | POA: Diagnosis not present

## 2017-07-15 DIAGNOSIS — I371 Nonrheumatic pulmonary valve insufficiency: Secondary | ICD-10-CM | POA: Diagnosis present

## 2017-07-15 DIAGNOSIS — B351 Tinea unguium: Secondary | ICD-10-CM | POA: Diagnosis not present

## 2017-07-15 DIAGNOSIS — I083 Combined rheumatic disorders of mitral, aortic and tricuspid valves: Secondary | ICD-10-CM | POA: Diagnosis present

## 2017-07-15 DIAGNOSIS — E876 Hypokalemia: Secondary | ICD-10-CM | POA: Diagnosis present

## 2017-07-15 DIAGNOSIS — I361 Nonrheumatic tricuspid (valve) insufficiency: Secondary | ICD-10-CM | POA: Diagnosis not present

## 2017-07-15 DIAGNOSIS — I252 Old myocardial infarction: Secondary | ICD-10-CM | POA: Diagnosis not present

## 2017-07-15 DIAGNOSIS — Z953 Presence of xenogenic heart valve: Secondary | ICD-10-CM | POA: Diagnosis not present

## 2017-07-15 DIAGNOSIS — I330 Acute and subacute infective endocarditis: Secondary | ICD-10-CM | POA: Diagnosis present

## 2017-07-15 DIAGNOSIS — Z961 Presence of intraocular lens: Secondary | ICD-10-CM | POA: Diagnosis present

## 2017-07-15 DIAGNOSIS — Z8249 Family history of ischemic heart disease and other diseases of the circulatory system: Secondary | ICD-10-CM

## 2017-07-15 DIAGNOSIS — R110 Nausea: Secondary | ICD-10-CM | POA: Diagnosis not present

## 2017-07-15 DIAGNOSIS — I2583 Coronary atherosclerosis due to lipid rich plaque: Secondary | ICD-10-CM | POA: Diagnosis not present

## 2017-07-15 DIAGNOSIS — Z9049 Acquired absence of other specified parts of digestive tract: Secondary | ICD-10-CM

## 2017-07-15 DIAGNOSIS — Z452 Encounter for adjustment and management of vascular access device: Secondary | ICD-10-CM | POA: Diagnosis not present

## 2017-07-15 DIAGNOSIS — I482 Chronic atrial fibrillation, unspecified: Secondary | ICD-10-CM | POA: Diagnosis present

## 2017-07-15 DIAGNOSIS — F0390 Unspecified dementia without behavioral disturbance: Secondary | ICD-10-CM | POA: Diagnosis present

## 2017-07-15 DIAGNOSIS — I2721 Secondary pulmonary arterial hypertension: Secondary | ICD-10-CM | POA: Diagnosis present

## 2017-07-15 DIAGNOSIS — Z95828 Presence of other vascular implants and grafts: Secondary | ICD-10-CM | POA: Diagnosis not present

## 2017-07-15 DIAGNOSIS — R531 Weakness: Secondary | ICD-10-CM | POA: Diagnosis not present

## 2017-07-15 DIAGNOSIS — T827XXA Infection and inflammatory reaction due to other cardiac and vascular devices, implants and grafts, initial encounter: Secondary | ICD-10-CM

## 2017-07-15 DIAGNOSIS — D631 Anemia in chronic kidney disease: Secondary | ICD-10-CM | POA: Diagnosis present

## 2017-07-15 DIAGNOSIS — I272 Pulmonary hypertension, unspecified: Secondary | ICD-10-CM | POA: Diagnosis present

## 2017-07-15 DIAGNOSIS — T82897A Other specified complication of cardiac prosthetic devices, implants and grafts, initial encounter: Secondary | ICD-10-CM | POA: Diagnosis not present

## 2017-07-15 DIAGNOSIS — R509 Fever, unspecified: Secondary | ICD-10-CM | POA: Diagnosis not present

## 2017-07-15 DIAGNOSIS — K083 Retained dental root: Secondary | ICD-10-CM | POA: Diagnosis present

## 2017-07-15 DIAGNOSIS — R0981 Nasal congestion: Secondary | ICD-10-CM | POA: Diagnosis not present

## 2017-07-15 DIAGNOSIS — J019 Acute sinusitis, unspecified: Secondary | ICD-10-CM | POA: Diagnosis not present

## 2017-07-15 DIAGNOSIS — I10 Essential (primary) hypertension: Secondary | ICD-10-CM | POA: Diagnosis not present

## 2017-07-15 DIAGNOSIS — G4700 Insomnia, unspecified: Secondary | ICD-10-CM | POA: Diagnosis present

## 2017-07-15 DIAGNOSIS — T826XXD Infection and inflammatory reaction due to cardiac valve prosthesis, subsequent encounter: Secondary | ICD-10-CM | POA: Diagnosis not present

## 2017-07-15 DIAGNOSIS — I509 Heart failure, unspecified: Secondary | ICD-10-CM | POA: Diagnosis not present

## 2017-07-15 DIAGNOSIS — Y831 Surgical operation with implant of artificial internal device as the cause of abnormal reaction of the patient, or of later complication, without mention of misadventure at the time of the procedure: Secondary | ICD-10-CM | POA: Diagnosis present

## 2017-07-15 DIAGNOSIS — Z950 Presence of cardiac pacemaker: Secondary | ICD-10-CM

## 2017-07-15 DIAGNOSIS — R011 Cardiac murmur, unspecified: Secondary | ICD-10-CM | POA: Diagnosis not present

## 2017-07-15 DIAGNOSIS — I340 Nonrheumatic mitral (valve) insufficiency: Secondary | ICD-10-CM | POA: Diagnosis not present

## 2017-07-15 DIAGNOSIS — J9601 Acute respiratory failure with hypoxia: Secondary | ICD-10-CM | POA: Diagnosis not present

## 2017-07-15 DIAGNOSIS — R404 Transient alteration of awareness: Secondary | ICD-10-CM | POA: Diagnosis not present

## 2017-07-15 DIAGNOSIS — E785 Hyperlipidemia, unspecified: Secondary | ICD-10-CM | POA: Diagnosis present

## 2017-07-15 DIAGNOSIS — I130 Hypertensive heart and chronic kidney disease with heart failure and stage 1 through stage 4 chronic kidney disease, or unspecified chronic kidney disease: Secondary | ICD-10-CM | POA: Diagnosis present

## 2017-07-15 DIAGNOSIS — Z87891 Personal history of nicotine dependence: Secondary | ICD-10-CM

## 2017-07-15 DIAGNOSIS — I38 Endocarditis, valve unspecified: Secondary | ICD-10-CM

## 2017-07-15 DIAGNOSIS — Z79899 Other long term (current) drug therapy: Secondary | ICD-10-CM

## 2017-07-15 LAB — COMPREHENSIVE METABOLIC PANEL
ALBUMIN: 2.9 g/dL — AB (ref 3.5–5.0)
ALK PHOS: 74 U/L (ref 38–126)
ALT: 18 U/L (ref 14–54)
AST: 25 U/L (ref 15–41)
Anion gap: 14 (ref 5–15)
BILIRUBIN TOTAL: 0.9 mg/dL (ref 0.3–1.2)
BUN: 13 mg/dL (ref 6–20)
CALCIUM: 8.7 mg/dL — AB (ref 8.9–10.3)
CO2: 19 mmol/L — AB (ref 22–32)
Chloride: 103 mmol/L (ref 101–111)
Creatinine, Ser: 1.08 mg/dL — ABNORMAL HIGH (ref 0.44–1.00)
GFR calc Af Amer: 51 mL/min — ABNORMAL LOW (ref >60)
GFR calc non Af Amer: 44 mL/min — ABNORMAL LOW (ref >60)
GLUCOSE: 97 mg/dL (ref 65–99)
Potassium: 4.1 mmol/L (ref 3.5–5.1)
SODIUM: 136 mmol/L (ref 135–145)
TOTAL PROTEIN: 7.3 g/dL (ref 6.5–8.1)

## 2017-07-15 LAB — CBC WITH DIFFERENTIAL/PLATELET
BASOS PCT: 0 %
Basophils Absolute: 0 10*3/uL (ref 0.0–0.1)
EOS ABS: 0.1 10*3/uL (ref 0.0–0.7)
Eosinophils Relative: 0 %
HEMATOCRIT: 30.1 % — AB (ref 36.0–46.0)
HEMOGLOBIN: 9.5 g/dL — AB (ref 12.0–15.0)
LYMPHS ABS: 1.3 10*3/uL (ref 0.7–4.0)
Lymphocytes Relative: 6 %
MCH: 28 pg (ref 26.0–34.0)
MCHC: 31.6 g/dL (ref 30.0–36.0)
MCV: 88.8 fL (ref 78.0–100.0)
MONO ABS: 1.1 10*3/uL — AB (ref 0.1–1.0)
MONOS PCT: 5 %
NEUTROS ABS: 19.7 10*3/uL — AB (ref 1.7–7.7)
NEUTROS PCT: 89 %
Platelets: 359 10*3/uL (ref 150–400)
RBC: 3.39 MIL/uL — ABNORMAL LOW (ref 3.87–5.11)
RDW: 14.2 % (ref 11.5–15.5)
WBC: 22.3 10*3/uL — ABNORMAL HIGH (ref 4.0–10.5)

## 2017-07-15 LAB — BRAIN NATRIURETIC PEPTIDE: B Natriuretic Peptide: 366.4 pg/mL — ABNORMAL HIGH (ref 0.0–100.0)

## 2017-07-15 LAB — MAGNESIUM: Magnesium: 1.9 mg/dL (ref 1.7–2.4)

## 2017-07-15 LAB — I-STAT CG4 LACTIC ACID, ED
LACTIC ACID, VENOUS: 1.33 mmol/L (ref 0.5–1.9)
LACTIC ACID, VENOUS: 0.95 mmol/L (ref 0.5–1.9)

## 2017-07-15 LAB — TSH: TSH: 2.314 u[IU]/mL (ref 0.350–4.500)

## 2017-07-15 LAB — INFLUENZA PANEL BY PCR (TYPE A & B)
INFLAPCR: NEGATIVE
INFLBPCR: NEGATIVE

## 2017-07-15 LAB — TROPONIN I
Troponin I: 0.05 ng/mL (ref <0.03)
Troponin I: 0.03 ng/mL (ref <0.03)

## 2017-07-15 MED ORDER — DORZOLAMIDE HCL-TIMOLOL MAL 2-0.5 % OP SOLN
1.0000 [drp] | Freq: Two times a day (BID) | OPHTHALMIC | Status: DC
  Administered 2017-07-16 – 2017-07-26 (×20): 1 [drp] via OPHTHALMIC
  Filled 2017-07-15: qty 10

## 2017-07-15 MED ORDER — NITROGLYCERIN 0.4 MG SL SUBL: 0.4000 mg | SUBLINGUAL_TABLET | SUBLINGUAL | Status: DC | PRN

## 2017-07-15 MED ORDER — MEMANTINE HCL 10 MG PO TABS
10.0000 mg | ORAL_TABLET | Freq: Two times a day (BID) | ORAL | Status: DC
  Administered 2017-07-15 – 2017-07-26 (×21): 10 mg via ORAL
  Filled 2017-07-15 (×21): qty 1

## 2017-07-15 MED ORDER — ASPIRIN 81 MG PO CHEW
324.0000 mg | CHEWABLE_TABLET | Freq: Once | ORAL | Status: AC
Start: 2017-07-15 — End: 2017-07-15
  Administered 2017-07-15: 324 mg via ORAL
  Filled 2017-07-15: qty 4

## 2017-07-15 MED ORDER — SODIUM CHLORIDE 0.9 % IV BOLUS (SEPSIS)
500.0000 mL | Freq: Once | INTRAVENOUS | Status: AC
  Administered 2017-07-15: 500 mL via INTRAVENOUS

## 2017-07-15 MED ORDER — DEXTROSE 5 % IV SOLN
1.0000 g | INTRAVENOUS | Status: DC
  Administered 2017-07-16 – 2017-07-17 (×2): 1 g via INTRAVENOUS
  Filled 2017-07-15 (×2): qty 10

## 2017-07-15 MED ORDER — PANTOPRAZOLE SODIUM 40 MG PO TBEC
40.0000 mg | DELAYED_RELEASE_TABLET | Freq: Two times a day (BID) | ORAL | Status: DC
  Administered 2017-07-16 – 2017-07-26 (×20): 40 mg via ORAL
  Filled 2017-07-15 (×21): qty 1

## 2017-07-15 MED ORDER — BUDESONIDE 0.25 MG/2ML IN SUSP
0.2500 mg | Freq: Two times a day (BID) | RESPIRATORY_TRACT | Status: DC
  Administered 2017-07-16 – 2017-07-26 (×20): 0.25 mg via RESPIRATORY_TRACT
  Filled 2017-07-15 (×21): qty 2

## 2017-07-15 MED ORDER — ONDANSETRON HCL 4 MG PO TABS: 4.0000 mg | ORAL_TABLET | Freq: Three times a day (TID) | ORAL | Status: DC | PRN

## 2017-07-15 MED ORDER — ACETAMINOPHEN 650 MG RE SUPP: 650.0000 mg | Freq: Four times a day (QID) | RECTAL | Status: DC | PRN

## 2017-07-15 MED ORDER — ACETAMINOPHEN 325 MG PO TABS
650.0000 mg | ORAL_TABLET | Freq: Four times a day (QID) | ORAL | Status: DC | PRN
  Administered 2017-07-21: 650 mg via ORAL
  Filled 2017-07-15: qty 2

## 2017-07-15 MED ORDER — BRIMONIDINE TARTRATE-TIMOLOL 0.2-0.5 % OP SOLN
1.0000 [drp] | Freq: Every day | OPHTHALMIC | Status: DC
  Filled 2017-07-15: qty 5

## 2017-07-15 MED ORDER — ONDANSETRON HCL 4 MG/2ML IJ SOLN
4.0000 mg | Freq: Four times a day (QID) | INTRAMUSCULAR | Status: DC | PRN
  Administered 2017-07-21 – 2017-07-24 (×4): 4 mg via INTRAVENOUS
  Filled 2017-07-15 (×4): qty 2

## 2017-07-15 MED ORDER — VITAMIN E 180 MG (400 UNIT) PO CAPS
400.0000 [IU] | ORAL_CAPSULE | Freq: Every day | ORAL | Status: DC
Start: 2017-07-16 — End: 2017-07-26
  Administered 2017-07-16 – 2017-07-26 (×10): 400 [IU] via ORAL
  Filled 2017-07-15 (×11): qty 1

## 2017-07-15 MED ORDER — METOPROLOL TARTRATE 50 MG PO TABS
50.0000 mg | ORAL_TABLET | Freq: Two times a day (BID) | ORAL | Status: DC
  Administered 2017-07-15 – 2017-07-26 (×22): 50 mg via ORAL
  Filled 2017-07-15 (×22): qty 1

## 2017-07-15 MED ORDER — CEFTRIAXONE SODIUM 1 G IJ SOLR
1.0000 g | Freq: Once | INTRAMUSCULAR | Status: AC
  Administered 2017-07-15: 1 g via INTRAVENOUS
  Filled 2017-07-15: qty 10

## 2017-07-15 MED ORDER — QUETIAPINE FUMARATE 25 MG PO TABS
25.0000 mg | ORAL_TABLET | Freq: Every day | ORAL | Status: DC
  Administered 2017-07-15 – 2017-07-25 (×11): 25 mg via ORAL
  Filled 2017-07-15 (×11): qty 1

## 2017-07-15 MED ORDER — WARFARIN - PHARMACIST DOSING INPATIENT: Freq: Every day | Status: DC

## 2017-07-15 MED ORDER — ALBUTEROL SULFATE (2.5 MG/3ML) 0.083% IN NEBU
2.5000 mg | INHALATION_SOLUTION | Freq: Once | RESPIRATORY_TRACT | Status: AC
  Administered 2017-07-15: 2.5 mg via RESPIRATORY_TRACT
  Filled 2017-07-15: qty 3

## 2017-07-15 MED ORDER — ACETAMINOPHEN 325 MG PO TABS
650.0000 mg | ORAL_TABLET | Freq: Once | ORAL | Status: AC
  Administered 2017-07-15: 650 mg via ORAL
  Filled 2017-07-15: qty 2

## 2017-07-15 MED ORDER — WARFARIN SODIUM 2.5 MG PO TABS
2.5000 mg | ORAL_TABLET | Freq: Once | ORAL | Status: AC
  Administered 2017-07-15: 2.5 mg via ORAL
  Filled 2017-07-15: qty 1

## 2017-07-15 MED ORDER — DEXTROSE 5 % IV SOLN
500.0000 mg | Freq: Once | INTRAVENOUS | Status: AC
  Administered 2017-07-15: 500 mg via INTRAVENOUS
  Filled 2017-07-15: qty 500

## 2017-07-15 MED ORDER — DEXTROSE 5 % IV SOLN
500.0000 mg | INTRAVENOUS | Status: DC
  Administered 2017-07-16: 500 mg via INTRAVENOUS
  Filled 2017-07-15 (×2): qty 500

## 2017-07-15 MED ORDER — FLUTICASONE PROPIONATE 50 MCG/ACT NA SUSP: 1.0000 | Freq: Every day | NASAL | Status: DC | PRN

## 2017-07-15 MED ORDER — FERROUS SULFATE 325 (65 FE) MG PO TABS
325.0000 mg | ORAL_TABLET | Freq: Every day | ORAL | Status: DC
  Administered 2017-07-16 – 2017-07-22 (×6): 325 mg via ORAL
  Filled 2017-07-15 (×7): qty 1

## 2017-07-15 MED ORDER — ONDANSETRON HCL 4 MG PO TABS: 4.0000 mg | ORAL_TABLET | Freq: Four times a day (QID) | ORAL | Status: DC | PRN

## 2017-07-15 MED ORDER — DILTIAZEM HCL ER COATED BEADS 120 MG PO CP24
120.0000 mg | ORAL_CAPSULE | Freq: Every day | ORAL | Status: DC
  Administered 2017-07-16 – 2017-07-26 (×11): 120 mg via ORAL
  Filled 2017-07-15 (×11): qty 1

## 2017-07-15 MED ORDER — GUAIFENESIN ER 600 MG PO TB12: 600.0000 mg | ORAL_TABLET | Freq: Two times a day (BID) | ORAL | Status: DC | PRN

## 2017-07-15 NOTE — ED Notes (Signed)
Pt states her PCP did flu swab (nagative), chest x-ray (negative) yesterday. Given abx "just in case it is bronchitis".

## 2017-07-15 NOTE — ED Notes (Signed)
Phlebotomy informed that second set of blood cultures are still needed. Pt gone to xray at this time, phlebotomy to collect second set when pt returns

## 2017-07-15 NOTE — Progress Notes (Signed)
ANTICOAGULATION CONSULT NOTE - Initial Consult  Pharmacy Consult for Coumadin Indication: atrial fibrillation  Allergies  Allergen Reactions  . Cymbalta [Duloxetine Hcl] Diarrhea and Other (See Comments)    weakness  . Dexilant [Dexlansoprazole] Diarrhea and Other (See Comments)    Bloating also    Patient Measurements: Height: 5\' 9"  (175.3 cm) Weight: 167 lb (75.8 kg) IBW/kg (Calculated) : 66.2  Assessment: 84 yo F presents with weakness. Hx of Afib and had been on Coumadin but has been off for several years because of hx of GI bleed. Pharmacy consulted to restart Coumadin. Hgb 9.5, plts wnl.  Goal of Therapy:  Heparin level 0.3-0.7 units/ml Monitor platelets by anticoagulation protocol: Yes   Plan:  Give Coumadin 2.5mg  PO x 1 tonight Monitor daily INR, CBC, s/s of bleed F/U need to bridge?  Elenor Quinones, PharmD, BCPS Clinical Pharmacist Pager [phone] 07/15/2017 9:24 PM

## 2017-07-15 NOTE — H&P (Addendum)
History and Physical    Jasmine [name] KDX:[id number] DOB: 11-13-1924 DOA: 07/15/2017  PCP: [name], MD  Patient coming from: Home.  Chief Complaint: Shortness of breath.  HPI: Jasmine [name] is a 84 y.o. female with history of sick sinus node status post pacemaker placement, chronic kidney disease stage III, chronic anemia, dementia was brought to the ER after patient had persistent shortness of breath.  Patient has been having persistent shortness of breath over the last 2 weeks with some cough and exertional symptoms.  Off and on and has had some chest pain.  Patient had gone to her PCP yesterday and was prescribed antibiotic for possible pneumonia.  In the office at the PCPs patient influenza was negative per the patient.  Despite which patient symptoms did not improve and came to the ER.  ED Course: In the ER patient appears to be short of breath and chest x-ray did not show any definite infiltrates.  Patient is mildly febrile with labs showing leukocytosis.  Blood cultures were obtained and patient was started on empiric antibiotics for quite pneumonia.  Repeat influenza PCR was sent.  Patient's BNP also is mildly elevated.  Patient symptoms may be a combination of pneumonia and CHF.  Review of Systems: As per HPI, rest all negative.   Past Medical History:  Diagnosis Date  . Aortic insufficiency    a. 06/2014 Echo: Mild AI.  Marland Kitchen Asthma   . Atrial fibrillation and flutter    a. atrial fibrillation s/p DCCV 06-2012;  b. now in chronic atrial fibrillation off anticoagulation due to GI bleed.  . Bradycardia    a. 10/2011 s/p SJM Accent DR DC PPM, ser #: [id number].  Marland Kitchen Chronic kidney disease (CKD), stage III (moderate) (HCC)   . Dementia   . Diverticulosis of colon with hemorrhage 08/12/2012  . Essential hypertension   . GERD (gastroesophageal reflux disease)   . Glaucoma   . History of GI Bleed    a. prevents use of [place].  Marland Kitchen History of Severe Mitral Regurgitation    a. s/p MVR with  pericardial tissue valve;  b. 06/2014 Mild Mitral Stenosis by echo.  Marland Kitchen HOCM (hypertrophic obstructive cardiomyopathy) ([place])    a. s/p septal myomectomy;  b. 06/2014 Echo: EF 50-55%, mild antsept HK, mild MR, mild MS/MR (bioprosth valve), mildly dil LA, mod TR, PASP 57mmHg.  Marland Kitchen Hyperlipidemia   . Insomnia   . Non-obstructive coronary artery disease    a. 06/2014 NSTEMI/Cath: LM nl, LAD nl, LCX nl, RCA 10-32m, EF 45%.  . Pulmonary HTN ([place])    a. mild to moderate (PASP 63mmHg by echo 06/2014).  . Reflux esophagitis     Past Surgical History:  Procedure Laterality Date  . APPENDECTOMY    . CARDIAC CATHETERIZATION    . CARDIAC CATHETERIZATION N/A 03/27/2015   Procedure: Left Heart Cath and Coronary Angiography;  Surgeon: Leonie Man, MD;  Location: [facility];  Service: Cardiovascular;  Laterality: N/A;  . CARDIAC VALVE REPLACEMENT  2007   MVR  . CARDIOVERSION  07/04/2012   Procedure: CARDIOVERSION;  Surgeon: Sueanne Margarita, MD;  Location: [facility];  Service: Cardiovascular;  Laterality: N/A;  h/p in file drawer   . CATARACT EXTRACTION W/ INTRAOCULAR LENS  IMPLANT, BILATERAL    . CHOLECYSTECTOMY    . COLON RESECTION    . COLONOSCOPY    . COLONOSCOPY N/A 08/13/2012   Procedure: COLONOSCOPY;  Surgeon: Gatha Mayer, MD;  Location: [facility]  ENDOSCOPY;  Service: Endoscopy;  Laterality: N/A;  . HERNIA REPAIR     "in my stomach"  . LEFT HEART CATHETERIZATION WITH CORONARY ANGIOGRAM N/A 06/11/2014   Procedure: LEFT HEART CATHETERIZATION WITH CORONARY ANGIOGRAM;  Surgeon: Troy Sine, MD;  Location[facility] CATH LAB;  Service: Cardiovascular;  Laterality: N/A;  . MYOMECTOMY  2007   septal  . PERMANENT PACEMAKER INSERTION N/A 10/07/2011   Procedure: PERMANENT PACEMAKER INSERTION;  Surgeon: Deboraha Sprang, MD;  Location: [facility] CATH LAB;  Service: Cardiovascular;  Laterality: N/A;  . TONSILLECTOMY    . TUBAL LIGATION       reports that she quit smoking about 43 years ago. Her smoking use included  cigarettes. She quit smokeless tobacco use about 42 years ago. Her smokeless tobacco use included snuff. She reports that she does not drink alcohol or use drugs.  Allergies  Allergen Reactions  . Cymbalta [Duloxetine Hcl] Diarrhea and Other (See Comments)    weakness  . Dexilant [Dexlansoprazole] Diarrhea and Other (See Comments)    Bloating also    Family History  Problem Relation Age of Onset  . CVA Mother   . Hypertension Mother   . CVA Brother   . Hypertension Brother     Prior to Admission medications   Medication Sig Start Date End Date Taking? Authorizing Provider  acetaminophen (TYLENOL) 500 MG tablet Take 500 mg by mouth every 6 (six) hours as needed. For pain   Yes [provider]  albuterol (PROVENTIL HFA;VENTOLIN HFA) 108 (90 BASE) MCG/ACT inhaler Inhale 2 puffs into the lungs every 4 (four) hours as needed for wheezing or shortness of breath. For shortness of breath   Yes [provider]  budesonide (PULMICORT) 180 MCG/ACT inhaler Inhale 2 puffs into the lungs daily as needed (SOB/Wheezing).    Yes [provider]  cholecalciferol (VITAMIN D) 400 UNITS TABS tablet Take 400 Units by mouth daily.   Yes [provider]  COMBIGAN 0.2-0.5 % ophthalmic solution Place 1 drop into both eyes daily. 06/21/17  Yes [provider]  diltiazem (CARDIZEM CD) 120 MG 24 hr capsule Take 1 capsule (120 mg total) by mouth daily. 04/02/15  Yes Vann, Jessica U, DO  dorzolamide-timolol (COSOPT) 22.3-6.8 MG/ML ophthalmic solution Place 1 drop into both eyes 2 (two) times daily.  07/19/14  Yes [provider]  ferrous sulfate 325 (65 FE) MG tablet Take 1 tablet (325 mg total) by mouth daily with breakfast. 10/20/14  Yes Delfina Redwood, MD  fluticasone (FLONASE) 50 MCG/ACT nasal spray Place 1 spray into both nostrils daily as needed for allergies or rhinitis.    Yes [provider]  FOLIC ACID-VIT I3-KVQ Q59 PO Take 1 tablet by mouth  daily.   Yes [provider]  guaiFENesin (MUCINEX) 600 MG 12 hr tablet Take 600 mg by mouth 2 (two) times daily as needed for cough or to loosen phlegm.    Yes [provider]  hydrocortisone cream 1 % Apply 1 application topically as needed.    Yes [provider]  levofloxacin (LEVAQUIN) 500 MG tablet Take 500 mg by mouth daily. 07/14/17  Yes [provider]  memantine (NAMENDA) 10 MG tablet Take 10 mg by mouth 2 (two) times daily.   Yes [provider]  metoprolol (LOPRESSOR) 50 MG tablet Take 1 tablet (50 mg total) by mouth 2 (two) times daily. 04/02/15  Yes Geradine Girt, DO  Multiple Vitamins-Minerals (ALIVE WOMENS 50+ PO) Take by mouth  daily.   Yes [provider]  nitroGLYCERIN (NITROSTAT) 0.4 MG SL tablet Place 1 tablet (0.4 mg total) under the tongue every 5 (five) minutes as needed for chest pain. 06/14/14  Yes Dellinger, Haynes Dage L, PA-C  ondansetron (ZOFRAN) 4 MG tablet Take 1 tablet (4 mg total) by mouth every 8 (eight) hours as needed for nausea or vomiting. 04/02/15  Yes Eulogio Bear U, DO  pantoprazole (PROTONIX) 40 MG tablet Take 1 tablet (40 mg total) by mouth 2 (two) times daily before a meal. 06/26/14  Yes Charlynne Cousins, MD  QUEtiapine (SEROQUEL) 25 MG tablet Take 25 mg by mouth at bedtime. 07/13/16  Yes [provider]  vitamin E 400 UNIT capsule Take 400 Units by mouth daily.   Yes [provider]    Physical Exam: Vitals:   07/15/17 1930 07/15/17 2015 07/15/17 2100 07/15/17 2145  BP: (!) 144/65 (!) 146/72  120/66  Pulse: 82 85 87 73  Resp: (!) 24 (!) 34 (!) 27 18  Temp:    98.9 F (37.2 C)  TempSrc:    Oral  SpO2: 96% 92% 93% 99%  Weight:    74 kg (163 lb 2.3 oz)  Height:    5\' 6"  (1.676 m)      Constitutional: Moderately built and nourished. Vitals:   07/15/17 1930 07/15/17 2015 07/15/17 2100 07/15/17 2145  BP: (!) 144/65 (!) 146/72  120/66  Pulse: 82 85 87 73  Resp: (!) 24 (!) 34  (!) 27 18  Temp:    98.9 F (37.2 C)  TempSrc:    Oral  SpO2: 96% 92% 93% 99%  Weight:    74 kg (163 lb 2.3 oz)  Height:    5\' 6"  (1.676 m)   Eyes: Anicteric no pallor. ENMT: No discharge from the ears eyes nose or mouth. Neck: No JVD appreciated no mass felt. Respiratory: Mild expiratory wheezes no crepitations. Cardiovascular: S1-S2 heard no murmurs appreciated. Abdomen: Soft nontender bowel sounds present. Musculoskeletal: No edema.  No joint effusion. Skin: No rash.  Skin appears warm. Neurologic: Alert awake oriented to time place and person.  Moves all extremities. Psychiatric: Appears normal.  Normal affect.   Labs on Admission: I have personally reviewed following labs and imaging studies  CBC: Recent Labs  Lab 07/15/17 1826  WBC 22.3*  NEUTROABS 19.7*  HGB 9.5*  HCT 30.1*  MCV 88.8  PLT 643   Basic Metabolic Panel: Recent Labs  Lab 07/15/17 1826  NA 136  K 4.1  CL 103  CO2 19*  GLUCOSE 97  BUN 13  CREATININE 1.08*  CALCIUM 8.7*   GFR: Estimated Creatinine Clearance: 35.6 mL/min (A) (by C-G formula based on SCr of 1.08 mg/dL (H)). Liver Function Tests: Recent Labs  Lab 07/15/17 1826  AST 25  ALT 18  ALKPHOS 74  BILITOT 0.9  PROT 7.3  ALBUMIN 2.9*   No results for input(s): LIPASE, AMYLASE in the last 168 hours. No results for input(s): AMMONIA in the last 168 hours. Coagulation Profile: No results for input(s): INR, PROTIME in the last 168 hours. Cardiac Enzymes: Recent Labs  Lab 07/15/17 1826  TROPONINI 0.05*   BNP (last 3 results) No results for input(s): PROBNP in the last 8760 hours. HbA1C: No results for input(s): HGBA1C in the last 72 hours. CBG: No results for input(s): GLUCAP in the last 168 hours. Lipid Profile: No results for input(s): CHOL, HDL, LDLCALC, TRIG, CHOLHDL, LDLDIRECT in the last 72 hours. Thyroid Function  Tests: No results for input(s): TSH, T4TOTAL, FREET4, T3FREE, THYROIDAB in the last 72 hours. Anemia  Panel: No results for input(s): VITAMINB12, FOLATE, FERRITIN, TIBC, IRON, RETICCTPCT in the last 72 hours. Urine analysis:    Component Value Date/Time   COLORURINE YELLOW 04/22/2015 [place] 04/22/2015 1705   LABSPEC 1.024 04/22/2015 1705   PHURINE 6.0 04/22/2015 1705   GLUCOSEU NEGATIVE 04/22/2015 1705   HGBUR NEGATIVE 04/22/2015 1705   BILIRUBINUR NEGATIVE 04/22/2015 [place] 04/22/2015 [place] 04/22/2015 1705   UROBILINOGEN 1.0 03/27/2015 2058   NITRITE NEGATIVE 04/22/2015 1705   LEUKOCYTESUR NEGATIVE 04/22/2015 1705   Sepsis Labs: @LABRCNTIP (procalcitonin:4,lacticidven:4) )No results found for this or any previous visit (from the past 240 hour(s)).   Radiological Exams on Admission: Dg Chest 2 View  Result Date: 07/15/2017 CLINICAL DATA:  Cough and shortness of breath since yesterday. EXAM: CHEST  2 VIEW COMPARISON:  07/14/2017 FINDINGS: Previous median sternotomy. Dual lead pacemaker appears the same. There is chronic pulmonary scarring. Interstitial markings do appear more prominent however, suggesting developing interstitial edema. No alveolar edema, collapse or effusion. Bony structures are unremarkable. IMPRESSION: Chronic interstitial lung markings, but with increased prominence suggesting fluid overload/interstitial edema. Electronically Signed   By: Nelson Chimes M.D.   On: 07/15/2017 19:10   Dg Chest 2 View  Result Date: 07/14/2017 CLINICAL DATA:  Cough for 3 weeks, fever today, decline energy, question pneumonia, history asthma, atrial fibrillation, stage III chronic kidney disease, dementia, essential hypertension, hypertrophic obstructive cardiomyopathy, coronary artery disease, pulmonary hypertension EXAM: CHEST  2 VIEW COMPARISON:  03/27/2015 FINDINGS: LEFT subclavian sequential pacemaker is project at RIGHT atrium and RIGHT ventricle, unchanged. Mild enlargement of cardiac silhouette. Mediastinal contours and pulmonary  vascularity normal. Scattered minimal chronic accentuation of interstitial markings particularly at the bases. No definite acute infiltrate, pleural effusion or pneumothorax. Bones demineralized. LEFT upper quadrant surgical clips again seen at IMPRESSION: Enlargement of cardiac silhouette. Minimal chronic basilar changes without acute infiltrate. Electronically Signed   By: Lavonia Dana M.D.   On: 07/14/2017 13:04    EKG: Independently reviewed.  A. fib rate controlled with LBBB.  Assessment/Plan Principal Problem:   Acute respiratory failure with hypoxia (HCC) Active Problems:   Chronic atrial fibrillation (HCC)   History of mitral valve replacement with bioprosthetic valve   Pulmonary HTN (HCC)   IHSS (idiopathic hypertrophic subaortic stenosis) (HCC)   Chronic kidney disease (CKD), stage III (moderate) (HCC)   Non-obstructive coronary artery disease   Essential hypertension   Community acquired pneumonia    1. Acute respiratory failure with hypoxia likely a combination of pneumonia and CHF -patient has been placed on ceftriaxone and Zithromax for community-acquired pneumonia.  Check repeat further PCR.  Follow sputum cultures urine for Legionella strep antigen.  I have ordered repeat 2D echo.  For now for CHF symptoms I have not placed patient on Lasix.  We will cycle cardiac markers. 2. Chronic atrial fibrillation presently rate controlled continue Cardizem.  Not on anticoagulation secondary to GI bleed. 3. History of pulmonary hypertension follow 2D echo. 4. History of mitral valve replacement with bioprosthetic valve check 2D echo since patient has symptoms concerning for CHF. 5. History of hypertrophic obstructive cardiomyopathy status post myomectomy.  Follow 2D echo. 6. Chronic anemia likely from chronic kidney disease follow CBC.  Patient also has history of GI bleed.  7. Chronic kidney disease stage III -follow metabolic panel. 8. History of depression we will continue home  medications. 9. History of pacemaker placement for sinus node dysfunction.   DVT prophylaxis: Lovenox. Code Status: Full code. Family Communication: Patient's daughter. Disposition Plan: Home. Consults called: None. Admission status: Inpatient.   Rise Patience MD [facility] Pager [phone].  If 7PM-7AM, please contact night-coverage [email or web address] Password [facility]  07/15/2017, 10:04 PM

## 2017-07-15 NOTE — ED Notes (Signed)
Phlebotomy at bedside for second set of cultures

## 2017-07-15 NOTE — ED Provider Notes (Signed)
[facility] EMERGENCY DEPARTMENT Provider Note   CSN: [id number] Arrival date & time: 07/15/17  1607     History   Chief Complaint Chief Complaint  Patient presents with  . Weakness  . Cough    HPI Jasmine [name] is a 84 y.o. female.  HPI  84 year old female with a history of atrial fibrillation, pulmonary hypertension presents with cough and fever.  Started about 3 or 4 days ago.  The cough has mostly yellow sputum.  She has been feeling short of breath.  She has had some chest pain as well.  She denies headache, sore throat, congestion.  Seen at the doctor's office yesterday where she reportedly had a negative chest x-ray and influenza swab.  She was placed on antibiotics.  She is not sure which one but took her first dose yesterday and then another today.  Past Medical History:  Diagnosis Date  . Aortic insufficiency    a. 06/2014 Echo: Mild AI.  Marland Kitchen Asthma   . Atrial fibrillation and flutter    a. atrial fibrillation s/p DCCV 06-2012;  b. now in chronic atrial fibrillation off anticoagulation due to GI bleed.  . Bradycardia    a. 10/2011 s/p SJM Accent DR DC PPM, ser #: [id number].  Marland Kitchen Chronic kidney disease (CKD), stage III (moderate) (HCC)   . Dementia   . Diverticulosis of colon with hemorrhage 08/12/2012  . Essential hypertension   . GERD (gastroesophageal reflux disease)   . Glaucoma   . History of GI Bleed    a. prevents use of [place].  Marland Kitchen History of Severe Mitral Regurgitation    a. s/p MVR with pericardial tissue valve;  b. 06/2014 Mild Mitral Stenosis by echo.  Marland Kitchen HOCM (hypertrophic obstructive cardiomyopathy) ([place])    a. s/p septal myomectomy;  b. 06/2014 Echo: EF 50-55%, mild antsept HK, mild MR, mild MS/MR (bioprosth valve), mildly dil LA, mod TR, PASP 22mmHg.  Marland Kitchen Hyperlipidemia   . Insomnia   . Non-obstructive coronary artery disease    a. 06/2014 NSTEMI/Cath: LM nl, LAD nl, LCX nl, RCA 10-3m, EF 45%.  . Pulmonary HTN ([place])    a. mild to moderate (PASP  47mmHg by echo 06/2014).  . Reflux esophagitis     Patient Active Problem List   Diagnosis Date Noted  . Acute respiratory failure with hypoxia ([place]) 07/15/2017  . Small bowel obstruction due to adhesions ([place]) 03/29/2015  . SBO (small bowel obstruction) ([place])   . Abnormal finding on EKG 03/27/2015  . Essential hypertension   . ST-segment elevation myocardial infarction (STEMI) of inferior wall ([place])   . Epigastric pain   . GIB (gastrointestinal bleeding) 10/18/2014  . Non-obstructive coronary artery disease   . Atypical chest pain 06/23/2014  . Chronic kidney disease (CKD), stage III (moderate) ([place]) 06/23/2014  . DVT prophylaxis 06/23/2014  . Systolic and diastolic CHF, chronic ([place])   . Gastroesophageal reflux disease without esophagitis   . Mitral valve regurgitation   . Hyperglycemia   . Coronary artery disease, non-occlusive by cardiac cath 06/11/2014  . Demand ischemia of myocardium - converted from diagnosis of non-STEMI   . Chronic combined systolic and diastolic CHF (congestive heart failure) ([place])   . IHSS (idiopathic hypertrophic subaortic stenosis) ([place])   . Sinus bradycardia 02/20/2014  . Reflux esophagitis   . Pulmonary HTN ([place])   . Severe mitral regurgitation   . PVCs (premature ventricular contractions)   . DCM (dilated cardiomyopathy) ([place])   . GI bleed  08/16/2012  . Benign neoplasm of colon 08/13/2012  . History of Diverticulosis of colon with hemorrhage 08/12/2012  . Warfarin anticoagulation 08/12/2012  . History of mitral valve replacement with bioprosthetic valve 08/12/2012  . Chronic atrial fibrillation ([place]) 07/04/2012  . Pacemaker-St.Jude 10/11/2011  . RBBB 10/07/2011  . Keloid 10/07/2011    Past Surgical History:  Procedure Laterality Date  . APPENDECTOMY    . CARDIAC CATHETERIZATION    . CARDIAC CATHETERIZATION N/A 03/27/2015   Procedure: Left Heart Cath and Coronary Angiography;  Surgeon: Leonie Man, MD;  Location: [facility];   Service: Cardiovascular;  Laterality: N/A;  . CARDIAC VALVE REPLACEMENT  2007   MVR  . CARDIOVERSION  07/04/2012   Procedure: CARDIOVERSION;  Surgeon: Sueanne Margarita, MD;  Location: [facility];  Service: Cardiovascular;  Laterality: N/A;  h/p in file drawer   . CATARACT EXTRACTION W/ INTRAOCULAR LENS  IMPLANT, BILATERAL    . CHOLECYSTECTOMY    . COLON RESECTION    . COLONOSCOPY    . COLONOSCOPY N/A 08/13/2012   Procedure: COLONOSCOPY;  Surgeon: Gatha Mayer, MD;  Location: [facility];  Service: Endoscopy;  Laterality: N/A;  . HERNIA REPAIR     "in my stomach"  . LEFT HEART CATHETERIZATION WITH CORONARY ANGIOGRAM N/A 06/11/2014   Procedure: LEFT HEART CATHETERIZATION WITH CORONARY ANGIOGRAM;  Surgeon: Troy Sine, MD;  Location: [facility] C[facility] LAB;  Service: Cardiovascular;  Laterality: N/A;  . MYOMECTOMY  2007   septal  . PERMANENT PACEMAKER INSERTION N/A 10/07/2011   Procedure: PERMANENT PACEMAKER INSERTION;  Surgeon: Deboraha Sprang, MD;  Location: [facility] CATH LAB;  Service: Cardiovascular;  Laterality: N/A;  . TONSILLECTOMY    . TUBAL LIGATION      OB History    No data available       Home Medications    Prior to Admission medications   Medication Sig Start Date End Date Taking? Authorizing Provider  acetaminophen (TYLENOL) 500 MG tablet Take 500 mg by mouth every 6 (six) hours as needed. For pain   Yes [provider]  albuterol (PROVENTIL HFA;VENTOLIN HFA) 108 (90 BASE) MCG/ACT inhaler Inhale 2 puffs into the lungs every 4 (four) hours as needed for wheezing or shortness of breath. For shortness of breath   Yes [provider]  budesonide (PULMICORT) 180 MCG/ACT inhaler Inhale 2 puffs into the lungs daily as needed (SOB/Wheezing).    Yes [provider]  cholecalciferol (VITAMIN D) 400 UNITS TABS tablet Take 400 Units by mouth daily.   Yes [provider]  COMBIGAN 0.2-0.5 % ophthalmic solution Place 1 drop into both eyes daily. 06/21/17  Yes  [provider]  diltiazem (CARDIZEM CD) 120 MG 24 hr capsule Take 1 capsule (120 mg total) by mouth daily. 04/02/15  Yes Vann, Jessica U, DO  dorzolamide-timolol (COSOPT) 22.3-6.8 MG/ML ophthalmic solution Place 1 drop into both eyes 2 (two) times daily.  07/19/14  Yes [provider]  ferrous sulfate 325 (65 FE) MG tablet Take 1 tablet (325 mg total) by mouth daily with breakfast. 10/20/14  Yes Delfina Redwood, MD  fluticasone (FLONASE) 50 MCG/ACT nasal spray Place 1 spray into both nostrils daily as needed for allergies or rhinitis.    Yes [provider]  FOLIC ACID-VIT G1-WEX H37 PO Take 1 tablet by mouth daily.   Yes [provider]  guaiFENesin (MUCINEX) 600 MG 12 hr tablet Take 600 mg by mouth 2 (two) times daily as needed  for cough or to loosen phlegm.    Yes [provider]  hydrocortisone cream 1 % Apply 1 application topically as needed.    Yes [provider]  levofloxacin (LEVAQUIN) 500 MG tablet Take 500 mg by mouth daily. 07/14/17  Yes [provider]  memantine (NAMENDA) 10 MG tablet Take 10 mg by mouth 2 (two) times daily.   Yes [provider]  metoprolol (LOPRESSOR) 50 MG tablet Take 1 tablet (50 mg total) by mouth 2 (two) times daily. 04/02/15  Yes Geradine Girt, DO  Multiple Vitamins-Minerals (ALIVE WOMENS 50+ PO) Take by mouth daily.   Yes [provider]  nitroGLYCERIN (NITROSTAT) 0.4 MG SL tablet Place 1 tablet (0.4 mg total) under the tongue every 5 (five) minutes as needed for chest pain. 06/14/14  Yes Dellinger, Haynes Dage L, PA-C  ondansetron (ZOFRAN) 4 MG tablet Take 1 tablet (4 mg total) by mouth every 8 (eight) hours as needed for nausea or vomiting. 04/02/15  Yes Eulogio Bear U, DO  pantoprazole (PROTONIX) 40 MG tablet Take 1 tablet (40 mg total) by mouth 2 (two) times daily before a meal. 06/26/14  Yes Charlynne Cousins, MD  QUEtiapine (SEROQUEL) 25 MG tablet Take 25 mg by mouth at  bedtime. 07/13/16  Yes [provider]  vitamin E 400 UNIT capsule Take 400 Units by mouth daily.   Yes [provider]    Family History Family History  Problem Relation Age of Onset  . CVA Mother   . Hypertension Mother   . CVA Brother   . Hypertension Brother     Social History Social History   Tobacco Use  . Smoking status: Former Smoker    Types: Cigarettes    Last attempt to quit: 06/07/1974    Years since quitting: 43.1  . Smokeless tobacco: Former Systems developer    Types: Snuff    Quit date: 04/23/1975  . Tobacco comment: 10/05/11 "  Substance Use Topics  . Alcohol use: No    Alcohol/week: 0.0 oz    Comment: 10/05/11 "used to drink; quit a long time ago"  . Drug use: No     Allergies   Cymbalta [duloxetine hcl] and Dexilant [dexlansoprazole]   Review of Systems Review of Systems  Constitutional: Positive for fever.  HENT: Positive for congestion. Negative for sore throat.   Respiratory: Positive for cough and shortness of breath.   Cardiovascular: Positive for chest pain.  Gastrointestinal: Negative for vomiting.  Musculoskeletal: Negative for myalgias.  Neurological: Negative for headaches.  All other systems reviewed and are negative.    Physical Exam Updated Vital Signs BP (!) 144/65   Pulse 82   Temp (!) 100.5 F (38.1 C) (Oral)   Resp (!) 24   Ht 5\' 9"  (1.753 m)   Wt 75.8 kg (167 lb)   SpO2 96%   BMI 24.66 kg/m   Physical Exam  Constitutional: She is oriented to person, place, and time. She appears well-developed and well-nourished.  HENT:  Head: Normocephalic and atraumatic.  Right Ear: External ear normal.  Left Ear: External ear normal.  Nose: Nose normal.  Eyes: Right eye exhibits no discharge. Left eye exhibits no discharge.  Cardiovascular: Normal rate, regular rhythm and normal heart sounds.  Pulmonary/Chest: No accessory muscle usage. Tachypnea noted. No respiratory distress. She has rales in the right lower field.    Frequent coughing  Abdominal: Soft. There is no tenderness.  Neurological: She is alert and oriented to person, place, and  time.  Skin: Skin is warm and dry. She is not diaphoretic.  Nursing note and vitals reviewed.    ED Treatments / Results  Labs (all labs ordered are listed, but only abnormal results are displayed) Labs Reviewed  COMPREHENSIVE METABOLIC PANEL - Abnormal; Notable for the following components:      Result Value   CO2 19 (*)    Creatinine, Ser 1.08 (*)    Calcium 8.7 (*)    Albumin 2.9 (*)    GFR calc non Af Amer 44 (*)    GFR calc Af Amer 51 (*)    All other components within normal limits  TROPONIN I - Abnormal; Notable for the following components:   Troponin I 0.05 (*)    All other components within normal limits  CBC WITH DIFFERENTIAL/PLATELET - Abnormal; Notable for the following components:   WBC 22.3 (*)    RBC 3.39 (*)    Hemoglobin 9.5 (*)    HCT 30.1 (*)    Neutro Abs 19.7 (*)    Monocytes Absolute 1.1 (*)    All other components within normal limits  BRAIN NATRIURETIC PEPTIDE - Abnormal; Notable for the following components:   B Natriuretic Peptide 366.4 (*)    All other components within normal limits  CULTURE, BLOOD (ROUTINE X 2)  CULTURE, BLOOD (ROUTINE X 2)  INFLUENZA PANEL BY PCR (TYPE A & B)  I-STAT CG4 LACTIC ACID, ED  I-STAT CG4 LACTIC ACID, ED    EKG  EKG Interpretation  Date/Time:  Friday July 15 2017 18:17:31 EST Ventricular Rate:  98 PR Interval:    QRS Duration: 150 QT Interval:  399 QTC Calculation: 510 R Axis:   16 Text Interpretation:  Atrial fibrillation Ventricular premature complex Left bundle branch block no significant change since 2016 Confirmed by Sherwood Gambler [phone]) on 07/15/2017 6:20:21 PM       Radiology Dg Chest 2 View  Result Date: 07/15/2017 CLINICAL DATA:  Cough and shortness of breath since yesterday. EXAM: CHEST  2 VIEW COMPARISON:  07/14/2017 FINDINGS: Previous median sternotomy. Dual  lead pacemaker appears the same. There is chronic pulmonary scarring. Interstitial markings do appear more prominent however, suggesting developing interstitial edema. No alveolar edema, collapse or effusion. Bony structures are unremarkable. IMPRESSION: Chronic interstitial lung markings, but with increased prominence suggesting fluid overload/interstitial edema. Electronically Signed   By: Nelson Chimes M.D.   On: 07/15/2017 19:10   Dg Chest 2 View  Result Date: 07/14/2017 CLINICAL DATA:  Cough for 3 weeks, fever today, decline energy, question pneumonia, history asthma, atrial fibrillation, stage III chronic kidney disease, dementia, essential hypertension, hypertrophic obstructive cardiomyopathy, coronary artery disease, pulmonary hypertension EXAM: CHEST  2 VIEW COMPARISON:  03/27/2015 FINDINGS: LEFT subclavian sequential pacemaker is project at RIGHT atrium and RIGHT ventricle, unchanged. Mild enlargement of cardiac silhouette. Mediastinal contours and pulmonary vascularity normal. Scattered minimal chronic accentuation of interstitial markings particularly at the bases. No definite acute infiltrate, pleural effusion or pneumothorax. Bones demineralized. LEFT upper quadrant surgical clips again seen at IMPRESSION: Enlargement of cardiac silhouette. Minimal chronic basilar changes without acute infiltrate. Electronically Signed   By: Lavonia Dana M.D.   On: 07/14/2017 13:04    Procedures Procedures (including critical care time)  Medications Ordered in ED Medications  cefTRIAXone (ROCEPHIN) 1 g in dextrose 5 % 50 mL IVPB (1 g Intravenous New Bag/Given 07/15/17 2016)  azithromycin (ZITHROMAX) 500 mg in dextrose 5 % 250 mL IVPB (500 mg Intravenous New Bag/Given 07/15/17 2017)  sodium chloride 0.9 % bolus 500 mL (0 mLs Intravenous Stopped 07/15/17 1915)  albuterol (PROVENTIL) (2.5 MG/3ML) 0.083% nebulizer solution 2.5 mg (2.5 mg Nebulization Given 07/15/17 1918)  acetaminophen (TYLENOL) tablet 650 mg (650 mg  Oral Given 07/15/17 1918)  aspirin chewable tablet 324 mg (324 mg Oral Given 07/15/17 2024)     Initial Impression / Assessment and Plan / ED Course  I have reviewed the triage vital signs and the nursing notes.  Pertinent labs & imaging results that were available during my care of the patient were reviewed by me and considered in my medical decision making (see chart for details).     Patient's shortness of breath is better after albuterol treatment.  She is not septic but does have a low-grade fever and symptoms consistent with pneumonia.  She does not have a big lobar pneumonia on chest x-ray and has some edema.  However I am still concerned about pneumonia, especially with the fever and the elevated WBC.  She was treated with Rocephin and azithromycin.  She may need echocardiogram to help delineate heart failure.  BNP is nonspecific at this time.  Admit to the hospitalist service, Dr. Hal Hope to admit.  CHA2DS2/VAS Stroke Risk Points      6 >= 2 Points: High Risk  1 - 1.99 Points: Medium Risk  0 Points: Low Risk    This is the only CHA2DS2/VAS Stroke Risk Points available for the past  year.:  Change: N/A     Details    This score determines the patient's risk of having a stroke if the  patient has atrial fibrillation.       Points Metrics  1 Has Congestive Heart Failure:  Yes   1 Has Vascular Disease:  Yes   1 Has Hypertension:  Yes   2 Age:  38   0 Has Diabetes:  No   0 Had Stroke:  No  Had TIA:  No  Had thromboembolism:  No   1 Female:  Yes             Final Clinical Impressions(s) / ED Diagnoses   Final diagnoses:  Community acquired pneumonia, unspecified laterality    ED Discharge Orders    None       Sherwood Gambler, MD 07/15/17 2045

## 2017-07-15 NOTE — ED Triage Notes (Signed)
Pt arrives to ED from home with complaints of cough and SOB since yesterday. EMS reports pt went to her PCP yesterday and was given abx for respiratory infection but is still weak. Pt has hx of pacemaker. Pt is nauseous, no vomiting. Pt placed in position of comfort with bed locked and lowered, call bell in reach.

## 2017-07-16 ENCOUNTER — Other Ambulatory Visit: Payer: Self-pay

## 2017-07-16 ENCOUNTER — Other Ambulatory Visit (HOSPITAL_COMMUNITY): Payer: Self-pay

## 2017-07-16 DIAGNOSIS — I2510 Atherosclerotic heart disease of native coronary artery without angina pectoris: Secondary | ICD-10-CM

## 2017-07-16 DIAGNOSIS — J189 Pneumonia, unspecified organism: Secondary | ICD-10-CM

## 2017-07-16 DIAGNOSIS — I2583 Coronary atherosclerosis due to lipid rich plaque: Secondary | ICD-10-CM

## 2017-07-16 LAB — CBC
HEMATOCRIT: 29.5 % — AB (ref 36.0–46.0)
Hemoglobin: 9.3 g/dL — ABNORMAL LOW (ref 12.0–15.0)
MCH: 27.8 pg (ref 26.0–34.0)
MCHC: 31.5 g/dL (ref 30.0–36.0)
MCV: 88.3 fL (ref 78.0–100.0)
Platelets: 329 10*3/uL (ref 150–400)
RBC: 3.34 MIL/uL — AB (ref 3.87–5.11)
RDW: 14.3 % (ref 11.5–15.5)
WBC: 14.8 10*3/uL — ABNORMAL HIGH (ref 4.0–10.5)

## 2017-07-16 LAB — STREP PNEUMONIAE URINARY ANTIGEN: STREP PNEUMO URINARY ANTIGEN: NEGATIVE

## 2017-07-16 LAB — TROPONIN I
Troponin I: 0.03 ng/mL (ref <0.03)
Troponin I: 0.03 ng/mL (ref <0.03)

## 2017-07-16 LAB — BASIC METABOLIC PANEL
Anion gap: 12 (ref 5–15)
BUN: 13 mg/dL (ref 6–20)
CALCIUM: 8.5 mg/dL — AB (ref 8.9–10.3)
CO2: 21 mmol/L — ABNORMAL LOW (ref 22–32)
Chloride: 102 mmol/L (ref 101–111)
Creatinine, Ser: 1.13 mg/dL — ABNORMAL HIGH (ref 0.44–1.00)
GFR calc Af Amer: 48 mL/min — ABNORMAL LOW (ref >60)
GFR, EST NON AFRICAN AMERICAN: 41 mL/min — AB (ref >60)
GLUCOSE: 91 mg/dL (ref 65–99)
Potassium: 3.4 mmol/L — ABNORMAL LOW (ref 3.5–5.1)
Sodium: 135 mmol/L (ref 135–145)

## 2017-07-16 LAB — PROTIME-INR
INR: 1.38
PROTHROMBIN TIME: 16.9 s — AB (ref 11.4–15.2)

## 2017-07-16 LAB — EXPECTORATED SPUTUM ASSESSMENT W GRAM STAIN, RFLX TO RESP C

## 2017-07-16 LAB — EXPECTORATED SPUTUM ASSESSMENT W REFEX TO RESP CULTURE

## 2017-07-16 MED ORDER — ENOXAPARIN SODIUM 40 MG/0.4ML ~~LOC~~ SOLN
40.0000 mg | SUBCUTANEOUS | Status: DC
  Administered 2017-07-16 – 2017-07-24 (×9): 40 mg via SUBCUTANEOUS
  Filled 2017-07-16 (×9): qty 0.4

## 2017-07-16 MED ORDER — IPRATROPIUM-ALBUTEROL 0.5-2.5 (3) MG/3ML IN SOLN
3.0000 mL | RESPIRATORY_TRACT | Status: DC | PRN
  Administered 2017-07-16: 3 mL via RESPIRATORY_TRACT
  Filled 2017-07-16 (×2): qty 3

## 2017-07-16 MED ORDER — BRIMONIDINE TARTRATE 0.2 % OP SOLN
1.0000 [drp] | Freq: Every day | OPHTHALMIC | Status: DC
  Administered 2017-07-17 – 2017-07-23 (×7): 1 [drp] via OPHTHALMIC
  Filled 2017-07-16 (×2): qty 5

## 2017-07-16 MED ORDER — TIMOLOL MALEATE 0.5 % OP SOLN
1.0000 [drp] | Freq: Every day | OPHTHALMIC | Status: DC
  Administered 2017-07-17 – 2017-07-23 (×7): 1 [drp] via OPHTHALMIC
  Filled 2017-07-16 (×2): qty 5

## 2017-07-16 NOTE — Progress Notes (Signed)
Patient has had no urinary output since being admitted last night. Patient stated that they have not had a lot to drink on yesterday.Will continue to monitor. Performed a bladder scan that showed 85 ml. Will continue to monitor.   Drue Flirt, RN

## 2017-07-16 NOTE — Progress Notes (Signed)
Patient complaining of shortness of breath.  Oxygen saturation 94% on room air.  Dr. Ree Kida paged for prn nebulizer, given to patient.  Patient states breathing better post nebulizer. Will continue to monitor.

## 2017-07-16 NOTE — Progress Notes (Signed)
PROGRESS NOTE    Jasmine [name]  XBJ:[id number] DOB: Jul 21, 1924 DOA: 07/15/2017 PCP: [name], MD   Chief Complaint  Patient presents with  . Weakness  . Cough    Brief Narrative:  HPI on 07/15/2017 by Dr. Gean Birchwood Jasmine [name] is a 84 y.o. female with history of sick sinus node status post pacemaker placement, chronic kidney disease stage III, chronic anemia, dementia was brought to the ER after patient had persistent shortness of breath.  Patient has been having persistent shortness of breath over the last 2 weeks with some cough and exertional symptoms.  Off and on and has had some chest pain.  Patient had gone to her PCP yesterday and was prescribed antibiotic for possible pneumonia.  In the office at the PCPs patient influenza was negative per the patient.  Despite which patient symptoms did not improve and came to the ER.  Interim history Admitted for Acute respiratory failure secondary to pneumonia and CHF Assessment & Plan   Acute respiratory failure -Likely multifactorial including pneumonia and CHF -Continue to treat underlying conditions -No hypoxia noted.  Patient has had shortness of breath which is been persistent for the last 2 weeks  Sepsis secondary to community-acquired pneumonia -Upon admission, patient was febrile wit tachypnea and leukocytosis  -continue azithromycin and ceftriaxone -Influenza PCR negative -Strep pneumonia and Legionella urine antigens pending  Chronic diastolic CHF -Chest x-ray showed possible fluid overload/interstitial edema -Patient does not appear to be volume overloaded at this time -BNP 366 -Lasix currently held due to sepsis -Monitor intake and output, daily weights -Echocardiogram 06/24/2014 showed an EF of 50-55% mild hypokinesis of the anterior septal myocardium.  Chronic atrial fibrillation -Currently appears to be paced on cardiac monitoring -Continue Cardizem -Does not appear to be on Coumadin due to GI bleed,  will confirm this with family  Pulmonary hypertension -Echocardiogram pending  History of mitral valve replacement with bioprosthetic valve  History of hypertrophic obstructive cardiomyopathy -Status post myomectomy -Echocardiogram pending -Last echocardiogram in 06/24/2014 showed hypokinesis of the mid and distal anterior septal myocardium consistent with prior septal myomectomy for HOCM.  EF of 50-55%  Chronic anemia -Hemoglobin currently 9.3, no labs since 2016 -Continue iron supplementation -Continue to monitor CBC  Chronic kidney disease, stage III -Creatinine currently appears stable, 1.13, continue to monitor BMP  Depression -Continue Seroquel  Dementia -Stable, continue Namenda  DVT Prophylaxis Lovenox  Code Status: Full  Family Communication: None at bedside  Disposition Plan: Admitted  Consultants None  Procedures  None  Antibiotics   Anti-infectives (From admission, onward)   Start     Dose/Rate Route Frequency Ordered Stop   07/16/17 2000  cefTRIAXone (ROCEPHIN) 1 g in dextrose 5 % 50 mL IVPB     1 g 100 mL/hr over 30 Minutes Intravenous Every 24 hours 07/15/17 2204 07/23/17 1959   07/16/17 2000  azithromycin (ZITHROMAX) 500 mg in dextrose 5 % 250 mL IVPB     500 mg 250 mL/hr over 60 Minutes Intravenous Every 24 hours 07/15/17 2204 07/23/17 1959   07/15/17 1945  cefTRIAXone (ROCEPHIN) 1 g in dextrose 5 % 50 mL IVPB     1 g 100 mL/hr over 30 Minutes Intravenous  Once 07/15/17 1934 07/15/17 2100   07/15/17 1945  azithromycin (ZITHROMAX) 500 mg in dextrose 5 % 250 mL IVPB     500 mg 250 mL/hr over 60 Minutes Intravenous  Once 07/15/17 1934 07/15/17 2121      Subjective:   Jasmine Morgans  [name] seen and examined today.  With history of dementia.  Patient currently denies any shortness of breath, has occasional cough.  Denies current chest pain, abdominal pain, nausea or vomiting, diarrhea or constipation.  Patient not sure why she is in the  hospital.  Objective:   Vitals:   07/15/17 2145 07/16/17 0348 07/16/17 0751 07/16/17 0953  BP: 120/66 125/63  (!) 135/54  Pulse: 73 77  90  Resp: 18 18    Temp: 98.9 F (37.2 C) 98.2 F (36.8 C)    TempSrc: Oral Oral    SpO2: 99% 98% 96%   Weight: 74 kg (163 lb 2.3 oz) 74.1 kg (163 lb 5.8 oz)    Height: 5\' 6"  (1.676 m)       Intake/Output Summary (Last 24 hours) at 07/16/2017 1044 Last data filed at 07/16/2017 1004 Gross per 24 hour  Intake 230 ml  Output 200 ml  Net 30 ml   Filed Weights   07/15/17 1612 07/15/17 2145 07/16/17 0348  Weight: 75.8 kg (167 lb) 74 kg (163 lb 2.3 oz) 74.1 kg (163 lb 5.8 oz)    Exam  General: Well developed, elderly, no apparent distress  HEENT: NCAT, mucous membranes moist.   Cardiovascular: S1 S2 auscultated, RRR  Respiratory: Diminished breath sounds, no wheezing  Abdomen: Soft, nontender, nondistended, + bowel sounds  Extremities: warm dry without cyanosis clubbing or edema  Neuro: AAOx2, nonfocal, history of dementia  Psych: appropriate, pleasant   Data Reviewed: I have personally reviewed following labs and imaging studies  CBC: Recent Labs  Lab 07/15/17 1826 07/16/17 0429  WBC 22.3* 14.8*  NEUTROABS 19.7*  --   HGB 9.5* 9.3*  HCT 30.1* 29.5*  MCV 88.8 88.3  PLT 359 034   Basic Metabolic Panel: Recent Labs  Lab 07/15/17 1826 07/15/17 2209 07/16/17 0429  NA 136  --  135  K 4.1  --  3.4*  CL 103  --  102  CO2 19*  --  21*  GLUCOSE 97  --  91  BUN 13  --  13  CREATININE 1.08*  --  1.13*  CALCIUM 8.7*  --  8.5*  MG  --  1.9  --    GFR: Estimated Creatinine Clearance: 34.1 mL/min (A) (by C-G formula based on SCr of 1.13 mg/dL (H)). Liver Function Tests: Recent Labs  Lab 07/15/17 1826  AST 25  ALT 18  ALKPHOS 74  BILITOT 0.9  PROT 7.3  ALBUMIN 2.9*   No results for input(s): LIPASE, AMYLASE in the last 168 hours. No results for input(s): AMMONIA in the last 168 hours. Coagulation Profile: Recent  Labs  Lab 07/16/17 0429  INR 1.38   Cardiac Enzymes: Recent Labs  Lab 07/15/17 1826 07/15/17 2209 07/16/17 0429  TROPONINI 0.05* 0.03* 0.03*   BNP (last 3 results) No results for input(s): PROBNP in the last 8760 hours. HbA1C: No results for input(s): HGBA1C in the last 72 hours. CBG: No results for input(s): GLUCAP in the last 168 hours. Lipid Profile: No results for input(s): CHOL, HDL, LDLCALC, TRIG, CHOLHDL, LDLDIRECT in the last 72 hours. Thyroid Function Tests: Recent Labs    07/15/17 2209  TSH 2.314   Anemia Panel: No results for input(s): VITAMINB12, FOLATE, FERRITIN, TIBC, IRON, RETICCTPCT in the last 72 hours. Urine analysis:    Component Value Date/Time   COLORURINE YELLOW 04/22/2015 [place] 04/22/2015 1705   LABSPEC 1.024 04/22/2015 1705   PHURINE 6.0 04/22/2015 1705  GLUCOSEU NEGATIVE 04/22/2015 1705   HGBUR NEGATIVE 04/22/2015 1705   BILIRUBINUR NEGATIVE 04/22/2015 [place] 04/22/2015 1705   PROTEINUR NEGATIVE 04/22/2015 1705   UROBILINOGEN 1.0 03/27/2015 2058   NITRITE NEGATIVE 04/22/2015 1705   LEUKOCYTESUR NEGATIVE 04/22/2015 1705   Sepsis Labs: @LABRCNTIP (procalcitonin:4,lacticidven:4)  )No results found for this or any previous visit (from the past 240 hour(s)).    Radiology Studies: Dg Chest 2 View  Result Date: 07/15/2017 CLINICAL DATA:  Cough and shortness of breath since yesterday. EXAM: CHEST  2 VIEW COMPARISON:  07/14/2017 FINDINGS: Previous median sternotomy. Dual lead pacemaker appears the same. There is chronic pulmonary scarring. Interstitial markings do appear more prominent however, suggesting developing interstitial edema. No alveolar edema, collapse or effusion. Bony structures are unremarkable. IMPRESSION: Chronic interstitial lung markings, but with increased prominence suggesting fluid overload/interstitial edema. Electronically Signed   By: Nelson Chimes M.D.   On: 07/15/2017 19:10   Dg  Chest 2 View  Result Date: 07/14/2017 CLINICAL DATA:  Cough for 3 weeks, fever today, decline energy, question pneumonia, history asthma, atrial fibrillation, stage III chronic kidney disease, dementia, essential hypertension, hypertrophic obstructive cardiomyopathy, coronary artery disease, pulmonary hypertension EXAM: CHEST  2 VIEW COMPARISON:  03/27/2015 FINDINGS: LEFT subclavian sequential pacemaker is project at RIGHT atrium and RIGHT ventricle, unchanged. Mild enlargement of cardiac silhouette. Mediastinal contours and pulmonary vascularity normal. Scattered minimal chronic accentuation of interstitial markings particularly at the bases. No definite acute infiltrate, pleural effusion or pneumothorax. Bones demineralized. LEFT upper quadrant surgical clips again seen at IMPRESSION: Enlargement of cardiac silhouette. Minimal chronic basilar changes without acute infiltrate. Electronically Signed   By: Lavonia Dana M.D.   On: 07/14/2017 13:04     Scheduled Meds: . [START ON 07/17/2017] brimonidine  1 drop Both Eyes Daily   And  . [START ON 07/17/2017] timolol  1 drop Both Eyes Daily  . budesonide  0.25 mg Nebulization BID  . diltiazem  120 mg Oral Daily  . dorzolamide-timolol  1 drop Both Eyes BID  . enoxaparin (LOVENOX) injection  40 mg Subcutaneous Q24H  . ferrous sulfate  325 mg Oral Q breakfast  . memantine  10 mg Oral BID  . metoprolol tartrate  50 mg Oral BID  . pantoprazole  40 mg Oral BID AC  . QUEtiapine  25 mg Oral QHS  . vitamin E  400 Units Oral Daily   Continuous Infusions: . azithromycin    . cefTRIAXone (ROCEPHIN)  IV       LOS: 1 day   Time Spent in minutes   30 minutes  Maylani Embree D.O. on 07/16/2017 at 10:44 AM  Between 7am to 7pm - Pager - [phone]  After 7pm go to [email or web address] - password [email or web address]  And look for the night coverage person covering for me after hours  [facility] Office  [phone]

## 2017-07-16 NOTE — Progress Notes (Signed)
Page to Dr Ree Kida requesting PRN neb orders for patient.

## 2017-07-16 NOTE — Progress Notes (Signed)
ANTICOAGULATION CONSULT NOTE - Initial Consult  Pharmacy Consult for lovenox Indication: VTE prophylaxis  Allergies  Allergen Reactions  . Cymbalta [Duloxetine Hcl] Diarrhea and Other (See Comments)    weakness  . Dexilant [Dexlansoprazole] Diarrhea and Other (See Comments)    Bloating also    Patient Measurements: Height: 5\' 6"  (167.6 cm) Weight: 163 lb 5.8 oz (74.1 kg) IBW/kg (Calculated) : 59.3   Vital Signs: Temp: 98.2 F (36.8 C) (02/09 0348) Temp Source: Oral (02/09 0348) BP: 125/63 (02/09 0348) Pulse Rate: 77 (02/09 0348)  Labs: Recent Labs    07/15/17 1826 07/15/17 2209 07/16/17 0429  HGB 9.5*  --  9.3*  HCT 30.1*  --  29.5*  PLT 359  --  329  LABPROT  --   --  16.9*  INR  --   --  1.38  CREATININE 1.08*  --  1.13*  TROPONINI 0.05* 0.03* 0.03*    Estimated Creatinine Clearance: 34.1 mL/min (A) (by C-G formula based on SCr of 1.13 mg/dL (H)).   Medical History: Past Medical History:  Diagnosis Date  . Aortic insufficiency    a. 06/2014 Echo: Mild AI.  Marland Kitchen Asthma   . Atrial fibrillation and flutter    a. atrial fibrillation s/p DCCV 06-2012;  b. now in chronic atrial fibrillation off anticoagulation due to GI bleed.  . Bradycardia    a. 10/2011 s/p SJM [facility] DR DC PPM, ser #: [id number].  Marland Kitchen Chronic kidney disease (CKD), stage III (moderate) (HCC)   . Dementia   . Diverticulosis of colon with hemorrhage 08/12/2012  . Essential hypertension   . GERD (gastroesophageal reflux disease)   . Glaucoma   . History of GI Bleed    a. prevents use of [place].  Marland Kitchen History of Severe Mitral Regurgitation    a. s/p MVR with pericardial tissue valve;  b. 06/2014 Mild Mitral Stenosis by echo.  Marland Kitchen HOCM (hypertrophic obstructive cardiomyopathy) ([place])    a. s/p septal myomectomy;  b. 06/2014 Echo: EF 50-55%, mild antsept HK, mild MR, mild MS/MR (bioprosth valve), mildly dil LA, mod TR, PASP 80mmHg.  Marland Kitchen Hyperlipidemia   . Insomnia   . Non-obstructive coronary artery disease    a.  06/2014 NSTEMI/Cath: LM nl, LAD nl, LCX nl, RCA 10-11m, EF 45%.  . Pulmonary HTN ([place])    a. mild to moderate (PASP 39mmHg by echo 06/2014).  . Reflux esophagitis     Medications:  Scheduled:  . [START ON 07/17/2017] brimonidine  1 drop Both Eyes Daily   And  . [START ON 07/17/2017] timolol  1 drop Both Eyes Daily  . budesonide  0.25 mg Nebulization BID  . diltiazem  120 mg Oral Daily  . dorzolamide-timolol  1 drop Both Eyes BID  . enoxaparin (LOVENOX) injection  40 mg Subcutaneous Q24H  . ferrous sulfate  325 mg Oral Q breakfast  . memantine  10 mg Oral BID  . metoprolol tartrate  50 mg Oral BID  . pantoprazole  40 mg Oral BID AC  . QUEtiapine  25 mg Oral QHS  . vitamin E  400 Units Oral Daily   Infusions:  . azithromycin    . cefTRIAXone (ROCEPHIN)  IV      Assessment: 84 y/o female admitted with shortness of breath, weakness. History of Afib, previously on warfarin several years ago however no anticoagulation recently due to hx GI bleed. Pharmacy initially consulted to restart warfarin, patient received 2.5 mg x 1 yesterday before order discontinued. INR 1.38  today. Currently rate-controlled on metoprolol and diltiazem.   Now pharmacy consulted to dose enoxaparin for VTE prophylaxis. CrCl > 30, weight > 45 kg, no recent trauma noted.  Goal of Therapy:  Monitor platelets by anticoagulation protocol: Yes   Plan:  Enoxaparin 40 mg [place] q24 hours Monitor CBC, s/sx of bleeding   Charlene Brooke, PharmD PGY1 Pharmacy Resident Phone: [phone] After 3:30PM please call Main Pharmacy [phone] 07/16/2017,9:07 AM

## 2017-07-17 ENCOUNTER — Inpatient Hospital Stay (HOSPITAL_COMMUNITY): Payer: Medicare HMO

## 2017-07-17 DIAGNOSIS — I361 Nonrheumatic tricuspid (valve) insufficiency: Secondary | ICD-10-CM

## 2017-07-17 LAB — ECHOCARDIOGRAM COMPLETE
AOASC: 31 cm
Area-P 1/2: 2.65 cm2
EWDT: 268 ms
FS: 38 % (ref 28–44)
HEIGHTINCHES: 66 in
IV/PV OW: 0.91
LA diam end sys: 49 mm
LA vol: 107 mL
LADIAMINDEX: 2.66 cm/m2
LASIZE: 49 mm
LAVOLA4C: 106 mL
LAVOLIN: 58.1 mL/m2
LV PW d: 14.8 mm — AB (ref 0.6–1.1)
LVOT area: 2.54 cm2
LVOT diameter: 18 mm
MV Dec: 268
MV Peak grad: 19 mmHg
MV pk E vel: 219 m/s
MVSPHT: 91 ms
P 1/2 time: 544 ms
Reg peak vel: 379 cm/s
TAPSE: 21.4 mm
TR max vel: 379 cm/s
WEIGHTICAEL: 2536 [oz_av]

## 2017-07-17 LAB — CBC
HCT: 30 % — ABNORMAL LOW (ref 36.0–46.0)
Hemoglobin: 9.5 g/dL — ABNORMAL LOW (ref 12.0–15.0)
MCH: 27.9 pg (ref 26.0–34.0)
MCHC: 31.7 g/dL (ref 30.0–36.0)
MCV: 88 fL (ref 78.0–100.0)
PLATELETS: 369 10*3/uL (ref 150–400)
RBC: 3.41 MIL/uL — AB (ref 3.87–5.11)
RDW: 14.4 % (ref 11.5–15.5)
WBC: 13 10*3/uL — AB (ref 4.0–10.5)

## 2017-07-17 LAB — BASIC METABOLIC PANEL
ANION GAP: 12 (ref 5–15)
BUN: 11 mg/dL (ref 6–20)
CO2: 21 mmol/L — ABNORMAL LOW (ref 22–32)
Calcium: 8.3 mg/dL — ABNORMAL LOW (ref 8.9–10.3)
Chloride: 102 mmol/L (ref 101–111)
Creatinine, Ser: 1.1 mg/dL — ABNORMAL HIGH (ref 0.44–1.00)
GFR, EST AFRICAN AMERICAN: 50 mL/min — AB (ref >60)
GFR, EST NON AFRICAN AMERICAN: 43 mL/min — AB (ref >60)
Glucose, Bld: 102 mg/dL — ABNORMAL HIGH (ref 65–99)
POTASSIUM: 3.3 mmol/L — AB (ref 3.5–5.1)
SODIUM: 135 mmol/L (ref 135–145)

## 2017-07-17 LAB — PROTIME-INR
INR: 1.38
PROTHROMBIN TIME: 16.9 s — AB (ref 11.4–15.2)

## 2017-07-17 MED ORDER — AZITHROMYCIN 500 MG PO TABS
500.0000 mg | ORAL_TABLET | Freq: Every day | ORAL | Status: DC
  Administered 2017-07-18 – 2017-07-19 (×2): 500 mg via ORAL
  Filled 2017-07-17 (×2): qty 1

## 2017-07-17 NOTE — Progress Notes (Signed)
PROGRESS NOTE    SUNDAI PROBERT  ZHG:[id number] DOB: 01/24/1925 DOA: 07/15/2017 PCP: [name], MD   Chief Complaint  Patient presents with  . Weakness  . Cough    Brief Narrative:  HPI on 07/15/2017 by Dr. Gean Birchwood Jasmine [name] is a 84 y.o. female with history of sick sinus node status post pacemaker placement, chronic kidney disease stage III, chronic anemia, dementia was brought to the ER after patient had persistent shortness of breath.  Patient has been having persistent shortness of breath over the last 2 weeks with some cough and exertional symptoms.  Off and on and has had some chest pain.  Patient had gone to her PCP yesterday and was prescribed antibiotic for possible pneumonia.  In the office at the PCPs patient influenza was negative per the patient.  Despite which patient symptoms did not improve and came to the ER.  Interim history Admitted for Acute respiratory failure secondary to pneumonia and CHF Assessment & Plan   Acute respiratory failure -Likely multifactorial including pneumonia and CHF -Continue to treat underlying conditions -No hypoxia noted.  Patient has had shortness of breath which is been persistent for the last 2 weeks -duonebs ordered PRN  Sepsis secondary to community-acquired pneumonia -Upon admission, patient was febrile wit tachypnea and leukocytosis  -continue azithromycin and ceftriaxone -Influenza PCR negative -Strep pneumonia urine antigen negative -Legionella urine antigen pending  Chronic diastolic CHF -Chest x-ray showed possible fluid overload/interstitial edema -Patient does not appear to be volume overloaded at this time -BNP 366 -Lasix currently held due to sepsis -Monitor intake and output, daily weights -Echocardiogram 06/24/2014 showed an EF of 50-55% mild hypokinesis of the anterior septal myocardium. -Repeat echocardiogram pending  Chronic atrial fibrillation -Currently appears to be paced on cardiac  monitoring -Continue Cardizem -Does not appear to be on Coumadin due to GI bleed  Pulmonary hypertension -Echocardiogram pending  History of mitral valve replacement with bioprosthetic valve  History of hypertrophic obstructive cardiomyopathy -Status post myomectomy -Echocardiogram pending -Last echocardiogram in 06/24/2014 showed hypokinesis of the mid and distal anterior septal myocardium consistent with prior septal myomectomy for HOCM.  EF of 50-55%  Chronic anemia -Hemoglobin currently 9.5, no labs since 2016 -Continue iron supplementation -Continue to monitor CBC  Chronic kidney disease, stage III -Creatinine currently appears stable, 1.10, continue to monitor BMP  Depression -Continue Seroquel  Dementia -Stable, continue Namenda  DVT Prophylaxis Lovenox  Code Status: Full  Family Communication: None at bedside  Disposition Plan: Admitted. Pending improvement. Likely discharge in 24-48 hours  Consultants None  Procedures  None  Antibiotics   Anti-infectives (From admission, onward)   Start     Dose/Rate Route Frequency Ordered Stop   07/16/17 2000  cefTRIAXone (ROCEPHIN) 1 g in dextrose 5 % 50 mL IVPB     1 g 100 mL/hr over 30 Minutes Intravenous Every 24 hours 07/15/17 2204 07/23/17 1959   07/16/17 2000  azithromycin (ZITHROMAX) 500 mg in dextrose 5 % 250 mL IVPB     500 mg 250 mL/hr over 60 Minutes Intravenous Every 24 hours 07/15/17 2204 07/23/17 1959   07/15/17 1945  cefTRIAXone (ROCEPHIN) 1 g in dextrose 5 % 50 mL IVPB     1 g 100 mL/hr over 30 Minutes Intravenous  Once 07/15/17 1934 07/15/17 2100   07/15/17 1945  azithromycin (ZITHROMAX) 500 mg in dextrose 5 % 250 mL IVPB     500 mg 250 mL/hr over 60 Minutes Intravenous  Once 07/15/17 1934 07/15/17  2121      Subjective:   Jasmine [name] seen and examined today.  With history of dementia.  Patient feels her breathing has improved.  Continues to have occasional cough.  Denies chest pain, abdominal  pain, nausea or vomiting, diarrhea or constipation.  Objective:   Vitals:   07/16/17 1659 07/16/17 2127 07/16/17 2129 07/17/17 0450  BP:   133/70 120/64  Pulse:  81 77 70  Resp:  18 18 18   Temp:   98 F (36.7 C) 98.4 F (36.9 C)  TempSrc:   Oral Oral  SpO2: 94% 96% 100% 97%  Weight:    71.9 kg (158 lb 8 oz)  Height:        Intake/Output Summary (Last 24 hours) at 07/17/2017 1040 Last data filed at 07/17/2017 0840 Gross per 24 hour  Intake 900 ml  Output 600 ml  Net 300 ml   Filed Weights   07/15/17 2145 07/16/17 0348 07/17/17 0450  Weight: 74 kg (163 lb 2.3 oz) 74.1 kg (163 lb 5.8 oz) 71.9 kg (158 lb 8 oz)   Exam  General: Well developed, well nourished, NAD, appears stated age  HEENT: NCAT, mucous membranes moist.   Cardiovascular: S1 S2 auscultated, RRR.  Respiratory: Diminished however clear, occasional cough.  No wheezing  Abdomen: Soft, nontender, nondistended, + bowel sounds  Extremities: warm dry without cyanosis clubbing or edema  Neuro: AAOx2, h/o dementia, nonfocal  Psych: Appropriate and pleasant  Data Reviewed: I have personally reviewed following labs and imaging studies  CBC: Recent Labs  Lab 07/15/17 1826 07/16/17 0429 07/17/17 0620  WBC 22.3* 14.8* 13.0*  NEUTROABS 19.7*  --   --   HGB 9.5* 9.3* 9.5*  HCT 30.1* 29.5* 30.0*  MCV 88.8 88.3 88.0  PLT 359 329 086   Basic Metabolic Panel: Recent Labs  Lab 07/15/17 1826 07/15/17 2209 07/16/17 0429 07/17/17 0620  NA 136  --  135 135  K 4.1  --  3.4* 3.3*  CL 103  --  102 102  CO2 19*  --  21* 21*  GLUCOSE 97  --  91 102*  BUN 13  --  13 11  CREATININE 1.08*  --  1.13* 1.10*  CALCIUM 8.7*  --  8.5* 8.3*  MG  --  1.9  --   --    GFR: Estimated Creatinine Clearance: 34.5 mL/min (A) (by C-G formula based on SCr of 1.1 mg/dL (H)). Liver Function Tests: Recent Labs  Lab 07/15/17 1826  AST 25  ALT 18  ALKPHOS 74  BILITOT 0.9  PROT 7.3  ALBUMIN 2.9*   No results for input(s):  LIPASE, AMYLASE in the last 168 hours. No results for input(s): AMMONIA in the last 168 hours. Coagulation Profile: Recent Labs  Lab 07/16/17 0429 07/17/17 0620  INR 1.38 1.38   Cardiac Enzymes: Recent Labs  Lab 07/15/17 1826 07/15/17 2209 07/16/17 0429 07/16/17 0943  TROPONINI 0.05* 0.03* 0.03* 0.03*   BNP (last 3 results) No results for input(s): PROBNP in the last 8760 hours. HbA1C: No results for input(s): HGBA1C in the last 72 hours. CBG: No results for input(s): GLUCAP in the last 168 hours. Lipid Profile: No results for input(s): CHOL, HDL, LDLCALC, TRIG, CHOLHDL, LDLDIRECT in the last 72 hours. Thyroid Function Tests: Recent Labs    07/15/17 2209  TSH 2.314   Anemia Panel: No results for input(s): VITAMINB12, FOLATE, FERRITIN, TIBC, IRON, RETICCTPCT in the last 72 hours. Urine analysis:    Component Value  Date/Time   COLORURINE YELLOW 04/22/2015 [place] 04/22/2015 1705   LABSPEC 1.024 04/22/2015 1705   PHURINE 6.0 04/22/2015 [place] 04/22/2015 1705   HGBUR NEGATIVE 04/22/2015 1705   BILIRUBINUR NEGATIVE 04/22/2015 [place] 04/22/2015 [place] 04/22/2015 1705   UROBILINOGEN 1.0 03/27/2015 2058   NITRITE NEGATIVE 04/22/2015 1705   LEUKOCYTESUR NEGATIVE 04/22/2015 1705   Sepsis Labs: @LABRCNTIP (procalcitonin:4,lacticidven:4)  ) Recent Results (from the past 240 hour(s))  Culture, blood (routine x 2)     Status: None (Preliminary result)   Collection Time: 07/15/17  6:30 PM  Result Value Ref Range Status   Specimen Description BLOOD LEFT HAND  Final   Special Requests   Final    BOTTLES DRAWN AEROBIC AND ANAEROBIC Blood Culture adequate volume   Culture   Final    NO GROWTH 2 DAYS Performed at [facility], [place] [address] [place], [place] [address]    Report Status PENDING  Incomplete  Culture, blood (routine x 2)     Status: None (Preliminary result)   Collection Time:  07/15/17  8:10 PM  Result Value Ref Range Status   Specimen Description BLOOD BLOOD RIGHT HAND  Final   Special Requests IN PEDIATRIC BOTTLE Blood Culture adequate volume  Final   Culture   Final    NO GROWTH 2 DAYS Performed at [facility], [place] [address] [place], [place] [address]    Report Status PENDING  Incomplete  Culture, sputum-assessment     Status: None   Collection Time: 07/16/17 11:21 AM  Result Value Ref Range Status   Specimen Description SPUTUM  Final   Special Requests NONE  Final   Sputum evaluation   Final    Sputum specimen not acceptable for testing.  Please recollect.   Gram Stain Report Called to,Read Back By and Verified With: L MCELWEE,RN AT 7824 07/16/17 BY L BENFIELD Performed at [facility], [place] [address] [place], [place] [address]    Report Status 07/16/2017 FINAL  Final      Radiology Studies: Dg Chest 2 View  Result Date: 07/15/2017 CLINICAL DATA:  Cough and shortness of breath since yesterday. EXAM: CHEST  2 VIEW COMPARISON:  07/14/2017 FINDINGS: Previous median sternotomy. Dual lead pacemaker appears the same. There is chronic pulmonary scarring. Interstitial markings do appear more prominent however, suggesting developing interstitial edema. No alveolar edema, collapse or effusion. Bony structures are unremarkable. IMPRESSION: Chronic interstitial lung markings, but with increased prominence suggesting fluid overload/interstitial edema. Electronically Signed   By: Nelson Chimes M.D.   On: 07/15/2017 19:10     Scheduled Meds: . brimonidine  1 drop Both Eyes Daily   And  . timolol  1 drop Both Eyes Daily  . budesonide  0.25 mg Nebulization BID  . diltiazem  120 mg Oral Daily  . dorzolamide-timolol  1 drop Both Eyes BID  . enoxaparin (LOVENOX) injection  40 mg Subcutaneous Q24H  . ferrous sulfate  325 mg Oral Q breakfast  . memantine  10 mg Oral BID  . metoprolol tartrate  50 mg Oral BID  . pantoprazole  40 mg Oral BID AC  .  QUEtiapine  25 mg Oral QHS  . vitamin E  400 Units Oral Daily   Continuous Infusions: . azithromycin Stopped (07/17/17 0151)  . cefTRIAXone (ROCEPHIN)  IV Stopped (07/17/17 0151)     LOS: 2 days   Time Spent  in minutes   30 minutes  Elener Custodio D.O. on 07/17/2017 at 10:40 AM  Between 7am to 7pm - Pager - [phone]  After 7pm go to [email or web address] - password [email or web address]  And look for the night coverage person covering for me after hours  [facility] Office  [phone]

## 2017-07-17 NOTE — Plan of Care (Signed)
  Skin Integrity: Risk for impaired skin integrity will decrease 07/17/2017 2240 - Progressing by Theador Hawthorne, RN   Safety: Ability to remain free from injury will improve 07/17/2017 2240 - Progressing by Theador Hawthorne, RN   Pain Managment: General experience of comfort will improve 07/17/2017 2240 - Progressing by Theador Hawthorne, RN

## 2017-07-17 NOTE — Progress Notes (Signed)
  Echocardiogram 2D Echocardiogram has been performed.  Jasmine [name] 07/17/2017, 2:43 PM

## 2017-07-18 ENCOUNTER — Inpatient Hospital Stay (HOSPITAL_COMMUNITY): Payer: Medicare HMO

## 2017-07-18 DIAGNOSIS — I272 Pulmonary hypertension, unspecified: Secondary | ICD-10-CM

## 2017-07-18 DIAGNOSIS — I421 Obstructive hypertrophic cardiomyopathy: Secondary | ICD-10-CM

## 2017-07-18 DIAGNOSIS — I482 Chronic atrial fibrillation: Secondary | ICD-10-CM

## 2017-07-18 DIAGNOSIS — Z953 Presence of xenogenic heart valve: Secondary | ICD-10-CM

## 2017-07-18 DIAGNOSIS — I10 Essential (primary) hypertension: Secondary | ICD-10-CM

## 2017-07-18 LAB — BASIC METABOLIC PANEL
ANION GAP: 11 (ref 5–15)
BUN: 8 mg/dL (ref 6–20)
CHLORIDE: 104 mmol/L (ref 101–111)
CO2: 22 mmol/L (ref 22–32)
Calcium: 8.3 mg/dL — ABNORMAL LOW (ref 8.9–10.3)
Creatinine, Ser: 1.03 mg/dL — ABNORMAL HIGH (ref 0.44–1.00)
GFR calc non Af Amer: 46 mL/min — ABNORMAL LOW (ref >60)
GFR, EST AFRICAN AMERICAN: 54 mL/min — AB (ref >60)
GLUCOSE: 101 mg/dL — AB (ref 65–99)
Potassium: 3.4 mmol/L — ABNORMAL LOW (ref 3.5–5.1)
Sodium: 137 mmol/L (ref 135–145)

## 2017-07-18 LAB — PROTIME-INR
INR: 1.3
Prothrombin Time: 16.1 seconds — ABNORMAL HIGH (ref 11.4–15.2)

## 2017-07-18 LAB — CBC
HEMATOCRIT: 28.9 % — AB (ref 36.0–46.0)
HEMOGLOBIN: 9.2 g/dL — AB (ref 12.0–15.0)
MCH: 27.9 pg (ref 26.0–34.0)
MCHC: 31.8 g/dL (ref 30.0–36.0)
MCV: 87.6 fL (ref 78.0–100.0)
Platelets: 387 10*3/uL (ref 150–400)
RBC: 3.3 MIL/uL — ABNORMAL LOW (ref 3.87–5.11)
RDW: 14.4 % (ref 11.5–15.5)
WBC: 11.8 10*3/uL — AB (ref 4.0–10.5)

## 2017-07-18 LAB — MAGNESIUM: Magnesium: 2.1 mg/dL (ref 1.7–2.4)

## 2017-07-18 LAB — LEGIONELLA PNEUMOPHILA SEROGP 1 UR AG: L. pneumophila Serogp 1 Ur Ag: NEGATIVE

## 2017-07-18 MED ORDER — FUROSEMIDE 10 MG/ML IJ SOLN
20.0000 mg | Freq: Once | INTRAMUSCULAR | Status: AC
  Administered 2017-07-18: 20 mg via INTRAVENOUS
  Filled 2017-07-18: qty 2

## 2017-07-18 MED ORDER — POTASSIUM CHLORIDE CRYS ER 20 MEQ PO TBCR
40.0000 meq | EXTENDED_RELEASE_TABLET | Freq: Once | ORAL | Status: AC
  Administered 2017-07-18: 40 meq via ORAL
  Filled 2017-07-18: qty 2

## 2017-07-18 MED ORDER — SODIUM CHLORIDE 0.9 % IV SOLN
1.0000 g | INTRAVENOUS | Status: DC
  Administered 2017-07-18: 1 g via INTRAVENOUS
  Filled 2017-07-18 (×2): qty 10

## 2017-07-18 NOTE — Progress Notes (Signed)
PROGRESS NOTE    Jasmine [name]  GXQ:[id number] DOB: [birth year] DOA: 07/15/2017 PCP: [name], MD   Chief Complaint  Patient presents with  . Weakness  . Cough    Brief Narrative:  HPI on 07/15/2017 by Dr. Gean Birchwood Jasmine [name] is a 84 y.o. female with history of sick sinus node status post pacemaker placement, chronic kidney disease stage III, chronic anemia, dementia was brought to the ER after patient had persistent shortness of breath.  Patient has been having persistent shortness of breath over the last 2 weeks with some cough and exertional symptoms.  Off and on and has had some chest pain.  Patient had gone to her PCP yesterday and was prescribed antibiotic for possible pneumonia.  In the office at the PCPs patient influenza was negative per the patient.  Despite which patient symptoms did not improve and came to the ER.  Interim history Admitted for Acute respiratory failure secondary to pneumonia and CHF Assessment & Plan   Acute respiratory failure -Likely multifactorial including pneumonia and CHF -Continue to treat underlying conditions -No hypoxia noted.  Patient has had shortness of breath which is been persistent for the last 2 weeks -duonebs ordered PRN  Sepsis secondary to community-acquired pneumonia -Upon admission, patient was febrile wit tachypnea and leukocytosis  -was placed azithromycin and ceftriaxone-will transition to oral antibiotics -Influenza PCR negative -Strep pneumonia urine antigen negative  -Legionella urine antigen pending -Repeat chest x-ray showed some interstitial lung disease, chronic.  Mild volume overload or edema -Suspect patient had either pneumonia or upper respiratory infection prior to admission as patient had shortness of breath with cough approximately 2 weeks  Chronic diastolic CHF -Chest x-ray showed possible fluid overload/interstitial edema -Patient does not appear to be volume overloaded at this time -BNP  366 -Lasix currently held due to sepsis -Monitor intake and output, daily weights -Echocardiogram 06/24/2014 showed an EF of 50-55% mild hypokinesis of the anterior septal myocardium. -Echocardiogram showed EF of 60-65%, no regional wall motion abnormalities -Repeat chest x-ray noted above, will place patient on low-dose IV Lasix  Chronic atrial fibrillation -Currently appears to be paced on cardiac monitoring -Continue Cardizem -Does not appear to be on Coumadin due to GI bleed  Pulmonary hypertension -Echocardiogram showed systolic pressure is moderately increased.  PA peak pressure 60 mmHg  History of mitral valve replacement with bioprosthetic valve -Echocardiogram showed bioprosthesis present.  Process appears to be well seated, without rocking motion.  At least moderate mitral insufficiency, most of which appears to be perivalvular at the posterior aspect of the prosthetic annulus.  Consider transesophageal echocardiogram to evaluate mitral perivalvular regurgitation. -Patient currently afebrile with improving leukocytosis. Blood cultures remain negative -Will discuss further with cardiology and family  History of hypertrophic obstructive cardiomyopathy -Status post myomectomy -Last echocardiogram in 06/24/2014 showed hypokinesis of the mid and distal anterior septal myocardium consistent with prior septal myomectomy for HOCM.  EF of 50-55%  Chronic anemia -Hemoglobin currently 9.2, no labs since 2016 -Continue iron supplementation -Continue to monitor CBC  Chronic kidney disease, stage III -Creatinine currently appears stable, 1.03, continue to monitor BMP  Depression -Continue Seroquel  Dementia -Stable, continue Namenda  DVT Prophylaxis Lovenox  Code Status: Full  Family Communication: None at bedside. Daughter via phone  Disposition Plan: Admitted. Pending improvement. Likely discharge in 24-48 hours  Consultants Cardiology   Procedures   Echocardiogram  Antibiotics   Anti-infectives (From admission, onward)   Start     Dose/Rate Route Frequency Ordered  Stop   07/18/17 2000  cefTRIAXone (ROCEPHIN) 1 g in sodium chloride 0.9 % 100 mL IVPB     1 g 200 mL/hr over 30 Minutes Intravenous Every 24 hours 07/18/17 0901 07/23/17 1959   07/18/17 1000  azithromycin (ZITHROMAX) tablet 500 mg     500 mg Oral Daily 07/17/17 1328 07/24/17 0959   07/16/17 2000  cefTRIAXone (ROCEPHIN) 1 g in dextrose 5 % 50 mL IVPB  Status:  Discontinued     1 g 100 mL/hr over 30 Minutes Intravenous Every 24 hours 07/15/17 2204 07/18/17 0902   07/16/17 2000  azithromycin (ZITHROMAX) 500 mg in dextrose 5 % 250 mL IVPB  Status:  Discontinued     500 mg 250 mL/hr over 60 Minutes Intravenous Every 24 hours 07/15/17 2204 07/17/17 1328   07/15/17 1945  cefTRIAXone (ROCEPHIN) 1 g in dextrose 5 % 50 mL IVPB     1 g 100 mL/hr over 30 Minutes Intravenous  Once 07/15/17 1934 07/15/17 2100   07/15/17 1945  azithromycin (ZITHROMAX) 500 mg in dextrose 5 % 250 mL IVPB     500 mg 250 mL/hr over 60 Minutes Intravenous  Once 07/15/17 1934 07/15/17 2121      Subjective:   [name] seen and examined today.  Patient feels breathing is mildly better, not back to baseline. States she just came back from xray so she feels short winded.  Denies current chest pain, abdominal pain, nausea or vomiting, diarrhea or constipation.  Objective:   Vitals:   07/17/17 2131 07/18/17 0314 07/18/17 0841 07/18/17 1006  BP: (!) 142/56 110/79 106/61   Pulse: 81 79 74   Resp:  20    Temp:  98.7 F (37.1 C)    TempSrc:  Oral    SpO2:  97%  97%  Weight:  71.8 kg (158 lb 3.2 oz)    Height:        Intake/Output Summary (Last 24 hours) at 07/18/2017 1016 Last data filed at 07/18/2017 0949 Gross per 24 hour  Intake 230 ml  Output 675 ml  Net -445 ml   Filed Weights   07/16/17 0348 07/17/17 0450 07/18/17 0314  Weight: 74.1 kg (163 lb 5.8 oz) 71.9 kg (158 lb 8 oz) 71.8 kg (158  lb 3.2 oz)   Exam  General: Well developed, well nourished, NAD, appears stated age  27: NCAT, mucous membranes moist.   Cardiovascular: S1 S2 auscultated, RRR  Respiratory: Diminished breath sounds, occasional cough, no wheezing  Abdomen: Soft, nontender, nondistended, + bowel sounds  Extremities: warm dry without cyanosis clubbing or edema  Neuro: AAOx2, h/o dementia, nonfocal  Psych:, appropriate  Data Reviewed: I have personally reviewed following labs and imaging studies  CBC: Recent Labs  Lab 07/15/17 1826 07/16/17 0429 07/17/17 0620 07/18/17 0534  WBC 22.3* 14.8* 13.0* 11.8*  NEUTROABS 19.7*  --   --   --   HGB 9.5* 9.3* 9.5* 9.2*  HCT 30.1* 29.5* 30.0* 28.9*  MCV 88.8 88.3 88.0 87.6  PLT 359 329 369 161   Basic Metabolic Panel: Recent Labs  Lab 07/15/17 1826 07/15/17 2209 07/16/17 0429 07/17/17 0620 07/18/17 0534  NA 136  --  135 135 137  K 4.1  --  3.4* 3.3* 3.4*  CL 103  --  102 102 104  CO2 19*  --  21* 21* 22  GLUCOSE 97  --  91 102* 101*  BUN 13  --  13 11 8   CREATININE 1.08*  --  1.13* 1.10* 1.03*  CALCIUM 8.7*  --  8.5* 8.3* 8.3*  MG  --  1.9  --   --   --    GFR: Estimated Creatinine Clearance: 36.8 mL/min (A) (by C-G formula based on SCr of 1.03 mg/dL (H)). Liver Function Tests: Recent Labs  Lab 07/15/17 1826  AST 25  ALT 18  ALKPHOS 74  BILITOT 0.9  PROT 7.3  ALBUMIN 2.9*   No results for input(s): LIPASE, AMYLASE in the last 168 hours. No results for input(s): AMMONIA in the last 168 hours. Coagulation Profile: Recent Labs  Lab 07/16/17 0429 07/17/17 0620 07/18/17 0534  INR 1.38 1.38 1.30   Cardiac Enzymes: Recent Labs  Lab 07/15/17 1826 07/15/17 2209 07/16/17 0429 07/16/17 0943  TROPONINI 0.05* 0.03* 0.03* 0.03*   BNP (last 3 results) No results for input(s): PROBNP in the last 8760 hours. HbA1C: No results for input(s): HGBA1C in the last 72 hours. CBG: No results for input(s): GLUCAP in the last 168  hours. Lipid Profile: No results for input(s): CHOL, HDL, LDLCALC, TRIG, CHOLHDL, LDLDIRECT in the last 72 hours. Thyroid Function Tests: Recent Labs    07/15/17 2209  TSH 2.314   Anemia Panel: No results for input(s): VITAMINB12, FOLATE, FERRITIN, TIBC, IRON, RETICCTPCT in the last 72 hours. Urine analysis:    Component Value Date/Time   COLORURINE YELLOW 04/22/2015 [place] 04/22/2015 1705   LABSPEC 1.024 04/22/2015 1705   PHURINE 6.0 04/22/2015 1705   GLUCOSEU NEGATIVE 04/22/2015 1705   HGBUR NEGATIVE 04/22/2015 1705   BILIRUBINUR NEGATIVE 04/22/2015 1705   KETONESUR NEGATIVE 04/22/2015 [place] 04/22/2015 1705   UROBILINOGEN 1.0 03/27/2015 2058   NITRITE NEGATIVE 04/22/2015 1705   LEUKOCYTESUR NEGATIVE 04/22/2015 1705   Sepsis Labs: @LABRCNTIP (procalcitonin:4,lacticidven:4)  ) Recent Results (from the past 240 hour(s))  Culture, blood (routine x 2)     Status: None (Preliminary result)   Collection Time: 07/15/17  6:30 PM  Result Value Ref Range Status   Specimen Description BLOOD LEFT HAND  Final   Special Requests   Final    BOTTLES DRAWN AEROBIC AND ANAEROBIC Blood Culture adequate volume   Culture   Final    NO GROWTH 2 DAYS Performed at [facility], [place] [address], [place], [place] [address]    Report Status PENDING  Incomplete  Culture, blood (routine x 2)     Status: None (Preliminary result)   Collection Time: 07/15/17  8:10 PM  Result Value Ref Range Status   Specimen Description BLOOD BLOOD RIGHT HAND  Final   Special Requests IN PEDIATRIC BOTTLE Blood Culture adequate volume  Final   Culture   Final    NO GROWTH 2 DAYS Performed at [facility], [place] [address] [place], [place] [address]    Report Status PENDING  Incomplete  Culture, sputum-assessment     Status: None   Collection Time: 07/16/17 11:21 AM  Result Value Ref Range Status   Specimen Description SPUTUM  Final   Special Requests NONE   Final   Sputum evaluation   Final    Sputum specimen not acceptable for testing.  Please recollect.   Gram Stain Report Called to,Read Back By and Verified With: L MCELWEE,RN AT 5643 07/16/17 BY L BENFIELD Performed at [facility], [place] [address], [place], [place] [address]    Report Status 07/16/2017 FINAL  Final      Radiology Studies: Dg Chest 2  View  Result Date: 07/18/2017 CLINICAL DATA:  Shortness of breath, atrial fibrillation, asthma EXAM: CHEST  2 VIEW COMPARISON:  07/15/2017 FINDINGS: Previous median sternotomy and left subclavian 2 lead pacer noted. Similar pattern of chronic interstitial reticulonodular opacities, worse in the right upper lobe and throughout the left lung. No superimposed collapse, consolidation, or pneumonia. No large effusion or pneumothorax. Trachea is midline. Atherosclerosis of the aorta. Degenerative changes of the spine with a mild scoliosis. IMPRESSION: Similar chronic interstitial lung disease pattern. Difficult to exclude mild volume overload or edema. Electronically Signed   By: Jerilynn Mages.  Shick M.D.   On: 07/18/2017 07:49     Scheduled Meds: . azithromycin  500 mg Oral Daily  . brimonidine  1 drop Both Eyes Daily   And  . timolol  1 drop Both Eyes Daily  . budesonide  0.25 mg Nebulization BID  . diltiazem  120 mg Oral Daily  . dorzolamide-timolol  1 drop Both Eyes BID  . enoxaparin (LOVENOX) injection  40 mg Subcutaneous Q24H  . ferrous sulfate  325 mg Oral Q breakfast  . memantine  10 mg Oral BID  . metoprolol tartrate  50 mg Oral BID  . pantoprazole  40 mg Oral BID AC  . QUEtiapine  25 mg Oral QHS  . vitamin E  400 Units Oral Daily   Continuous Infusions: . cefTRIAXone (ROCEPHIN)  IV       LOS: 3 days   Time Spent in minutes   30 minutes  Eliyana Pagliaro D.O. on 07/18/2017 at 10:16 AM  Between 7am to 7pm - Pager - [phone]  After 7pm go to [email or web address] - password [email or web address]  And look for the night coverage person covering for me  after hours  [facility] Office  [phone]

## 2017-07-18 NOTE — Progress Notes (Signed)
Patients daughter came at this time, unable to paged Dr. Loletha Grayer since its already late. [name] [phone], she said MD can call her anytime tomorrow.

## 2017-07-18 NOTE — Consult Note (Signed)
Cardiology Consultation:   Patient ID: Jasmine [name]; [id number]; 11/22/1924   Admit date: 07/15/2017 Date of Consult: 07/18/2017  Primary Care Provider: [name], MD Primary Cardiologist: Dr. Radford Pax Primary Electrophysiologist:  Dr. Caryl Comes   Patient Profile:   Jasmine [name] is a 84 y.o. female with a hx of hypertrophic obstructive cardiomyopathy status post septal myomectomy and bioprosthetic mitral valve replacement, SSS s/p pacemaker, chronic afib (not on anticoagulation due to prior GI bleed, HTN, CKD stage III, HLD who is being seen today for the evaluation of abnormal echo at the request of Dr. Ree [name].   Admitted 03/2015 for NSTEMI. Catheterization was performed revealing minimal nonobstructive disease. It was felt that her troponin rise was most likely secondary to demand ischemia in the setting of A. fib with rapid rates and hypertrophic cardiomyopathy.   History of Present Illness:   Jasmine [name] presented for worsening SOB with orhtopnea x 3 weeks prior to admission 07/15/17. She was started on abx by PCP on 07/14/17. Flu negative. She denies any chest pain. She was admitted for sepsis 2nd to CAP and acute CHF. She was started on broad spectrum antibiotic. Blood culture remained negative. She did had fever prior to presentation. She is able to walk around house without exacerbation of her symptoms.   Echo yesterday showed normal pumping function of heart without WM abnormality. Perivalvular mitral regurgitation. PA pressure of 35mm Hg. Mild dilated Ra, moderate to severe dilated LA.   Troponin 0.03 x3. K of 3.4. Hgb stable at 9.2. TSH normal.   Past Medical History:  Diagnosis Date  . Aortic insufficiency    a. 06/2014 Echo: Mild AI.  Marland Kitchen Asthma   . Atrial fibrillation and flutter    a. atrial fibrillation s/p DCCV 06-2012;  b. now in chronic atrial fibrillation off anticoagulation due to GI bleed.  . Bradycardia    a. 10/2011 s/p SJM [facility] DR DC PPM, ser #: [id number].  Marland Kitchen Chronic  kidney disease (CKD), stage III (moderate) (HCC)   . Dementia   . Diverticulosis of colon with hemorrhage 08/12/2012  . Essential hypertension   . GERD (gastroesophageal reflux disease)   . Glaucoma   . History of GI Bleed    a. prevents use of [place].  Marland Kitchen History of Severe Mitral Regurgitation    a. s/p MVR with pericardial tissue valve;  b. 06/2014 Mild Mitral Stenosis by echo.  Marland Kitchen HOCM (hypertrophic obstructive cardiomyopathy) ([place])    a. s/p septal myomectomy;  b. 06/2014 Echo: EF 50-55%, mild antsept HK, mild MR, mild MS/MR (bioprosth valve), mildly dil LA, mod TR, PASP 50mmHg.  Marland Kitchen Hyperlipidemia   . Insomnia   . Non-obstructive coronary artery disease    a. 06/2014 NSTEMI/Cath: LM nl, LAD nl, LCX nl, RCA 10-65m, EF 45%.  . Pulmonary HTN ([place])    a. mild to moderate (PASP 40mmHg by echo 06/2014).  . Reflux esophagitis     Past Surgical History:  Procedure Laterality Date  . APPENDECTOMY    . CARDIAC CATHETERIZATION    . CARDIAC CATHETERIZATION N/A 03/27/2015   Procedure: Left Heart Cath and Coronary Angiography;  Surgeon: Leonie Man, MD;  Location: [facility];  Service: Cardiovascular;  Laterality: N/A;  . CARDIAC VALVE REPLACEMENT  2007   MVR  . CARDIOVERSION  07/04/2012   Procedure: CARDIOVERSION;  Surgeon: Sueanne Margarita, MD;  Location: [facility];  Service: Cardiovascular;  Laterality: N/A;  h/p in file drawer   . CATARACT EXTRACTION W/ INTRAOCULAR  LENS  IMPLANT, BILATERAL    . CHOLECYSTECTOMY    . COLON RESECTION    . COLONOSCOPY    . COLONOSCOPY N/A 08/13/2012   Procedure: COLONOSCOPY;  Surgeon: Gatha Mayer, MD;  Location: [facility];  Service: Endoscopy;  Laterality: N/A;  . HERNIA REPAIR     "in my stomach"  . LEFT HEART CATHETERIZATION WITH CORONARY ANGIOGRAM N/A 06/11/2014   Procedure: LEFT HEART CATHETERIZATION WITH CORONARY ANGIOGRAM;  Surgeon: Troy Sine, MD;  Location: [facility] CATH LAB;  Service: Cardiovascular;  Laterality: N/A;  . MYOMECTOMY  2007    septal  . PERMANENT PACEMAKER INSERTION N/A 10/07/2011   Procedure: PERMANENT PACEMAKER INSERTION;  Surgeon: Deboraha Sprang, MD;  Location: [facility] CATH LAB;  Service: Cardiovascular;  Laterality: N/A;  . TONSILLECTOMY    . TUBAL LIGATION       Inpatient Medications: Scheduled Meds: . azithromycin  500 mg Oral Daily  . brimonidine  1 drop Both Eyes Daily   And  . timolol  1 drop Both Eyes Daily  . budesonide  0.25 mg Nebulization BID  . diltiazem  120 mg Oral Daily  . dorzolamide-timolol  1 drop Both Eyes BID  . enoxaparin (LOVENOX) injection  40 mg Subcutaneous Q24H  . ferrous sulfate  325 mg Oral Q breakfast  . memantine  10 mg Oral BID  . metoprolol tartrate  50 mg Oral BID  . pantoprazole  40 mg Oral BID AC  . QUEtiapine  25 mg Oral QHS  . vitamin E  400 Units Oral Daily   Continuous Infusions: . cefTRIAXone (ROCEPHIN)  IV     PRN Meds: acetaminophen **OR** acetaminophen, fluticasone, guaiFENesin, ipratropium-albuterol, nitroGLYCERIN, ondansetron **OR** ondansetron (ZOFRAN) IV  Allergies:    Allergies  Allergen Reactions  . Cymbalta [Duloxetine Hcl] Diarrhea and Other (See Comments)    weakness  . Dexilant [Dexlansoprazole] Diarrhea and Other (See Comments)    Bloating also    Social History:   Social History   Socioeconomic History  . Marital status: Widowed    Spouse name: Not on file  . Number of children: 3  . Years of education: Not on file  . Highest education level: Not on file  Social Needs  . Financial resource strain: Not on file  . Food insecurity - worry: Not on file  . Food insecurity - inability: Not on file  . Transportation needs - medical: Not on file  . Transportation needs - non-medical: Not on file  Occupational History  . Not on file  Tobacco Use  . Smoking status: Former Smoker    Types: Cigarettes    Last attempt to quit: 06/07/1974    Years since quitting: 43.1  . Smokeless tobacco: Former Systems developer    Types: Snuff    Quit date: 04/23/1975    . Tobacco comment: 10/05/11 "  Substance and Sexual Activity  . Alcohol use: No    Alcohol/week: 0.0 oz    Comment: 10/05/11 "used to drink; quit a long time ago"  . Drug use: No  . Sexual activity: No  Other Topics Concern  . Not on file  Social History Narrative  . Not on file    Family History:    Family History  Problem Relation Age of Onset  . CVA Mother   . Hypertension Mother   . CVA Brother   . Hypertension Brother      ROS:  Please see the history of present illness.   All other ROS  reviewed and negative.     Physical Exam/Data:   Vitals:   07/17/17 2131 07/18/17 0314 07/18/17 0841 07/18/17 1006  BP: (!) 142/56 110/79 106/61   Pulse: 81 79 74   Resp:  20    Temp:  98.7 F (37.1 C)    TempSrc:  Oral    SpO2:  97%  97%  Weight:  158 lb 3.2 oz (71.8 kg)    Height:        Intake/Output Summary (Last 24 hours) at 07/18/2017 1307 Last data filed at 07/18/2017 1258 Gross per 24 hour  Intake 350 ml  Output 677 ml  Net -327 ml   Filed Weights   07/16/17 0348 07/17/17 0450 07/18/17 0314  Weight: 163 lb 5.8 oz (74.1 kg) 158 lb 8 oz (71.9 kg) 158 lb 3.2 oz (71.8 kg)   Body mass index is 25.53 kg/m.  General:  Frail elderly female  in no acute distress HEENT: normal Lymph: no adenopathy Neck: no JVD Endocrine:  No thryomegaly Vascular: No carotid bruits; FA pulses 2+ bilaterally without bruits  Cardiac:  normal S1, S2; IR IR, 2/6 systolic murmur Lungs:  Diminished breath sound throughout, mostly at base with course sound Abd: soft, nontender, no hepatomegaly  Ext: Trace edema Musculoskeletal:  No deformities, BUE and BLE strength normal and equal Skin: warm and dry  Neuro:  CNs 2-12 intact, no focal abnormalities noted Psych:  Normal affect   EKG:  The EKG was personally reviewed and demonstrates: afib at rate of 98 bpm  Telemetry:  Telemetry was personally reviewed and demonstrates: afib at intermittent V packing at controlled ventricular  rate  Relevant CV Studies: Echo 07/17/17 Study Conclusions  - Left ventricle: The cavity size was normal. There was moderate   concentric hypertrophy. Systolic function was normal. The   estimated ejection fraction was in the range of 60% to 65%. Wall   motion was normal; there were no regional wall motion   abnormalities. - Aortic valve: There was mild to moderate regurgitation directed   centrally in the LVOT. - Mitral valve: A bioprosthesis was present. The prosthesis appears   well seated, without rocking motion. There is at least moderate   mitral insufficiency, most of which appears to be perivalvular,   at the posterior aspect of the prosthetic annulus. - Left atrium: The atrium was moderately to severely dilated. - Right atrium: The atrium was mildly dilated. - Tricuspid valve: There was moderate regurgitation directed   centrally. - Pulmonary arteries: Systolic pressure was moderately increased.   PA peak pressure: 60 mm Hg (S).  Recommendations:  1. Consider transesophageal echocardiography to evaluate mitral    perivalvular regurgitation. If the patient has fever or    leukocytosis, blood cultures are appropriate. 2. Due to technical problems, the previous images could not be    reviewed, but periannular mitral insufficiency was not    previously reported.   Left Heart Cath and Coronary Angiography  03/2015  Conclusion   1. Angiographically normal CAD with very tortuous vessels. 2. The left ventricular systolic function is normal. 3. Normal LVEDP    Laboratory Data:  Chemistry Recent Labs  Lab 07/16/17 0429 07/17/17 0620 07/18/17 0534  NA 135 135 137  K 3.4* 3.3* 3.4*  CL 102 102 104  CO2 21* 21* 22  GLUCOSE 91 102* 101*  BUN 13 11 8   CREATININE 1.13* 1.10* 1.03*  CALCIUM 8.5* 8.3* 8.3*  GFRNONAA 41* 43* 46*  GFRAA 48* 50* 54*  ANIONGAP 12 12 11     Recent Labs  Lab 07/15/17 1826  PROT 7.3  ALBUMIN 2.9*  AST 25  ALT 18  ALKPHOS 74   BILITOT 0.9   Hematology Recent Labs  Lab 07/16/17 0429 07/17/17 0620 07/18/17 0534  WBC 14.8* 13.0* 11.8*  RBC 3.34* 3.41* 3.30*  HGB 9.3* 9.5* 9.2*  HCT 29.5* 30.0* 28.9*  MCV 88.3 88.0 87.6  MCH 27.8 27.9 27.9  MCHC 31.5 31.7 31.8  RDW 14.3 14.4 14.4  PLT 329 369 387   Cardiac Enzymes Recent Labs  Lab 07/15/17 1826 07/15/17 2209 07/16/17 0429 07/16/17 0943  TROPONINI 0.05* 0.03* 0.03* 0.03*   No results for input(s): TROPIPOC in the last 168 hours.  BNP Recent Labs  Lab 07/15/17 1826  BNP 366.4*    DDimer No results for input(s): DDIMER in the last 168 hours.  Radiology/Studies:  Dg Chest 2 View  Result Date: 07/18/2017 CLINICAL DATA:  Shortness of breath, atrial fibrillation, asthma EXAM: CHEST  2 VIEW COMPARISON:  07/15/2017 FINDINGS: Previous median sternotomy and left subclavian 2 lead pacer noted. Similar pattern of chronic interstitial reticulonodular opacities, worse in the right upper lobe and throughout the left lung. No superimposed collapse, consolidation, or pneumonia. No large effusion or pneumothorax. Trachea is midline. Atherosclerosis of the aorta. Degenerative changes of the spine with a mild scoliosis. IMPRESSION: Similar chronic interstitial lung disease pattern. Difficult to exclude mild volume overload or edema. Electronically Signed   By: Jerilynn Mages.  Shick M.D.   On: 07/18/2017 07:49   Dg Chest 2 View  Result Date: 07/15/2017 CLINICAL DATA:  Cough and shortness of breath since yesterday. EXAM: CHEST  2 VIEW COMPARISON:  07/14/2017 FINDINGS: Previous median sternotomy. Dual lead pacemaker appears the same. There is chronic pulmonary scarring. Interstitial markings do appear more prominent however, suggesting developing interstitial edema. No alveolar edema, collapse or effusion. Bony structures are unremarkable. IMPRESSION: Chronic interstitial lung markings, but with increased prominence suggesting fluid overload/interstitial edema. Electronically Signed    By: Nelson Chimes M.D.   On: 07/15/2017 19:10   Assessment and Plan:   1. S/p septal myomectomy and bioprosthetic mitral valve replacement - Echo with new perivalvular regurgitation presenting with fever and CAP. She got antibiotics prior to blood culture drown. - Will get TEE for definite evaluation. Tomorrow at 4pm with Dr. Sallyanne Kuster. Will give clear liquid breakfast.   2. Permament atrial fibrillation - Rate controlled. Not on anticoagulation due to prior GI bleed.   3. SSS s/p Pacemaker - followed by EP  4. Acute presume diastolic CHF - BNP of 027. Net I & O positive. However has lost 9 lb since admit (167-->158lb). She only got IV lasix 20mg  x 1 today. Seems her dyspnea is mostly from pulmonary issue.    For questions or updates, please contact [place] Please consult [email or web address] for contact info under Cardiology/STEMI.   Mahalia Longest [place], [place]  07/18/2017 1:07 PM   I have seen and examined the patient along with Leanor Kail, PA .  I have reviewed the chart, notes and new data.  I agree with PA's note.  Key new complaints: She complains of feeling sick for roughly 3 weeks before she was hospitalized.  Her complaints consisted of fatigue, dyspnea may be some anorexia.  Eventually she sought medical help when she developed orthopnea.  It does not sound like she ever really had a cough.  She received oral antibiotics for 1 day before being hospitalized and has received intravenous antibiotics  after being admitted to the hospital.  Blood cultures were drawn after admission, before IV antibiotics were given. She has had a lot of problems with her teeth and had a dental abscess in the last several months, although she does not remember exactly when. Key examination changes: Irregular rhythm, split second heart sound, distinct holosystolic murmur at the apex that radiates deep into the axilla, 2-3/6, no accompanying Rumble.  The pacemaker pocket appears healthy Key new  findings / data: Negative blood cultures to date.  White blood cell count has been decreasing, but remains elevated.  I have reviewed the current echo as well as a last echo from 2016.  I think there is undoubtedly a new perivalvular mitral prosthetic leak that is at least moderate in severity and is accompanied by worsening pulmonary hypertension.  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  I do not think she will ever be a candidate for mitral valve surgery in view of her age and the fact that this would be a redo thoracotomy.  If she does have endocarditis, the best course of action might be a protracted course of intravenous antibiotics for at least 4-6 weeks, followed by lifelong suppressive therapy with oral antibiotics, assuming we can identify the organism.  The prompt response to antibiotic therapy suggest that it might be a streptococcal infection, possibly related to dental disease.  I have recommended that she undergo a transesophageal echocardiogram.  We will want to discuss this with the patient's family as well, since she bears a diagnosis of dementia.  She seemed quite well oriented and coherent and showed good judgment during our discussion today.    Consider ID consultation for choice of antibiotics.  Sanda Klein, MD, [place] [phone] 07/18/2017, 2:04 PM

## 2017-07-18 NOTE — Plan of Care (Signed)
  Health Behavior/Discharge Planning: Ability to manage health-related needs will improve 07/18/2017 2054 - Progressing by Daniil Labarge, Roma Kayser, RN   Clinical Measurements: Diagnostic test results will improve 07/18/2017 2054 - Progressing by Aika Brzoska A, RN   Clinical Measurements: Cardiovascular complication will be avoided 07/18/2017 2054 - Progressing by Axcel Horsch, Roma Kayser, RN

## 2017-07-19 ENCOUNTER — Encounter (HOSPITAL_COMMUNITY): Payer: Self-pay | Admitting: *Deleted

## 2017-07-19 ENCOUNTER — Encounter (HOSPITAL_COMMUNITY)
Admission: EM | Disposition: A | Discharge status: Home Health | Payer: Self-pay | Source: Home / Self Care | Attending: Family Medicine

## 2017-07-19 ENCOUNTER — Inpatient Hospital Stay (HOSPITAL_COMMUNITY): Payer: Medicare HMO

## 2017-07-19 DIAGNOSIS — I340 Nonrheumatic mitral (valve) insufficiency: Secondary | ICD-10-CM

## 2017-07-19 DIAGNOSIS — T82897A Other specified complication of cardiac prosthetic devices, implants and grafts, initial encounter: Secondary | ICD-10-CM

## 2017-07-19 DIAGNOSIS — R0989 Other specified symptoms and signs involving the circulatory and respiratory systems: Secondary | ICD-10-CM

## 2017-07-19 DIAGNOSIS — I330 Acute and subacute infective endocarditis: Secondary | ICD-10-CM

## 2017-07-19 HISTORY — PX: TEE WITHOUT CARDIOVERSION: SHX5443

## 2017-07-19 LAB — BASIC METABOLIC PANEL
Anion gap: 13 (ref 5–15)
BUN: 8 mg/dL (ref 6–20)
CO2: 21 mmol/L — ABNORMAL LOW (ref 22–32)
Calcium: 8.4 mg/dL — ABNORMAL LOW (ref 8.9–10.3)
Chloride: 105 mmol/L (ref 101–111)
Creatinine, Ser: 1.1 mg/dL — ABNORMAL HIGH (ref 0.44–1.00)
GFR calc Af Amer: 50 mL/min — ABNORMAL LOW (ref >60)
GFR calc non Af Amer: 43 mL/min — ABNORMAL LOW (ref >60)
GLUCOSE: 91 mg/dL (ref 65–99)
POTASSIUM: 3.9 mmol/L (ref 3.5–5.1)
Sodium: 139 mmol/L (ref 135–145)

## 2017-07-19 LAB — PROTIME-INR
INR: 1.2
Prothrombin Time: 15.1 seconds (ref 11.4–15.2)

## 2017-07-19 LAB — CBC
HCT: 29.5 % — ABNORMAL LOW (ref 36.0–46.0)
HEMOGLOBIN: 9.2 g/dL — AB (ref 12.0–15.0)
MCH: 27.8 pg (ref 26.0–34.0)
MCHC: 31.2 g/dL (ref 30.0–36.0)
MCV: 89.1 fL (ref 78.0–100.0)
PLATELETS: 396 10*3/uL (ref 150–400)
RBC: 3.31 MIL/uL — AB (ref 3.87–5.11)
RDW: 14.6 % (ref 11.5–15.5)
WBC: 11.8 10*3/uL — ABNORMAL HIGH (ref 4.0–10.5)

## 2017-07-19 SURGERY — ECHOCARDIOGRAM, TRANSESOPHAGEAL
Anesthesia: Moderate Sedation

## 2017-07-19 MED ORDER — SODIUM CHLORIDE 0.9 % IV SOLN: INTRAVENOUS | Status: DC

## 2017-07-19 MED ORDER — VANCOMYCIN HCL 10 G IV SOLR
1500.0000 mg | Freq: Once | INTRAVENOUS | Status: AC
  Administered 2017-07-19: 1500 mg via INTRAVENOUS
  Filled 2017-07-19: qty 1500

## 2017-07-19 MED ORDER — VANCOMYCIN HCL IN DEXTROSE 1-5 GM/200ML-% IV SOLN
1000.0000 mg | INTRAVENOUS | Status: DC
  Administered 2017-07-20 – 2017-07-25 (×6): 1000 mg via INTRAVENOUS
  Filled 2017-07-19 (×7): qty 200

## 2017-07-19 MED ORDER — FENTANYL CITRATE (PF) 100 MCG/2ML IJ SOLN
INTRAMUSCULAR | Status: DC | PRN
  Administered 2017-07-19 (×2): 25 ug via INTRAVENOUS

## 2017-07-19 MED ORDER — FENTANYL CITRATE (PF) 100 MCG/2ML IJ SOLN
INTRAMUSCULAR | Status: AC
  Filled 2017-07-19: qty 2

## 2017-07-19 MED ORDER — BUTAMBEN-TETRACAINE-BENZOCAINE 2-2-14 % EX AERO
INHALATION_SPRAY | CUTANEOUS | Status: DC | PRN
  Administered 2017-07-19: 1 via TOPICAL

## 2017-07-19 MED ORDER — SODIUM CHLORIDE 0.9 % IV SOLN
2.0000 g | INTRAVENOUS | Status: DC
  Administered 2017-07-19: 2 g via INTRAVENOUS
  Filled 2017-07-19 (×2): qty 20

## 2017-07-19 MED ORDER — MIDAZOLAM HCL 5 MG/ML IJ SOLN
INTRAMUSCULAR | Status: AC
  Filled 2017-07-19: qty 2

## 2017-07-19 MED ORDER — MIDAZOLAM HCL 10 MG/2ML IJ SOLN
INTRAMUSCULAR | Status: DC | PRN
  Administered 2017-07-19 (×4): 1 mg via INTRAVENOUS

## 2017-07-19 MED ORDER — RIFAMPIN 300 MG PO CAPS
300.0000 mg | ORAL_CAPSULE | Freq: Three times a day (TID) | ORAL | Status: DC
  Administered 2017-07-19 – 2017-07-26 (×22): 300 mg via ORAL
  Filled 2017-07-19 (×25): qty 1

## 2017-07-19 NOTE — Progress Notes (Signed)
  Echocardiogram Echocardiogram Transesophageal has been performed.  Johny Chess 07/19/2017, 4:42 PM

## 2017-07-19 NOTE — Op Note (Signed)
INDICATIONS: bioprosthetic valve infective endocarditis  PROCEDURE:   Informed consent was obtained prior to the procedure. The risks, benefits and alternatives for the procedure were discussed and the patient comprehended these risks.  Risks include, but are not limited to, cough, sore throat, vomiting, nausea, somnolence, esophageal and stomach trauma or perforation, bleeding, low blood pressure, aspiration, pneumonia, infection, trauma to the teeth and death.    After a procedural time-out, the oropharynx was anesthetized with 20% benzocaine spray.   During this procedure the patient was administered a total of Versed 4 mg and Fentanyl 50 mg to achieve and maintain moderate conscious sedation.  The patient's heart rate, blood pressure, and oxygen saturationweare monitored continuously during the procedure. The period of conscious sedation was 20 minutes, of which I was present face-to-face 100% of this time.  The transesophageal probe was inserted in the esophagus and stomach without difficulty and multiple views were obtained.  The patient was kept under observation until the patient left the procedure room.  The patient left the procedure room in stable condition.   Agitated microbubble saline contrast was not administered.  COMPLICATIONS:    There were no immediate complications.  FINDINGS:  Preserved/hyperdynamic left ventricular systolic function with left ventricular ejection fraction 65-70 %. Concentric left ventricular hypertrophy. Bioprosthetic mitral valve appears well seated, without rocking motion.  The mitral valve leaflets are supple, without visible vegetations.  There is a mild central jet of mitral insufficiency. There is a moderate size area of valve dehiscence and periprosthetic leak in the posterior annulus. There is moderate mitral regurgitation without evidence of flow reversal in the pulmonary veins. There is mild aortic valve sclerosis without stenosis and mild  central aortic insufficiency. The pulmonic valve is poorly imaged, but there is only mild pulmonic valve insufficiency. The tricuspid valve does not have any visible vegetations.  There is mild tricuspid insufficiency. Atrial and ventricular transvenous pacemaker leads are seen.  There is a small (6-7 mm) mobile structure, likely a vegetation, attached to the ventricular lead in its atrial portion. There is moderate pulmonary artery hypertension, estimated systolic PA pressure around 55 mmHg  RECOMMENDATIONS:    Findings are highly consistent with endocarditis with infection of the bioprosthetic mitral valve and moderate perivalvular mitral insufficiency as well as vegetation on the pacemaker lead.  Recommend ID consultation to tailor antibiotics for culture negative endocarditis.  Case discussed with Dr. Tommy Medal.  At this point, the hemodynamic consequences of the mitral regurgitation are only moderate and heart failure is reasonably well compensated.  The patient would be a very poor candidate for redo mitral valve surgery at age 84.  Pacemaker and lead extraction would serve little purpose if the prosthetic mitral valve cannot also be replaced.  Best option is probably long-term antibiotic suppressive therapy.  Prognosis is poor.  Time Spent Directly with the Patient:  45 minutes   Jasmine [name] 07/19/2017, 4:36 PM

## 2017-07-19 NOTE — Plan of Care (Signed)
  Clinical Measurements: Ability to maintain clinical measurements within normal limits will improve 07/19/2017 2035 - Progressing by Lilia Argue, RN   Clinical Measurements: Diagnostic test results will improve 07/19/2017 2035 - Progressing by Telesa Jeancharles, Roma Kayser, RN   Nutrition: Adequate nutrition will be maintained 07/19/2017 2035 - Progressing by Marlet Korte A, RN   Skin Integrity: Risk for impaired skin integrity will decrease 07/19/2017 2035 - Progressing by Francisco Ostrovsky, Roma Kayser, RN

## 2017-07-19 NOTE — Progress Notes (Addendum)
PROGRESS NOTE    Jasmine [name]  TKW:[id number] DOB: 01-04-1925 DOA: 07/15/2017 PCP: [name], MD   Chief Complaint  Patient presents with  . Weakness  . Cough    Brief Narrative:  HPI on 07/15/2017 by Dr. Gean Birchwood Jasmine [name] is a 84 y.o. female with history of sick sinus node status post pacemaker placement, chronic kidney disease stage III, chronic anemia, dementia was brought to the ER after patient had persistent shortness of breath.  Patient has been having persistent shortness of breath over the last 2 weeks with some cough and exertional symptoms.  Off and on and has had some chest pain.  Patient had gone to her PCP yesterday and was prescribed antibiotic for possible pneumonia.  In the office at the PCPs patient influenza was negative per the patient.  Despite which patient symptoms did not improve and came to the ER.  Interim history Admitted for Acute respiratory failure secondary to pneumonia and CHF.  Breathing appears to be improving.  Patient found to have leak on echocardiogram, cardiology consulted, suspecting possible endocarditis.  Pending decision on TEE. Assessment & Plan   Acute respiratory failure -Likely multifactorial including pneumonia and CHF -Continue to treat underlying conditions -No hypoxia noted.  Patient has had shortness of breath which is been persistent for the last 2 weeks -duonebs ordered PRN -Appears to be improving  Sepsis secondary to community-acquired pneumonia -Upon admission, patient was febrile with tachypnea and leukocytosis-improving -was placed azithromycin and ceftriaxone-will transition to oral antibiotics -Influenza PCR negative -Strep pneumonia urine antigen negative  -Legionella urine antigen pending -Repeat chest x-ray showed some interstitial lung disease, chronic.  Mild volume overload or edema -Suspect patient had either pneumonia or upper respiratory infection prior to admission as patient had shortness of  breath with cough approximately 2-3 weeks  Chronic diastolic CHF -Chest x-ray showed possible fluid overload/interstitial edema -Patient does not appear to be volume overloaded at this time -BNP 366 -Lasix currently held due to sepsis -Monitor intake and output, daily weights -Echocardiogram 06/24/2014 showed an EF of 50-55% mild hypokinesis of the anterior septal myocardium. -Echocardiogram showed EF of 60-65%, no regional wall motion abnormalities -Repeat chest x-ray noted above, will place patient on low-dose IV Lasix  Chronic atrial fibrillation -Currently appears to be paced on cardiac monitoring -Continue Cardizem -Does not appear to be on Coumadin due to GI bleed  Pulmonary hypertension -Echocardiogram showed systolic pressure is moderately increased.  PA peak pressure 60 mmHg  History of mitral valve replacement with bioprosthetic valve/ ?  Endocarditis -Echocardiogram showed bioprosthesis present.  Process appears to be well seated, without rocking motion.  At least moderate mitral insufficiency, most of which appears to be perivalvular at the posterior aspect of the prosthetic annulus.  Consider transesophageal echocardiogram to evaluate mitral perivalvular regurgitation. -Patient currently afebrile with improving leukocytosis. Blood cultures remain negative -Discussed further with cardiology, plan is to proceed with TEE to rule out endocarditis given echocardiogram findings. -Pending results of TEE, patient may need 6 weeks of antibiotics for endocarditis, and lifelong suppression antibiotics thereafter.  TEE negative would repeat blood cultures in 1-2 weeks. -Of note, patient does have pacemaker in place, if TEE positive, pacemaker leads may also be infected however patient likely not a good candidate for surgical removal. -If TEE positive, will consult infectious disease for antibiotic recommendations  History of hypertrophic obstructive cardiomyopathy -Status post  myomectomy -Last echocardiogram in 06/24/2014 showed hypokinesis of the mid and distal anterior septal myocardium consistent with  prior septal myomectomy for HOCM.  EF of 50-55%  Chronic anemia -Hemoglobin currently 9.2, no labs since 2016 -Continue iron supplementation -Continue to monitor CBC  Chronic kidney disease, stage III -Creatinine currently appears stable, 1.10, continue to monitor BMP  Depression -Continue Seroquel  Dementia -Stable, continue Namenda  DVT Prophylaxis Lovenox  Code Status: Full  Family Communication: None at bedside. Daughter via phone  Disposition Plan: Admitted.  Pending TEE and further recommendations from cardiology.  Consultants Cardiology   Procedures  Echocardiogram  Antibiotics   Anti-infectives (From admission, onward)   Start     Dose/Rate Route Frequency Ordered Stop   07/18/17 2000  cefTRIAXone (ROCEPHIN) 1 g in sodium chloride 0.9 % 100 mL IVPB     1 g 200 mL/hr over 30 Minutes Intravenous Every 24 hours 07/18/17 0901 07/23/17 1959   07/18/17 1000  azithromycin (ZITHROMAX) tablet 500 mg     500 mg Oral Daily 07/17/17 1328 07/24/17 0959   07/16/17 2000  cefTRIAXone (ROCEPHIN) 1 g in dextrose 5 % 50 mL IVPB  Status:  Discontinued     1 g 100 mL/hr over 30 Minutes Intravenous Every 24 hours 07/15/17 2204 07/18/17 0902   07/16/17 2000  azithromycin (ZITHROMAX) 500 mg in dextrose 5 % 250 mL IVPB  Status:  Discontinued     500 mg 250 mL/hr over 60 Minutes Intravenous Every 24 hours 07/15/17 2204 07/17/17 1328   07/15/17 1945  cefTRIAXone (ROCEPHIN) 1 g in dextrose 5 % 50 mL IVPB     1 g 100 mL/hr over 30 Minutes Intravenous  Once 07/15/17 1934 07/15/17 2100   07/15/17 1945  azithromycin (ZITHROMAX) 500 mg in dextrose 5 % 250 mL IVPB     500 mg 250 mL/hr over 60 Minutes Intravenous  Once 07/15/17 1934 07/15/17 2121      Subjective:   Jasmine [name] seen and examined today.  Patient with no complaints this morning.  States she  feels her breathing has improved.  She has occasional cough.  Currently denies chest pain, abdominal pain, nausea or vomiting, diarrhea or constipation.  Objective:   Vitals:   07/18/17 2029 07/18/17 2050 07/19/17 0529 07/19/17 0750  BP:  (!) 110/59 (!) 113/55   Pulse:  82 77   Resp:  20 18   Temp:  98.3 F (36.8 C) 98.4 F (36.9 C)   TempSrc:  Oral Oral   SpO2: 98% 97% 95% 96%  Weight:   71.4 kg (157 lb 8 oz)   Height:        Intake/Output Summary (Last 24 hours) at 07/19/2017 1031 Last data filed at 07/19/2017 0800 Gross per 24 hour  Intake 300 ml  Output 104 ml  Net 196 ml   Filed Weights   07/17/17 0450 07/18/17 0314 07/19/17 0529  Weight: 71.9 kg (158 lb 8 oz) 71.8 kg (158 lb 3.2 oz) 71.4 kg (157 lb 8 oz)   Exam  General: Well developed, elderly, no apparent distress  HEENT: NCAT, mucous membranes moist.  Poor dentition  Cardiovascular: S1 S2 auscultated, irregular rhythm  Respiratory: Clear to auscultation bilaterally  Abdomen: Soft, nontender, nondistended, + bowel sounds  Extremities: warm dry without cyanosis clubbing or edema  Neuro: AAOx3, nonfocal, h/o dementia  Psych: appropriate mood and affect, pleasant  Data Reviewed: I have personally reviewed following labs and imaging studies  CBC: Recent Labs  Lab 07/15/17 1826 07/16/17 0429 07/17/17 0620 07/18/17 0534 07/19/17 0453  WBC 22.3* 14.8* 13.0* 11.8* 11.8*  NEUTROABS  19.7*  --   --   --   --   HGB 9.5* 9.3* 9.5* 9.2* 9.2*  HCT 30.1* 29.5* 30.0* 28.9* 29.5*  MCV 88.8 88.3 88.0 87.6 89.1  PLT 359 329 369 387 401   Basic Metabolic Panel: Recent Labs  Lab 07/15/17 1826 07/15/17 2209 07/16/17 0429 07/17/17 0620 07/18/17 0534 07/18/17 1122 07/19/17 0453  NA 136  --  135 135 137  --  139  K 4.1  --  3.4* 3.3* 3.4*  --  3.9  CL 103  --  102 102 104  --  105  CO2 19*  --  21* 21* 22  --  21*  GLUCOSE 97  --  91 102* 101*  --  91  BUN 13  --  13 11 8   --  8  CREATININE 1.08*  --  1.13*  1.10* 1.03*  --  1.10*  CALCIUM 8.7*  --  8.5* 8.3* 8.3*  --  8.4*  MG  --  1.9  --   --   --  2.1  --    GFR: Estimated Creatinine Clearance: 34.4 mL/min (A) (by C-G formula based on SCr of 1.1 mg/dL (H)). Liver Function Tests: Recent Labs  Lab 07/15/17 1826  AST 25  ALT 18  ALKPHOS 74  BILITOT 0.9  PROT 7.3  ALBUMIN 2.9*   No results for input(s): LIPASE, AMYLASE in the last 168 hours. No results for input(s): AMMONIA in the last 168 hours. Coagulation Profile: Recent Labs  Lab 07/16/17 0429 07/17/17 0620 07/18/17 0534 07/19/17 0453  INR 1.38 1.38 1.30 1.20   Cardiac Enzymes: Recent Labs  Lab 07/15/17 1826 07/15/17 2209 07/16/17 0429 07/16/17 0943  TROPONINI 0.05* 0.03* 0.03* 0.03*   BNP (last 3 results) No results for input(s): PROBNP in the last 8760 hours. HbA1C: No results for input(s): HGBA1C in the last 72 hours. CBG: No results for input(s): GLUCAP in the last 168 hours. Lipid Profile: No results for input(s): CHOL, HDL, LDLCALC, TRIG, CHOLHDL, LDLDIRECT in the last 72 hours. Thyroid Function Tests: No results for input(s): TSH, T4TOTAL, FREET4, T3FREE, THYROIDAB in the last 72 hours. Anemia Panel: No results for input(s): VITAMINB12, FOLATE, FERRITIN, TIBC, IRON, RETICCTPCT in the last 72 hours. Urine analysis:    Component Value Date/Time   COLORURINE YELLOW 04/22/2015 [place] 04/22/2015 1705   LABSPEC 1.024 04/22/2015 1705   PHURINE 6.0 04/22/2015 [place] 04/22/2015 1705   HGBUR NEGATIVE 04/22/2015 1705   BILIRUBINUR NEGATIVE 04/22/2015 1705   KETONESUR NEGATIVE 04/22/2015 [place] 04/22/2015 1705   UROBILINOGEN 1.0 03/27/2015 2058   NITRITE NEGATIVE 04/22/2015 1705   LEUKOCYTESUR NEGATIVE 04/22/2015 1705   Sepsis Labs: @LABRCNTIP (procalcitonin:4,lacticidven:4)  ) Recent Results (from the past 240 hour(s))  Culture, blood (routine x 2)     Status: None (Preliminary result)    Collection Time: 07/15/17  6:30 PM  Result Value Ref Range Status   Specimen Description BLOOD LEFT HAND  Final   Special Requests   Final    BOTTLES DRAWN AEROBIC AND ANAEROBIC Blood Culture adequate volume   Culture   Final    NO GROWTH 4 DAYS Performed at [facility], [place] [address], [place], [place] [address]    Report Status PENDING  Incomplete  Culture, blood (routine x 2)     Status: None (Preliminary result)   Collection Time: 07/15/17  8:10 PM  Result Value Ref Range  Status   Specimen Description BLOOD BLOOD RIGHT HAND  Final   Special Requests IN PEDIATRIC BOTTLE Blood Culture adequate volume  Final   Culture   Final    NO GROWTH 4 DAYS Performed at [facility], [address] [place], [place] [address]    Report Status PENDING  Incomplete  Culture, sputum-assessment     Status: None   Collection Time: 07/16/17 11:21 AM  Result Value Ref Range Status   Specimen Description SPUTUM  Final   Special Requests NONE  Final   Sputum evaluation   Final    Sputum specimen not acceptable for testing.  Please recollect.   Gram Stain Report Called to,Read Back By and Verified With: L MCELWEE,RN AT 6837 07/16/17 BY L BENFIELD Performed at [facility], [place] [address] [place], [place] [address]    Report Status 07/16/2017 FINAL  Final      Radiology Studies: Dg Chest 2 View  Result Date: 07/18/2017 CLINICAL DATA:  Shortness of breath, atrial fibrillation, asthma EXAM: CHEST  2 VIEW COMPARISON:  07/15/2017 FINDINGS: Previous median sternotomy and left subclavian 2 lead pacer noted. Similar pattern of chronic interstitial reticulonodular opacities, worse in the right upper lobe and throughout the left lung. No superimposed collapse, consolidation, or pneumonia. No large effusion or pneumothorax. Trachea is midline. Atherosclerosis of the aorta. Degenerative changes of the spine with a mild scoliosis. IMPRESSION: Similar chronic interstitial lung disease pattern.  Difficult to exclude mild volume overload or edema. Electronically Signed   By: Jerilynn Mages.  Shick M.D.   On: 07/18/2017 07:49     Scheduled Meds: . azithromycin  500 mg Oral Daily  . brimonidine  1 drop Both Eyes Daily   And  . timolol  1 drop Both Eyes Daily  . budesonide  0.25 mg Nebulization BID  . diltiazem  120 mg Oral Daily  . dorzolamide-timolol  1 drop Both Eyes BID  . enoxaparin (LOVENOX) injection  40 mg Subcutaneous Q24H  . ferrous sulfate  325 mg Oral Q breakfast  . memantine  10 mg Oral BID  . metoprolol tartrate  50 mg Oral BID  . pantoprazole  40 mg Oral BID AC  . QUEtiapine  25 mg Oral QHS  . vitamin E  400 Units Oral Daily   Continuous Infusions: . sodium chloride    . cefTRIAXone (ROCEPHIN)  IV Stopped (07/18/17 2015)     LOS: 4 days   Time Spent in minutes   45 minutes  Mandy Fitzwater D.O. on 07/19/2017 at 10:31 AM  Between 7am to 7pm - Pager - [phone]  After 7pm go to [email or web address] - password [email or web address]  And look for the night coverage person covering for me after hours  [facility] Office  [phone]

## 2017-07-19 NOTE — Progress Notes (Signed)
Progress Note  Patient Name: Jasmine [name] Date of Encounter: 07/19/2017  Primary Cardiologist: No primary care provider on file.   Subjective   Feels well, denies dyspnea.  She appears to be fully alert and oriented, but does not remember our conversation about TEE from yesterday at all.  Spoke with her daughter, Mrs. [name], discussed the plans for transesophageal echo, discussed the procedures risks and benefits.  She agrees with the plan to go ahead with it.  Inpatient Medications    Scheduled Meds: . azithromycin  500 mg Oral Daily  . brimonidine  1 drop Both Eyes Daily   And  . timolol  1 drop Both Eyes Daily  . budesonide  0.25 mg Nebulization BID  . diltiazem  120 mg Oral Daily  . dorzolamide-timolol  1 drop Both Eyes BID  . enoxaparin (LOVENOX) injection  40 mg Subcutaneous Q24H  . ferrous sulfate  325 mg Oral Q breakfast  . memantine  10 mg Oral BID  . metoprolol tartrate  50 mg Oral BID  . pantoprazole  40 mg Oral BID AC  . QUEtiapine  25 mg Oral QHS  . vitamin E  400 Units Oral Daily   Continuous Infusions: . sodium chloride    . cefTRIAXone (ROCEPHIN)  IV Stopped (07/18/17 2015)   PRN Meds: acetaminophen **OR** acetaminophen, fluticasone, guaiFENesin, ipratropium-albuterol, nitroGLYCERIN, ondansetron **OR** ondansetron (ZOFRAN) IV   Vital Signs    Vitals:   07/18/17 2029 07/18/17 2050 07/19/17 0529 07/19/17 0750  BP:  (!) 110/59 (!) 113/55   Pulse:  82 77   Resp:  20 18   Temp:  98.3 F (36.8 C) 98.4 F (36.9 C)   TempSrc:  Oral Oral   SpO2: 98% 97% 95% 96%  Weight:   157 lb 8 oz (71.4 kg)   Height:        Intake/Output Summary (Last 24 hours) at 07/19/2017 0848 Last data filed at 07/19/2017 0800 Gross per 24 hour  Intake 300 ml  Output 504 ml  Net -204 ml   Filed Weights   07/17/17 0450 07/18/17 0314 07/19/17 0529  Weight: 158 lb 8 oz (71.9 kg) 158 lb 3.2 oz (71.8 kg) 157 lb 8 oz (71.4 kg)    Telemetry    Atrial fibrillation,  ventricular paced rhythm- Personally Reviewed  ECG    No new tracings- Personally Reviewed  Physical Exam  Appears comfortable GEN: No acute distress.   Neck: No JVD Cardiac: RRR, deviating deep into the axilla as well as towards the base, no diastolic murmurs, rubs, or gallops.  Respiratory: Clear to auscultation bilaterally. GI: Soft, nontender, non-distended  MS: No edema; No deformity. Neuro:  Nonfocal  Psych: Normal affect   Labs    Chemistry Recent Labs  Lab 07/15/17 1826  07/17/17 0620 07/18/17 0534 07/19/17 0453  NA 136   < > 135 137 139  K 4.1   < > 3.3* 3.4* 3.9  CL 103   < > 102 104 105  CO2 19*   < > 21* 22 21*  GLUCOSE 97   < > 102* 101* 91  BUN 13   < > 11 8 8   CREATININE 1.08*   < > 1.10* 1.03* 1.10*  CALCIUM 8.7*   < > 8.3* 8.3* 8.4*  PROT 7.3  --   --   --   --   ALBUMIN 2.9*  --   --   --   --   AST 25  --   --   --   --  ALT 18  --   --   --   --   ALKPHOS 74  --   --   --   --   BILITOT 0.9  --   --   --   --   GFRNONAA 44*   < > 43* 46* 43*  GFRAA 51*   < > 50* 54* 50*  ANIONGAP 14   < > 12 11 13    < > = values in this interval not displayed.     Hematology Recent Labs  Lab 07/17/17 0620 07/18/17 0534 07/19/17 0453  WBC 13.0* 11.8* 11.8*  RBC 3.41* 3.30* 3.31*  HGB 9.5* 9.2* 9.2*  HCT 30.0* 28.9* 29.5*  MCV 88.0 87.6 89.1  MCH 27.9 27.9 27.8  MCHC 31.7 31.8 31.2  RDW 14.4 14.4 14.6  PLT 369 387 396    Cardiac Enzymes Recent Labs  Lab 07/15/17 1826 07/15/17 2209 07/16/17 0429 07/16/17 0943  TROPONINI 0.05* 0.03* 0.03* 0.03*   No results for input(s): TROPIPOC in the last 168 hours.   BNP Recent Labs  Lab 07/15/17 1826  BNP 366.4*     DDimer No results for input(s): DDIMER in the last 168 hours.   Radiology    Dg Chest 2 View  Result Date: 07/18/2017 CLINICAL DATA:  Shortness of breath, atrial fibrillation, asthma EXAM: CHEST  2 VIEW COMPARISON:  07/15/2017 FINDINGS: Previous median sternotomy and left  subclavian 2 lead pacer noted. Similar pattern of chronic interstitial reticulonodular opacities, worse in the right upper lobe and throughout the left lung. No superimposed collapse, consolidation, or pneumonia. No large effusion or pneumothorax. Trachea is midline. Atherosclerosis of the aorta. Degenerative changes of the spine with a mild scoliosis. IMPRESSION: Similar chronic interstitial lung disease pattern. Difficult to exclude mild volume overload or edema. Electronically Signed   By: Jerilynn Mages.  Shick M.D.   On: 07/18/2017 07:49    Cardiac Studies   Echo 07/17/17 Study Conclusions  - Left ventricle: The cavity size was normal. There was moderate concentric hypertrophy. Systolic function was normal. The estimated ejection fraction was in the range of 60% to 65%. Wall motion was normal; there were no regional wall motion abnormalities. - Aortic valve: There was mild to moderate regurgitation directed centrally in the LVOT. - Mitral valve: A bioprosthesis was present. The prosthesis appears well seated, without rocking motion. There is at least moderate mitral insufficiency, most of which appears to be perivalvular, at the posterior aspect of the prosthetic annulus. - Left atrium: The atrium was moderately to severely dilated. - Right atrium: The atrium was mildly dilated. - Tricuspid valve: There was moderate regurgitation directed centrally. - Pulmonary arteries: Systolic pressure was moderately increased. PA peak pressure: 60 mm Hg (S).  Recommendations:  1. Consider transesophageal echocardiography to evaluate mitral perivalvular regurgitation. If the patient has fever or leukocytosis, blood cultures are appropriate. 2. Due to technical problems, the previous images could not be reviewed, but periannular mitral insufficiency was not previously reported.   Left Heart Cath and Coronary Angiography  03/2015  Conclusion   1. Angiographically  normal CAD with very tortuous vessels. 2. The left ventricular systolic function is normal. 3. Normal LVEDP    Patient Profile     84 y.o. female with history of mitral regurgitation and hypertrophic cardiomyopathy, status post septal myomectomy and mitral valve replacement with 27 mm bioprosthesis in 2007, normal coronary arteries by angiography 2016, permanent atrial fibrillation with slow ventricular response status post pacemaker implantation ([employer]  DR 2013, not pacemaker dependent) presents with acute heart failure exacerbation in the setting of protracted febrile illness, quickly responsive to antibiotic therapy, with echo showing evidence of perivalvular mitral insufficiency, raising suspicion for bioprosthetic endocarditis.  Not receiving anticoagulation due to previous GI bleeding.  04/11/2006  1. Left ventricular septal myomectomy for idiopathic hypertrophic      subaortic stenosis.  2. Mitral valve replacement with a 27-mm Biocor pericardial valve, model      number B10-42M, serial number [id number]. Assessment & Plan    1. Possible endocarditis: Recommend TEE.  Discussed this with the patient and her daughter who both agree to proceed.  If the study confirms suspicion for endocarditis, would recommend ID consultation and protracted course of intravenous antibiotics.  Unfortunately she was already receiving oral antibiotics on admission and her blood cultures may be sterile.  She might require lifelong oral antibiotic suppression even after completing the IV antibiotics.  If the TEE does not confirm suspicion for endocarditis, would consider performing blood cultures 1-2 weeks following completion of the current antibiotic course.  If she has streptococcal infection, we have a much better chance of suppressing this rather than staphylococcal. 2.  Prosthetic mitral valve regurgitation: This is at least moderate, and considering the fact that it led to congestive heart failure and  significant pulmonary hypertension, could be severe.  We all agreed that she is by no means a candidate for redo heart surgery.  There might be an option for device closure of a periprosthetic leak, but this would have to be performed in a referral center.  Meanwhile managed medically with diuretics and vasodilators. 3.  Permanent atrial fibrillation: Not on antibiotics due to history of serious GI bleeding 4.  Acute chronic diastolic heart failure: Think this is primarily due to new mitral insufficiency, aon background of hypertrophic cardiomyopathy with diastolic dysfunction.  10 pounds down since hospital admission.  Creatinine remains unchanged.  As far as I can tell clinically euvolemic. 5.  Permanent pacemaker: Dual-chamber device programmed VVIR for atrial fibrillation.  If she does have prosthetic endocarditis it is very likely that the pacemaker leads are also contaminated.  As discussed above, I doubt she would ever be a good candidate for complete removal of foreign material such as the pacemaker and the prostatic leads.  Best bet is lifelong antibiotic suppression.  For questions or updates, please contact [place] Please consult [email or web address] for contact info under Cardiology/STEMI.      Signed, Sanda Klein, MD  07/19/2017, 8:48 AM

## 2017-07-19 NOTE — Care Management Important Message (Signed)
Important Message  Patient Details  Name: Jasmine [name] MRN: [id number] Date of Birth: 08-Feb-1925   Medicare Important Message Given:  Yes    Hamish Banks [address] 07/19/2017, 11:21 AM

## 2017-07-19 NOTE — Progress Notes (Signed)
Pharmacy Antibiotic Note  Jasmine [name] is a 84 y.o. female admitted on 07/15/2017 with shortness of breath and started on azithromycin and ceftriaxone for CAP.  Pharmacy has been consulted for vancomycin dosing for possible endocarditis.  Plan: - vancomycin load of 1500 mg x 1 - vancomycin 1000 mg every 24 hours - goal trough 15- 20 mcg/mL - monitor clinical progression, renal function, and trough as needed  Height: 5\' 6"  (167.6 cm) Weight: 157 lb 8 oz (71.4 kg)(b scale) IBW/kg (Calculated) : 59.3  Temp (24hrs), Avg:98.1 F (36.7 C), Min:97.8 F (36.6 C), Max:98.4 F (36.9 C)  Recent Labs  Lab 07/15/17 1826 07/15/17 1846 07/15/17 2017 07/16/17 0429 07/17/17 0620 07/18/17 0534 07/19/17 0453  WBC 22.3*  --   --  14.8* 13.0* 11.8* 11.8*  CREATININE 1.08*  --   --  1.13* 1.10* 1.03* 1.10*  LATICACIDVEN  --  1.33 0.95  --   --   --   --     Estimated Creatinine Clearance: 34.4 mL/min (A) (by C-G formula based on SCr of 1.1 mg/dL (H)).    Allergies  Allergen Reactions  . Cymbalta [Duloxetine Hcl] Diarrhea and Other (See Comments)    weakness  . Dexilant [Dexlansoprazole] Diarrhea and Other (See Comments)    Bloating also    Antimicrobials this admission: Azithromycin 2/11 >> 2/12 Ceftriaxone 2/11 >>  Rifampin 2/12 >> Vancomycin 2/12 >>  Dose adjustments this admission: N/A  Microbiology results: 2/8 BCx: NGTD  Thank you for allowing pharmacy to be a part of this patient's care.  Deboraha Sprang, PharmD 07/19/2017 4:46 PM

## 2017-07-20 ENCOUNTER — Encounter (HOSPITAL_COMMUNITY): Payer: Self-pay | Admitting: Cardiovascular Disease

## 2017-07-20 DIAGNOSIS — Z8249 Family history of ischemic heart disease and other diseases of the circulatory system: Secondary | ICD-10-CM

## 2017-07-20 DIAGNOSIS — Z8679 Personal history of other diseases of the circulatory system: Secondary | ICD-10-CM

## 2017-07-20 DIAGNOSIS — I330 Acute and subacute infective endocarditis: Secondary | ICD-10-CM

## 2017-07-20 DIAGNOSIS — T827XXA Infection and inflammatory reaction due to other cardiac and vascular devices, implants and grafts, initial encounter: Secondary | ICD-10-CM

## 2017-07-20 DIAGNOSIS — Z823 Family history of stroke: Secondary | ICD-10-CM

## 2017-07-20 DIAGNOSIS — I38 Endocarditis, valve unspecified: Secondary | ICD-10-CM

## 2017-07-20 DIAGNOSIS — K0889 Other specified disorders of teeth and supporting structures: Secondary | ICD-10-CM

## 2017-07-20 DIAGNOSIS — B351 Tinea unguium: Secondary | ICD-10-CM

## 2017-07-20 DIAGNOSIS — R0602 Shortness of breath: Secondary | ICD-10-CM

## 2017-07-20 DIAGNOSIS — T826XXA Infection and inflammatory reaction due to cardiac valve prosthesis, initial encounter: Secondary | ICD-10-CM

## 2017-07-20 DIAGNOSIS — N183 Chronic kidney disease, stage 3 (moderate): Secondary | ICD-10-CM

## 2017-07-20 DIAGNOSIS — J9601 Acute respiratory failure with hypoxia: Secondary | ICD-10-CM

## 2017-07-20 DIAGNOSIS — R011 Cardiac murmur, unspecified: Secondary | ICD-10-CM

## 2017-07-20 DIAGNOSIS — Z952 Presence of prosthetic heart valve: Secondary | ICD-10-CM

## 2017-07-20 DIAGNOSIS — I340 Nonrheumatic mitral (valve) insufficiency: Secondary | ICD-10-CM

## 2017-07-20 DIAGNOSIS — L608 Other nail disorders: Secondary | ICD-10-CM

## 2017-07-20 DIAGNOSIS — K089 Disorder of teeth and supporting structures, unspecified: Secondary | ICD-10-CM

## 2017-07-20 DIAGNOSIS — Z950 Presence of cardiac pacemaker: Secondary | ICD-10-CM

## 2017-07-20 DIAGNOSIS — Z888 Allergy status to other drugs, medicaments and biological substances status: Secondary | ICD-10-CM

## 2017-07-20 DIAGNOSIS — I509 Heart failure, unspecified: Secondary | ICD-10-CM

## 2017-07-20 DIAGNOSIS — Z87891 Personal history of nicotine dependence: Secondary | ICD-10-CM

## 2017-07-20 LAB — CBC
HEMATOCRIT: 28.5 % — AB (ref 36.0–46.0)
HEMOGLOBIN: 8.8 g/dL — AB (ref 12.0–15.0)
MCH: 27.7 pg (ref 26.0–34.0)
MCHC: 30.9 g/dL (ref 30.0–36.0)
MCV: 89.6 fL (ref 78.0–100.0)
Platelets: 399 10*3/uL (ref 150–400)
RBC: 3.18 MIL/uL — ABNORMAL LOW (ref 3.87–5.11)
RDW: 14.6 % (ref 11.5–15.5)
WBC: 12.5 10*3/uL — AB (ref 4.0–10.5)

## 2017-07-20 LAB — BASIC METABOLIC PANEL
ANION GAP: 11 (ref 5–15)
BUN: 8 mg/dL (ref 6–20)
CALCIUM: 7.9 mg/dL — AB (ref 8.9–10.3)
CHLORIDE: 106 mmol/L (ref 101–111)
CO2: 21 mmol/L — AB (ref 22–32)
Creatinine, Ser: 1.01 mg/dL — ABNORMAL HIGH (ref 0.44–1.00)
GFR calc non Af Amer: 48 mL/min — ABNORMAL LOW (ref >60)
GFR, EST AFRICAN AMERICAN: 55 mL/min — AB (ref >60)
Glucose, Bld: 85 mg/dL (ref 65–99)
Potassium: 3.6 mmol/L (ref 3.5–5.1)
Sodium: 138 mmol/L (ref 135–145)

## 2017-07-20 LAB — SEDIMENTATION RATE: SED RATE: 100 mm/h — AB (ref 0–22)

## 2017-07-20 LAB — CULTURE, BLOOD (ROUTINE X 2)
CULTURE: NO GROWTH
Culture: NO GROWTH
SPECIAL REQUESTS: ADEQUATE
SPECIAL REQUESTS: ADEQUATE

## 2017-07-20 LAB — C-REACTIVE PROTEIN: CRP: 5.3 mg/dL — AB (ref <1.0)

## 2017-07-20 LAB — HIV ANTIBODY (ROUTINE TESTING W REFLEX): HIV Screen 4th Generation wRfx: NONREACTIVE

## 2017-07-20 LAB — PROTIME-INR
INR: 1.25
Prothrombin Time: 15.6 seconds — ABNORMAL HIGH (ref 11.4–15.2)

## 2017-07-20 MED ORDER — SODIUM CHLORIDE 0.9 % IV SOLN
2.0000 g | INTRAVENOUS | Status: DC
  Administered 2017-07-20 – 2017-07-26 (×7): 2 g via INTRAVENOUS
  Filled 2017-07-20 (×7): qty 20

## 2017-07-20 NOTE — Progress Notes (Signed)
[facility] will provide Home Infusion Pharm[facility] services for pt at DC for home IV ABX.  [facility] will partner with pts [place] of choice.  If patient discharges after hours, please call [phone].   Larry Sierras 07/20/2017, 3:05 PM

## 2017-07-20 NOTE — Consult Note (Signed)
Date of Admission:  07/15/2017          Reason for Consult: Culture negative endocarditis     Referring Provider: Dr. Cristal Ford   Assessment: Jasmine [name] is a 84 y.o. female with a prosthetic mitral valve replacement, sick sinus syndrome s/p pacemaker placement, and CKD Stage III who presented to the ED on 2/8 with productive cough, SOB, and fevers of 4 days duration. She was admitted for acute hypoxic respiratory failure felt to be secondary to PNA and acute heart failure exacerbation. Cardiology was consulted on 2/11 and took patient for TEE on 2/12 that illustrated moderate perivalvular mitral insufficiency as well as vegetation on the pacemaker lead.  Per cardiology, "The patient would be a very poor candidate for redo mitral valve surgery at age 14.  Pacemaker and lead extraction would serve little purpose if the prosthetic mitral valve cannot also be replaced.  Best option is probably long-term antibiotic suppressive therapy."  Likely that blood cultures are skewed due to the patient receiving outpatient antibiotics prior to admission. Called the patient's pharmacy and prior to admission she was taking Levofloxacin. Also called her PCP's office to see if blood cultures were drawn and there were none drawn to aid in antibiotic management. She also has poor dental hygiene which definitely could be a source of her endocarditis. Would tailor antibiotic regimen to cover staph, strep, and gram negative bacilli. Vancomycin and Ceftriaxone should cover these organisms adequately. Rifampin added since pacemaker lead is involved.    Plan: 1. Continue Vancomycin, Rifampin, and Ceftriaxone for 6 weeks  2. Dental consult   Principal Problem:   Acute respiratory failure with hypoxia (HCC) Active Problems:   Chronic atrial fibrillation (HCC)   History of mitral valve replacement with bioprosthetic valve   Pulmonary HTN (HCC)   IHSS (idiopathic hypertrophic subaortic stenosis) (HCC)  Chronic kidney disease (CKD), stage III (moderate) (HCC)   Non-obstructive coronary artery disease   Essential hypertension   Community acquired pneumonia   Acute infective endocarditis  Scheduled Meds: . brimonidine  1 drop Both Eyes Daily   And  . timolol  1 drop Both Eyes Daily  . budesonide  0.25 mg Nebulization BID  . diltiazem  120 mg Oral Daily  . dorzolamide-timolol  1 drop Both Eyes BID  . enoxaparin (LOVENOX) injection  40 mg Subcutaneous Q24H  . ferrous sulfate  325 mg Oral Q breakfast  . memantine  10 mg Oral BID  . metoprolol tartrate  50 mg Oral BID  . pantoprazole  40 mg Oral BID AC  . QUEtiapine  25 mg Oral QHS  . rifampin  300 mg Oral Q8H  . vitamin E  400 Units Oral Daily   Continuous Infusions: . cefTRIAXone (ROCEPHIN)  IV Stopped (07/19/17 2330)  . vancomycin     PRN Meds:.acetaminophen **OR** acetaminophen, fluticasone, guaiFENesin, ipratropium-albuterol, nitroGLYCERIN, ondansetron **OR** ondansetron (ZOFRAN) IV  HPI: Jasmine [name] is a 84 y.o. female with a prosthetic mitral valve replacement, sick sinus syndrome s/p pacemaker placement, and CKD Stage III who presented to the ED on 2/8 with productive cough, SOB, and fevers of 4 days duration. She was seen by her PCP prior to admission and given Abx for PNA. She took 2 doses prior to admission. In the ED she was found to be febrile with a leukocytosis. Admitted for acute hypoxic respiratory failure felt to be secondary to PNA and acute heart failure exacerbation. Cardiology was consulted on 2/11 and took  patient for TEE on 2/12 that illustrated moderate perivalvular mitral insufficiency as well as vegetation on the pacemaker lead.  Abx: Ceftriaxone 2/8 -> Current  Azithromycin 2/8 -> 2/8 Vancomycin 2/12 -> Current  Rifampin 2/12 -> Current   Cultures/viral testing:  2/08 Respiratory viral panel negative  2/08 Influenza negative  2/08 Blood cultures with no growth to date  2/09 Sputum cultures with no  growth to date   Review of Systems: Review of Systems  Constitutional: Positive for fever.  Respiratory: Positive for cough, sputum production and shortness of breath.   All other systems reviewed and are negative.  Past Medical History:  Diagnosis Date  . Aortic insufficiency    a. 06/2014 Echo: Mild AI.  Marland Kitchen Asthma   . Atrial fibrillation and flutter    a. atrial fibrillation s/p DCCV 06-2012;  b. now in chronic atrial fibrillation off anticoagulation due to GI bleed.  . Bradycardia    a. 10/2011 s/p SJM [facility] DR DC PPM, ser #: [id number].  Marland Kitchen Chronic kidney disease (CKD), stage III (moderate) (HCC)   . Dementia   . Diverticulosis of colon with hemorrhage 08/12/2012  . Essential hypertension   . GERD (gastroesophageal reflux disease)   . Glaucoma   . History of GI Bleed    a. prevents use of Renovo.  Marland Kitchen History of Severe Mitral Regurgitation    a. s/p MVR with pericardial tissue valve;  b. 06/2014 Mild Mitral Stenosis by echo.  Marland Kitchen HOCM (hypertrophic obstructive cardiomyopathy) ([place])    a. s/p septal myomectomy;  b. 06/2014 Echo: EF 50-55%, mild antsept HK, mild MR, mild MS/MR (bioprosth valve), mildly dil LA, mod TR, PASP 55mmHg.  Marland Kitchen Hyperlipidemia   . Insomnia   . Non-obstructive coronary artery disease    a. 06/2014 NSTEMI/Cath: LM nl, LAD nl, LCX nl, RCA 10-56m, EF 45%.  . Pulmonary HTN ([place])    a. mild to moderate (PASP 65mmHg by echo 06/2014).  . Reflux esophagitis    Social History   Tobacco Use  . Smoking status: Former Smoker    Types: Cigarettes    Last attempt to quit: 06/07/1974    Years since quitting: 43.1  . Smokeless tobacco: Former Systems developer    Types: Snuff    Quit date: 04/23/1975  . Tobacco comment: 10/05/11 "  Substance Use Topics  . Alcohol use: No    Alcohol/week: 0.0 oz    Comment: 10/05/11 "used to drink; quit a long time ago"  . Drug use: No   Family History  Problem Relation Age of Onset  . CVA Mother   . Hypertension Mother   . CVA Brother   . Hypertension  Brother    Allergies  Allergen Reactions  . Cymbalta [Duloxetine Hcl] Diarrhea and Other (See Comments)    weakness  . Dexilant [Dexlansoprazole] Diarrhea and Other (See Comments)    Bloating also   OBJECTIVE: Blood pressure (!) 126/51, pulse 69, temperature 98.5 F (36.9 C), temperature source Oral, resp. rate 18, height 5\' 6"  (1.676 m), weight 159 lb 1.6 oz (72.2 kg), SpO2 98 %.  Physical Exam  Constitutional: She is oriented to person, place, and time and well-developed, well-nourished, and in no distress.  HENT:  Head: Normocephalic and atraumatic.  Eyes: Conjunctivae are normal. Pupils are equal, round, and reactive to light.  Neck: Normal range of motion. Neck supple.  Cardiovascular: Normal rate and regular rhythm.  Murmur heard. Pulmonary/Chest: Effort normal and breath sounds normal. She has no wheezes. She has  no rales.  Abdominal: Soft. Bowel sounds are normal. There is no tenderness. There is no rebound.  Musculoskeletal: Normal range of motion. She exhibits no edema.  Neurological: She is alert and oriented to person, place, and time.   Lab Results Lab Results  Component Value Date   WBC 12.5 (H) 07/20/2017   HGB 8.8 (L) 07/20/2017   HCT 28.5 (L) 07/20/2017   MCV 89.6 07/20/2017   PLT 399 07/20/2017    Lab Results  Component Value Date   CREATININE 1.01 (H) 07/20/2017   BUN 8 07/20/2017   NA 138 07/20/2017   K 3.6 07/20/2017   CL 106 07/20/2017   CO2 21 (L) 07/20/2017    Lab Results  Component Value Date   ALT 18 07/15/2017   AST 25 07/15/2017   ALKPHOS 74 07/15/2017   BILITOT 0.9 07/15/2017    Microbiology: Recent Results (from the past 240 hour(s))  Culture, blood (routine x 2)     Status: None (Preliminary result)   Collection Time: 07/15/17  6:30 PM  Result Value Ref Range Status   Specimen Description BLOOD LEFT HAND  Final   Special Requests   Final    BOTTLES DRAWN AEROBIC AND ANAEROBIC Blood Culture adequate volume   Culture   Final     NO GROWTH 4 DAYS Performed at [facility], [place] [address], [place], [place] [address]    Report Status PENDING  Incomplete  Culture, blood (routine x 2)     Status: None (Preliminary result)   Collection Time: 07/15/17  8:10 PM  Result Value Ref Range Status   Specimen Description BLOOD BLOOD RIGHT HAND  Final   Special Requests IN PEDIATRIC BOTTLE Blood Culture adequate volume  Final   Culture   Final    NO GROWTH 4 DAYS Performed at [facility], [place] [address], [place], [place] [address]    Report Status PENDING  Incomplete  Culture, sputum-assessment     Status: None   Collection Time: 07/16/17 11:21 AM  Result Value Ref Range Status   Specimen Description SPUTUM  Final   Special Requests NONE  Final   Sputum evaluation   Final    Sputum specimen not acceptable for testing.  Please recollect.   Gram Stain Report Called to,Read Back By and Verified With: L MCELWEE,RN AT 3762 07/16/17 BY L BENFIELD Performed at [facility], [place] [address] [place], [place] [address]    Report Status 07/16/2017 FINAL  Final   Ina Homes, MD [facility] [phone] pager  07/20/2017, 7:53 AM

## 2017-07-20 NOTE — Progress Notes (Signed)
PHARMACY CONSULT NOTE FOR:  OUTPATIENT  PARENTERAL ANTIBIOTIC THERAPY (OPAT)  Indication: endocarditis Regimen: Vancomycin/rocephin and po rifampin  End date: 08/29/2017  IV antibiotic discharge orders are pended. To discharging provider:  please sign these orders via discharge navigator,  Select New Orders & click on the button choice - Manage This Unsigned Work.     Thank you for allowing pharmacy to be a part of this patient's care.  Manley Mason 07/20/2017, 3:20 PM

## 2017-07-20 NOTE — Progress Notes (Addendum)
Progress Note  Patient Name: Jasmine [name] Date of Encounter: 07/20/2017  Primary Cardiologist: No primary care provider on file.   Subjective   No dyspnea. Mildly sore throat.  Inpatient Medications    Scheduled Meds: . brimonidine  1 drop Both Eyes Daily   And  . timolol  1 drop Both Eyes Daily  . budesonide  0.25 mg Nebulization BID  . diltiazem  120 mg Oral Daily  . dorzolamide-timolol  1 drop Both Eyes BID  . enoxaparin (LOVENOX) injection  40 mg Subcutaneous Q24H  . ferrous sulfate  325 mg Oral Q breakfast  . memantine  10 mg Oral BID  . metoprolol tartrate  50 mg Oral BID  . pantoprazole  40 mg Oral BID AC  . QUEtiapine  25 mg Oral QHS  . rifampin  300 mg Oral Q8H  . vitamin E  400 Units Oral Daily   Continuous Infusions: . cefTRIAXone (ROCEPHIN)  IV Stopped (07/19/17 2330)  . vancomycin     PRN Meds: acetaminophen **OR** acetaminophen, fluticasone, guaiFENesin, ipratropium-albuterol, nitroGLYCERIN, ondansetron **OR** ondansetron (ZOFRAN) IV   Vital Signs    Vitals:   07/19/17 1630 07/19/17 2030 07/19/17 2119 07/20/17 0557  BP: 139/66  (!) 132/56 (!) 126/51  Pulse: 70  82 69  Resp: (!) 27   18  Temp:   97.6 F (36.4 C) 98.5 F (36.9 C)  TempSrc:   Oral Oral  SpO2: 95% 97% 100% 98%  Weight:    159 lb 1.6 oz (72.2 kg)  Height:        Intake/Output Summary (Last 24 hours) at 07/20/2017 0835 Last data filed at 07/20/2017 0552 Gross per 24 hour  Intake 420 ml  Output 750 ml  Net -330 ml   Filed Weights   07/18/17 0314 07/19/17 0529 07/20/17 0557  Weight: 158 lb 3.2 oz (71.8 kg) 157 lb 8 oz (71.4 kg) 159 lb 1.6 oz (72.2 kg)    Telemetry    AFib, intermittent V pacing - Personally Reviewed  ECG    No new tracing - Personally Reviewed  Physical Exam  Comfortable, eating breakfast GEN: No acute distress.   Neck: No JVD Cardiac: irregular, 3/6 apical holosystolic murmur, no diastolic murmurs, rubs, or gallops.  Respiratory: Clear to  auscultation bilaterally. GI: Soft, nontender, non-distended  MS: No edema; No deformity. Neuro:  Nonfocal  Psych: Normal affect   Labs    Chemistry Recent Labs  Lab 07/15/17 1826  07/18/17 0534 07/19/17 0453 07/20/17 0523  NA 136   < > 137 139 138  K 4.1   < > 3.4* 3.9 3.6  CL 103   < > 104 105 106  CO2 19*   < > 22 21* 21*  GLUCOSE 97   < > 101* 91 85  BUN 13   < > 8 8 8   CREATININE 1.08*   < > 1.03* 1.10* 1.01*  CALCIUM 8.7*   < > 8.3* 8.4* 7.9*  PROT 7.3  --   --   --   --   ALBUMIN 2.9*  --   --   --   --   AST 25  --   --   --   --   ALT 18  --   --   --   --   ALKPHOS 74  --   --   --   --   BILITOT 0.9  --   --   --   --  GFRNONAA 44*   < > 46* 43* 48*  GFRAA 51*   < > 54* 50* 55*  ANIONGAP 14   < > 11 13 11    < > = values in this interval not displayed.     Hematology Recent Labs  Lab 07/18/17 0534 07/19/17 0453 07/20/17 0523  WBC 11.8* 11.8* 12.5*  RBC 3.30* 3.31* 3.18*  HGB 9.2* 9.2* 8.8*  HCT 28.9* 29.5* 28.5*  MCV 87.6 89.1 89.6  MCH 27.9 27.8 27.7  MCHC 31.8 31.2 30.9  RDW 14.4 14.6 14.6  PLT 387 396 399    Cardiac Enzymes Recent Labs  Lab 07/15/17 1826 07/15/17 2209 07/16/17 0429 07/16/17 0943  TROPONINI 0.05* 0.03* 0.03* 0.03*   No results for input(s): TROPIPOC in the last 168 hours.   BNP Recent Labs  Lab 07/15/17 1826  BNP 366.4*     DDimer No results for input(s): DDIMER in the last 168 hours.   Radiology    No results found.  Cardiac Studies   TEE 07/19/2017  - Left ventricle: The cavity size was normal. There was moderate   concentric hypertrophy. Systolic function was normal. The   estimated ejection fraction was in the range of 60% to 65%. - Aortic valve: No evidence of vegetation. There was mild   regurgitation. - Mitral valve: A bioprosthesis was present. The sewing ring was   partially dehisced, but the valve remains well seated, without   rocking. There was a 3-4 mm gap between the mitral annulus and    the prosthetic ring, over a roughly 8-10 mm long arc at the   posterior aspect of the ring. No vegetations are seen on the   prosthetic leaflets. Wispy echogenic structures along the atrial   surface of the sewing ring likely represent sewing material, not   vegetations. There was mild regurgitation directed centrally.   There was moderate perivalvular regurgitation. - Left atrium: The atrium was moderately to severely dilated. No   evidence of thrombus in the atrial cavity or appendage. There was   mildcontinuous spontaneous echo contrast (&quot;smoke&quot;) in the   appendage. - Right atrium: A 6-7 mm mobile mass was attached to the atrial   portion of the ventricular pacing lead. This is consistent with a   vegetation. The atrium was dilated. - Atrial septum: No defect or patent foramen ovale was identified. - Tricuspid valve: No evidence of vegetation. - Pulmonic valve: No evidence of vegetation. - Line: Two venous pacing wires were visualized in the right atrial   cavity and right ventricular cavity, attached to the right atrial   appendage and RV apex, respectively.  Impressions:  - Findings are consistent with infective endocarditis, with   moderate periprosthetic mitral insufficiency and a vegetation on   the ventricular pacemaker leads.  Echo 07/17/17 Study Conclusions  - Left ventricle: The cavity size was normal. There was moderate concentric hypertrophy. Systolic function was normal. The estimated ejection fraction was in the range of 60% to 65%. Wall motion was normal; there were no regional wall motion abnormalities. - Aortic valve: There was mild to moderate regurgitation directed centrally in the LVOT. - Mitral valve: A bioprosthesis was present. The prosthesis appears well seated, without rocking motion. There is at least moderate mitral insufficiency, most of which appears to be perivalvular, at the posterior aspect of the prosthetic  annulus. - Left atrium: The atrium was moderately to severely dilated. - Right atrium: The atrium was mildly dilated. - Tricuspid valve: There was  moderate regurgitation directed centrally. - Pulmonary arteries: Systolic pressure was moderately increased. PA peak pressure: 60 mm Hg (S).  Recommendations:  1. Consider transesophageal echocardiography to evaluate mitral perivalvular regurgitation. If the patient has fever or leukocytosis, blood cultures are appropriate. 2. Due to technical problems, the previous images could not be reviewed, but periannular mitral insufficiency was not previously reported.   Left Heart Cath and Coronary Angiography10/2016  Conclusion   1. Angiographically normal CAD with very tortuous vessels. 2. The left ventricular systolic function is normal. 3. Normal LVEDP       Patient Profile     84 y.o. female with history of mitral regurgitation and hypertrophic cardiomyopathy, status post septal myomectomy and mitral valve replacement with 27 mm bioprosthesis in 2007, normal coronary arteries by angiography 2016, permanent atrial fibrillation with slow ventricular response status post pacemaker implantation ([place] DR 2013, not pacemaker dependent) presents with acute heart failure exacerbation in the setting of protracted febrile illness, quickly responsive to antibiotic therapy, with echo showing evidence of perivalvular mitral insufficiency, raising suspicion for bioprosthetic endocarditis.  Not receiving anticoagulation due to previous GI bleeding.  04/11/2006 1. Left ventricular septal myomectomy for idiopathic hypertrophic subaortic stenosis. 2. Mitral valve replacement with a 27-mm Biocor pericardial valve, model number B10-18M, serial number [id number].  Assessment & Plan    1. MV prosthetic endocarditis:  ID consultation and will need protracted course of intravenous antibiotics.  Unfortunately she  was already receiving oral antibiotics on admission and her blood cultures may be sterile.  She might require lifelong oral antibiotic suppression even after completing the IV antibiotics. 2.  Prosthetic mitral valve regurgitation: This is moderate and she has responded well to medical therapy. Surgery is not imminently necessary and regardless, she is by no means a candidate for redo heart surgery.  There might be an option for device closure of a periprosthetic leak, but this would have to be performed in a referral center. Meanwhile managed medically with diuretics and vasodilators. 3.  Permanent atrial fibrillation: Not on anticoagulants due to history of serious GI bleeding 4.  Acute chronic diastolic heart failure: primarily due to new mitral insufficiency, on background of hypertrophic cardiomyopathy with diastolic dysfunction.  Appears to be clinically euvolemic. 5.  Permanent pacemaker: Dual-chamber device programmed VVIR for atrial fibrillation.  There is evidence of endocarditis pacemaker lead involvement, but she is not a good candidate for lead extraction. Best bet is lifelong antibiotic suppression.     For questions or updates, please contact [facility] Please consult [email or web address] for contact info under Cardiology/STEMI.      Signed, Sanda Klein, MD  07/20/2017, 8:35 AM

## 2017-07-20 NOTE — Progress Notes (Signed)
PROGRESS NOTE    Jasmine [name]  LNL:[id number] DOB: 08-28-1924 DOA: 07/15/2017 PCP: [name], MD   Chief Complaint  Patient presents with  . Weakness  . Cough    Brief Narrative:   84 year old female, idiopathic hypertrophic subaortic stenosis cardiomyopathy,  persistent atrial fib flutter status post [place] pacemaker placement 4/13 not on anticoagulation secondary to suspected diverticular bleed (Dr. Mann)-cardioversion 2014, mitral valve replacement -- cardiac cath 03/2015 with widely patent coronary, SBO 03/2015 Hyperglycemia  Admitted for Acute respiratory failure secondary to pneumonia and CHF.  Breathing appears to be improving.  Patient found to have leak on echocardiogram, cardiology consulted, suspecting possible endocarditis.  TEE performed on 2/12 showing suspected culture-negative endocarditis Assessment & Plan   Multifactorial acute respiratory failure secondary to pneumonia CHF-stabilizing and improved continue duo nebs  Sepsis secondary to community-acquired pneumonia-influenza PCR was negative, placed on azithromycin and ceftriaxone and other lab work strep pneumo and Legionella [-]  Likely bacterial endocarditis based on TEE-defer to infectious disease and cardiology planning regarding need for treatment I have placed a call and LM for Dr. Lawana Chambers of dental see the patient and she has some caries which are evident on exam and she probably needs extractions of multiple teeth  Chronic diastolic CHF,-Echocardiogram 2/12 showed EF of 60-65%, no regional wall motion abnormalities Repeat chest x-ray noted above, will place patient on low-dose IV Lasix, strict I/O shows only -9 cc weight inaccurate 74-72, currently is not on Lasix  Chronic atrial fibrillation Chads score >4 Rate controlled with Cardizem 120 daily as well as metoprolol 50 twice daily, does not appear to be on Coumadin secondary to GI bleed  Pulmonary hypertension PA peak pressure 60  mmHg  History of hypertrophic obstructive cardiomyopathy Status post myomectomy-see above-stable  Chronic anemia Hemoglobin currently 8.8 and stable 8-9 range  Chronic kidney disease, stage III Creatinine currently appears stable, 1.10, continue to monitor BMP  Depression Continue Seroquel 25 hs and Namenda 10  DVT Prophylaxis Lovenox  Code Status: Full  Family Communication: None at bedside. Daughter via phone  Disposition Plan: Admitted.  Await Dentl input  Consultants Cardiology   Procedures  Echocardiogram  Antibiotics   Anti-infectives (From admission, onward)   Start     Dose/Rate Route Frequency Ordered Stop   07/20/17 1800  vancomycin (VANCOCIN) IVPB 1000 mg/200 mL premix     1,000 mg 200 mL/hr over 60 Minutes Intravenous Every 24 hours 07/19/17 1710     07/19/17 2000  cefTRIAXone (ROCEPHIN) 2 g in sodium chloride 0.9 % 100 mL IVPB     2 g 200 mL/hr over 30 Minutes Intravenous Every 24 hours 07/19/17 1635 07/23/17 1959   07/19/17 1800  vancomycin (VANCOCIN) 1,500 mg in sodium chloride 0.9 % 500 mL IVPB     1,500 mg 250 mL/hr over 120 Minutes Intravenous  Once 07/19/17 1710 07/19/17 2245   07/19/17 1645  rifampin (RIFADIN) capsule 300 mg     300 mg Oral Every 8 hours 07/19/17 1636     07/18/17 2000  cefTRIAXone (ROCEPHIN) 1 g in sodium chloride 0.9 % 100 mL IVPB  Status:  Discontinued     1 g 200 mL/hr over 30 Minutes Intravenous Every 24 hours 07/18/17 0901 07/19/17 1635   07/18/17 1000  azithromycin (ZITHROMAX) tablet 500 mg  Status:  Discontinued     500 mg Oral Daily 07/17/17 1328 07/19/17 1635   07/16/17 2000  cefTRIAXone (ROCEPHIN) 1 g in dextrose 5 % 50 mL IVPB  Status:  Discontinued     1 g 100 mL/hr over 30 Minutes Intravenous Every 24 hours 07/15/17 2204 07/18/17 0902   07/16/17 2000  azithromycin (ZITHROMAX) 500 mg in dextrose 5 % 250 mL IVPB  Status:  Discontinued     500 mg 250 mL/hr over 60 Minutes Intravenous Every 24 hours 07/15/17 2204  07/17/17 1328   07/15/17 1945  cefTRIAXone (ROCEPHIN) 1 g in dextrose 5 % 50 mL IVPB     1 g 100 mL/hr over 30 Minutes Intravenous  Once 07/15/17 1934 07/15/17 2100   07/15/17 1945  azithromycin (ZITHROMAX) 500 mg in dextrose 5 % 250 mL IVPB     500 mg 250 mL/hr over 60 Minutes Intravenous  Once 07/15/17 1934 07/15/17 2121      Subjective:   Awake alert pleasant did not eat much this morning however feeling fair Not short of breath Tells me her baseline weight is about 160 pounds No nausea no vomiting Does not feel swollen   Objective:   Vitals:   07/19/17 1630 07/19/17 2030 07/19/17 2119 07/20/17 0557  BP: 139/66  (!) 132/56 (!) 126/51  Pulse: 70  82 69  Resp: (!) 27   18  Temp:   97.6 F (36.4 C) 98.5 F (36.9 C)  TempSrc:   Oral Oral  SpO2: 95% 97% 100% 98%  Weight:    72.2 kg (159 lb 1.6 oz)  Height:        Intake/Output Summary (Last 24 hours) at 07/20/2017 0932 Last data filed at 07/20/2017 0552 Gross per 24 hour  Intake 420 ml  Output 750 ml  Net -330 ml   Filed Weights   07/18/17 0314 07/19/17 0529 07/20/17 0557  Weight: 71.8 kg (158 lb 3.2 oz) 71.4 kg (157 lb 8 oz) 72.2 kg (159 lb 1.6 oz)   Exam Pleasant oriented very poor dentition front of the mouth Edentulous at the back Mallampati 4, no JVD no bruit Abdomen soft nontender no rebound no guarding trace lower extremity edema Chest is clinically clear without added sound no rales no rhonchi I do not appreciate any bruit Neurologically intact alert and without distress moving all 4 limbs   Data Reviewed: I have personally reviewed following labs and imaging studies  CBC: Recent Labs  Lab 07/15/17 1826 07/16/17 0429 07/17/17 0620 07/18/17 0534 07/19/17 0453 07/20/17 0523  WBC 22.3* 14.8* 13.0* 11.8* 11.8* 12.5*  NEUTROABS 19.7*  --   --   --   --   --   HGB 9.5* 9.3* 9.5* 9.2* 9.2* 8.8*  HCT 30.1* 29.5* 30.0* 28.9* 29.5* 28.5*  MCV 88.8 88.3 88.0 87.6 89.1 89.6  PLT 359 329 369 387 396 462    Basic Metabolic Panel: Recent Labs  Lab 07/15/17 2209 07/16/17 0429 07/17/17 0620 07/18/17 0534 07/18/17 1122 07/19/17 0453 07/20/17 0523  NA  --  135 135 137  --  139 138  K  --  3.4* 3.3* 3.4*  --  3.9 3.6  CL  --  102 102 104  --  105 106  CO2  --  21* 21* 22  --  21* 21*  GLUCOSE  --  91 102* 101*  --  91 85  BUN  --  13 11 8   --  8 8  CREATININE  --  1.13* 1.10* 1.03*  --  1.10* 1.01*  CALCIUM  --  8.5* 8.3* 8.3*  --  8.4* 7.9*  MG 1.9  --   --   --  2.1  --   --    GFR: Estimated Creatinine Clearance: 37.7 mL/min (A) (by C-G formula based on SCr of 1.01 mg/dL (H)). Liver Function Tests: Recent Labs  Lab 07/15/17 1826  AST 25  ALT 18  ALKPHOS 74  BILITOT 0.9  PROT 7.3  ALBUMIN 2.9*   No results for input(s): LIPASE, AMYLASE in the last 168 hours. No results for input(s): AMMONIA in the last 168 hours. Coagulation Profile: Recent Labs  Lab 07/16/17 0429 07/17/17 0620 07/18/17 0534 07/19/17 0453 07/20/17 0523  INR 1.38 1.38 1.30 1.20 1.25   Cardiac Enzymes: Recent Labs  Lab 07/15/17 1826 07/15/17 2209 07/16/17 0429 07/16/17 0943  TROPONINI 0.05* 0.03* 0.03* 0.03*   BNP (last 3 results) No results for input(s): PROBNP in the last 8760 hours. HbA1C: No results for input(s): HGBA1C in the last 72 hours. CBG: No results for input(s): GLUCAP in the last 168 hours. Lipid Profile: No results for input(s): CHOL, HDL, LDLCALC, TRIG, CHOLHDL, LDLDIRECT in the last 72 hours. Thyroid Function Tests: No results for input(s): TSH, T4TOTAL, FREET4, T3FREE, THYROIDAB in the last 72 hours. Anemia Panel: No results for input(s): VITAMINB12, FOLATE, FERRITIN, TIBC, IRON, RETICCTPCT in the last 72 hours. Urine analysis:    Component Value Date/Time   COLORURINE YELLOW 04/22/2015 [place] 04/22/2015 1705   LABSPEC 1.024 04/22/2015 1705   PHURINE 6.0 04/22/2015 1705   GLUCOSEU NEGATIVE 04/22/2015 1705   HGBUR NEGATIVE 04/22/2015 1705    BILIRUBINUR NEGATIVE 04/22/2015 1705   KETONESUR NEGATIVE 04/22/2015 [place] 04/22/2015 1705   UROBILINOGEN 1.0 03/27/2015 2058   NITRITE NEGATIVE 04/22/2015 1705   LEUKOCYTESUR NEGATIVE 04/22/2015 1705   Sepsis Labs: @LABRCNTIP (procalcitonin:4,lacticidven:4)  ) Recent Results (from the past 240 hour(s))  Culture, blood (routine x 2)     Status: None (Preliminary result)   Collection Time: 07/15/17  6:30 PM  Result Value Ref Range Status   Specimen Description BLOOD LEFT HAND  Final   Special Requests   Final    BOTTLES DRAWN AEROBIC AND ANAEROBIC Blood Culture adequate volume   Culture   Final    NO GROWTH 4 DAYS Performed at [facility], [place] [address], [place], [place] [address]    Report Status PENDING  Incomplete  Culture, blood (routine x 2)     Status: None (Preliminary result)   Collection Time: 07/15/17  8:10 PM  Result Value Ref Range Status   Specimen Description BLOOD BLOOD RIGHT HAND  Final   Special Requests IN PEDIATRIC BOTTLE Blood Culture adequate volume  Final   Culture   Final    NO GROWTH 4 DAYS Performed at [facility], [place] [address] [place], [place] [address]    Report Status PENDING  Incomplete  Culture, sputum-assessment     Status: None   Collection Time: 07/16/17 11:21 AM  Result Value Ref Range Status   Specimen Description SPUTUM  Final   Special Requests NONE  Final   Sputum evaluation   Final    Sputum specimen not acceptable for testing.  Please recollect.   Gram Stain Report Called to,Read Back By and Verified With: L MCELWEE,RN AT 4696 07/16/17 BY L BENFIELD Performed at [facility], [place] [address] [place], [place] [address]    Report Status 07/16/2017 FINAL  Final      Radiology Studies: No results found.   Scheduled Meds: . brimonidine  1 drop Both Eyes Daily  And  . timolol  1 drop Both Eyes Daily  . budesonide  0.25 mg Nebulization BID  . diltiazem  120 mg Oral Daily  .  dorzolamide-timolol  1 drop Both Eyes BID  . enoxaparin (LOVENOX) injection  40 mg Subcutaneous Q24H  . ferrous sulfate  325 mg Oral Q breakfast  . memantine  10 mg Oral BID  . metoprolol tartrate  50 mg Oral BID  . pantoprazole  40 mg Oral BID AC  . QUEtiapine  25 mg Oral QHS  . rifampin  300 mg Oral Q8H  . vitamin E  400 Units Oral Daily   Continuous Infusions: . cefTRIAXone (ROCEPHIN)  IV Stopped (07/19/17 2330)  . vancomycin       LOS: 5 days   25 min  Verneita Griffes, MD [facility] Office  [phone]

## 2017-07-21 ENCOUNTER — Inpatient Hospital Stay (HOSPITAL_COMMUNITY): Payer: Medicare HMO

## 2017-07-21 ENCOUNTER — Encounter (HOSPITAL_COMMUNITY): Payer: Self-pay | Admitting: Dentistry

## 2017-07-21 DIAGNOSIS — K045 Chronic apical periodontitis: Secondary | ICD-10-CM

## 2017-07-21 DIAGNOSIS — K089 Disorder of teeth and supporting structures, unspecified: Secondary | ICD-10-CM

## 2017-07-21 DIAGNOSIS — I058 Other rheumatic mitral valve diseases: Secondary | ICD-10-CM

## 2017-07-21 DIAGNOSIS — I059 Rheumatic mitral valve disease, unspecified: Secondary | ICD-10-CM

## 2017-07-21 DIAGNOSIS — K036 Deposits [accretions] on teeth: Secondary | ICD-10-CM

## 2017-07-21 DIAGNOSIS — K0889 Other specified disorders of teeth and supporting structures: Secondary | ICD-10-CM

## 2017-07-21 DIAGNOSIS — K029 Dental caries, unspecified: Secondary | ICD-10-CM

## 2017-07-21 DIAGNOSIS — Z98811 Dental restoration status: Secondary | ICD-10-CM

## 2017-07-21 DIAGNOSIS — T826XXD Infection and inflammatory reaction due to cardiac valve prosthesis, subsequent encounter: Secondary | ICD-10-CM

## 2017-07-21 DIAGNOSIS — I5033 Acute on chronic diastolic (congestive) heart failure: Secondary | ICD-10-CM

## 2017-07-21 DIAGNOSIS — Z95828 Presence of other vascular implants and grafts: Secondary | ICD-10-CM

## 2017-07-21 DIAGNOSIS — M264 Malocclusion, unspecified: Secondary | ICD-10-CM

## 2017-07-21 LAB — CBC WITH DIFFERENTIAL/PLATELET
Basophils Absolute: 0.1 10*3/uL (ref 0.0–0.1)
Basophils Relative: 1 %
EOS ABS: 0.4 10*3/uL (ref 0.0–0.7)
EOS PCT: 3 %
HCT: 30.2 % — ABNORMAL LOW (ref 36.0–46.0)
Hemoglobin: 9.4 g/dL — ABNORMAL LOW (ref 12.0–15.0)
Lymphocytes Relative: 14 %
Lymphs Abs: 1.7 10*3/uL (ref 0.7–4.0)
MCH: 27.6 pg (ref 26.0–34.0)
MCHC: 31.1 g/dL (ref 30.0–36.0)
MCV: 88.6 fL (ref 78.0–100.0)
MONO ABS: 1 10*3/uL (ref 0.1–1.0)
MONOS PCT: 8 %
Neutro Abs: 9.2 10*3/uL — ABNORMAL HIGH (ref 1.7–7.7)
Neutrophils Relative %: 74 %
PLATELETS: 436 10*3/uL — AB (ref 150–400)
RBC: 3.41 MIL/uL — AB (ref 3.87–5.11)
RDW: 14.7 % (ref 11.5–15.5)
WBC: 12.5 10*3/uL — AB (ref 4.0–10.5)

## 2017-07-21 LAB — PROTIME-INR
INR: 1.28
Prothrombin Time: 15.9 seconds — ABNORMAL HIGH (ref 11.4–15.2)

## 2017-07-21 LAB — COMPREHENSIVE METABOLIC PANEL
ALK PHOS: 65 U/L (ref 38–126)
ALT: 12 U/L — AB (ref 14–54)
AST: 17 U/L (ref 15–41)
Albumin: 2.6 g/dL — ABNORMAL LOW (ref 3.5–5.0)
Anion gap: 11 (ref 5–15)
BUN: 5 mg/dL — ABNORMAL LOW (ref 6–20)
CALCIUM: 8.4 mg/dL — AB (ref 8.9–10.3)
CO2: 21 mmol/L — AB (ref 22–32)
CREATININE: 0.98 mg/dL (ref 0.44–1.00)
Chloride: 107 mmol/L (ref 101–111)
GFR calc non Af Amer: 49 mL/min — ABNORMAL LOW (ref >60)
GFR, EST AFRICAN AMERICAN: 57 mL/min — AB (ref >60)
Glucose, Bld: 90 mg/dL (ref 65–99)
Potassium: 3.9 mmol/L (ref 3.5–5.1)
SODIUM: 139 mmol/L (ref 135–145)
Total Bilirubin: 1.4 mg/dL — ABNORMAL HIGH (ref 0.3–1.2)
Total Protein: 6.7 g/dL (ref 6.5–8.1)

## 2017-07-21 LAB — MAGNESIUM: Magnesium: 2.2 mg/dL (ref 1.7–2.4)

## 2017-07-21 LAB — HCV COMMENT:

## 2017-07-21 LAB — HEPATITIS C ANTIBODY (REFLEX)

## 2017-07-21 MED ORDER — FUROSEMIDE 20 MG PO TABS
20.0000 mg | ORAL_TABLET | Freq: Every day | ORAL | Status: DC
  Administered 2017-07-21 – 2017-07-26 (×6): 20 mg via ORAL
  Filled 2017-07-21 (×6): qty 1

## 2017-07-21 MED ORDER — SODIUM CHLORIDE 0.9% FLUSH
10.0000 mL | INTRAVENOUS | Status: DC | PRN
  Administered 2017-07-26: 10 mL
  Filled 2017-07-21: qty 40

## 2017-07-21 NOTE — Consult Note (Signed)
   [facility] CM Inpatient Consult   07/21/2017  ISSABELA [name] May 18, 1925 [id number]  Patient screened for potential [place] Management services. Patient had previously been reached by [facility] about 2 years ago.  Met with the patient at the bedside.  Patient is a 84 year old with pneumonia and HF.  Patient's primary care provider is Dr. [name] of [place] and this office is listed to provide the transition of care follow up.  Patient acceptable of [facility] Care Management with Pneumonia [name] calls.  Patient accepted a brochure, 24 hour nurse advise line magnet and contact information to share with her daughter Jasmine [name] (not currently at the bedside).  Patient denies any needs and daughter provides transportation and helps her manage her medications. Patient is in the [place] of the [place] Management services under patient's [facility] plan.   For questions contact:   Natividad Brood, RN BSN [facility] Liaison  [phone] business mobile phone Toll free office [phone]

## 2017-07-21 NOTE — Progress Notes (Signed)
PROGRESS NOTE    Jasmine [name]  VOJ:[id number] DOB: 1924-11-25 DOA: 07/15/2017 PCP: [name], MD   Chief Complaint  Patient presents with  . Weakness  . Cough    Brief Narrative:   84 year old female, idiopathic hypertrophic subaortic stenosis cardiomyopathy,  persistent atrial fib flutter status post [place] pacemaker placement 4/13 not on anticoagulation secondary to suspected diverticular bleed (Dr. Mann)-cardioversion 2014, mitral valve replacement -- cardiac cath 03/2015 with widely patent coronary, SBO 03/2015 Hyperglycemia  Admitted for Acute respiratory failure secondary to pneumonia and CHF.  Breathing appears to be improving.  Patient found to have leak on echocardiogram, cardiology consulted, suspecting possible endocarditis.  TEE performed on 2/12 showing suspected culture-negative endocarditis Assessment & Plan   Multifactorial acute respiratory failure secondary to pneumonia CHF-stabilizing and improved continue duo nebs  Sepsis secondary to community-acquired pneumonia-influenza PCR was negative, placed on azithromycin and ceftriaxone and other lab work strep pneumo and Legionella [-]  Culture neg bacterial endocarditis based on TEE 2/12-defer to infectious disease and cardiology planning--Started on Vanc/ceftriaxone and Rifampin  Called Office of Dr. Enrique Sack probably needs extractions of multiple teeth--panorex looks to be in process and was ordered by them--await input re: timing  Chronic diastolic CHF,-Echocardiogram 2/12 showed EF of 60-65%, no regional wall motion abnormalities Appears to be off low-dose IV Lasix, strict I/O shows -409 cc weight 72 kg down from 74 on admit Chronic atrial fibrillation Chads score >4 slight elevated ~ 110 today-on Cardizem 120 daily,  metoprolol 50 twice daily, does not appear to be on Coumadin secondary to GI bleed  Pulmonary hypertension PA peak pressure 60 mmHg  History of hypertrophic obstructive cardiomyopathy  Status post myomectomy-see above-stable  Chronic anemia Hemoglobin currently 8.8 and stable 8-9 range  Chronic kidney disease, stage III Creatinine currently appears stable, 1.10, continue to monitor BMP  Depression Continue Seroquel 25 hs and Namenda 10  DVT Prophylaxis Lovenox  Code Status: Full  Family Communication: no family available today at bedside--will update as mor eis know regarding ental procedures etc  Disposition Plan: Admitted.  Await Dentl input  Consultants Cardiology  Infectious dx  Antibiotics  azithro 2/8-2/12 ceftriax 2/12 VAncomycin 2/12 Rifampin 2/13   Procedures  Echocardiogram  TeE 2/12  - Findings are consistent with infective endocarditis, with   moderate periprosthetic mitral insufficiency and a vegetation on   the ventricular pacemaker leads.  Antibiotics    Subjective:   Some n this am after taking many pills--was able to eat yoghurt however No cp, vomit or diarr No temp No cough nor cold  Objective:   Vitals:   07/20/17 2016 07/21/17 0351 07/21/17 0505 07/21/17 0845  BP: (!) 158/76  (!) 125/51 138/62  Pulse: 93  91 75  Resp: 20  20 18   Temp: 98.8 F (37.1 C)  99.1 F (37.3 C) 98.1 F (36.7 C)  TempSrc: Oral  Oral Oral  SpO2: 100%  98% 99%  Weight:  72.2 kg (159 lb 3.2 oz)    Height:        Intake/Output Summary (Last 24 hours) at 07/21/2017 0928 Last data filed at 07/21/2017 0915 Gross per 24 hour  Intake 1140 ml  Output 1300 ml  Net -160 ml   Filed Weights   07/19/17 0529 07/20/17 0557 07/21/17 0351  Weight: 71.4 kg (157 lb 8 oz) 72.2 kg (159 lb 1.6 oz) 72.2 kg (159 lb 3.2 oz)   Exam  Edentulous Partial denture in lower HSM and M across precodium best head  in LUSE Slight jvd no bruit No LE edema Neuro intact moving extremities x 4 no focal deficit   Data Reviewed: I have personally reviewed following labs and imaging studies  CBC: Recent Labs  Lab 07/15/17 1826  07/17/17 0620 07/18/17 0534  07/19/17 0453 07/20/17 0523 07/21/17 0558  WBC 22.3*   < > 13.0* 11.8* 11.8* 12.5* 12.5*  NEUTROABS 19.7*  --   --   --   --   --  9.2*  HGB 9.5*   < > 9.5* 9.2* 9.2* 8.8* 9.4*  HCT 30.1*   < > 30.0* 28.9* 29.5* 28.5* 30.2*  MCV 88.8   < > 88.0 87.6 89.1 89.6 88.6  PLT 359   < > 369 387 396 399 436*   < > = values in this interval not displayed.   Basic Metabolic Panel: Recent Labs  Lab 07/15/17 2209  07/17/17 0620 07/18/17 0534 07/18/17 1122 07/19/17 0453 07/20/17 0523 07/21/17 0558  NA  --    < > 135 137  --  139 138 139  K  --    < > 3.3* 3.4*  --  3.9 3.6 3.9  CL  --    < > 102 104  --  105 106 107  CO2  --    < > 21* 22  --  21* 21* 21*  GLUCOSE  --    < > 102* 101*  --  91 85 90  BUN  --    < > 11 8  --  8 8 5*  CREATININE  --    < > 1.10* 1.03*  --  1.10* 1.01* 0.98  CALCIUM  --    < > 8.3* 8.3*  --  8.4* 7.9* 8.4*  MG 1.9  --   --   --  2.1  --   --  2.2   < > = values in this interval not displayed.   GFR: Estimated Creatinine Clearance: 38.8 mL/min (by C-G formula based on SCr of 0.98 mg/dL). Liver Function Tests: Recent Labs  Lab 07/15/17 1826 07/21/17 0558  AST 25 17  ALT 18 12*  ALKPHOS 74 65  BILITOT 0.9 1.4*  PROT 7.3 6.7  ALBUMIN 2.9* 2.6*   No results for input(s): LIPASE, AMYLASE in the last 168 hours. No results for input(s): AMMONIA in the last 168 hours. Coagulation Profile: Recent Labs  Lab 07/17/17 0620 07/18/17 0534 07/19/17 0453 07/20/17 0523 07/21/17 0558  INR 1.38 1.30 1.20 1.25 1.28   Cardiac Enzymes: Recent Labs  Lab 07/15/17 1826 07/15/17 2209 07/16/17 0429 07/16/17 0943  TROPONINI 0.05* 0.03* 0.03* 0.03*    Urine analysis:    Component Value Date/Time   COLORURINE YELLOW 04/22/2015 1705   APPEARANCEUR CLEAR 04/22/2015 1705   LABSPEC 1.024 04/22/2015 1705   PHURINE 6.0 04/22/2015 1705   GLUCOSEU NEGATIVE 04/22/2015 1705   HGBUR NEGATIVE 04/22/2015 1705   BILIRUBINUR NEGATIVE 04/22/2015 1705   KETONESUR  NEGATIVE 04/22/2015 1705   PROTEINUR NEGATIVE 04/22/2015 1705   UROBILINOGEN 1.0 03/27/2015 2058   NITRITE NEGATIVE 04/22/2015 1705   LEUKOCYTESUR NEGATIVE 04/22/2015 1705   Sepsis Labs:   Radiology Studies: No results found.   Scheduled Meds: . brimonidine  1 drop Both Eyes Daily   And  . timolol  1 drop Both Eyes Daily  . budesonide  0.25 mg Nebulization BID  . diltiazem  120 mg Oral Daily  . dorzolamide-timolol  1 drop Both Eyes BID  . enoxaparin (LOVENOX)  injection  40 mg Subcutaneous Q24H  . ferrous sulfate  325 mg Oral Q breakfast  . memantine  10 mg Oral BID  . metoprolol tartrate  50 mg Oral BID  . pantoprazole  40 mg Oral BID AC  . QUEtiapine  25 mg Oral QHS  . rifampin  300 mg Oral Q8H  . vitamin E  400 Units Oral Daily   Continuous Infusions: . cefTRIAXone (ROCEPHIN)  IV Stopped (07/20/17 1623)  . vancomycin Stopped (07/20/17 1751)     LOS: 6 days   25 min  Verneita Griffes, MD [facility] Office  [phone]

## 2017-07-21 NOTE — Progress Notes (Addendum)
Progress Note  Patient Name: Jasmine [name] Date of Encounter: 07/21/2017  Primary Cardiologist: Dr Radford Pax  Subjective   Some nausea this am  Inpatient Medications    Scheduled Meds: . brimonidine  1 drop Both Eyes Daily   And  . timolol  1 drop Both Eyes Daily  . budesonide  0.25 mg Nebulization BID  . diltiazem  120 mg Oral Daily  . dorzolamide-timolol  1 drop Both Eyes BID  . enoxaparin (LOVENOX) injection  40 mg Subcutaneous Q24H  . ferrous sulfate  325 mg Oral Q breakfast  . memantine  10 mg Oral BID  . metoprolol tartrate  50 mg Oral BID  . pantoprazole  40 mg Oral BID AC  . QUEtiapine  25 mg Oral QHS  . rifampin  300 mg Oral Q8H  . vitamin E  400 Units Oral Daily   Continuous Infusions: . cefTRIAXone (ROCEPHIN)  IV Stopped (07/20/17 1623)  . vancomycin Stopped (07/20/17 1751)   PRN Meds: acetaminophen **OR** acetaminophen, fluticasone, guaiFENesin, ipratropium-albuterol, nitroGLYCERIN, ondansetron **OR** ondansetron (ZOFRAN) IV   Vital Signs    Vitals:   07/20/17 2006 07/20/17 2016 07/21/17 0351 07/21/17 0505  BP:  (!) 158/76  (!) 125/51  Pulse: 76 93  91  Resp: 18 20  20   Temp:  98.8 F (37.1 C)  99.1 F (37.3 C)  TempSrc:  Oral  Oral  SpO2: 97% 100%  98%  Weight:   159 lb 3.2 oz (72.2 kg)   Height:        Intake/Output Summary (Last 24 hours) at 07/21/2017 0804 Last data filed at 07/21/2017 0659 Gross per 24 hour  Intake 1020 ml  Output 1300 ml  Net -280 ml   Filed Weights   07/19/17 0529 07/20/17 0557 07/21/17 0351  Weight: 157 lb 8 oz (71.4 kg) 159 lb 1.6 oz (72.2 kg) 159 lb 3.2 oz (72.2 kg)    Telemetry    AFib, intermittent V pacing - Personally Reviewed  ECG    No new tracing - Personally Reviewed  Physical Exam  Comfortable, eating breakfast GEN: No acute distress.   HEENT: poor dentition Neck: No JVD Cardiac: irregular, 3/6 apical holosystolic murmur, no diastolic murmurs, rubs, or gallops.  Respiratory: basilar  crackles GI: Soft, nontender, non-distended  MS: No edema; No deformity. Neuro:  Nonfocal  Psych: Normal affect   Labs    Chemistry Recent Labs  Lab 07/15/17 1826  07/19/17 0453 07/20/17 0523 07/21/17 0558  NA 136   < > 139 138 139  K 4.1   < > 3.9 3.6 3.9  CL 103   < > 105 106 107  CO2 19*   < > 21* 21* 21*  GLUCOSE 97   < > 91 85 90  BUN 13   < > 8 8 5*  CREATININE 1.08*   < > 1.10* 1.01* 0.98  CALCIUM 8.7*   < > 8.4* 7.9* 8.4*  PROT 7.3  --   --   --  6.7  ALBUMIN 2.9*  --   --   --  2.6*  AST 25  --   --   --  17  ALT 18  --   --   --  12*  ALKPHOS 74  --   --   --  65  BILITOT 0.9  --   --   --  1.4*  GFRNONAA 44*   < > 43* 48* 49*  GFRAA 51*   < >  50* 55* 57*  ANIONGAP 14   < > 13 11 11    < > = values in this interval not displayed.     Hematology Recent Labs  Lab 07/19/17 0453 07/20/17 0523 07/21/17 0558  WBC 11.8* 12.5* 12.5*  RBC 3.31* 3.18* 3.41*  HGB 9.2* 8.8* 9.4*  HCT 29.5* 28.5* 30.2*  MCV 89.1 89.6 88.6  MCH 27.8 27.7 27.6  MCHC 31.2 30.9 31.1  RDW 14.6 14.6 14.7  PLT 396 399 436*    Cardiac Enzymes Recent Labs  Lab 07/15/17 1826 07/15/17 2209 07/16/17 0429 07/16/17 0943  TROPONINI 0.05* 0.03* 0.03* 0.03*   No results for input(s): TROPIPOC in the last 168 hours.   BNP Recent Labs  Lab 07/15/17 1826  BNP 366.4*     DDimer No results for input(s): DDIMER in the last 168 hours.   Radiology    No results found.  Cardiac Studies   TEE 07/19/2017  - Left ventricle: The cavity size was normal. There was moderate   concentric hypertrophy. Systolic function was normal. The   estimated ejection fraction was in the range of 60% to 65%. - Aortic valve: No evidence of vegetation. There was mild   regurgitation. - Mitral valve: A bioprosthesis was present. The sewing ring was   partially dehisced, but the valve remains well seated, without   rocking. There was a 3-4 mm gap between the mitral annulus and   the prosthetic ring,  over a roughly 8-10 mm long arc at the   posterior aspect of the ring. No vegetations are seen on the   prosthetic leaflets. Wispy echogenic structures along the atrial   surface of the sewing ring likely represent sewing material, not   vegetations. There was mild regurgitation directed centrally.   There was moderate perivalvular regurgitation. - Left atrium: The atrium was moderately to severely dilated. No   evidence of thrombus in the atrial cavity or appendage. There was   mildcontinuous spontaneous echo contrast (&quot;smoke&quot;) in the   appendage. - Right atrium: A 6-7 mm mobile mass was attached to the atrial   portion of the ventricular pacing lead. This is consistent with a   vegetation. The atrium was dilated. - Atrial septum: No defect or patent foramen ovale was identified. - Tricuspid valve: No evidence of vegetation. - Pulmonic valve: No evidence of vegetation. - Line: Two venous pacing wires were visualized in the right atrial   cavity and right ventricular cavity, attached to the right atrial   appendage and RV apex, respectively.  Impressions:  - Findings are consistent with infective endocarditis, with   moderate periprosthetic mitral insufficiency and a vegetation on   the ventricular pacemaker leads.  Echo 07/17/17 Study Conclusions  - Left ventricle: The cavity size was normal. There was moderate concentric hypertrophy. Systolic function was normal. The estimated ejection fraction was in the range of 60% to 65%. Wall motion was normal; there were no regional wall motion abnormalities. - Aortic valve: There was mild to moderate regurgitation directed centrally in the LVOT. - Mitral valve: A bioprosthesis was present. The prosthesis appears well seated, without rocking motion. There is at least moderate mitral insufficiency, most of which appears to be perivalvular, at the posterior aspect of the prosthetic annulus. - Left atrium: The  atrium was moderately to severely dilated. - Right atrium: The atrium was mildly dilated. - Tricuspid valve: There was moderate regurgitation directed centrally. - Pulmonary arteries: Systolic pressure was moderately increased. PA peak pressure: 60  mm Hg (S).  Recommendations:  1. Consider transesophageal echocardiography to evaluate mitral perivalvular regurgitation. If the patient has fever or leukocytosis, blood cultures are appropriate. 2. Due to technical problems, the previous images could not be reviewed, but periannular mitral insufficiency was not previously reported.   Left Heart Cath and Coronary Angiography10/2016  Conclusion   1. Angiographically normal CAD with very tortuous vessels. 2. The left ventricular systolic function is normal. 3. Normal LVEDP       Patient Profile     84 y.o. female with history of mitral regurgitation and hypertrophic cardiomyopathy, status post septal myomectomy and mitral valve replacement with 27 mm bioprosthesis in 2007, normal coronary arteries by angiography 2016, permanent atrial fibrillation with slow ventricular response status post pacemaker implantation ([place] DR 2013, not pacemaker dependent) presents with acute heart failure exacerbation in the setting of protracted febrile illness, quickly responsive to antibiotic therapy, with echo showing evidence of perivalvular mitral insufficiency, raising suspicion for bioprosthetic endocarditis.  Not receiving anticoagulation due to previous GI bleeding.  04/11/2006 1. Left ventricular septal myomectomy for idiopathic hypertrophic subaortic stenosis. 2. Mitral valve replacement with a 27-mm Biocor pericardial valve, model number B10-12M, serial number [id number].  Assessment & Plan    1. MV prosthetic endocarditis:  ID following. She will need protracted course of intravenous antibiotics.  Unfortunately she was already receiving oral  antibiotics on admission and her blood cultures may be sterile.  She might require lifelong oral antibiotic suppression even after completing the IV antibiotics.  2.  Prosthetic mitral valve regurgitation: This is moderate and she has responded well to medical therapy. Surgery is not imminently necessary and regardless, she is by no means a candidate for redo heart surgery.  There might be an option for device closure of a periprosthetic leak, but this would have to be performed in a referral center. Meanwhile managed medically with diuretics and vasodilators.  3.  Permanent atrial fibrillation: Not on anticoagulants due to history of serious GI bleeding  4.  Acute chronic diastolic heart failure: primarily due to new mitral insufficiency, on background of hypertrophic cardiomyopathy with diastolic dysfunction.  Appears to be clinically euvolemic. Wgt down 8 lbs from 07/15/17, at her baseline wgt now.    5.  Permanent pacemaker: Dual-chamber device programmed VVIR for atrial fibrillation.  There is evidence of endocarditis pacemaker lead involvement, but she is not a good candidate for lead extraction. Best bet is lifelong antibiotic suppression.  6. Poor dentition: she needs her teeth extracted this admission.    Plan:  She is currently not on a diuretic. Her BUN is 5. She has crackles on exam but also noted to have chronic interstitial lung disease on CXR 07/18/17.  I suspect her nausea may be from ABs. She is on BID PPI.    Awaiting dental input.   Signed, Kerin Ransom, PA-C  07/21/2017, 8:04 AM    I have seen and examined the patient along with Kerin Ransom, PA-C .  I have reviewed the chart, notes and new data.  I agree with PA's note.  Key new complaints: Nausea has improved since this morning.  Agree that it is probably from antibiotics.  Denies dyspnea Key examination changes: Unchanged apical holosystolic murmur, regular rhythm, no obvious JVD or edema.  Key new findings / data: Stable  renal function and hemoglobin  PLAN: Seems to be stable on current dose of diuretics.  Increase diuretics if weight exceeds 160 lb. Dental evaluation. PICC line. Antibiotics per  Dr. Lucianne Lei Dam's recommendations. Recheck transthoracic echo in a month.  Sanda Klein, MD, [facility] [phone] 07/21/2017, 10:36 AM

## 2017-07-21 NOTE — Consult Note (Signed)
DENTAL CONSULTATION  Date of Consultation:  07/21/2017 Patient Name:   Jasmine [name] Date of Birth:   04-Jan-1925 Medical Record Number: [id number]  VITALS: BP 138/62 (BP Location: Right Arm)   Pulse 75   Temp 98.1 F (36.7 C) (Oral)   Resp 18   Ht 5\' 6"  (1.676 m)   Wt 159 lb 3.2 oz (72.2 kg)   SpO2 93%   BMI 25.70 kg/m   CHIEF COMPLAINT: The patient was referred by Dr. Verlon Au for a dental consultation.  HPI: JAELYN BOURGOIN was recently diagnosed with endocarditis of the mitral valve.  Patient is currently on IV antibiotic therapy.  A dental consultation was requested to evaluate poor dentition as a possible source of the endocarditis and to provide treatment as indicated.  The patient currently denies acute toothaches, swelling, or abscesses.  Patient was last seen approximately 1 year ago for an extraction by an oral Psychologist, sport and exercise.  Patient usually sees her primary dentist, Dr. Lovena Neighbours for routine care.  Patient has an upper complete denture that "fits okay".  Patient has a lower partial denture that also" fits okay".  Patient denies having dental phobia.  PROBLEM LIST: Patient Active Problem List   Diagnosis Date Noted  . Endocarditis of mitral valve   . Acute on chronic diastolic heart failure ([place])   . SOB (shortness of breath)   . ICD (implantable cardioverter-defibrillator) infection ([place])   . Prosthetic valve endocarditis ([place])   . Poor dentition   . Acute infective endocarditis   . Acute respiratory failure with hypoxia ([place]) 07/15/2017  . Community acquired pneumonia 07/15/2017  . Small bowel obstruction due to adhesions ([place]) 03/29/2015  . SBO (small bowel obstruction) ([place])   . Abnormal finding on EKG 03/27/2015  . Essential hypertension   . ST-segment elevation myocardial infarction (STEMI) of inferior wall ([place])   . Epigastric pain   . GIB (gastrointestinal bleeding) 10/18/2014  . Non-obstructive coronary artery disease   . Atypical chest pain 06/23/2014   . Chronic kidney disease (CKD), stage III (moderate) ([place]) 06/23/2014  . DVT prophylaxis 06/23/2014  . Systolic and diastolic CHF, chronic ([place])   . Gastroesophageal reflux disease without esophagitis   . Mitral valve regurgitation   . Hyperglycemia   . Coronary artery disease, non-occlusive by cardiac cath 06/11/2014  . Demand ischemia of myocardium - converted from diagnosis of non-STEMI   . Chronic combined systolic and diastolic CHF (congestive heart failure) ([place])   . IHSS (idiopathic hypertrophic subaortic stenosis) ([place])   . Sinus bradycardia 02/20/2014  . Reflux esophagitis   . Pulmonary HTN ([place])   . Severe mitral regurgitation   . PVCs (premature ventricular contractions)   . DCM (dilated cardiomyopathy) ([place])   . GI bleed 08/16/2012  . Benign neoplasm of colon 08/13/2012  . History of Diverticulosis of colon with hemorrhage 08/12/2012  . Warfarin anticoagulation 08/12/2012  . History of mitral valve replacement with bioprosthetic valve 08/12/2012  . Chronic atrial fibrillation ([place]) 07/04/2012  . Pacemaker-St.Jude 10/11/2011  . RBBB 10/07/2011  . Keloid 10/07/2011    PMH: Past Medical History:  Diagnosis Date  . Aortic insufficiency    a. 06/2014 Echo: Mild AI.  Marland Kitchen Asthma   . Atrial fibrillation and flutter    a. atrial fibrillation s/p DCCV 06-2012;  b. now in chronic atrial fibrillation off anticoagulation due to GI bleed.  . Bradycardia    a. 10/2011 s/p SJM Accent DR DC PPM, ser #: [id number].  Marland Kitchen Chronic kidney  disease (CKD), stage III (moderate) ([place])   . Dementia   . Diverticulosis of colon with hemorrhage 08/12/2012  . Essential hypertension   . GERD (gastroesophageal reflux disease)   . Glaucoma   . History of GI Bleed    a. prevents use of [place].  Marland Kitchen History of Severe Mitral Regurgitation    a. s/p MVR with pericardial tissue valve;  b. 06/2014 Mild Mitral Stenosis by echo.  Marland Kitchen HOCM (hypertrophic obstructive cardiomyopathy) ([place])    a. s/p septal myomectomy;   b. 06/2014 Echo: EF 50-55%, mild antsept HK, mild MR, mild MS/MR (bioprosth valve), mildly dil LA, mod TR, PASP 19mmHg.  Marland Kitchen Hyperlipidemia   . Insomnia   . Non-obstructive coronary artery disease    a. 06/2014 NSTEMI/Cath: LM nl, LAD nl, LCX nl, RCA 10-34m, EF 45%.  . Pulmonary HTN ([place])    a. mild to moderate (PASP 71mmHg by echo 06/2014).  . Reflux esophagitis     PSH: Past Surgical History:  Procedure Laterality Date  . APPENDECTOMY    . CARDIAC CATHETERIZATION    . CARDIAC CATHETERIZATION N/A 03/27/2015   Procedure: Left Heart Cath and Coronary Angiography;  Surgeon: Leonie Man, MD;  Location: [facility];  Service: Cardiovascular;  Laterality: N/A;  . CARDIAC VALVE REPLACEMENT  2007   MVR  . CARDIOVERSION  07/04/2012   Procedure: CARDIOVERSION;  Surgeon: Sueanne Margarita, MD;  Location: [facility];  Service: Cardiovascular;  Laterality: N/A;  h/p in file drawer   . CATARACT EXTRACTION W/ INTRAOCULAR LENS  IMPLANT, BILATERAL    . CHOLECYSTECTOMY    . COLON RESECTION    . COLONOSCOPY    . COLONOSCOPY N/A 08/13/2012   Procedure: COLONOSCOPY;  Surgeon: Gatha Mayer, MD;  Location: [facility];  Service: Endoscopy;  Laterality: N/A;  . HERNIA REPAIR     "in my stomach"  . LEFT HEART CATHETERIZATION WITH CORONARY ANGIOGRAM N/A 06/11/2014   Procedure: LEFT HEART CATHETERIZATION WITH CORONARY ANGIOGRAM;  Surgeon: Troy Sine, MD;  Location: [facility] CATH LAB;  Service: Cardiovascular;  Laterality: N/A;  . MYOMECTOMY  2007   septal  . PERMANENT PACEMAKER INSERTION N/A 10/07/2011   Procedure: PERMANENT PACEMAKER INSERTION;  Surgeon: Deboraha Sprang, MD;  Location: [facility] CATH LAB;  Service: Cardiovascular;  Laterality: N/A;  . TEE WITHOUT CARDIOVERSION N/A 07/19/2017   Procedure: TRANSESOPHAGEAL ECHOCARDIOGRAM (TEE);  Surgeon: Sanda Klein, MD;  Location: [facility] ENDOSCOPY;  Service: Cardiovascular;  Laterality: N/A;  . TONSILLECTOMY    . TUBAL LIGATION      ALLERGIES: Allergies   Allergen Reactions  . Cymbalta [Duloxetine Hcl] Diarrhea and Other (See Comments)    weakness  . Dexilant [Dexlansoprazole] Diarrhea and Other (See Comments)    Bloating also    MEDICATIONS: Current Facility-Administered Medications  Medication Dose Route Frequency Provider Last Rate Last Dose  . acetaminophen (TYLENOL) tablet 650 mg  650 mg Oral Q6H PRN Rise Patience, MD       Or  . acetaminophen (TYLENOL) suppository 650 mg  650 mg Rectal Q6H PRN Rise Patience, MD      . brimonidine (ALPHAGAN) 0.2 % ophthalmic solution 1 drop  1 drop Both Eyes Daily Rise Patience, MD   1 drop at 07/20/17 1100   And  . timolol (TIMOPTIC) 0.5 % ophthalmic solution 1 drop  1 drop Both Eyes Daily Rise Patience, MD   1 drop at 07/20/17 1100  . budesonide (PULMICORT) nebulizer solution 0.25 mg  0.25 mg Nebulization BID Rise Patience, MD   0.25 mg at 07/21/17 0938  . cefTRIAXone (ROCEPHIN) 2 g in sodium chloride 0.9 % 100 mL IVPB  2 g Intravenous Q24H Tommy Medal, Lavell Islam, MD   Stopped at 07/20/17 1623  . diltiazem (CARDIZEM CD) 24 hr capsule 120 mg  120 mg Oral Daily Rise Patience, MD   120 mg at 07/20/17 1100  . dorzolamide-timolol (COSOPT) 22.3-6.8 MG/ML ophthalmic solution 1 drop  1 drop Both Eyes BID Rise Patience, MD   1 drop at 07/20/17 2145  . enoxaparin (LOVENOX) injection 40 mg  40 mg Subcutaneous Q24H Charlton Haws, [place]   40 mg at 07/21/17 1829  . ferrous sulfate tablet 325 mg  325 mg Oral Q breakfast Rise Patience, MD   325 mg at 07/21/17 9371  . fluticasone (FLONASE) 50 MCG/ACT nasal spray 1 spray  1 spray Each Nare Daily PRN Rise Patience, MD      . guaiFENesin (MUCINEX) 12 hr tablet 600 mg  600 mg Oral BID PRN Rise Patience, MD      . ipratropium-albuterol (DUONEB) 0.5-2.5 (3) MG/3ML nebulizer solution 3 mL  3 mL Nebulization Q4H PRN Cristal Ford, DO   3 mL at 07/16/17 1657  . memantine (NAMENDA) tablet 10 mg  10 mg  Oral BID Rise Patience, MD   10 mg at 07/20/17 2145  . metoprolol tartrate (LOPRESSOR) tablet 50 mg  50 mg Oral BID Rise Patience, MD   50 mg at 07/20/17 2145  . nitroGLYCERIN (NITROSTAT) SL tablet 0.4 mg  0.4 mg Sublingual Q5 min PRN Rise Patience, MD      . ondansetron [facility]) tablet 4 mg  4 mg Oral Q6H PRN Rise Patience, MD       Or  . ondansetron [facility]) injection 4 mg  4 mg Intravenous Q6H PRN Rise Patience, MD   4 mg at 07/21/17 6967  . pantoprazole (PROTONIX) EC tablet 40 mg  40 mg Oral BID AC Rise Patience, MD   40 mg at 07/21/17 0618  . QUEtiapine (SEROQUEL) tablet 25 mg  25 mg Oral QHS Rise Patience, MD   25 mg at 07/20/17 2145  . rifampin (RIFADIN) capsule 300 mg  300 mg Oral Q8H Tommy Medal, Lavell Islam, MD   300 mg at 07/21/17 8938  . vancomycin (VANCOCIN) IVPB 1000 mg/200 mL premix  1,000 mg Intravenous Q24H Yopp, Amber C, RPH   Stopped at 07/20/17 1751  . vitamin E capsule 400 Units  400 Units Oral Daily Rise Patience, MD   400 Units at 07/20/17 1100    LABS: Lab Results  Component Value Date   WBC 12.5 (H) 07/21/2017   HGB 9.4 (L) 07/21/2017   HCT 30.2 (L) 07/21/2017   MCV 88.6 07/21/2017   PLT 436 (H) 07/21/2017      Component Value Date/Time   NA 139 07/21/2017 0558   K 3.9 07/21/2017 0558   CL 107 07/21/2017 0558   CO2 21 (L) 07/21/2017 0558   GLUCOSE 90 07/21/2017 0558   BUN 5 (L) 07/21/2017 0558   CREATININE 0.98 07/21/2017 0558   CALCIUM 8.4 (L) 07/21/2017 0558   GFRNONAA 49 (L) 07/21/2017 0558   GFRAA 57 (L) 07/21/2017 0558   Lab Results  Component Value Date   INR 1.28 07/21/2017   INR 1.25 07/20/2017   INR 1.20 07/19/2017   No results found for: PTT  SOCIAL HISTORY: Social History   Socioeconomic History  . Marital status: Widowed    Spouse name: Not on file  . Number of children: 3  . Years of education: Not on file  . Highest education level: Not on file  Social Needs  . Financial  resource strain: Not on file  . Food insecurity - worry: Not on file  . Food insecurity - inability: Not on file  . Transportation needs - medical: Not on file  . Transportation needs - non-medical: Not on file  Occupational History  . Not on file  Tobacco Use  . Smoking status: Former Smoker    Types: Cigarettes    Last attempt to quit: 06/07/1974    Years since quitting: 43.1  . Smokeless tobacco: Former Systems developer    Types: Snuff    Quit date: 04/23/1975  . Tobacco comment: 10/05/11 "  Substance and Sexual Activity  . Alcohol use: No    Alcohol/week: 0.0 oz    Comment: 10/05/11 "used to drink; quit a long time ago"  . Drug use: No  . Sexual activity: No  Other Topics Concern  . Not on file  Social History Narrative  . Not on file    FAMILY HISTORY: Family History  Problem Relation Age of Onset  . CVA Mother   . Hypertension Mother   . CVA Brother   . Hypertension Brother     REVIEW OF SYSTEMS: Reviewed with the patient as per History of present illness. Psych: Patient denies having dental phobia.  DENTAL HISTORY: CHIEF COMPLAINT: The patient was referred by Dr. Verlon Au for a dental consultation.  HPI: TANA TREFRY was recently diagnosed with endocarditis of the mitral valve.  Patient is currently on IV antibiotic therapy.  A dental consultation was requested to evaluate poor dentition as a possible source of the endocarditis and to provide treatment as indicated.  The patient currently denies acute toothaches, swelling, or abscesses.  Patient was last seen approximately 1 year ago for an extraction by an oral Psychologist, sport and exercise.  Patient usually sees her primary dentist, Dr. Lovena Neighbours for routine care.  Patient has an upper complete denture that "fits okay".  Patient has a lower partial denture that also" fits okay".  Patient denies having dental phobia.  DENTAL EXAMINATION: GENERAL: Patient is a well-developed, well-nourished female in no acute distress. HEAD AND NECK: There  is no palpable neck lymphadenopathy.  The patient denies acute TMJ symptoms. INTRAORAL EXAM: The patient has normal saliva.  There is no evidence of oral abscess formation. DENTITION: Patient is missing all maxillary teeth.  Patient is also missing tooth numbers 17 through 21, and 20-32.  Tooth #26 is present as a  retained root segment. PERIODONTAL: Patient has chronic periodontitis with plaque and calculus accumulations, gingival recession, and tooth mobility.  There is incipient to moderate bone loss noted. DENTAL CARIES/SUBOPTIMAL RESTORATIONS: Multiple dental caries are noted.  The patient has extensive dental caries associated with tooth numbers 26 and 27. ENDODONTIC: Patient currently denies acute pulpitis symptoms.  Patient does have periapical pathology associated with the apex of tooth #26.  The caries are impinging on the pulp of tooth #27 as well. CROWN AND BRIDGE: There are no crown or bridge restorations noted. PROSTHODONTIC: Patient has an upper complete denture and a lower acrylic partial denture.  These are less than ideal in regard to retention and stability. OCCLUSION: Patient has a poor occlusal scheme.  RADIOGRAPHIC INTERPRETATION: An orthopantogram was taken this morning. There are  multiple missing teeth with the exception of tooth numbers 22 through 27.  Tooth #26 is present as a retained root segment.  There is periapical pathology and radiolucency associated with the apex tooth #26.  Dental caries are noted.  There is incipient a moderate bone loss noted.   ASSESSMENTS: 1.  Endocarditis of the mitral valve 2.  Chronic apical periodontitis 3.  Retained root segment #26 4.  Extensive dental caries. 5.  Chronic periodontitis of bone loss 6.  Accretions 7.  Gingival recession 8.  Tooth mobility 9.  Multiple missing teeth 10.  Less than ideal upper complete and lower acrylic partial dentures 11.  Poor occlusal scheme and malocclusion 12.  Risk for bleeding with invasive  dental procedures due to current Lovenox therapy 13.  Risk for complications with anticipated invasive dental procedure in the operating room with general anesthesia up to and including death due to her overall cardiovascular and respiratory compromise.   PLAN/RECOMMENDATIONS: 1. I discussed the risks, benefits, and complications of various treatment options with the patient in relationship to her medical and dental conditions, endocarditis diagnosis, and risk for future endocarditis.  We discussed various treatment options to include no treatment, multiple extractions with alveoloplasty, pre-prosthetic surgery as indicated, periodontal therapy, dental restorations, root canal therapy, crown and bridge therapy, implant therapy, and replacement of missing teeth as indicated. The patient currently wishes to proceed with extraction of remaining lower teeth with alveoloplasty in the operating with general anesthesia.  This has been scheduled on Monday, 07/25/17 at 7:30 AM.  Patient should be able to be discharged Monday evening if no complications from the dental procedures.  The patient will then need to follow-up with a dentist of her choice for fabrication of new upper and lower complete dentures or new lower denture to manage his existing upper denture as indicated after adequate healing.   2. Discussion of findings with medical team and coordination of future medical and dental care as needed.    Lenn Cal, DDS

## 2017-07-21 NOTE — Progress Notes (Signed)
Peripherally Inserted Central Catheter/Midline Placement  The IV Nurse has discussed with the patient and/or persons authorized to consent for the patient, the purpose of this procedure and the potential benefits and risks involved with this procedure.  The benefits include less needle sticks, lab draws from the catheter, and the patient may be discharged home with the catheter. Risks include, but not limited to, infection, bleeding, blood clot (thrombus formation), and puncture of an artery; nerve damage and irregular heartbeat and possibility to perform a PICC exchange if needed/ordered by physician.  Alternatives to this procedure were also discussed.  Bard Power PICC patient education guide, fact sheet on infection prevention and patient information card has been provided to patient /or left at bedside.    PICC/Midline Placement Documentation        Jule Economy Horton 07/21/2017, 1:19 PM

## 2017-07-21 NOTE — Progress Notes (Signed)
Subjective: No new complaints.   Antibiotics:  Anti-infectives (From admission, onward)   Start     Dose/Rate Route Frequency Ordered Stop   07/20/17 1600  vancomycin (VANCOCIN) IVPB 1000 mg/200 mL premix     1,000 mg 200 mL/hr over 60 Minutes Intravenous Every 24 hours 07/19/17 1710     07/20/17 1600  cefTRIAXone (ROCEPHIN) 2 g in sodium chloride 0.9 % 100 mL IVPB     2 g 200 mL/hr over 30 Minutes Intravenous Every 24 hours 07/20/17 1113     07/19/17 2000  cefTRIAXone (ROCEPHIN) 2 g in sodium chloride 0.9 % 100 mL IVPB  Status:  Discontinued     2 g 200 mL/hr over 30 Minutes Intravenous Every 24 hours 07/19/17 1635 07/20/17 1113   07/19/17 1800  vancomycin (VANCOCIN) 1,500 mg in sodium chloride 0.9 % 500 mL IVPB     1,500 mg 250 mL/hr over 120 Minutes Intravenous  Once 07/19/17 1710 07/19/17 2245   07/19/17 1645  rifampin (RIFADIN) capsule 300 mg     300 mg Oral Every 8 hours 07/19/17 1636     07/18/17 2000  cefTRIAXone (ROCEPHIN) 1 g in sodium chloride 0.9 % 100 mL IVPB  Status:  Discontinued     1 g 200 mL/hr over 30 Minutes Intravenous Every 24 hours 07/18/17 0901 07/19/17 1635   07/18/17 1000  azithromycin (ZITHROMAX) tablet 500 mg  Status:  Discontinued     500 mg Oral Daily 07/17/17 1328 07/19/17 1635   07/16/17 2000  cefTRIAXone (ROCEPHIN) 1 g in dextrose 5 % 50 mL IVPB  Status:  Discontinued     1 g 100 mL/hr over 30 Minutes Intravenous Every 24 hours 07/15/17 2204 07/18/17 0902   07/16/17 2000  azithromycin (ZITHROMAX) 500 mg in dextrose 5 % 250 mL IVPB  Status:  Discontinued     500 mg 250 mL/hr over 60 Minutes Intravenous Every 24 hours 07/15/17 2204 07/17/17 1328   07/15/17 1945  cefTRIAXone (ROCEPHIN) 1 g in dextrose 5 % 50 mL IVPB     1 g 100 mL/hr over 30 Minutes Intravenous  Once 07/15/17 1934 07/15/17 2100   07/15/17 1945  azithromycin (ZITHROMAX) 500 mg in dextrose 5 % 250 mL IVPB     500 mg 250 mL/hr over 60 Minutes Intravenous  Once 07/15/17 1934  07/15/17 2121     Medications: Scheduled Meds: . brimonidine  1 drop Both Eyes Daily   And  . timolol  1 drop Both Eyes Daily  . budesonide  0.25 mg Nebulization BID  . diltiazem  120 mg Oral Daily  . dorzolamide-timolol  1 drop Both Eyes BID  . enoxaparin (LOVENOX) injection  40 mg Subcutaneous Q24H  . ferrous sulfate  325 mg Oral Q breakfast  . memantine  10 mg Oral BID  . metoprolol tartrate  50 mg Oral BID  . pantoprazole  40 mg Oral BID AC  . QUEtiapine  25 mg Oral QHS  . rifampin  300 mg Oral Q8H  . vitamin E  400 Units Oral Daily   Continuous Infusions: . cefTRIAXone (ROCEPHIN)  IV Stopped (07/20/17 1623)  . vancomycin Stopped (07/20/17 1751)   PRN Meds:.acetaminophen **OR** acetaminophen, fluticasone, guaiFENesin, ipratropium-albuterol, nitroGLYCERIN, ondansetron **OR** ondansetron (ZOFRAN) IV  Objective: Weight change: 1.6 oz (0.045 kg)  Intake/Output Summary (Last 24 hours) at 07/21/2017 0847 Last data filed at 07/21/2017 0659 Gross per 24 hour  Intake 1020 ml  Output 1300 ml  Net -280 ml   Blood pressure (!) 125/51, pulse 91, temperature 99.1 F (37.3 C), temperature source Oral, resp. rate 20, height 5\' 6"  (1.676 m), weight 159 lb 3.2 oz (72.2 kg), SpO2 98 %. Temp:  [98.3 F (36.8 C)-99.1 F (37.3 C)] 99.1 F (37.3 C) (02/14 0505) Pulse Rate:  [76-93] 91 (02/14 0505) Resp:  [18-20] 20 (02/14 0505) BP: (119-158)/(51-76) 125/51 (02/14 0505) SpO2:  [97 %-100 %] 98 % (02/14 0505) Weight:  [159 lb 3.2 oz (72.2 kg)] 159 lb 3.2 oz (72.2 kg) (02/14 0351)  Physical Exam: General: Alert and awake, oriented x3, not in any acute distress. HEENT: Anicteric sclera, pupils reactive to light and accommodation, EOMI CVS: RRR, no murmur rubs or gallops Chest: Clear to auscultation bilaterally, no wheezing, rales or rhonchi Abdomen: Soft nontender, nondistended, normal bowel sounds Extremities: No clubbing or edema noted bilaterally Skin: No rashes Lymph: No new  lymphadenopathy Neuro: Nonfocal  CBC: @LABBLAST3 (wbc3,Hgb:3,Hct:3,Plt:3,INR:3APTT:3)@  BMET Recent Labs    07/20/17 0523 07/21/17 0558  NA 138 139  K 3.6 3.9  CL 106 107  CO2 21* 21*  GLUCOSE 85 90  BUN 8 5*  CREATININE 1.01* 0.98  CALCIUM 7.9* 8.4*   Liver Panel  Recent Labs    07/21/17 0558  PROT 6.7  ALBUMIN 2.6*  AST 17  ALT 12*  ALKPHOS 65  BILITOT 1.4*   Sedimentation Rate Recent Labs    07/20/17 0523  ESRSEDRATE 100*   C-Reactive Protein Recent Labs    07/20/17 0523  CRP 5.3*   Micro Results: Recent Results (from the past 720 hour(s))  Culture, blood (routine x 2)     Status: None   Collection Time: 07/15/17  6:30 PM  Result Value Ref Range Status   Specimen Description BLOOD LEFT HAND  Final   Special Requests   Final    BOTTLES DRAWN AEROBIC AND ANAEROBIC Blood Culture adequate volume   Culture   Final    NO GROWTH 5 DAYS Performed at [facility], [address], [place], [place] [address]    Report Status 07/20/2017 FINAL  Final  Culture, blood (routine x 2)     Status: None   Collection Time: 07/15/17  8:10 PM  Result Value Ref Range Status   Specimen Description BLOOD BLOOD RIGHT HAND  Final   Special Requests IN PEDIATRIC BOTTLE Blood Culture adequate volume  Final   Culture   Final    NO GROWTH 5 DAYS Performed at [facility], [place] [address] [place], [place] [address]    Report Status 07/20/2017 FINAL  Final  Culture, sputum-assessment     Status: None   Collection Time: 07/16/17 11:21 AM  Result Value Ref Range Status   Specimen Description SPUTUM  Final   Special Requests NONE  Final   Sputum evaluation   Final    Sputum specimen not acceptable for testing.  Please recollect.   Gram Stain Report Called to,Read Back By and Verified With: L MCELWEE,RN AT [id number] 07/16/17 BY L BENFIELD Performed at [facility], [place] [address], [place], [place] [address]    Report Status 07/16/2017 FINAL  Final    Studies/Results: No results found.  Assessment/Plan:  INTERVAL HISTORY:  - Afebrile over the interval  - Blood cultures from 2/8 with no growth to date   ASSESSMENT: Jasmine [name] is a 84 y.o. female with a prosthetic mitral valve replacement, sick sinus syndrome s/p pacemaker placement, and CKD Stage  III who presented with endocarditis of her prosthetic mitral valve and a vegetation on her pacemaker leads. Not a candidate for surgical intervention per cardiology.   - Continue Vancomycin, Rifampin, and Ceftriaxone for 6 weeks from sterile cultures (2/8) - Primary has called Dr. Lawana Chambers to evaluate the patient's oral hygiene.     LOS: 6 days   [facility] 07/21/2017, 8:47 AM

## 2017-07-21 NOTE — Progress Notes (Signed)
Patient requested to use purewick (external female cathter), RN applied device. Patient complained of burning pain about an hour later so RN removed purewick, helped patient with peri care. Will continue to monitor.

## 2017-07-22 DIAGNOSIS — T826XXA Infection and inflammatory reaction due to cardiac valve prosthesis, initial encounter: Secondary | ICD-10-CM

## 2017-07-22 LAB — URINALYSIS, ROUTINE W REFLEX MICROSCOPIC
Bilirubin Urine: NEGATIVE
Glucose, UA: NEGATIVE mg/dL
HGB URINE DIPSTICK: NEGATIVE
Ketones, ur: NEGATIVE mg/dL
Leukocytes, UA: NEGATIVE
Nitrite: NEGATIVE
Protein, ur: NEGATIVE mg/dL
SPECIFIC GRAVITY, URINE: 1.004 — AB (ref 1.005–1.030)
pH: 5 (ref 5.0–8.0)

## 2017-07-22 LAB — PROTIME-INR
INR: 1.3
Prothrombin Time: 16 seconds — ABNORMAL HIGH (ref 11.4–15.2)

## 2017-07-22 MED ORDER — FERROUS SULFATE 325 (65 FE) MG PO TABS
325.0000 mg | ORAL_TABLET | Freq: Every day | ORAL | Status: DC
  Administered 2017-07-23 – 2017-07-26 (×3): 325 mg via ORAL
  Filled 2017-07-22 (×3): qty 1

## 2017-07-22 NOTE — Progress Notes (Signed)
ANTIBIOTIC CONSULT NOTE   Pharmacy Consult for Vanco Indication: Endocarditis  Allergies  Allergen Reactions  . Cymbalta [Duloxetine Hcl] Diarrhea and Other (See Comments)    weakness  . Dexilant [Dexlansoprazole] Diarrhea and Other (See Comments)    Bloating also    Patient Measurements: Height: 5\' 6"  (167.6 cm) Weight: 157 lb 6.5 oz (71.4 kg) IBW/kg (Calculated) : 59.3 Adjusted Body Weight:    Vital Signs: Temp: 98.2 F (36.8 C) (02/15 0411) Temp Source: Oral (02/15 0411) BP: 132/53 (02/15 0411) Pulse Rate: 75 (02/15 0411) Intake/Output from previous day: 02/14 0701 - 02/15 0700 In: 1140 [P.O.:840; IV Piggyback:300] Out: 2600 [Urine:2600] Intake/Output from this shift: Total I/O In: 390 [P.O.:390] Out: 100 [Urine:100]  Labs: Recent Labs    07/20/17 0523 07/21/17 0558  WBC 12.5* 12.5*  HGB 8.8* 9.4*  PLT 399 436*  CREATININE 1.01* 0.98   Estimated Creatinine Clearance: 38.6 mL/min (by C-G formula based on SCr of 0.98 mg/dL). No results for input(s): VANCOTROUGH, VANCOPEAK, VANCORANDOM, GENTTROUGH, GENTPEAK, GENTRANDOM, TOBRATROUGH, TOBRAPEAK, TOBRARND, AMIKACINPEAK, AMIKACINTROU, AMIKACIN in the last 72 hours.   Microbiology:   Medical History: Past Medical History:  Diagnosis Date  . Aortic insufficiency    a. 06/2014 Echo: Mild AI.  Marland Kitchen Asthma   . Atrial fibrillation and flutter    a. atrial fibrillation s/p DCCV 06-2012;  b. now in chronic atrial fibrillation off anticoagulation due to GI bleed.  . Bradycardia    a. 10/2011 s/p SJM [facility] DR DC PPM, ser #: [id number].  Marland Kitchen Chronic kidney disease (CKD), stage III (moderate) (HCC)   . Dementia   . Diverticulosis of colon with hemorrhage 08/12/2012  . Essential hypertension   . GERD (gastroesophageal reflux disease)   . Glaucoma   . History of GI Bleed    a. prevents use of [place].  Marland Kitchen History of Severe Mitral Regurgitation    a. s/p MVR with pericardial tissue valve;  b. 06/2014 Mild Mitral Stenosis by echo.  Marland Kitchen  HOCM (hypertrophic obstructive cardiomyopathy) ([place])    a. s/p septal myomectomy;  b. 06/2014 Echo: EF 50-55%, mild antsept HK, mild MR, mild MS/MR (bioprosth valve), mildly dil LA, mod TR, PASP 9mmHg.  Marland Kitchen Hyperlipidemia   . Insomnia   . Non-obstructive coronary artery disease    a. 06/2014 NSTEMI/Cath: LM nl, LAD nl, LCX nl, RCA 10-57m, EF 45%.  . Pulmonary HTN ([place])    a. mild to moderate (PASP 15mmHg by echo 06/2014).  . Reflux esophagitis     Assessment:  ID: vanc/rifampin for MV endocarditis. WBC 12.5. Afebrile. Renal ok. -TEE showed endocarditis with infection of the bioprosthetic mitral valve and vegetation on the pacemaker lead. -  There is evidence of endocarditis pacemaker lead involvement, but she is not a good candidate for lead extraction. She might require lifelong oral antibiotic suppression even after completing the IV antibiotics.  Azithromycin 2/11 >> 2/12 Ceftriaxone 2/11 >>  Rifampin 2/12 >> Vancomycin 2/12 >>  2/8 WER:[id number] 2/9 resp: negative  Goal of Therapy:  Vancomycin trough level 15-20 mcg/ml  Plan:  Vanc  1G Q24h (level Sat/Sun) Rocephin 2gm IV q24h OPAT orders placed w/ stop date 08/29/2017.  Nuchem Grattan S. Alford Highland, PharmD, BCPS Clinical Staff Pharmacist Pager [phone]  Eilene Ghazi Stillinger  07/22/2017,1:41 PM

## 2017-07-22 NOTE — Plan of Care (Signed)
  Health Behavior/Discharge Planning: Ability to manage health-related needs will improve 07/22/2017 0000 - Progressing by Tristan Schroeder, RN   Clinical Measurements: Ability to maintain clinical measurements within normal limits will improve 07/22/2017 0000 - Progressing by Tristan Schroeder, RN   Clinical Measurements: Will remain free from infection 07/22/2017 0000 - Progressing by Tristan Schroeder, RN

## 2017-07-22 NOTE — Progress Notes (Signed)
Progress Note  Patient Name: Jasmine [name] Date of Encounter: 07/22/2017  Primary Cardiologist: Dr Radford Pax  Subjective   No SOB, no nausea- "I feel good"  Inpatient Medications    Scheduled Meds: . brimonidine  1 drop Both Eyes Daily   And  . timolol  1 drop Both Eyes Daily  . budesonide  0.25 mg Nebulization BID  . diltiazem  120 mg Oral Daily  . dorzolamide-timolol  1 drop Both Eyes BID  . enoxaparin (LOVENOX) injection  40 mg Subcutaneous Q24H  . ferrous sulfate  325 mg Oral Q breakfast  . furosemide  20 mg Oral Daily  . memantine  10 mg Oral BID  . metoprolol tartrate  50 mg Oral BID  . pantoprazole  40 mg Oral BID AC  . QUEtiapine  25 mg Oral QHS  . rifampin  300 mg Oral Q8H  . vitamin E  400 Units Oral Daily   Continuous Infusions: . cefTRIAXone (ROCEPHIN)  IV Stopped (07/21/17 1745)  . vancomycin Stopped (07/21/17 2151)   PRN Meds: acetaminophen **OR** acetaminophen, fluticasone, guaiFENesin, ipratropium-albuterol, nitroGLYCERIN, ondansetron **OR** ondansetron (ZOFRAN) IV, sodium chloride flush   Vital Signs    Vitals:   07/21/17 1126 07/21/17 2047 07/21/17 2104 07/22/17 0411  BP: 125/67  (!) 155/73 (!) 132/53  Pulse: 85 91 95 75  Resp:  16 18 18   Temp: 98.4 F (36.9 C)  98 F (36.7 C) 98.2 F (36.8 C)  TempSrc: Oral  Oral Oral  SpO2: 100% 94% 93% 97%  Weight:    157 lb 6.5 oz (71.4 kg)  Height:        Intake/Output Summary (Last 24 hours) at 07/22/2017 0758 Last data filed at 07/22/2017 4431 Gross per 24 hour  Intake 1140 ml  Output 2600 ml  Net -1460 ml   Filed Weights   07/20/17 0557 07/21/17 0351 07/22/17 0411  Weight: 159 lb 1.6 oz (72.2 kg) 159 lb 3.2 oz (72.2 kg) 157 lb 6.5 oz (71.4 kg)    Telemetry    AFib, intermittent V pacing - Personally Reviewed  ECG    No new tracing - Personally Reviewed  Physical Exam  Comfortable, eating breakfast GEN: No acute distress.   HEENT: poor dentition Neck: No JVD Cardiac: irregular,  3/6 apical holosystolic murmur, no diastolic murmurs, rubs, or gallops.  Respiratory: basilar crackles GI: Soft, nontender, non-distended  MS: No edema; No deformity. Neuro:  Nonfocal  Psych: Normal affect   Labs    Chemistry Recent Labs  Lab 07/15/17 1826  07/19/17 0453 07/20/17 0523 07/21/17 0558  NA 136   < > 139 138 139  K 4.1   < > 3.9 3.6 3.9  CL 103   < > 105 106 107  CO2 19*   < > 21* 21* 21*  GLUCOSE 97   < > 91 85 90  BUN 13   < > 8 8 5*  CREATININE 1.08*   < > 1.10* 1.01* 0.98  CALCIUM 8.7*   < > 8.4* 7.9* 8.4*  PROT 7.3  --   --   --  6.7  ALBUMIN 2.9*  --   --   --  2.6*  AST 25  --   --   --  17  ALT 18  --   --   --  12*  ALKPHOS 74  --   --   --  65  BILITOT 0.9  --   --   --  1.4*  GFRNONAA 44*   < > 43* 48* 49*  GFRAA 51*   < > 50* 55* 57*  ANIONGAP 14   < > 13 11 11    < > = values in this interval not displayed.     Hematology Recent Labs  Lab 07/19/17 0453 07/20/17 0523 07/21/17 0558  WBC 11.8* 12.5* 12.5*  RBC 3.31* 3.18* 3.41*  HGB 9.2* 8.8* 9.4*  HCT 29.5* 28.5* 30.2*  MCV 89.1 89.6 88.6  MCH 27.8 27.7 27.6  MCHC 31.2 30.9 31.1  RDW 14.6 14.6 14.7  PLT 396 399 436*    Cardiac Enzymes Recent Labs  Lab 07/15/17 1826 07/15/17 2209 07/16/17 0429 07/16/17 0943  TROPONINI 0.05* 0.03* 0.03* 0.03*   No results for input(s): TROPIPOC in the last 168 hours.   BNP Recent Labs  Lab 07/15/17 1826  BNP 366.4*     DDimer No results for input(s): DDIMER in the last 168 hours.   Radiology    Dg Orthopantogram  Result Date: 07/21/2017 CLINICAL DATA:  84 year old female with endocarditis EXAM: ORTHOPANTOGRAM/PANORAMIC COMPARISON:  None. FINDINGS: The patient is nearly edentulous. The maxillary central and lateral incisors as well as the bicuspids are present. No significant periodontal disease. The right lateral incisor is carious. Partial opacification of the right maxillary sinus. IMPRESSION: 1. Partial opacification of the right  maxillary sinus. Acute or chronic sinusitis is not excluded. 2. Carious right maxillary lateral incisor. No evidence of significant periodontal disease. Electronically Signed   By: Jacqulynn Cadet M.D.   On: 07/21/2017 09:34   Dg Chest Port 1 View  Result Date: 07/21/2017 CLINICAL DATA:  Right PICC line insertion. EXAM: PORTABLE CHEST 1 VIEW COMPARISON:  07/18/2017. FINDINGS: PICC line noted with tip over superior vena cava. Cardiac pacer noted with lead tips in the right atrium right ventricle. Prior median sternotomy. Cardiomegaly with bilateral interstitial prominence and bilateral pleural effusions. Findings consistent CHF. Surgical clips left abdomen. IMPRESSION: 1.  PICC line noted with tip over superior vena cava. 2. Cardiac pacer noted with lead tips over the right atrium right ventricle. Prior median sternotomy. Cardiomegaly with mild bilateral interstitial prominence of small pleural effusions suggesting mild CHF. Electronically Signed   By: Marcello Moores  Register   On: 07/21/2017 13:54    Cardiac Studies   TEE 07/19/2017  - Left ventricle: The cavity size was normal. There was moderate   concentric hypertrophy. Systolic function was normal. The   estimated ejection fraction was in the range of 60% to 65%. - Aortic valve: No evidence of vegetation. There was mild   regurgitation. - Mitral valve: A bioprosthesis was present. The sewing ring was   partially dehisced, but the valve remains well seated, without   rocking. There was a 3-4 mm gap between the mitral annulus and   the prosthetic ring, over a roughly 8-10 mm long arc at the   posterior aspect of the ring. No vegetations are seen on the   prosthetic leaflets. Wispy echogenic structures along the atrial   surface of the sewing ring likely represent sewing material, not   vegetations. There was mild regurgitation directed centrally.   There was moderate perivalvular regurgitation. - Left atrium: The atrium was moderately to  severely dilated. No   evidence of thrombus in the atrial cavity or appendage. There was   mildcontinuous spontaneous echo contrast (&quot;smoke&quot;) in the   appendage. - Right atrium: A 6-7 mm mobile mass was attached to the atrial   portion of the  ventricular pacing lead. This is consistent with a   vegetation. The atrium was dilated. - Atrial septum: No defect or patent foramen ovale was identified. - Tricuspid valve: No evidence of vegetation. - Pulmonic valve: No evidence of vegetation. - Line: Two venous pacing wires were visualized in the right atrial   cavity and right ventricular cavity, attached to the right atrial   appendage and RV apex, respectively.  Impressions:  - Findings are consistent with infective endocarditis, with   moderate periprosthetic mitral insufficiency and a vegetation on   the ventricular pacemaker leads.  Echo 07/17/17 Study Conclusions  - Left ventricle: The cavity size was normal. There was moderate concentric hypertrophy. Systolic function was normal. The estimated ejection fraction was in the range of 60% to 65%. Wall motion was normal; there were no regional wall motion abnormalities. - Aortic valve: There was mild to moderate regurgitation directed centrally in the LVOT. - Mitral valve: A bioprosthesis was present. The prosthesis appears well seated, without rocking motion. There is at least moderate mitral insufficiency, most of which appears to be perivalvular, at the posterior aspect of the prosthetic annulus. - Left atrium: The atrium was moderately to severely dilated. - Right atrium: The atrium was mildly dilated. - Tricuspid valve: There was moderate regurgitation directed centrally. - Pulmonary arteries: Systolic pressure was moderately increased. PA peak pressure: 60 mm Hg (S).  Recommendations:  1. Consider transesophageal echocardiography to evaluate mitral perivalvular regurgitation. If the  patient has fever or leukocytosis, blood cultures are appropriate. 2. Due to technical problems, the previous images could not be reviewed, but periannular mitral insufficiency was not previously reported.   Left Heart Cath and Coronary Angiography10/2016  Conclusion   1. Angiographically normal CAD with very tortuous vessels. 2. The left ventricular systolic function is normal. 3. Normal LVEDP       Patient Profile     84 y.o. female with history of mitral regurgitation and hypertrophic cardiomyopathy, status post septal myomectomy and mitral valve replacement with 27 mm bioprosthesis in 2007, normal coronary arteries by angiography 2016, permanent atrial fibrillation with slow ventricular response status post pacemaker implantation ([place] DR 2013, not pacemaker dependent) presents with acute heart failure exacerbation in the setting of protracted febrile illness, quickly responsive to antibiotic therapy, with echo showing evidence of perivalvular mitral insufficiency, raising suspicion for bioprosthetic endocarditis.  Not receiving anticoagulation due to previous GI bleeding.  04/11/2006 1. Left ventricular septal myomectomy for idiopathic hypertrophic subaortic stenosis. 2. Mitral valve replacement with a 27-mm Biocor pericardial valve, model number B10-18M, serial number [id number].  Assessment & Plan    1. MV prosthetic endocarditis:  ID following. She will need protracted course of intravenous antibiotics.  Unfortunately she was already receiving oral antibiotics on admission and her blood cultures may be sterile.  She might require lifelong oral antibiotic suppression even after completing the IV antibiotics.  2.  Prosthetic mitral valve regurgitation: This is moderate and she has responded well to medical therapy. Surgery is not imminently necessary and regardless, she is by no means a candidate for redo heart surgery.  There might be an  option for device closure of a periprosthetic leak, but this would have to be performed in a referral center. Meanwhile managed medically with diuretics and vasodilators.  3.  Permanent atrial fibrillation: Not on anticoagulants due to history of serious GI bleeding  4.  Acute chronic diastolic heart failure: primarily due to new mitral insufficiency, on background of hypertrophic cardiomyopathy with diastolic  dysfunction. Low dose Lasix resumed 07/21/17.  She appears to be clinically euvolemic. Wgt down 10 lbs from 07/15/17, at her baseline wgt now.    5.  Permanent pacemaker: Dual-chamber device programmed VVIR for atrial fibrillation.  There is evidence of endocarditis pacemaker lead involvement, but she is not a good candidate for lead extraction. Best bet is lifelong antibiotic suppression.  6. Poor dentition: she is scheduled to have dental extraction Monday.     Plan:  BMP in AM  Signed, Kerin Ransom, PA-C  07/22/2017, 7:58 AM     I have seen and examined the patient along with L. Kilroy, PA.  I have reviewed the chart, notes and new data.  I agree with PA/NP's note.  Key new complaints: Feels well, denies dyspnea.  No longer nauseous Key examination changes: Irregular rhythm, holosystolic apical murmur radiating to axilla.  No diastolic rumble.  No gallop. Key new findings / data: Appreciate evaluation by Dr. Enrique Sack  PLAN: Continue intravenous antibiotics.  Dental extractions Monday.  Reevaluate degree of mitral insufficiency by transthoracic echo in a couple of weeks.  Low-dose of daily diuretic, try to keep weight under 160 pounds.  Sanda Klein, MD, [place] [phone] 07/22/2017, 8:16 AM

## 2017-07-22 NOTE — Progress Notes (Signed)
PROGRESS NOTE    Jasmine [name]  UYQ:[id number] DOB: [birth year] DOA: 07/15/2017 PCP: [name], MD   Chief Complaint  Patient presents with  . Weakness  . Cough    Brief Narrative:   84 year old female, idiopathic hypertrophic subaortic stenosis  cardiomyopathy,  persistent atrial fib flutter status post [place] pacemaker placement 4/13 not on anticoagulation secondary to suspected diverticular bleed (Dr. Mann)-cardioversion 2014, mitral valve replacement -- cardiac cath 03/2015 with widely patent coronary, SBO 03/2015 Hyperglycemia  Admitted for Acute respiratory failure secondary to pneumonia and CHF.  Breathing appears to be improving.  Patient found to have leak on echocardiogram, cardiology consulted, suspecting possible endocarditis.  TEE performed on 2/12 showing suspected culture-negative endocarditis Assessment & Plan   Multifactorial acute respiratory failure secondary to pneumonia CHF-stabilizing and improved continue duo nebs-on lasix 20 daily  Sepsis secondary to community-acquired pneumonia-influenza PCR was negative, placed on azithromycin and ceftriaxone and other lab work strep pneumo and Legionella [-]  Culture neg bacterial endocarditis based on TEE 2/12-defer to infectious disease and cardiology planning--Started on Vanc/ceftriaxone and Rifampin  Per Dr. Enrique Sack extractions of multiple teeth--scheduled 6/38  Chronic diastolic CHF,-Echocardiogram 2/12 showed EF of 60-65%, no regional wall motion abnormalities Appears to be off low-dose IV Lasix, strict I/O shows -1.6 cc weight 71 kg down from 74 on admit  Chronic atrial fibrillation Chads score >4 slight elevated ~ 110 today-on Cardizem 120 daily,  metoprolol 50 twice daily, does not appear to be on Coumadin secondary to GI bleed  Pulmonary hypertension PA peak pressure 60 mmHg  History of hypertrophic obstructive cardiomyopathy Status post myomectomy-see above-stable  Chronic anemia Hemoglobin  currently 9.4 and stable 8-9 range  Chronic kidney disease, stage III Creatinine currently appears stable, 1.10, continue to monitor BMP  Depression Continue Seroquel 25 hs and Namenda 10  DVT Prophylaxis Lovenox  Code Status: Full  Family Communication: no family available today at bedside--will update as mor eis know regarding ental procedures etc  Disposition Plan: Admitted.  Await Dentl input  Consultants Cardiology  Infectious dx  Antibiotics  azithro 2/8-2/12 ceftriax 2/12 VAncomycin 2/12 Rifampin 2/13   Procedures  Echocardiogram  TeE 2/12  - Findings are consistent with infective endocarditis, with   moderate periprosthetic mitral insufficiency and a vegetation on   the ventricular pacemaker leads.  Antibiotics    Subjective:   Fair  No n/v Eating No cp-no n-no fever-no chills  Objective:   Vitals:   07/21/17 2047 07/21/17 2104 07/22/17 0411 07/22/17 0921  BP:  (!) 155/73 (!) 132/53   Pulse: 91 95 75   Resp: 16 18 18    Temp:  98 F (36.7 C) 98.2 F (36.8 C)   TempSrc:  Oral Oral   SpO2: 94% 93% 97% 94%  Weight:   71.4 kg (157 lb 6.5 oz)   Height:        Intake/Output Summary (Last 24 hours) at 07/22/2017 1508 Last data filed at 07/22/2017 1000 Gross per 24 hour  Intake 1170 ml  Output 2400 ml  Net -1230 ml   Filed Weights   07/20/17 0557 07/21/17 0351 07/22/17 0411  Weight: 72.2 kg (159 lb 1.6 oz) 72.2 kg (159 lb 3.2 oz) 71.4 kg (157 lb 6.5 oz)   Exam  Edentulous with poor dentition--Partial denture in lower HSM and M across precodium best head in LUSE Slight jvd no bruit No LE edema Neuro intact moving extremities x 4 no focal deficit  Data Reviewed: I have personally reviewed following  labs and imaging studies  CBC: Recent Labs  Lab 07/15/17 1826  07/17/17 0620 07/18/17 0534 07/19/17 0453 07/20/17 0523 07/21/17 0558  WBC 22.3*   < > 13.0* 11.8* 11.8* 12.5* 12.5*  NEUTROABS 19.7*  --   --   --   --   --  9.2*  HGB  9.5*   < > 9.5* 9.2* 9.2* 8.8* 9.4*  HCT 30.1*   < > 30.0* 28.9* 29.5* 28.5* 30.2*  MCV 88.8   < > 88.0 87.6 89.1 89.6 88.6  PLT 359   < > 369 387 396 399 436*   < > = values in this interval not displayed.   Basic Metabolic Panel: Recent Labs  Lab 07/15/17 2209  07/17/17 0620 07/18/17 0534 07/18/17 1122 07/19/17 0453 07/20/17 0523 07/21/17 0558  NA  --    < > 135 137  --  139 138 139  K  --    < > 3.3* 3.4*  --  3.9 3.6 3.9  CL  --    < > 102 104  --  105 106 107  CO2  --    < > 21* 22  --  21* 21* 21*  GLUCOSE  --    < > 102* 101*  --  91 85 90  BUN  --    < > 11 8  --  8 8 5*  CREATININE  --    < > 1.10* 1.03*  --  1.10* 1.01* 0.98  CALCIUM  --    < > 8.3* 8.3*  --  8.4* 7.9* 8.4*  MG 1.9  --   --   --  2.1  --   --  2.2   < > = values in this interval not displayed.   GFR: Estimated Creatinine Clearance: 38.6 mL/min (by C-G formula based on SCr of 0.98 mg/dL). Liver Function Tests: Recent Labs  Lab 07/15/17 1826 07/21/17 0558  AST 25 17  ALT 18 12*  ALKPHOS 74 65  BILITOT 0.9 1.4*  PROT 7.3 6.7  ALBUMIN 2.9* 2.6*   No results for input(s): LIPASE, AMYLASE in the last 168 hours. No results for input(s): AMMONIA in the last 168 hours. Coagulation Profile: Recent Labs  Lab 07/18/17 0534 07/19/17 0453 07/20/17 0523 07/21/17 0558 07/22/17 0340  INR 1.30 1.20 1.25 1.28 1.30   Cardiac Enzymes: Recent Labs  Lab 07/15/17 1826 07/15/17 2209 07/16/17 0429 07/16/17 0943  TROPONINI 0.05* 0.03* 0.03* 0.03*    Urine analysis:    Component Value Date/Time   COLORURINE AMBER (A) 07/22/2017 0242   APPEARANCEUR CLEAR 07/22/2017 0242   LABSPEC 1.004 (L) 07/22/2017 0242   PHURINE 5.0 07/22/2017 0242   GLUCOSEU NEGATIVE 07/22/2017 0242   HGBUR NEGATIVE 07/22/2017 0242   BILIRUBINUR NEGATIVE 07/22/2017 0242   KETONESUR NEGATIVE 07/22/2017 0242   PROTEINUR NEGATIVE 07/22/2017 0242   UROBILINOGEN 1.0 03/27/2015 2058   NITRITE NEGATIVE 07/22/2017 0242    LEUKOCYTESUR NEGATIVE 07/22/2017 0242   Sepsis Labs:   Radiology Studies: Dg Orthopantogram  Result Date: 07/21/2017 CLINICAL DATA:  84 year old female with endocarditis EXAM: ORTHOPANTOGRAM/PANORAMIC COMPARISON:  None. FINDINGS: The patient is nearly edentulous. The maxillary central and lateral incisors as well as the bicuspids are present. No significant periodontal disease. The right lateral incisor is carious. Partial opacification of the right maxillary sinus. IMPRESSION: 1. Partial opacification of the right maxillary sinus. Acute or chronic sinusitis is not excluded. 2. Carious right maxillary lateral incisor. No evidence of significant periodontal  disease. Electronically Signed   By: Jacqulynn Cadet M.D.   On: 07/21/2017 09:34   Dg Chest Port 1 View  Result Date: 07/21/2017 CLINICAL DATA:  Right PICC line insertion. EXAM: PORTABLE CHEST 1 VIEW COMPARISON:  07/18/2017. FINDINGS: PICC line noted with tip over superior vena cava. Cardiac pacer noted with lead tips in the right atrium right ventricle. Prior median sternotomy. Cardiomegaly with bilateral interstitial prominence and bilateral pleural effusions. Findings consistent CHF. Surgical clips left abdomen. IMPRESSION: 1.  PICC line noted with tip over superior vena cava. 2. Cardiac pacer noted with lead tips over the right atrium right ventricle. Prior median sternotomy. Cardiomegaly with mild bilateral interstitial prominence of small pleural effusions suggesting mild CHF. Electronically Signed   By: Marcello Moores  Register   On: 07/21/2017 13:54     Scheduled Meds: . brimonidine  1 drop Both Eyes Daily   And  . timolol  1 drop Both Eyes Daily  . budesonide  0.25 mg Nebulization BID  . diltiazem  120 mg Oral Daily  . dorzolamide-timolol  1 drop Both Eyes BID  . enoxaparin (LOVENOX) injection  40 mg Subcutaneous Q24H  . ferrous sulfate  325 mg Oral Q breakfast  . furosemide  20 mg Oral Daily  . memantine  10 mg Oral BID  . metoprolol  tartrate  50 mg Oral BID  . pantoprazole  40 mg Oral BID AC  . QUEtiapine  25 mg Oral QHS  . rifampin  300 mg Oral Q8H  . vitamin E  400 Units Oral Daily   Continuous Infusions: . cefTRIAXone (ROCEPHIN)  IV Stopped (07/21/17 1745)  . vancomycin Stopped (07/21/17 2151)     LOS: 7 days   15 min  Verneita Griffes, MD [facility] Office  [phone]

## 2017-07-22 NOTE — Care Management Note (Signed)
Case Management Note  Patient Details  Name: GRIER CZERWINSKI MRN: [id number] Date of Birth: August 11, 1924  Subjective/Objective:     Endocarditis             Action/Plan: Patient lives at home with daughter; PCP: [name], MD; has private insurance with [facility] Medicare with prescription drug coverage; pharmacy of choice is [facility]; patient will go home on IV antibiotic for 6 weeks; Paul choice offered, she chose [facility]; Dan with [facility] called for arrangements; Carolynn Sayers RN with [facility] is aware and has started teaching with the family; DME - patient reports none at this time; CM will continue to follow for progression of care.       Expected Discharge Date:     Possibly 07/27/2017             Expected Discharge Plan:  [place]  Discharge planning Services  CM Consult  Post Acute Care Choice:    Choice offered to:  Patient  HH Arranged:  RN [facility] Agency:  [place]  Status of Service:  In process, will continue to follow  Sherrilyn Rist [phone] 07/22/2017, 1:01 PM

## 2017-07-23 DIAGNOSIS — T827XXD Infection and inflammatory reaction due to other cardiac and vascular devices, implants and grafts, subsequent encounter: Secondary | ICD-10-CM

## 2017-07-23 LAB — BASIC METABOLIC PANEL
Anion gap: 10 (ref 5–15)
BUN: 5 mg/dL — ABNORMAL LOW (ref 6–20)
CO2: 25 mmol/L (ref 22–32)
Calcium: 8 mg/dL — ABNORMAL LOW (ref 8.9–10.3)
Chloride: 101 mmol/L (ref 101–111)
Creatinine, Ser: 1 mg/dL (ref 0.44–1.00)
GFR calc Af Amer: 56 mL/min — ABNORMAL LOW (ref >60)
GFR calc non Af Amer: 48 mL/min — ABNORMAL LOW (ref >60)
Glucose, Bld: 86 mg/dL (ref 65–99)
Potassium: 3.1 mmol/L — ABNORMAL LOW (ref 3.5–5.1)
Sodium: 136 mmol/L (ref 135–145)

## 2017-07-23 MED ORDER — POTASSIUM CHLORIDE CRYS ER 20 MEQ PO TBCR
40.0000 meq | EXTENDED_RELEASE_TABLET | Freq: Once | ORAL | Status: AC
  Administered 2017-07-23: 40 meq via ORAL
  Filled 2017-07-23: qty 2

## 2017-07-23 NOTE — Progress Notes (Signed)
PROGRESS NOTE    Jasmine [name]  TIR:[id number] DOB: 03-24-1925 DOA: 07/15/2017 PCP: [name], MD   Chief Complaint  Patient presents with  . Weakness  . Cough    Brief Narrative:   84 year old female, idiopathic hypertrophic subaortic stenosis  cardiomyopathy,  persistent atrial fib flutter status post [place] pacemaker placement 4/13 not on anticoagulation secondary to suspected diverticular bleed (Dr. Mann)-cardioversion 2014, mitral valve replacement -- cardiac cath 03/2015 with widely patent coronary, SBO 03/2015 Hyperglycemia  Admitted for Acute respiratory failure secondary to pneumonia and CHF.  Breathing appears to be improving.  Patient found to have leak on echocardiogram, cardiology consulted, suspecting possible endocarditis.  TEE performed on 2/12 showing suspected culture-negative endocarditis Assessment & Plan   Multifactorial acute respiratory failure secondary to pneumonia CHF-stabilizing and improved continue duo nebs-on lasix 20 daily  Sepsis secondary to community-acquired pneumonia-influenza PCR was negative, placed on azithromycin and ceftriaxone and other lab work strep pneumo and Legionella [-]  Culture neg bacterial endocarditis based on TEE 2/12-was on Levaquin -Started on Vanc/ceftriaxone and Rifampin to continue a 6-week course Per Dr. Enrique Sack extractions of multiple teeth--scheduled 6/76  Chronic diastolic CHF,-Echocardiogram 2/12 showed EF of 60-65%, no regional wall motion abnormalities strict I/O shows -1.8 cc weight 70 kg down from 74 on admit  Chronic atrial fibrillation Chads score >4 on Cardizem 120 daily,  metoprolol 50 twice daily, does not appear to be on Coumadin secondary to GI bleed  Pulmonary hypertension PA peak pressure 60 mmHg  History of hypertrophic obstructive cardiomyopathy Status post myomectomy-see above-stable  Chronic anemia Hemoglobin currently 9.4 and stable 8-9 range  Chronic kidney disease, stage  III Creatinine currently appears stable, 1.10, continue to monitor BMP  Depression Continue Seroquel 25 hs and Namenda 10  DVT Prophylaxis Lovenox  Code Status: Full  Family Communication:   Disposition Plan: Admitted.  Await Dentl input  Consultants Cardiology  Infectious dx  Antibiotics  azithro 2/8-2/12 ceftriax 2/12 VAncomycin 2/12 Rifampin 2/13   Procedures  Echocardiogram  TeE 2/12  - Findings are consistent with infective endocarditis, with   moderate periprosthetic mitral insufficiency and a vegetation on   the ventricular pacemaker leads.  Antibiotics    Subjective:   Awake alert in nad Walking fair the room in no distress no fever no chills Had some nausea earlier this morning but no actual vomit and this was after taking medications on an empty stomach  Objective:   Vitals:   07/22/17 0921 07/22/17 2017 07/22/17 2029 07/23/17 0440  BP:  (!) 149/74  109/66  Pulse:  82  86  Resp:  18  18  Temp:  98.1 F (36.7 C)  98.7 F (37.1 C)  TempSrc:  Oral  Oral  SpO2: 94% 97% 95% 97%  Weight:    70.9 kg (156 lb 6.4 oz)  Height:        Intake/Output Summary (Last 24 hours) at 07/23/2017 0752 Last data filed at 07/23/2017 0400 Gross per 24 hour  Intake 630 ml  Output 1000 ml  Net -370 ml   Filed Weights   07/21/17 0351 07/22/17 0411 07/23/17 0440  Weight: 72.2 kg (159 lb 3.2 oz) 71.4 kg (157 lb 6.5 oz) 70.9 kg (156 lb 6.4 oz)   Exam General ocular movements intact Abdomen soft nontender no rebound no guarding hypopigmented keloid scars on chest wall chest is clinically clear Neurologically intact Poor dentition   Data Reviewed: I have personally reviewed following labs and imaging studies  CBC: Recent  Labs  Lab 07/17/17 0620 07/18/17 0534 07/19/17 0453 07/20/17 0523 07/21/17 0558  WBC 13.0* 11.8* 11.8* 12.5* 12.5*  NEUTROABS  --   --   --   --  9.2*  HGB 9.5* 9.2* 9.2* 8.8* 9.4*  HCT 30.0* 28.9* 29.5* 28.5* 30.2*  MCV 88.0 87.6  89.1 89.6 88.6  PLT 369 387 396 399 220*   Basic Metabolic Panel: Recent Labs  Lab 07/18/17 0534 07/18/17 1122 07/19/17 0453 07/20/17 0523 07/21/17 0558 07/23/17 0411  NA 137  --  139 138 139 136  K 3.4*  --  3.9 3.6 3.9 3.1*  CL 104  --  105 106 107 101  CO2 22  --  21* 21* 21* 25  GLUCOSE 101*  --  91 85 90 86  BUN 8  --  8 8 5* 5*  CREATININE 1.03*  --  1.10* 1.01* 0.98 1.00  CALCIUM 8.3*  --  8.4* 7.9* 8.4* 8.0*  MG  --  2.1  --   --  2.2  --    GFR: Estimated Creatinine Clearance: 35 mL/min (by C-G formula based on SCr of 1 mg/dL). Liver Function Tests: Recent Labs  Lab 07/21/17 0558  AST 17  ALT 12*  ALKPHOS 65  BILITOT 1.4*  PROT 6.7  ALBUMIN 2.6*   No results for input(s): LIPASE, AMYLASE in the last 168 hours. No results for input(s): AMMONIA in the last 168 hours. Coagulation Profile: Recent Labs  Lab 07/18/17 0534 07/19/17 0453 07/20/17 0523 07/21/17 0558 07/22/17 0340  INR 1.30 1.20 1.25 1.28 1.30   Cardiac Enzymes: Recent Labs  Lab 07/16/17 0943  TROPONINI 0.03*    Urine analysis:    Component Value Date/Time   COLORURINE AMBER (A) 07/22/2017 0242   APPEARANCEUR CLEAR 07/22/2017 0242   LABSPEC 1.004 (L) 07/22/2017 0242   PHURINE 5.0 07/22/2017 0242   GLUCOSEU NEGATIVE 07/22/2017 0242   HGBUR NEGATIVE 07/22/2017 0242   BILIRUBINUR NEGATIVE 07/22/2017 0242   KETONESUR NEGATIVE 07/22/2017 0242   PROTEINUR NEGATIVE 07/22/2017 0242   UROBILINOGEN 1.0 03/27/2015 2058   NITRITE NEGATIVE 07/22/2017 0242   LEUKOCYTESUR NEGATIVE 07/22/2017 0242   Sepsis Labs:   Radiology Studies: Dg Orthopantogram  Result Date: 07/21/2017 CLINICAL DATA:  84 year old female with endocarditis EXAM: ORTHOPANTOGRAM/PANORAMIC COMPARISON:  None. FINDINGS: The patient is nearly edentulous. The maxillary central and lateral incisors as well as the bicuspids are present. No significant periodontal disease. The right lateral incisor is carious. Partial  opacification of the right maxillary sinus. IMPRESSION: 1. Partial opacification of the right maxillary sinus. Acute or chronic sinusitis is not excluded. 2. Carious right maxillary lateral incisor. No evidence of significant periodontal disease. Electronically Signed   By: Jacqulynn Cadet M.D.   On: 07/21/2017 09:34   Dg Chest Port 1 View  Result Date: 07/21/2017 CLINICAL DATA:  Right PICC line insertion. EXAM: PORTABLE CHEST 1 VIEW COMPARISON:  07/18/2017. FINDINGS: PICC line noted with tip over superior vena cava. Cardiac pacer noted with lead tips in the right atrium right ventricle. Prior median sternotomy. Cardiomegaly with bilateral interstitial prominence and bilateral pleural effusions. Findings consistent CHF. Surgical clips left abdomen. IMPRESSION: 1.  PICC line noted with tip over superior vena cava. 2. Cardiac pacer noted with lead tips over the right atrium right ventricle. Prior median sternotomy. Cardiomegaly with mild bilateral interstitial prominence of small pleural effusions suggesting mild CHF. Electronically Signed   By: Marcello Moores  Register   On: 07/21/2017 13:54     Scheduled  Meds: . brimonidine  1 drop Both Eyes Daily   And  . timolol  1 drop Both Eyes Daily  . budesonide  0.25 mg Nebulization BID  . diltiazem  120 mg Oral Daily  . dorzolamide-timolol  1 drop Both Eyes BID  . enoxaparin (LOVENOX) injection  40 mg Subcutaneous Q24H  . ferrous sulfate  325 mg Oral Q breakfast  . furosemide  20 mg Oral Daily  . memantine  10 mg Oral BID  . metoprolol tartrate  50 mg Oral BID  . pantoprazole  40 mg Oral BID AC  . QUEtiapine  25 mg Oral QHS  . rifampin  300 mg Oral Q8H  . vitamin E  400 Units Oral Daily   Continuous Infusions: . cefTRIAXone (ROCEPHIN)  IV Stopped (07/22/17 1630)  . vancomycin 1,000 mg (07/22/17 1600)     LOS: 8 days   15 min  Verneita Griffes, MD [facility] Office  [phone]

## 2017-07-23 NOTE — Progress Notes (Signed)
Weight down 1 lb - on oral lasix. BP improved. Plan for dental extraction on Monday with Dr. Enrique Sack. No plans for pacer extraction. HR reasonably controlled with a-fib. Pacer reprogrammed. No further suggestions at this time. Will be available over the weekend as needed.  Pixie Casino, MD, [facility], [place] Director of the [facility] Diplomate of the American Board of Clinical Lipidology Attending Cardiologist  Direct Dial: [phone]  Fax: [phone]  Website:  [email or web address]

## 2017-07-24 LAB — VANCOMYCIN, TROUGH: Vancomycin Tr: 15 ug/mL (ref 15–20)

## 2017-07-24 MED ORDER — ENOXAPARIN SODIUM 40 MG/0.4ML ~~LOC~~ SOLN: 40.0000 mg | SUBCUTANEOUS | Status: DC

## 2017-07-24 MED ORDER — SUCRALFATE 1 G PO TABS
1.0000 g | ORAL_TABLET | Freq: Three times a day (TID) | ORAL | Status: DC
  Administered 2017-07-24 – 2017-07-26 (×5): 1 g via ORAL
  Filled 2017-07-24 (×6): qty 1

## 2017-07-24 MED ORDER — POTASSIUM CHLORIDE CRYS ER 20 MEQ PO TBCR
40.0000 meq | EXTENDED_RELEASE_TABLET | Freq: Every day | ORAL | Status: DC
  Administered 2017-07-24 – 2017-07-26 (×2): 40 meq via ORAL
  Filled 2017-07-24 (×2): qty 2

## 2017-07-24 MED ORDER — CELECOXIB 200 MG PO CAPS
200.0000 mg | ORAL_CAPSULE | ORAL | Status: AC
  Administered 2017-07-25: 200 mg via ORAL
  Filled 2017-07-24: qty 1

## 2017-07-24 NOTE — Progress Notes (Signed)
ANTIBIOTIC CONSULT NOTE   Pharmacy Consult for Vanco Indication: Endocarditis  Allergies  Allergen Reactions  . Cymbalta [Duloxetine Hcl] Diarrhea and Other (See Comments)    weakness  . Dexilant [Dexlansoprazole] Diarrhea and Other (See Comments)    Bloating also    Patient Measurements: Height: 5\' 6"  (167.6 cm) Weight: 156 lb 8 oz (71 kg) IBW/kg (Calculated) : 59.3  Vital Signs: Temp: 98.5 F (36.9 C) (02/17 1152) Temp Source: Oral (02/17 1152) BP: 130/63 (02/17 1152) Pulse Rate: 75 (02/17 1152) Intake/Output from previous day: 02/16 0701 - 02/17 0700 In: 1200 [P.O.:600; IV Piggyback:600] Out: 300 [Urine:300] Intake/Output from this shift: Total I/O In: 420 [P.O.:420] Out: 1201 [Urine:1200; Stool:1]  Labs: Recent Labs    07/23/17 0411  CREATININE 1.00   Estimated Creatinine Clearance: 35 mL/min (by C-G formula based on SCr of 1 mg/dL). Recent Labs    07/24/17 1509  [place] 15     Medical History: Past Medical History:  Diagnosis Date  . Aortic insufficiency    a. 06/2014 Echo: Mild AI.  Marland Kitchen Asthma   . Atrial fibrillation and flutter    a. atrial fibrillation s/p DCCV 06-2012;  b. now in chronic atrial fibrillation off anticoagulation due to GI bleed.  . Bradycardia    a. 10/2011 s/p SJM [facility] DR DC PPM, ser #: [id number].  Marland Kitchen Chronic kidney disease (CKD), stage III (moderate) (HCC)   . Dementia   . Diverticulosis of colon with hemorrhage 08/12/2012  . Essential hypertension   . GERD (gastroesophageal reflux disease)   . Glaucoma   . History of GI Bleed    a. prevents use of [place].  Marland Kitchen History of Severe Mitral Regurgitation    a. s/p MVR with pericardial tissue valve;  b. 06/2014 Mild Mitral Stenosis by echo.  Marland Kitchen HOCM (hypertrophic obstructive cardiomyopathy) ([place])    a. s/p septal myomectomy;  b. 06/2014 Echo: EF 50-55%, mild antsept HK, mild MR, mild MS/MR (bioprosth valve), mildly dil LA, mod TR, PASP 28mmHg.  Marland Kitchen Hyperlipidemia   . Insomnia   .  Non-obstructive coronary artery disease    a. 06/2014 NSTEMI/Cath: LM nl, LAD nl, LCX nl, RCA 10-26m, EF 45%.  . Pulmonary HTN ([place])    a. mild to moderate (PASP 24mmHg by echo 06/2014).  . Reflux esophagitis     Assessment: Antibiotics started on vanc/rifampin for MV endocarditis. TEE showed endocarditis with infection of the bioprosthetic mitral valve and vegetation on the pacemaker lead - not a good candidate for lead extraction.   Last WBC is 12.5, has been afebrile. Most recent blood cultures are no growth. Vanc trough this evening ~23 hours after last dose came back at 15. Renal function has been stable (Scr ~1).   Azithromycin 2/11 >> 2/12 Ceftriaxone 2/11 >>  Rifampin 2/12 >> Vancomycin 2/12 >>  2/8 [id number] 2/9 resp: negative  Goal of Therapy:  Vancomycin trough level 15-20 mcg/ml  Plan:  Continue vancomycin 1 gram every 24 hours  Rocephin 2gm IV q24h Rifampin 300 mg every 8 hours OPAT orders placed w/ stop date 08/29/2017.  Doylene Canard, PharmD Clinical Pharmacist  Pager: [phone] Clinical Phone for 07/24/2017 until 3:30pm: [phone] If after 3:30pm, please call main pharmacy at [phone] 07/24/2017,4:25 PM

## 2017-07-24 NOTE — Anesthesia Preprocedure Evaluation (Addendum)
Anesthesia Evaluation  Patient identified by MRN, date of birth, ID band Patient awake    Reviewed: Allergy & Precautions, H&P , NPO status , Patient's Chart, lab work & pertinent test results, reviewed documented beta blocker date and time   History of Anesthesia Complications Negative for: history of anesthetic complications  Airway Mallampati: II  TM Distance: >3 FB Neck ROM: Full    Dental  (+) Partial Lower, Upper Dentures, Poor Dentition   Pulmonary asthma , former smoker,    breath sounds clear to auscultation       Cardiovascular hypertension, Pt. on medications and Pt. on home beta blockers + angina at rest and with exertion + CAD  + dysrhythmias Atrial Fibrillation + pacemaker + Valvular Problems/Murmurs AS and AI  Rhythm:Irregular  EKG - Afib, LBBB  '19 TEE - Moderate concentric LVH. EF was in the range of 60% to 65%. Mild AI. Bioprosthetic MV with mild central MR and moderate perivalvular MR. LA was moderately to severely dilated. A 6-7 mm mobile mass was attached to the atrial portion of the ventricular pacing lead in RA. RA was dilated.   IHSS   Neuro/Psych Dementianegative neurological ROS     GI/Hepatic GERD  Medicated and Controlled,Diverticulosis   Endo/Other  negative endocrine ROS  Renal/GU CRFRenal disease     Musculoskeletal negative musculoskeletal ROS (+)   Abdominal Normal abdominal exam  (+)   Peds  Hematology  (+) anemia ,   Anesthesia Other Findings   Reproductive/Obstetrics                            Anesthesia Physical  Anesthesia Plan  ASA: IV  Anesthesia Plan: General   Post-op Pain Management:    Induction: Intravenous  PONV Risk Score and Plan: 3 and Treatment may vary due to age or medical condition, Ondansetron and Propofol infusion  Airway Management Planned: Oral ETT  Additional Equipment: None  Intra-op Plan: Utilization Of Total Body  Hypothermia per surgeon request  Post-operative Plan: Extubation in OR  Informed Consent: I have reviewed the patients History and Physical, chart, labs and discussed the procedure including the risks, benefits and alternatives for the proposed anesthesia with the patient or authorized representative who has indicated his/her understanding and acceptance.   Dental advisory given  Plan Discussed with: CRNA and Surgeon  Anesthesia Plan Comments:       Anesthesia Quick Evaluation

## 2017-07-24 NOTE — Progress Notes (Signed)
PROGRESS NOTE    Jasmine [name]  VHQ:[id number] DOB: 28-Sep-1924 DOA: 07/15/2017 PCP: [name], MD   Chief Complaint  Patient presents with  . Weakness  . Cough    Brief Narrative:   84 year old female, idiopathic hypertrophic subaortic stenosis  cardiomyopathy,  persistent atrial fib flutter status post [place] pacemaker placement 4/13 not on anticoagulation secondary to suspected diverticular bleed (Dr. Mann)-cardioversion 2014, mitral valve replacement -- cardiac cath 03/2015 with widely patent coronary, SBO 03/2015 Hyperglycemia  Admitted for Acute respiratory failure secondary to pneumonia and CHF.  Breathing appears to be improving.  Patient found to have leak on echocardiogram, cardiology consulted, suspecting possible endocarditis.  TEE performed on 2/12 showing suspected culture-negative endocarditis   Assessment & Plan   Multifactorial acute respiratory failure secondary to pneumonia CHF-stabilizing and improved continue duo nebs-on lasix 20 daily  Nausea-on Protonix already starting Carafate 4 times daily and will see how patient does  Sepsis secondary to community-acquired pneumonia-influenza PCR was negative, placed on azithromycin and ceftriaxone and other lab work strep pneumo and Legionella [-]  Culture neg bacterial endocarditis based on TEE 2/12-was on Levaquin -Started on Vanc/ceftriaxone and Rifampin to continue a 6-week course Per Dr. Enrique Sack extractions of multiple teeth--scheduled 2/18   Chronic diastolic CHF,-Echocardiogram 2/12 showed EF of 60-65%, no regional wall motion abnormalities strict I/O shows -2.2 cc weight 71 kg down from 74 on admit  Chronic atrial fibrillation Chads score >4 on Cardizem 120 daily,  metoprolol 50 twice daily, does not appear to be on Coumadin secondary to GI bleed  Pulmonary hypertension PA peak pressure 60 mmHg  History of hypertrophic obstructive cardiomyopathy Status post myomectomy-see above-stable  Chronic  anemia Hemoglobin currently 9.4 and stable 8-9 range  Hypokalemia 3.1 Place with oral potassium 40 mEq recheck labs in a.m. in addition to mag  Chronic kidney disease, stage III Creatinine currently appears stable, 1.0, continue to monitor BMP  Depression Continue Seroquel 25 hs and Namenda 10  DVT Prophylaxis Lovenox  Code Status: Full  Family Communication:   Disposition Plan: Admitted.  Await Dentl input  Consultants Cardiology  Infectious dx  Antibiotics  azithro 2/8-2/12 ceftriax 2/12 VAncomycin 2/12 Rifampin 2/13   Procedures  Echocardiogram  TeE 2/12  - Findings are consistent with infective endocarditis, with   moderate periprosthetic mitral insufficiency and a vegetation on   the ventricular pacemaker leads.  Antibiotics    Subjective:   Still nausea no vomiting but had some more depression now has some lower abdominal discomfort No further diarrhea Aware that is going for dental procedure tomorrow   Objective:   Vitals:   07/23/17 2030 07/23/17 2040 07/24/17 0440 07/24/17 1152  BP:  132/74 130/64 130/63  Pulse:  72 89 75  Resp:  18 18 20   Temp:  98.4 F (36.9 C) 98.6 F (37 C) 98.5 F (36.9 C)  TempSrc:  Oral Oral Oral  SpO2: 95% 98% 100% 94%  Weight:   71 kg (156 lb 8 oz)   Height:        Intake/Output Summary (Last 24 hours) at 07/24/2017 1557 Last data filed at 07/24/2017 1412 Gross per 24 hour  Intake 1140 ml  Output 1501 ml  Net -361 ml   Filed Weights   07/22/17 0411 07/23/17 0440 07/24/17 0440  Weight: 71.4 kg (157 lb 6.5 oz) 70.9 kg (156 lb 6.4 oz) 71 kg (156 lb 8 oz)   Exam General ocular movements intact Abdomen soft nontender no rebound no guarding hypopigmented  keloid scars on chest wall chest is clinically clear Neurologically intact Poor dentition   Data Reviewed: I have personally reviewed following labs and imaging studies  CBC: Recent Labs  Lab 07/18/17 0534 07/19/17 0453 07/20/17 0523 07/21/17 0558   WBC 11.8* 11.8* 12.5* 12.5*  NEUTROABS  --   --   --  9.2*  HGB 9.2* 9.2* 8.8* 9.4*  HCT 28.9* 29.5* 28.5* 30.2*  MCV 87.6 89.1 89.6 88.6  PLT 387 396 399 419*   Basic Metabolic Panel: Recent Labs  Lab 07/18/17 0534 07/18/17 1122 07/19/17 0453 07/20/17 0523 07/21/17 0558 07/23/17 0411  NA 137  --  139 138 139 136  K 3.4*  --  3.9 3.6 3.9 3.1*  CL 104  --  105 106 107 101  CO2 22  --  21* 21* 21* 25  GLUCOSE 101*  --  91 85 90 86  BUN 8  --  8 8 5* 5*  CREATININE 1.03*  --  1.10* 1.01* 0.98 1.00  CALCIUM 8.3*  --  8.4* 7.9* 8.4* 8.0*  MG  --  2.1  --   --  2.2  --    GFR: Estimated Creatinine Clearance: 35 mL/min (by C-G formula based on SCr of 1 mg/dL). Liver Function Tests: Recent Labs  Lab 07/21/17 0558  AST 17  ALT 12*  ALKPHOS 65  BILITOT 1.4*  PROT 6.7  ALBUMIN 2.6*   No results for input(s): LIPASE, AMYLASE in the last 168 hours. No results for input(s): AMMONIA in the last 168 hours. Coagulation Profile: Recent Labs  Lab 07/18/17 0534 07/19/17 0453 07/20/17 0523 07/21/17 0558 07/22/17 0340  INR 1.30 1.20 1.25 1.28 1.30   Cardiac Enzymes: No results for input(s): CKTOTAL, CKMB, CKMBINDEX, TROPONINI in the last 168 hours.  Urine analysis:    Component Value Date/Time   COLORURINE AMBER (A) 07/22/2017 0242   APPEARANCEUR CLEAR 07/22/2017 0242   LABSPEC 1.004 (L) 07/22/2017 0242   PHURINE 5.0 07/22/2017 0242   GLUCOSEU NEGATIVE 07/22/2017 0242   HGBUR NEGATIVE 07/22/2017 0242   BILIRUBINUR NEGATIVE 07/22/2017 0242   KETONESUR NEGATIVE 07/22/2017 0242   PROTEINUR NEGATIVE 07/22/2017 0242   UROBILINOGEN 1.0 03/27/2015 2058   NITRITE NEGATIVE 07/22/2017 0242   LEUKOCYTESUR NEGATIVE 07/22/2017 0242   Sepsis Labs:   Radiology Studies: No results found.   Scheduled Meds: . budesonide  0.25 mg Nebulization BID  . diltiazem  120 mg Oral Daily  . dorzolamide-timolol  1 drop Both Eyes BID  . enoxaparin (LOVENOX) injection  40 mg Subcutaneous  Q24H  . ferrous sulfate  325 mg Oral Q breakfast  . furosemide  20 mg Oral Daily  . memantine  10 mg Oral BID  . metoprolol tartrate  50 mg Oral BID  . pantoprazole  40 mg Oral BID AC  . potassium chloride  40 mEq Oral Daily  . QUEtiapine  25 mg Oral QHS  . rifampin  300 mg Oral Q8H  . sucralfate  1 g Oral TID WC & HS  . vitamin E  400 Units Oral Daily   Continuous Infusions: . cefTRIAXone (ROCEPHIN)  IV Stopped (07/23/17 1617)  . vancomycin Stopped (07/23/17 1717)     LOS: 9 days   15 min  Verneita Griffes, MD [facility] Office  [phone]

## 2017-07-25 ENCOUNTER — Encounter: Payer: Medicare HMO | Admitting: *Deleted

## 2017-07-25 ENCOUNTER — Telehealth: Payer: Self-pay | Admitting: Cardiology

## 2017-07-25 ENCOUNTER — Inpatient Hospital Stay (HOSPITAL_COMMUNITY): Payer: Medicare HMO | Admitting: Anesthesiology

## 2017-07-25 ENCOUNTER — Encounter (HOSPITAL_COMMUNITY)
Admission: EM | Disposition: A | Discharge status: Home Health | Payer: Self-pay | Source: Home / Self Care | Attending: Family Medicine

## 2017-07-25 ENCOUNTER — Encounter (HOSPITAL_COMMUNITY): Payer: Self-pay | Admitting: Anesthesiology

## 2017-07-25 DIAGNOSIS — K0530 Chronic periodontitis, unspecified: Secondary | ICD-10-CM

## 2017-07-25 DIAGNOSIS — K083 Retained dental root: Secondary | ICD-10-CM

## 2017-07-25 DIAGNOSIS — K029 Dental caries, unspecified: Secondary | ICD-10-CM | POA: Diagnosis present

## 2017-07-25 DIAGNOSIS — I058 Other rheumatic mitral valve diseases: Secondary | ICD-10-CM

## 2017-07-25 DIAGNOSIS — K045 Chronic apical periodontitis: Secondary | ICD-10-CM

## 2017-07-25 DIAGNOSIS — I059 Rheumatic mitral valve disease, unspecified: Secondary | ICD-10-CM | POA: Diagnosis not present

## 2017-07-25 DIAGNOSIS — K0889 Other specified disorders of teeth and supporting structures: Secondary | ICD-10-CM

## 2017-07-25 HISTORY — PX: MULTIPLE EXTRACTIONS WITH ALVEOLOPLASTY: SHX5342

## 2017-07-25 LAB — BASIC METABOLIC PANEL
Anion gap: 10 (ref 5–15)
CHLORIDE: 101 mmol/L (ref 101–111)
CO2: 25 mmol/L (ref 22–32)
CREATININE: 1.08 mg/dL — AB (ref 0.44–1.00)
Calcium: 8.2 mg/dL — ABNORMAL LOW (ref 8.9–10.3)
GFR calc Af Amer: 51 mL/min — ABNORMAL LOW (ref >60)
GFR calc non Af Amer: 44 mL/min — ABNORMAL LOW (ref >60)
Glucose, Bld: 98 mg/dL (ref 65–99)
Potassium: 3.7 mmol/L (ref 3.5–5.1)
Sodium: 136 mmol/L (ref 135–145)

## 2017-07-25 LAB — CBC WITH DIFFERENTIAL/PLATELET
Basophils Absolute: 0 10*3/uL (ref 0.0–0.1)
Basophils Relative: 0 %
Eosinophils Absolute: 0.4 10*3/uL (ref 0.0–0.7)
Eosinophils Relative: 3 %
HEMATOCRIT: 32 % — AB (ref 36.0–46.0)
HEMOGLOBIN: 10.1 g/dL — AB (ref 12.0–15.0)
LYMPHS ABS: 2 10*3/uL (ref 0.7–4.0)
LYMPHS PCT: 19 %
MCH: 28.3 pg (ref 26.0–34.0)
MCHC: 31.6 g/dL (ref 30.0–36.0)
MCV: 89.6 fL (ref 78.0–100.0)
MONOS PCT: 11 %
Monocytes Absolute: 1.2 10*3/uL — ABNORMAL HIGH (ref 0.1–1.0)
NEUTROS ABS: 6.9 10*3/uL (ref 1.7–7.7)
NEUTROS PCT: 67 %
Platelets: 436 10*3/uL — ABNORMAL HIGH (ref 150–400)
RBC: 3.57 MIL/uL — ABNORMAL LOW (ref 3.87–5.11)
RDW: 15.2 % (ref 11.5–15.5)
WBC: 10.5 10*3/uL (ref 4.0–10.5)

## 2017-07-25 LAB — MAGNESIUM: Magnesium: 1.9 mg/dL (ref 1.7–2.4)

## 2017-07-25 LAB — SURGICAL PCR SCREEN
MRSA, PCR: NEGATIVE
Staphylococcus aureus: NEGATIVE

## 2017-07-25 SURGERY — MULTIPLE EXTRACTION WITH ALVEOLOPLASTY
Anesthesia: General

## 2017-07-25 MED ORDER — LIDOCAINE-EPINEPHRINE 2 %-1:100000 IJ SOLN
INTRAMUSCULAR | Status: DC | PRN
  Administered 2017-07-25: 3.4 mL via INTRADERMAL

## 2017-07-25 MED ORDER — LIDOCAINE-EPINEPHRINE 2 %-1:100000 IJ SOLN
INTRAMUSCULAR | Status: AC
  Filled 2017-07-25: qty 8.5

## 2017-07-25 MED ORDER — BUPIVACAINE-EPINEPHRINE (PF) 0.5% -1:200000 IJ SOLN
INTRAMUSCULAR | Status: AC
  Filled 2017-07-25: qty 3.6

## 2017-07-25 MED ORDER — LACTATED RINGERS IV SOLN
INTRAVENOUS | Status: DC | PRN
  Administered 2017-07-25: 07:00:00 via INTRAVENOUS

## 2017-07-25 MED ORDER — OXYMETAZOLINE HCL 0.05 % NA SOLN
NASAL | Status: AC
  Filled 2017-07-25: qty 15

## 2017-07-25 MED ORDER — OXYCODONE HCL 5 MG/5ML PO SOLN: 5.0000 mg | Freq: Once | ORAL | Status: DC | PRN

## 2017-07-25 MED ORDER — HYDROCODONE-ACETAMINOPHEN 7.5-325 MG/15ML PO SOLN: 10.0000 mL | Freq: Four times a day (QID) | ORAL | Status: DC | PRN

## 2017-07-25 MED ORDER — FENTANYL CITRATE (PF) 250 MCG/5ML IJ SOLN
INTRAMUSCULAR | Status: AC
  Filled 2017-07-25: qty 5

## 2017-07-25 MED ORDER — OXYCODONE HCL 5 MG PO TABS: 5.0000 mg | ORAL_TABLET | Freq: Once | ORAL | Status: DC | PRN

## 2017-07-25 MED ORDER — ONDANSETRON HCL 4 MG/2ML IJ SOLN
INTRAMUSCULAR | Status: DC | PRN
  Administered 2017-07-25: 4 mg via INTRAVENOUS

## 2017-07-25 MED ORDER — SUGAMMADEX SODIUM 200 MG/2ML IV SOLN
INTRAVENOUS | Status: DC | PRN
  Administered 2017-07-25: 145 mg via INTRAVENOUS

## 2017-07-25 MED ORDER — OXYMETAZOLINE HCL 0.05 % NA SOLN
NASAL | Status: DC | PRN
  Administered 2017-07-25: 1

## 2017-07-25 MED ORDER — LIDOCAINE HCL (CARDIAC) 20 MG/ML IV SOLN
INTRAVENOUS | Status: DC | PRN
  Administered 2017-07-25: 40 mg via INTRAVENOUS

## 2017-07-25 MED ORDER — LACTATED RINGERS IV SOLN
INTRAVENOUS | Status: DC
  Administered 2017-07-25: 10:00:00 via INTRAVENOUS

## 2017-07-25 MED ORDER — PROPOFOL 10 MG/ML IV BOLUS
INTRAVENOUS | Status: DC | PRN
  Administered 2017-07-25: 60 mg via INTRAVENOUS

## 2017-07-25 MED ORDER — LIDOCAINE-EPINEPHRINE 2 %-1:100000 IJ SOLN
INTRAMUSCULAR | Status: AC
  Filled 2017-07-25: qty 1.7

## 2017-07-25 MED ORDER — PROPOFOL 10 MG/ML IV BOLUS
INTRAVENOUS | Status: AC
  Filled 2017-07-25: qty 40

## 2017-07-25 MED ORDER — ROCURONIUM BROMIDE 100 MG/10ML IV SOLN
INTRAVENOUS | Status: DC | PRN
  Administered 2017-07-25: 30 mg via INTRAVENOUS

## 2017-07-25 MED ORDER — 0.9 % SODIUM CHLORIDE (POUR BTL) OPTIME
TOPICAL | Status: DC | PRN
  Administered 2017-07-25: 1000 mL

## 2017-07-25 MED ORDER — ONDANSETRON HCL 4 MG/2ML IJ SOLN: 4.0000 mg | Freq: Once | INTRAMUSCULAR | Status: DC | PRN

## 2017-07-25 MED ORDER — BUPIVACAINE-EPINEPHRINE 0.5% -1:200000 IJ SOLN
INTRAMUSCULAR | Status: DC | PRN
  Administered 2017-07-25: 3.4 mL

## 2017-07-25 MED ORDER — FENTANYL CITRATE (PF) 100 MCG/2ML IJ SOLN
INTRAMUSCULAR | Status: DC | PRN
  Administered 2017-07-25: 50 ug via INTRAVENOUS

## 2017-07-25 MED ORDER — FENTANYL CITRATE (PF) 100 MCG/2ML IJ SOLN: 25.0000 ug | INTRAMUSCULAR | Status: DC | PRN

## 2017-07-25 SURGICAL SUPPLY — 39 items
ALCOHOL 70% 16 OZ (MISCELLANEOUS) ×3 IMPLANT
ATTRACTOMAT 16X20 MAGNETIC DRP (DRAPES) ×3 IMPLANT
BLADE SURG 15 STRL LF DISP TIS (BLADE) ×2 IMPLANT
BLADE SURG 15 STRL SS (BLADE) ×6
COVER SURGICAL LIGHT HANDLE (MISCELLANEOUS) ×3 IMPLANT
GAUZE PACKING FOLDED 2  STR (GAUZE/BANDAGES/DRESSINGS) ×2
GAUZE PACKING FOLDED 2 STR (GAUZE/BANDAGES/DRESSINGS) ×1 IMPLANT
GAUZE SPONGE 4X4 16PLY XRAY LF (GAUZE/BANDAGES/DRESSINGS) ×3 IMPLANT
GLOVE BIO SURGEON STRL SZ 6.5 (GLOVE) ×2 IMPLANT
GLOVE BIO SURGEONS STRL SZ 6.5 (GLOVE) ×1
GLOVE SURG ORTHO 8.0 STRL STRW (GLOVE) ×3 IMPLANT
GOWN STRL REUS W/ TWL LRG LVL3 (GOWN DISPOSABLE) ×1 IMPLANT
GOWN STRL REUS W/TWL 2XL LVL3 (GOWN DISPOSABLE) ×3 IMPLANT
GOWN STRL REUS W/TWL LRG LVL3 (GOWN DISPOSABLE) ×3
HEMOSTAT SURGICEL 2X14 (HEMOSTASIS) ×1 IMPLANT
KIT BASIN OR (CUSTOM PROCEDURE TRAY) ×3 IMPLANT
KIT ROOM TURNOVER OR (KITS) ×3 IMPLANT
MANIFOLD NEPTUNE WASTE (CANNULA) ×3 IMPLANT
NDL BLUNT 16X1.5 OR ONLY (NEEDLE) ×1 IMPLANT
NDL DENTAL 27 LONG (NEEDLE) ×2 IMPLANT
NEEDLE BLUNT 16X1.5 OR ONLY (NEEDLE) ×3 IMPLANT
NEEDLE DENTAL 27 LONG (NEEDLE) ×6 IMPLANT
NS IRRIG 1000ML POUR BTL (IV SOLUTION) ×3 IMPLANT
PACK EENT II TURBAN DRAPE (CUSTOM PROCEDURE TRAY) ×3 IMPLANT
PAD ARMBOARD 7.5X6 YLW CONV (MISCELLANEOUS) ×3 IMPLANT
SPONGE SURGIFOAM ABS GEL 100 (HEMOSTASIS) IMPLANT
SPONGE SURGIFOAM ABS GEL 12-7 (HEMOSTASIS) IMPLANT
SPONGE SURGIFOAM ABS GEL SZ50 (HEMOSTASIS) IMPLANT
SUCTION FRAZIER HANDLE 10FR (MISCELLANEOUS) ×2
SUCTION TUBE FRAZIER 10FR DISP (MISCELLANEOUS) ×1 IMPLANT
SUT CHROMIC 3 0 PS 2 (SUTURE) ×6 IMPLANT
SUT CHROMIC 4 0 P 3 18 (SUTURE) IMPLANT
SYR 50ML SLIP (SYRINGE) ×3 IMPLANT
TOWEL OR 17X26 10 PK STRL BLUE (TOWEL DISPOSABLE) ×3 IMPLANT
TUBE CONNECTING 12'X1/4 (SUCTIONS) ×1
TUBE CONNECTING 12X1/4 (SUCTIONS) ×2 IMPLANT
WATER STERILE IRR 1000ML POUR (IV SOLUTION) ×3 IMPLANT
WATER TABLETS ICX (MISCELLANEOUS) ×3 IMPLANT
YANKAUER SUCT BULB TIP NO VENT (SUCTIONS) ×3 IMPLANT

## 2017-07-25 NOTE — Anesthesia Postprocedure Evaluation (Signed)
Anesthesia Post Note  Patient: Jasmine [name]  Procedure(s) Performed: EXTRACTION OF TOOTH #'S 22-27 WITH ALVEOLOPLASTY (N/A )     Patient location during evaluation: PACU Anesthesia Type: General Level of consciousness: awake and alert Pain management: pain level controlled Vital Signs Assessment: post-procedure vital signs reviewed and stable Respiratory status: spontaneous breathing, nonlabored ventilation, respiratory function stable and patient connected to nasal cannula oxygen Cardiovascular status: blood pressure returned to baseline and stable Postop Assessment: no apparent nausea or vomiting Anesthetic complications: no    Last Vitals:  Vitals:   07/25/17 0900 07/25/17 0908  BP:    Pulse: 86 86  Resp:  20  Temp:  (!) 36.2 C  SpO2:      Last Pain:  Vitals:   07/25/17 0608  TempSrc:   PainSc: 0-No pain                 Audry Pili

## 2017-07-25 NOTE — Progress Notes (Signed)
PROGRESS NOTE    Jasmine [name]  GXQ:[id number] DOB: 21-Feb-1925 DOA: 07/15/2017 PCP: [name], MD   Chief Complaint  Patient presents with  . Weakness  . Cough    Brief Narrative:   84 year old female, idiopathic hypertrophic subaortic stenosis  cardiomyopathy,  persistent atrial fib flutter status post [place] pacemaker placement 4/13 not on anticoagulation secondary to suspected diverticular bleed (Dr. Mann)-cardioversion 2014, mitral valve replacement -- cardiac cath 03/2015 with widely patent coronary, SBO 03/2015 Hyperglycemia  Admitted for Acute respiratory failure secondary to pneumonia and CHF.  Breathing appears to be improving.  Patient found to have leak on echocardiogram, cardiology consulted, suspecting possible endocarditis.  TEE performed on 2/12 showing suspected culture-negative endocarditis   Assessment & Plan   Multifactorial acute respiratory failure secondary to pneumonia CHF-stabilizing and improved continue duo nebs-on lasix 20 daily  Nausea-on Protonix already starting Carafate 4 times daily and will see how patient does  Sepsis secondary to community-acquired pneumonia-influenza PCR was negative, placed on azithromycin and ceftriaxone and other lab work strep pneumo and Legionella [-]  Culture neg bacterial endocarditis based on TEE 2/12-was on Levaquin -Started on Vanc/ceftriaxone and Rifampin to continue a 6-week course Per Dr. Enrique Sack extractions of multiple teeth--performed 2/18 without event, pain control with Lortab syrup and re-evalauate over course of today Family will need to be educated on IV antibiotic use and comfortable with the same so we will plan for discharge 1/44  Chronic diastolic CHF,-Echocardiogram 2/12 showed EF of 60-65%, no regional wall motion abnormalities strict I/O shows - -3.1 cc weight 70 kg down from 74 on admit  Chronic atrial fibrillation Chads score >4 on Cardizem 120 daily,  metoprolol 50 twice daily, does not  appear to be on Coumadin secondary to GI bleed  Pulmonary hypertension PA peak pressure 60 mmHg  History of hypertrophic obstructive cardiomyopathy Status post myomectomy-see above-stable  Chronic anemia Hemoglobin currently 9.4 and stable 10 range  Hypokalemia 3.1 On oral potassium 40 mEq Calcium located 3.7 magnesium 1.9 we will stop checking  Chronic kidney disease, stage III Creatinine currently appears stable, 1.0, continue to monitor BMP  Depression Continue Seroquel 25 hs and Namenda 10  DVT Prophylaxis Lovenox  Code Status: Full  Family Communication:   Disposition Plan: Admitted.  D/c probably 2/19 am  Consultants Cardiology  Infectious dx Dental surgery  Antibiotics  azithro 2/8-2/12 ceftriax 2/12 VAncomycin 2/12 Rifampin 2/13   Procedures  Echocardiogram Dental surgery, 7 teeth extracted performed 2/18    TeE 2/12  - Findings are consistent with infective endocarditis, with   moderate periprosthetic mitral insufficiency and a vegetation on   the ventricular pacemaker leads.  Antibiotics    Subjective:   Seen post-op for ental surgery No real pain Understands post-op diet etc No cp No n/v hasnt eaten today  No fever Multiple familyin room  Objective:   Vitals:   07/25/17 0855 07/25/17 0900 07/25/17 0908 07/25/17 0929  BP: 105/66   129/65  Pulse: 84 86 86 90  Resp: (!) 24  20 18   Temp:   (!) 97.2 F (36.2 C) 98 F (36.7 C)  TempSrc:      SpO2: 94%   97%  Weight:      Height:        Intake/Output Summary (Last 24 hours) at 07/25/2017 1016 Last data filed at 07/25/2017 0900 Gross per 24 hour  Intake 1040 ml  Output 2460 ml  Net -1420 ml   Filed Weights   07/23/17 0440  07/24/17 0440 07/25/17 0347  Weight: 70.9 kg (156 lb 6.4 oz) 71 kg (156 lb 8 oz) 70.5 kg (155 lb 8 oz)   Exam General ocular movements intact Abdomen soft nontender no rebound no guarding hypopigmented keloid scars on chest wall chest is clinically  clear Neurologically intact Poor dentition   Data Reviewed: I have personally reviewed following labs and imaging studies  CBC: Recent Labs  Lab 07/19/17 0453 07/20/17 0523 07/21/17 0558 07/25/17 0357  WBC 11.8* 12.5* 12.5* 10.5  NEUTROABS  --   --  9.2* 6.9  HGB 9.2* 8.8* 9.4* 10.1*  HCT 29.5* 28.5* 30.2* 32.0*  MCV 89.1 89.6 88.6 89.6  PLT 396 399 436* 093*   Basic Metabolic Panel: Recent Labs  Lab 07/18/17 1122 07/19/17 0453 07/20/17 0523 07/21/17 0558 07/23/17 0411 07/25/17 0357  NA  --  139 138 139 136 136  K  --  3.9 3.6 3.9 3.1* 3.7  CL  --  105 106 107 101 101  CO2  --  21* 21* 21* 25 25  GLUCOSE  --  91 85 90 86 98  BUN  --  8 8 5* 5* <5*  CREATININE  --  1.10* 1.01* 0.98 1.00 1.08*  CALCIUM  --  8.4* 7.9* 8.4* 8.0* 8.2*  MG 2.1  --   --  2.2  --  1.9   GFR: Estimated Creatinine Clearance: 32.4 mL/min (A) (by C-G formula based on SCr of 1.08 mg/dL (H)). Liver Function Tests: Recent Labs  Lab 07/21/17 0558  AST 17  ALT 12*  ALKPHOS 65  BILITOT 1.4*  PROT 6.7  ALBUMIN 2.6*   No results for input(s): LIPASE, AMYLASE in the last 168 hours. No results for input(s): AMMONIA in the last 168 hours. Coagulation Profile: Recent Labs  Lab 07/19/17 0453 07/20/17 0523 07/21/17 0558 07/22/17 0340  INR 1.20 1.25 1.28 1.30   Cardiac Enzymes: No results for input(s): CKTOTAL, CKMB, CKMBINDEX, TROPONINI in the last 168 hours.  Urine analysis:    Component Value Date/Time   COLORURINE AMBER (A) 07/22/2017 0242   APPEARANCEUR CLEAR 07/22/2017 0242   LABSPEC 1.004 (L) 07/22/2017 0242   PHURINE 5.0 07/22/2017 0242   GLUCOSEU NEGATIVE 07/22/2017 0242   HGBUR NEGATIVE 07/22/2017 0242   BILIRUBINUR NEGATIVE 07/22/2017 0242   KETONESUR NEGATIVE 07/22/2017 0242   PROTEINUR NEGATIVE 07/22/2017 0242   UROBILINOGEN 1.0 03/27/2015 2058   NITRITE NEGATIVE 07/22/2017 0242   LEUKOCYTESUR NEGATIVE 07/22/2017 0242   Sepsis Labs:   Radiology Studies: No  results found.   Scheduled Meds: . budesonide  0.25 mg Nebulization BID  . diltiazem  120 mg Oral Daily  . dorzolamide-timolol  1 drop Both Eyes BID  . ferrous sulfate  325 mg Oral Q breakfast  . furosemide  20 mg Oral Daily  . memantine  10 mg Oral BID  . metoprolol tartrate  50 mg Oral BID  . pantoprazole  40 mg Oral BID AC  . potassium chloride  40 mEq Oral Daily  . QUEtiapine  25 mg Oral QHS  . rifampin  300 mg Oral Q8H  . sucralfate  1 g Oral TID WC & HS  . vitamin E  400 Units Oral Daily   Continuous Infusions: . cefTRIAXone (ROCEPHIN)  IV Stopped (07/24/17 1636)  . lactated ringers 10 mL/hr at 07/25/17 0955  . vancomycin 1,000 mg (07/24/17 1609)     LOS: 10 days   15 min  Verneita Griffes, MD [facility] [phone]  [place]  [phone]

## 2017-07-25 NOTE — Discharge Instructions (Signed)

## 2017-07-25 NOTE — Progress Notes (Signed)
Progress Note  Patient Name: Jasmine [name] Date of Encounter: 07/25/2017  Primary Cardiologist:   No primary care provider on file.   Subjective   Frail appearing status post dental extraction but without complaints.   Inpatient Medications    Scheduled Meds: . budesonide  0.25 mg Nebulization BID  . diltiazem  120 mg Oral Daily  . dorzolamide-timolol  1 drop Both Eyes BID  . ferrous sulfate  325 mg Oral Q breakfast  . furosemide  20 mg Oral Daily  . memantine  10 mg Oral BID  . metoprolol tartrate  50 mg Oral BID  . pantoprazole  40 mg Oral BID AC  . potassium chloride  40 mEq Oral Daily  . QUEtiapine  25 mg Oral QHS  . rifampin  300 mg Oral Q8H  . sucralfate  1 g Oral TID WC & HS  . vitamin E  400 Units Oral Daily   Continuous Infusions: . cefTRIAXone (ROCEPHIN)  IV Stopped (07/24/17 1636)  . lactated ringers 10 mL/hr at 07/25/17 0955  . vancomycin 1,000 mg (07/24/17 1609)   PRN Meds: acetaminophen **OR** acetaminophen, fluticasone, guaiFENesin, HYDROcodone-acetaminophen, ipratropium-albuterol, nitroGLYCERIN, ondansetron **OR** ondansetron (ZOFRAN) IV, sodium chloride flush   Vital Signs    Vitals:   07/25/17 0855 07/25/17 0900 07/25/17 0908 07/25/17 0929  BP: 105/66   129/65  Pulse: 84 86 86 90  Resp: (!) 24  20 18   Temp:   (!) 97.2 F (36.2 C) 98 F (36.7 C)  TempSrc:      SpO2: 94%   97%  Weight:      Height:        Intake/Output Summary (Last 24 hours) at 07/25/2017 0959 Last data filed at 07/25/2017 0900 Gross per 24 hour  Intake 1040 ml  Output 2460 ml  Net -1420 ml   Filed Weights   07/23/17 0440 07/24/17 0440 07/25/17 0347  Weight: 156 lb 6.4 oz (70.9 kg) 156 lb 8 oz (71 kg) 155 lb 8 oz (70.5 kg)    Telemetry    Atrial fib - Personally Reviewed  ECG    NA - Personally Reviewed  Physical Exam   GEN: No acute distress.   Neck: No  JVD Cardiac: Irregular RR, systolic murmur, no diastolic murmurs, rubs, or gallops.  Respiratory:  Clear  to auscultation bilaterally. GI: Soft, nontender, non-distended  MS: No  edema; No deformity. Neuro:  Nonfocal  Psych: Normal affect   Labs    Chemistry Recent Labs  Lab 07/21/17 0558 07/23/17 0411 07/25/17 0357  NA 139 136 136  K 3.9 3.1* 3.7  CL 107 101 101  CO2 21* 25 25  GLUCOSE 90 86 98  BUN 5* 5* <5*  CREATININE 0.98 1.00 1.08*  CALCIUM 8.4* 8.0* 8.2*  PROT 6.7  --   --   ALBUMIN 2.6*  --   --   AST 17  --   --   ALT 12*  --   --   ALKPHOS 65  --   --   BILITOT 1.4*  --   --   GFRNONAA 49* 48* 44*  GFRAA 57* 56* 51*  ANIONGAP 11 10 10      Hematology Recent Labs  Lab 07/20/17 0523 07/21/17 0558 07/25/17 0357  WBC 12.5* 12.5* 10.5  RBC 3.18* 3.41* 3.57*  HGB 8.8* 9.4* 10.1*  HCT 28.5* 30.2* 32.0*  MCV 89.6 88.6 89.6  MCH 27.7 27.6 28.3  MCHC 30.9 31.1 31.6  RDW 14.6 14.7  15.2  PLT 399 436* 436*    Cardiac EnzymesNo results for input(s): TROPONINI in the last 168 hours. No results for input(s): TROPIPOC in the last 168 hours.   BNPNo results for input(s): BNP, PROBNP in the last 168 hours.   DDimer No results for input(s): DDIMER in the last 168 hours.   Radiology    No results found.  Cardiac Studies   TEE 2/12  Study Conclusions  - Left ventricle: The cavity size was normal. There was moderate   concentric hypertrophy. Systolic function was normal. The   estimated ejection fraction was in the range of 60% to 65%. - Aortic valve: No evidence of vegetation. There was mild   regurgitation. - Mitral valve: A bioprosthesis was present. The sewing ring was   partially dehisced, but the valve remains well seated, without   rocking. There was a 3-4 mm gap between the mitral annulus and   the prosthetic ring, over a roughly 8-10 mm long arc at the   posterior aspect of the ring. This is well seen on 3D imaging. No   vegetations are seen on the prosthetic leaflets. Wispy echogenic   structures along the atrial surface of the sewing ring  likely   represent sewing material, not vegetations. There was mild   regurgitation directed centrally. There was moderate perivalvular   regurgitation. - Left atrium: The atrium was moderately to severely dilated. No   evidence of thrombus in the atrial cavity or appendage. There was   mildcontinuous spontaneous echo contrast (&quot;smoke&quot;) in the   appendage. - Right atrium: A 6-7 mm mobile mass was attached to the atrial   portion of the ventricular pacing lead. This is consistent with a   vegetation. The atrium was dilated. - Atrial septum: No defect or patent foramen ovale was identified. - Tricuspid valve: No evidence of vegetation. - Pulmonic valve: No evidence of vegetation. - Line: Two venous pacing wires were visualized in the right atrial   cavity and right ventricular cavity, attached to the right atrial   appendage and RV apex, respectively.   Patient Profile   84 y.o. female   with history of mitral regurgitation and hypertrophic cardiomyopathy, status post septal myomectomy and mitral valve replacement with 27 mm bioprosthesis in 2007, normal coronary arteries by angiography 2016, permanent atrial fibrillation with slow ventricular response status post pacemaker implantation([employer] DR 2013, not pacemaker dependent)presents with acute heart failure exacerbation in the setting of protracted febrile illness, quickly responsive to antibiotic therapy, with echo showing evidence of perivalvular mitral insufficiency,raising suspicion for bioprosthetic endocarditis.Not receiving anticoagulation due to previous GI bleeding.  Assessment & Plan    MV prosthetic endocarditis: ID following.   Valve as above on TEE.  Not a surgical candidate.   Follow.  In the future there might be a consideration of percutaneous treatment of the perivalvular leak.  However, we would need to see how she is doing clinically and with her advanced age understand goals of care.  This is a  conversation for the future. I would think that conservative therapy with medical management only would be appropriate.   Prosthetic mitral valve regurgitation:    As above.   Permanent atrial fibrillation:   No anticoagulation with history of GI bleeding.   Acute chronic diastolic heart failure:  Euvolemic.  Continue current therapy.    Permanent pacemaker:  Dual-chamber device programmed VVIR for atrial fibrillation.  There are lesions on the pacer leads but she is not  a candidate for extraction.    Poor dentition:   Status post dental extraction today.       For questions or updates, please contact [place] Please consult [email or web address] for contact info under Cardiology/STEMI.   Signed, Minus Breeding, MD  07/25/2017, 9:59 AM

## 2017-07-25 NOTE — Transfer of Care (Signed)
Immediate Anesthesia Transfer of Care Note  Patient: Jasmine [name]  Procedure(s) Performed: EXTRACTION OF TOOTH #'S 22-27 WITH ALVEOLOPLASTY (N/A )  Patient Location: PACU  Anesthesia Type:General  Level of Consciousness: awake, alert , oriented and sedated  Airway & Oxygen Therapy: Patient Spontanous Breathing and Patient connected to face mask oxygen  Post-op Assessment: Report given to RN, Post -op Vital signs reviewed and stable and Patient moving all extremities  Post vital signs: Reviewed and stable  Last Vitals:  Vitals:   07/24/17 2104 07/24/17 2112  BP: 108/72   Pulse: 97   Resp: 16   Temp: 36.9 C   SpO2: 99% 97%    Last Pain:  Vitals:   07/25/17 0608  TempSrc:   PainSc: 0-No pain         Complications: No apparent anesthesia complications

## 2017-07-25 NOTE — Op Note (Signed)
OPERATIVE REPORT  Patient:            Jasmine [name] Date of Birth:  29-May-1925 MRN:                [id number]   DATE OF PROCEDURE:  07/25/2017  PREOPERATIVE DIAGNOSES: 1. Mitral valve endocarditis 2.  Chronic apical periodontitis 3.  Retained root segments 4.  Dental caries 5.  Chronic periodontitis 6.  Loose teeth  POSTOPERATIVE DIAGNOSES: 1. Mitral valve endocarditis 2.  Chronic apical periodontitis 3.  Retained root segments 4.  Dental caries 5.  Chronic periodontitis 6.  Loose teeth  OPERATIONS: 1. Multiple extraction of tooth numbers 22 through 27 2. 2 Quadrants of alveoloplasty   SURGEON: Lenn Cal, DDS  ASSISTANT: Molli Posey (dental assistant)  ANESTHESIA: General anesthesia via oral endotracheal tube.  MEDICATIONS: 1. Ancef 2 g IV previous orders.   2. Vancomycin IV per previous orders 3.  Local anesthesia with a total utilization of 1 carpules each containing 34 mg of lidocaine with 0.017 mg of epinephrine as well as 2 carpules each containing 9 mg of bupivacaine with 0.009 mg of epinephrine.  SPECIMENS: There are 6 teeth that were discarded.  DRAINS: None  CULTURES: None  COMPLICATIONS: None  ESTIMATED BLOOD LOSS: Less than 25 mLs.  INTRAVENOUS FLUIDS: 400 mLs of Lactated ringers solution.  INDICATIONS: The patient was recently diagnosed with mitral valve endocarditis.  A medically necessary dental consultation was then requested to evaluate poor dentition.  The patient was examined and treatment planned for extraction of remaining teeth numbers 22 through 27 with alveoloplasty in the operating room with general anesthesia.  This treatment plan was formulated to decrease the risks and complications associated with dental infection from further affecting the patient's systemic health.  OPERATIVE FINDINGS: Patient was examined operating room number 18.  The teeth were identified for extraction. The patient was noted be affected by  chronic periodontitis, chronic apical periodontitis, dental caries, retained root segments, and loose teeth.   DESCRIPTION OF PROCEDURE: Patient was brought to the main operating room number 18. Patient was then placed in the supine position on the operating table. General anesthesia was then induced per the anesthesia team. The patient was then prepped and draped in the usual manner for dental medicine procedure. A timeout was performed. The patient was identified and procedures were verified. A throat pack was placed at this time. The oral cavity was then thoroughly examined with the findings noted above. The patient was then ready for dental medicine procedure as follows:  Local anesthesia was then administered sequentially with a total utilization of 1 carpules each containing 34 mg of lidocaine with 0.017 mg of epinephrine as well as 2 carpules  each containing 9 mg bupivacaine with 0.009 mg of epinephrine.  The mandibular quadrants were approached. The patient was given bilateral inferior alveolar nerve blocks and long buccal nerve blocks utilizing the bupivacaine with epinephrine. Further infiltration was then achieved utilizing the lidocaine with epinephrine. A 15 blade incision was then made from the distal of number 20 and extended to the distal of #29.  A surgical flap was then carefully reflected.  The lower teeth were then subluxated with a series of straight elevators.  Tooth numbers 22 through 27 were then removed utilizing a 151 forceps. Alveoloplasty was then performed from area #20-29  utilizing a rongeurs and bone file to help achieve primary closure. The tissues were approximated and trimmed appropriately. The surgical sites were then irrigated with  copious amounts of sterile saline. The mandibular left surgical site was then closed from the distal of 20 and extended to the mesial #24 utilizing 3-0 chromic gut suture in a continuous interrupted suture technique x1.  The mandibular right  surgical site was then closed from the distal #29 extended the mesial #25 utilizing 3-0 chromic gut suture in a continuous interrupted suture technique x1.  2 interrupted sutures were then placed to further close the surgical site as needed with 3-0 chromic gut material.  At this point time, the entire mouth was irrigated with copious amounts of sterile saline. The patient was examined for complications, seeing none, the dental medicine procedure was deemed to be complete. The throat pack was removed at this time. An oral airway was then placed at the request of the anesthesia team. A series of 4 x 4 gauze were placed in the mouth to aid hemostasis. The patient was then handed over to the anesthesia team for final disposition. After an appropriate amount of time, the patient was extubated and taken to the postanesthsia care unit in good condition. All counts were correct for the dental medicine procedure.  The patient may be discharged by the medical  team barring any unforeseen complications.   Lenn Cal, DDS.

## 2017-07-25 NOTE — Interval H&P Note (Signed)
Anesthesia H&P Update: History and Physical Exam reviewed; patient is OK for planned anesthetic and procedure. ? ?

## 2017-07-25 NOTE — Anesthesia Procedure Notes (Signed)
Procedure Name: Intubation Date/Time: 07/25/2017 7:46 AM Performed by: Scheryl Darter, CRNA Pre-anesthesia Checklist: Patient identified, Emergency Drugs available, Suction available and Patient being monitored Patient Re-evaluated:Patient Re-evaluated prior to induction Oxygen Delivery Method: Circle System Utilized Preoxygenation: Pre-oxygenation with 100% oxygen Induction Type: IV induction Ventilation: Mask ventilation without difficulty Laryngoscope Size: Mac and 3 Grade View: Grade I Tube type: Oral Tube size: 7.0 mm Number of attempts: 1 Airway Equipment and Method: Stylet and Oral airway Placement Confirmation: ETT inserted through vocal cords under direct vision,  positive ETCO2 and breath sounds checked- equal and bilateral Tube secured with: Tape Dental Injury: Teeth and Oropharynx as per pre-operative assessment

## 2017-07-25 NOTE — Telephone Encounter (Signed)
LMOVM reminding pt to send remote transmission.   

## 2017-07-25 NOTE — Progress Notes (Signed)
Patient went to OR via bed.

## 2017-07-25 NOTE — Progress Notes (Signed)
PRE-OPERATIVE NOTE:  07/25/2017 Jasmine [name] [id number]  VITALS: BP 108/72 (BP Location: Left Arm)   Pulse 97   Temp 98.4 F (36.9 C) (Oral)   Resp 16   Ht 5\' 6"  (1.676 m)   Wt 155 lb 8 oz (70.5 kg) Comment: b scale  SpO2 97%   BMI 25.10 kg/m   Lab Results  Component Value Date   WBC 10.5 07/25/2017   HGB 10.1 (L) 07/25/2017   HCT 32.0 (L) 07/25/2017   MCV 89.6 07/25/2017   PLT 436 (H) 07/25/2017   BMET    Component Value Date/Time   NA 136 07/25/2017 0357   K 3.7 07/25/2017 0357   CL 101 07/25/2017 0357   CO2 25 07/25/2017 0357   GLUCOSE 98 07/25/2017 0357   BUN <5 (L) 07/25/2017 0357   CREATININE 1.08 (H) 07/25/2017 0357   CALCIUM 8.2 (L) 07/25/2017 0357   GFRNONAA 44 (L) 07/25/2017 0357   GFRAA 51 (L) 07/25/2017 0357    Lab Results  Component Value Date   INR 1.30 07/22/2017   INR 1.28 07/21/2017   INR 1.25 07/20/2017   No results found for: PTT   Jasmine [name] presents for multiple dental extractions with alveoloplasty in the operating room with general anesthesia.    SUBJECTIVE: The patient denies any acute medical or dental changes and agrees to proceed with treatment as planned.  EXAM: No sign of acute dental changes.  ASSESSMENT: Patient is affected by chronic apical periodontitis, chronic periodontitis, loose teeth, dental caries, and a retained root segment.Marland Kitchen  PLAN: Patient agrees to proceed with treatment as planned in the operating room as previously discussed and accepts the risks, benefits, and complications of the proposed treatment. Patient is aware of the risk for bleeding, bruising, swelling, infection, pain, nerve damage, soft tissue damage, sinus involvement, root tip fracture, mandible fracture, and the risks of complications associated with the anesthesia. Patient also is aware of the potential for other complications up to and including death due to her overall cardiovascular and respiratory compromise.    Lenn Cal,  DDS

## 2017-07-25 NOTE — H&P (View-Only) (Signed)
PRE-OPERATIVE NOTE:  07/25/2017 Jasmine [name] [id number]  VITALS: BP 108/72 (BP Location: Left Arm)   Pulse 97   Temp 98.4 F (36.9 C) (Oral)   Resp 16   Ht 5\' 6"  (1.676 m)   Wt 155 lb 8 oz (70.5 kg) Comment: b scale  SpO2 97%   BMI 25.10 kg/m   Lab Results  Component Value Date   WBC 10.5 07/25/2017   HGB 10.1 (L) 07/25/2017   HCT 32.0 (L) 07/25/2017   MCV 89.6 07/25/2017   PLT 436 (H) 07/25/2017   BMET    Component Value Date/Time   NA 136 07/25/2017 0357   K 3.7 07/25/2017 0357   CL 101 07/25/2017 0357   CO2 25 07/25/2017 0357   GLUCOSE 98 07/25/2017 0357   BUN <5 (L) 07/25/2017 0357   CREATININE 1.08 (H) 07/25/2017 0357   CALCIUM 8.2 (L) 07/25/2017 0357   GFRNONAA 44 (L) 07/25/2017 0357   GFRAA 51 (L) 07/25/2017 0357    Lab Results  Component Value Date   INR 1.30 07/22/2017   INR 1.28 07/21/2017   INR 1.25 07/20/2017   No results found for: PTT   Jasmine [name] presents for multiple dental extractions with alveoloplasty in the operating room with general anesthesia.    SUBJECTIVE: The patient denies any acute medical or dental changes and agrees to proceed with treatment as planned.  EXAM: No sign of acute dental changes.  ASSESSMENT: Patient is affected by chronic apical periodontitis, chronic periodontitis, loose teeth, dental caries, and a retained root segment.Marland Kitchen  PLAN: Patient agrees to proceed with treatment as planned in the operating room as previously discussed and accepts the risks, benefits, and complications of the proposed treatment. Patient is aware of the risk for bleeding, bruising, swelling, infection, pain, nerve damage, soft tissue damage, sinus involvement, root tip fracture, mandible fracture, and the risks of complications associated with the anesthesia. Patient also is aware of the potential for other complications up to and including death due to her overall cardiovascular and respiratory compromise.    Lenn Cal,  DDS

## 2017-07-26 ENCOUNTER — Encounter (HOSPITAL_COMMUNITY): Payer: Self-pay | Admitting: Dentistry

## 2017-07-26 DIAGNOSIS — K08199 Complete loss of teeth due to other specified cause, unspecified class: Secondary | ICD-10-CM

## 2017-07-26 DIAGNOSIS — K08109 Complete loss of teeth, unspecified cause, unspecified class: Secondary | ICD-10-CM

## 2017-07-26 LAB — CBC WITH DIFFERENTIAL/PLATELET
Basophils Absolute: 0.1 10*3/uL (ref 0.0–0.1)
Basophils Relative: 1 %
EOS PCT: 4 %
Eosinophils Absolute: 0.4 10*3/uL (ref 0.0–0.7)
HCT: 31.3 % — ABNORMAL LOW (ref 36.0–46.0)
HEMOGLOBIN: 9.8 g/dL — AB (ref 12.0–15.0)
LYMPHS ABS: 1.6 10*3/uL (ref 0.7–4.0)
LYMPHS PCT: 17 %
MCH: 28.3 pg (ref 26.0–34.0)
MCHC: 31.3 g/dL (ref 30.0–36.0)
MCV: 90.5 fL (ref 78.0–100.0)
MONOS PCT: 8 %
Monocytes Absolute: 0.8 10*3/uL (ref 0.1–1.0)
Neutro Abs: 6.5 10*3/uL (ref 1.7–7.7)
Neutrophils Relative %: 70 %
PLATELETS: 389 10*3/uL (ref 150–400)
RBC: 3.46 MIL/uL — AB (ref 3.87–5.11)
RDW: 15.2 % (ref 11.5–15.5)
WBC: 9.3 10*3/uL (ref 4.0–10.5)

## 2017-07-26 LAB — RENAL FUNCTION PANEL
Albumin: 2.6 g/dL — ABNORMAL LOW (ref 3.5–5.0)
Anion gap: 12 (ref 5–15)
BUN: <5 mg/dL — ABNORMAL LOW (ref 6–20)
CHLORIDE: 100 mmol/L — AB (ref 101–111)
CO2: 25 mmol/L (ref 22–32)
Calcium: 8.2 mg/dL — ABNORMAL LOW (ref 8.9–10.3)
Creatinine, Ser: 1.1 mg/dL — ABNORMAL HIGH (ref 0.44–1.00)
GFR calc non Af Amer: 43 mL/min — ABNORMAL LOW (ref >60)
GFR, EST AFRICAN AMERICAN: 50 mL/min — AB (ref >60)
Glucose, Bld: 91 mg/dL (ref 65–99)
POTASSIUM: 3.8 mmol/L (ref 3.5–5.1)
Phosphorus: 2.7 mg/dL (ref 2.5–4.6)
Sodium: 137 mmol/L (ref 135–145)

## 2017-07-26 LAB — MAGNESIUM: MAGNESIUM: 1.9 mg/dL (ref 1.7–2.4)

## 2017-07-26 MED ORDER — FUROSEMIDE 20 MG PO TABS: 20.0000 mg | ORAL_TABLET | Freq: Every day | ORAL | 0 refills | Status: AC

## 2017-07-26 MED ORDER — POTASSIUM CHLORIDE CRYS ER 20 MEQ PO TBCR: 40.0000 meq | EXTENDED_RELEASE_TABLET | Freq: Every day | ORAL | 0 refills | Status: AC

## 2017-07-26 MED ORDER — CEFTRIAXONE IV (FOR PTA / DISCHARGE USE ONLY): 2.0000 g | INTRAVENOUS | 0 refills | Status: AC

## 2017-07-26 MED ORDER — VANCOMYCIN IV (FOR PTA / DISCHARGE USE ONLY): 1000.0000 mg | INTRAVENOUS | 0 refills | Status: AC

## 2017-07-26 MED ORDER — SODIUM CHLORIDE 0.9 % IR SOLN
1000.0000 mL | Status: DC
  Administered 2017-07-26 (×3): 1000 mL

## 2017-07-26 MED ORDER — SUCRALFATE 1 G PO TABS: 1.0000 g | ORAL_TABLET | Freq: Three times a day (TID) | ORAL | 0 refills | Status: AC

## 2017-07-26 MED ORDER — VANCOMYCIN HCL IN DEXTROSE 1-5 GM/200ML-% IV SOLN
1000.0000 mg | INTRAVENOUS | Status: DC
  Administered 2017-07-26: 1000 mg via INTRAVENOUS
  Filled 2017-07-26: qty 200

## 2017-07-26 MED ORDER — RIFAMPIN 300 MG PO CAPS: 300.0000 mg | ORAL_CAPSULE | Freq: Three times a day (TID) | ORAL | 1 refills | Status: AC

## 2017-07-26 MED ORDER — HYDROCODONE-ACETAMINOPHEN 7.5-325 MG/15ML PO SOLN: 10.0000 mL | Freq: Four times a day (QID) | ORAL | 0 refills | Status: AC | PRN

## 2017-07-26 MED ORDER — HEPARIN SOD (PORK) LOCK FLUSH 100 UNIT/ML IV SOLN
250.0000 [IU] | INTRAVENOUS | Status: AC | PRN
  Administered 2017-07-26: 250 [IU]

## 2017-07-26 MED ORDER — BOOST / RESOURCE BREEZE PO LIQD CUSTOM
1.0000 | Freq: Three times a day (TID) | ORAL | Status: DC
  Administered 2017-07-26: 1 via ORAL

## 2017-07-26 MED ORDER — SODIUM CHLORIDE 0.9 % IR SOLN: 200.0000 mL | Status: DC

## 2017-07-26 NOTE — Plan of Care (Signed)
  Completed/Met Clinical Measurements: Ability to maintain clinical measurements within normal limits will improve 07/26/2017 1011 - Completed/Met by Alonna Buckler, RN Will remain free from infection 07/26/2017 1011 - Completed/Met by Alonna Buckler, RN Diagnostic test results will improve 07/26/2017 1011 - Completed/Met by Alonna Buckler, RN Respiratory complications will improve 07/26/2017 1011 - Completed/Met by Alonna Buckler, RN Cardiovascular complication will be avoided 07/26/2017 1011 - Completed/Met by Alonna Buckler, RN Elimination: Will not experience complications related to bowel motility 07/26/2017 1011 - Completed/Met by Alonna Buckler, RN Will not experience complications related to urinary retention 07/26/2017 1011 - Completed/Met by Alonna Buckler, RN Pain Managment: General experience of comfort will improve 07/26/2017 1011 - Completed/Met by Alonna Buckler, RN Safety: Ability to remain free from injury will improve 07/26/2017 1011 - Completed/Met by Alonna Buckler, RN Skin Integrity: Risk for impaired skin integrity will decrease 07/26/2017 1011 - Completed/Met by Alonna Buckler, RN

## 2017-07-26 NOTE — Progress Notes (Signed)
Initial Nutrition Assessment  DOCUMENTATION CODES:   Not applicable  INTERVENTION:   -Boost Breeze po TID, each supplement provides 250 kcal and 9 grams of protein -RD will follow for diet advancement and supplement as appropriate  NUTRITION DIAGNOSIS:   Increased nutrient needs related to acute illness as evidenced by estimated needs.  GOAL:   Patient will meet greater than or equal to 90% of their needs  MONITOR:   PO intake, Supplement acceptance, Diet advancement, Labs, Weight trends, Skin, I & O's  REASON FOR ASSESSMENT:   Consult Assessment of nutrition requirement/status  ASSESSMENT:   Jasmine [name] is a 84 y.o. female with history of sick sinus node status post pacemaker placement, chronic kidney disease stage III, chronic anemia, dementia was brought to the ER after patient had persistent shortness of breath.  2/12- s/p TEE which revealed bioprosthetic mitral valve endocarditis and moderate perivalvular mitral insufficiency 2/14- PICC placed 2/18- s/p Multiple extraction of tooth numbers 22 through 27 and 2 quadrants of alveoloplasty   Spoke with pt and son at bedside. Pt reports decline in appetite since hospital admission due to generally feeling unwell. Pt reports she consumes 3 meals per day with occasional snacks in between. Meals consist of meat, starch, and vegetable pt will snack on foods such as ice cream, graham crackers, and chocolate popsicles.   Pt reports tolerating clear liquids well. She states that she will not feel comfortable consuming solid food for at least the next few days. Meal completion 45-65%. Pt lives with her daughter, who will assist with meal preparation at discharge.   Pt reports UBW is between 155-160#. Pt estimates she has lost "a pound or two" during hospitalization. Reviewed wt hx, which reveals wt stability.  Educated pt and son on diet advancement as tolerated. Discussed ways to mechanically alter foods for ease of intake.    Case discussed with RN, who reports likely discharge today.  Labs reviewed.   NUTRITION - FOCUSED PHYSICAL EXAM:    Most Recent Value  Orbital Region  No depletion  Upper Arm Region  Mild depletion  Thoracic and Lumbar Region  No depletion  Buccal Region  No depletion  Temple Region  No depletion  Clavicle Bone Region  No depletion  Clavicle and Acromion Bone Region  No depletion  Scapular Bone Region  No depletion  Dorsal Hand  Mild depletion  Patellar Region  No depletion  Anterior Thigh Region  No depletion  Posterior Calf Region  No depletion  Edema (RD Assessment)  None  Hair  Reviewed  Eyes  Reviewed  Mouth  Reviewed  Skin  Reviewed  Nails  Reviewed       Diet Order:  Diet clear liquid Room service appropriate? No; Fluid consistency: Thin Diet - low sodium heart healthy  EDUCATION NEEDS:   Education needs have been addressed  Skin:  Skin Assessment: Reviewed RN Assessment  Last BM:  07/25/17  Height:   Ht Readings from Last 1 Encounters:  07/15/17 5\' 6"  (1.676 m)    Weight:   Wt Readings from Last 1 Encounters:  07/26/17 155 lb 3.2 oz (70.4 kg)    Ideal Body Weight:  59.1 kg  BMI:  Body mass index is 25.05 kg/m.  Estimated Nutritional Needs:   Kcal:  1550-1750  Protein:  60-75 grams  Fluid:  1.5-1.7 L    Montford Barg A. Jimmye Norman, RD, LDN, CDE Pager: [phone] After hours Pager: [phone]

## 2017-07-26 NOTE — Progress Notes (Signed)
Reviewed all discharge instructions with patient and her daughter and they stated understanding.  They confirmed she has personal belongings of dentures, cell phone and phone charger, and clothing which she brought to hospital.  No voiced complaints.

## 2017-07-26 NOTE — Discharge Summary (Signed)
Physician Discharge Summary  Jasmine [name] WCB:[id number] DOB: 06-26-1924 DOA: 07/15/2017  PCP: [name], MD  Admit date: 07/15/2017 Discharge date: 07/26/2017  Time spent: 35 minutes  Recommendations for Outpatient Follow-up:  1. Will need ongoing antibiotics rifampin ceftriaxone and vancomycin as per infectious disease until 3/25 and then reevaluation possibly with another echocardiogram 2. Recommended replacement of potassium and given replacement of the same with labs as an outpatient was placed on Lasix this admission-because of multifactorial dyspnea and may need further adjustment of dosage as an outpatient 3. Given hydrocodone syrup this admission may need further dosing going forward 4. Not a candidate for Coumadin secondary to GI bleeds in the past 5. Family might be interested in OP goals of care-sugges tdiscussion re the same going forward  Discharge Diagnoses:  Principal Problem:   ICD (implantable cardioverter-defibrillator) infection ([place]) Active Problems:   Chronic atrial fibrillation ([place])   History of mitral valve replacement with bioprosthetic valve   Pulmonary HTN (H[facility])   IHSS (idiopathic hypertrophic subaortic stenosis) (HCC)   Chronic kidney disease (CKD), stage III (moderate) (HCC)   Non-obstructive coronary artery disease   Essential hypertension   Acute respiratory failure with hypoxia ([place])   Community acquired pneumonia   Acute infective endocarditis   SOB (shortness of breath)   Prosthetic valve endocarditis (HCC)   Endocarditis of mitral valve   Acute on chronic diastolic heart failure ([place])   Discharge Condition: Improved  Diet recommendation: Heart healthy low-salt and 1200 cc fluid restriction  Filed Weights   07/24/17 0440 07/25/17 0347 07/26/17 0500  Weight: 71 kg (156 lb 8 oz) 70.5 kg (155 lb 8 oz) 70.4 kg (155 lb 3.2 oz)    History of present illness:  84 year old female, idiopathic hypertrophic subaortic stenosis  cardiomyopathy,   persistent atrial fib flutter status post Saint Jude pacemaker placement 4/13 not on anticoagulation secondary to suspected diverticular bleed (Dr. Mann)-cardioversion 2014, mitral valve replacement -- cardiac cath 03/2015 with widely patent coronary, SBO 03/2015 Hyperglycemia  Admitted for Acute respiratory failure secondary to pneumonia and CHF.  Breathing appears to be improving.  Patient found to have leak on echocardiogram, cardiology consulted, suspecting possible endocarditis.  TEE performed on 2/12 showing suspected culture-negative endocarditis    Hospital Course:  Multifactorial acute respiratory failure secondary to pneumonia CHF-stabilizing and improved continue duo nebs-on lasix 20 daily  Nausea-on Protonix already starting Carafate 4 times daily and will rx a small amount on d/c for patient to be reassessed on d/c  Sepsis secondary to community-acquired pneumonia-influenza PCR was negative, placed on azithromycin and ceftriaxone and other lab work strep pneumo and Legionella [-]  Culture neg bacterial endocarditis based on TEE 2/12-was on Levaquin -Started on Vanc/ceftriaxone and Rifampin to continue a 6-week course Per Dr. Enrique Sack extractions of multiple teeth--performed 2/18 without event, pain control with Lortab syrup and re-evalauate over course of today Family will need to be educated on IV antibiotic use and comfortable with the same so we will plan for discharge 6/16  Chronic diastolic CHF,-Echocardiogram 2/12 showed EF of 60-65%, no regional wall motion abnormalities strict I/O shows - 4.6 cc weight 70 kg down from 74 on admit  Chronic atrial fibrillation Chads score >4 on Cardizem 120 daily,  metoprolol 50 twice daily, does not appear to be on Coumadin secondary to GI bleed  Pulmonary hypertension PA peak pressure 60 mmHg  History of hypertrophic obstructive cardiomyopathy Status post myomectomy-see above-stable  Chronic anemia Hemoglobin currently  9.4 and stable 10 range  Hypokalemia 3.1 On oral potassium 40 mEq Calcium located 3.7 magnesium 1.9 we will stop checking  Chronic kidney disease, stage III Creatinine currently appears stable, 1.0, continue to monitor BMP  Depression Continue Seroquel 25 hs and Namenda 10  Consultants Cardiology  Infectious dx Dental surgery  Antibiotics  azithro 2/8-2/12 ceftriax 2/12 VAncomycin 2/12 Rifampin 2/13   Procedures  Echocardiogram Dental surgery, 7 teeth extracted performed 2/18  TeE 2/12  - Findings are consistent with infective endocarditis, with moderate periprosthetic mitral insufficiency and a vegetation on the ventricular pacemaker leads.   Discharge Exam: Vitals:   07/26/17 0831 07/26/17 0958  BP:  132/62  Pulse:  98  Resp:    Temp:    SpO2: 97%     General: awake alert pleasan tin nad, no distress pain in mouth minimal Cardiovascular: s1 s 2no m/r/g Respiratory: clear Edentulous no bleeding neuro intact without deficit currently  Discharge Instructions    Allergies as of 07/26/2017      Reactions   Cymbalta [duloxetine Hcl] Diarrhea, Other (See Comments)   weakness   Dexilant [dexlansoprazole] Diarrhea, Other (See Comments)   Bloating also      Medication List    STOP taking these medications   levofloxacin 500 MG tablet Commonly known as:  LEVAQUIN     TAKE these medications   acetaminophen 500 MG tablet Commonly known as:  TYLENOL Take 500 mg by mouth every 6 (six) hours as needed. For pain   albuterol 108 (90 Base) MCG/ACT inhaler Commonly known as:  PROVENTIL HFA;VENTOLIN HFA Inhale 2 puffs into the lungs every 4 (four) hours as needed for wheezing or shortness of breath. For shortness of breath   ALIVE WOMENS 50+ PO Take by mouth daily.   budesonide 180 MCG/ACT inhaler Commonly known as:  PULMICORT Inhale 2 puffs into the lungs daily as needed (SOB/Wheezing).   cefTRIAXone IVPB Commonly known as:   ROCEPHIN Inject 2 g into the vein daily. Indication:  endocarditis Last Day of Therapy:  08/29/2017 Labs - Once weekly:  CBC/D and BMP, Labs - Every other week:  ESR and CRP   cholecalciferol 400 units Tabs tablet Commonly known as:  VITAMIN D Take 400 Units by mouth daily.   COMBIGAN 0.2-0.5 % ophthalmic solution Generic drug:  brimonidine-timolol Place 1 drop into both eyes daily.   diltiazem 120 MG 24 hr capsule Commonly known as:  CARDIZEM CD Take 1 capsule (120 mg total) by mouth daily.   dorzolamide-timolol 22.3-6.8 MG/ML ophthalmic solution Commonly known as:  COSOPT Place 1 drop into both eyes 2 (two) times daily.   ferrous sulfate 325 (65 FE) MG tablet Take 1 tablet (325 mg total) by mouth daily with breakfast.   fluticasone 50 MCG/ACT nasal spray Commonly known as:  FLONASE Place 1 spray into both nostrils daily as needed for allergies or rhinitis.   FOLIC ACID-VIT T0-VXB L39 PO Take 1 tablet by mouth daily.   furosemide 20 MG tablet Commonly known as:  LASIX Take 1 tablet (20 mg total) by mouth daily. Start taking on:  07/27/2017   guaiFENesin 600 MG 12 hr tablet Commonly known as:  MUCINEX Take 600 mg by mouth 2 (two) times daily as needed for cough or to loosen phlegm.   HYDROcodone-acetaminophen 7.5-325 mg/15 ml solution Commonly known as:  HYCET Take 10-15 mLs by mouth every 6 (six) hours as needed for moderate pain or severe pain.   hydrocortisone cream 1 % Apply 1 application topically as needed.  memantine 10 MG tablet Commonly known as:  NAMENDA Take 10 mg by mouth 2 (two) times daily.   metoprolol tartrate 50 MG tablet Commonly known as:  LOPRESSOR Take 1 tablet (50 mg total) by mouth 2 (two) times daily.   nitroGLYCERIN 0.4 MG SL tablet Commonly known as:  NITROSTAT Place 1 tablet (0.4 mg total) under the tongue every 5 (five) minutes as needed for chest pain.   ondansetron 4 MG tablet Commonly known as:  ZOFRAN Take 1 tablet (4 mg  total) by mouth every 8 (eight) hours as needed for nausea or vomiting.   pantoprazole 40 MG tablet Commonly known as:  PROTONIX Take 1 tablet (40 mg total) by mouth 2 (two) times daily before a meal.   potassium chloride SA 20 MEQ tablet Commonly known as:  K-DUR,KLOR-CON Take 2 tablets (40 mEq total) by mouth daily. Start taking on:  07/27/2017   QUEtiapine 25 MG tablet Commonly known as:  SEROQUEL Take 25 mg by mouth at bedtime.   rifampin 300 MG capsule Commonly known as:  RIFADIN Take 1 capsule (300 mg total) by mouth every 8 (eight) hours.   sucralfate 1 g tablet Commonly known as:  CARAFATE Take 1 tablet (1 g total) by mouth 4 (four) times daily -  with meals and at bedtime.   vancomycin IVPB Inject 1,000 mg into the vein daily. Indication:  endocarditis Last Day of Therapy:  08/29/17 Labs - _0 /19/19 1117     Allergies  Allergen Reactions  . Cymbalta [Duloxetine Hcl] Diarrhea and Other (See Comments)    weakness  . Dexilant [Dexlansoprazole] Diarrhea and Other (See Comments)    Bloating also   Follow-up Information    [facility] Follow up.   Specialty:  [place] Why:  A home health care nurse will go to your home to assist with your IV antibiotics Contact information: [place] [address] [phone]        Lenn Cal, DDS. Schedule an appointment as soon as possible for a visit on 08/03/2017.   Specialty:  Dentistry Why:  For suture removal in 7-10 days Contact information: [place] [address] [phone]            The results of significant diagnostics from this hospitalization (including imaging, microbiology, ancillary and laboratory) are listed below for reference.    Significant Diagnostic Studies: Dg Orthopantogram  Result Date: 07/21/2017 CLINICAL DATA:  84 year old female with endocarditis EXAM: ORTHOPANTOGRAM/PANORAMIC COMPARISON:  None. FINDINGS: The patient is nearly edentulous. The maxillary central and lateral incisors as well as the bicuspids are present. No significant periodontal disease. The right lateral incisor is carious. Partial opacification of the right maxillary sinus. IMPRESSION: 1. Partial opacification of the right maxillary sinus. Acute or chronic sinusitis is not excluded. 2. Carious right maxillary lateral incisor. No evidence of significant periodontal disease. Electronically Signed   By: Jacqulynn Cadet M.D.   On: 07/21/2017 09:34   Dg Chest 2 View  Result Date: 07/18/2017 CLINICAL DATA:  Shortness of breath, atrial  fibrillation, asthma EXAM: CHEST  2 VIEW COMPARISON:  07/15/2017 FINDINGS: Previous median sternotomy and left subclavian 2 lead pacer noted. Similar pattern of chronic interstitial reticulonodular opacities, worse in the right upper lobe and throughout the left lung. No superimposed collapse, consolidation, or pneumonia. No large effusion or pneumothorax. Trachea is midline. Atherosclerosis of the aorta. Degenerative changes of the spine with a mild scoliosis. IMPRESSION: Similar chronic interstitial lung disease pattern. Difficult to exclude mild volume overload or edema. Electronically Signed   By: Jerilynn Mages.  Shick M.D.   On: 07/18/2017 07:49   Dg Chest 2 View  Result Date: 07/15/2017 CLINICAL DATA:  Cough and shortness of breath since yesterday. EXAM: CHEST  2 VIEW COMPARISON:  07/14/2017 FINDINGS: Previous median sternotomy. Dual lead pacemaker appears the same. There is chronic pulmonary scarring. Interstitial markings do appear more prominent however, suggesting developing interstitial edema. No alveolar edema, collapse or effusion. Bony structures are unremarkable. IMPRESSION: Chronic interstitial lung markings, but with increased prominence suggesting fluid overload/interstitial edema. Electronically Signed   By: Nelson Chimes M.D.   On: 07/15/2017 19:10   Dg Chest 2 View  Result Date: 07/14/2017 CLINICAL DATA:  Cough for 3 weeks, fever today, decline energy, question pneumonia, history asthma, atrial fibrillation, stage III chronic kidney disease, dementia, essential hypertension, hypertrophic obstructive cardiomyopathy, coronary artery disease, pulmonary hypertension EXAM: CHEST  2 VIEW COMPARISON:  03/27/2015 FINDINGS: LEFT subclavian sequential pacemaker is project at RIGHT atrium and RIGHT ventricle, unchanged. Mild enlargement of cardiac silhouette. Mediastinal contours and pulmonary vascularity normal. Scattered minimal chronic accentuation of interstitial markings particularly at the bases. No  definite acute infiltrate, pleural effusion or pneumothorax. Bones demineralized. LEFT upper quadrant surgical clips again seen at IMPRESSION: Enlargement of cardiac silhouette. Minimal chronic basilar changes without acute infiltrate. Electronically Signed   By: Lavonia Dana M.D.   On: 07/14/2017 13:04   Dg Chest Port 1 View  Result Date: 07/21/2017 CLINICAL DATA:  Right PICC line insertion. EXAM: PORTABLE CHEST 1 VIEW COMPARISON:  07/18/2017. FINDINGS: PICC line noted with tip over superior vena cava. Cardiac pacer noted with lead tips in the right atrium right ventricle. Prior median sternotomy. Cardiomegaly with bilateral interstitial prominence and bilateral pleural effusions. Findings consistent CHF. Surgical clips left abdomen. IMPRESSION: 1.  PICC line noted with tip over superior vena cava. 2. Cardiac pacer noted with lead tips over the right atrium right ventricle. Prior median sternotomy. Cardiomegaly with mild bilateral interstitial prominence of small pleural effusions suggesting mild CHF. Electronically Signed   By:  Swift   On: 07/21/2017 13:54    Microbiology: Recent Results (from the past 240 hour(s))  Culture, sputum-assessment     Status: None   Collection Time: 07/16/17 11:21 AM  Result Value Ref Range Status   Specimen Description SPUTUM  Final   Special Requests NONE  Final   Sputum evaluation   Final    Sputum specimen not acceptable for testing.  Please recollect.   Gram Stain Report Called to,Read Back By and Verified With: L MCELWEE,RN AT 2549 07/16/17 BY L BENFIELD Performed at [facility], [place] [address], [place], [place] [address]    Report Status 07/16/2017 FINAL  Final  Surgical pcr screen     Status: None   Collection Time: 07/24/17  8:27 PM  Result Value Ref Range Status   MRSA, PCR NEGATIVE NEGATIVE Final   Staphylococcus aureus NEGATIVE NEGATIVE Final    Comment: (NOTE) The Xpert SA Assay (FDA approved for NASAL specimens in patients  42 years of age and older), is one component of a comprehensive surveillance program. It is not intended to diagnose infection nor to guide or monitor treatment. Performed at [facility], [place] [address], [place], [place] [address]      Labs: Basic Metabolic Panel: Recent Labs  Lab 07/20/17 0523 07/21/17 0558 07/23/17 0411 07/25/17 0357 07/26/17 0503  NA 138 139 136 136 137  K 3.6 3.9 3.1* 3.7 3.8  CL 106 107 101 101 100*  CO2 21* 21* _0 GLUCOSE 85 90 86 98 91  BUN 8 5* 5* <5* <5*  CREATININE 1.01* 0.98 1.00 1.08* 1.10*  CALCIUM 7.9* 8.4* 8.0* 8.2* 8.2*  MG  --  2.2  --  1.9 1.9  PHOS  --   --   --   --  2.7   Liver Function Tests: Recent Labs  Lab 07/21/17 0558 07/26/17 0503  AST 17  --   ALT 12*  --   ALKPHOS 65  --   BILITOT 1.4*  --   PROT 6.7  --   ALBUMIN 2.6* 2.6*   No results for input(s): LIPASE, AMYLASE in the last 168 hours. No results for input(s): AMMONIA in the last 168 hours. CBC: Recent Labs  Lab 07/20/17 0523 07/21/17 0558 07/25/17 0357 07/26/17 0503  WBC 12.5* 12.5* 10.5 9.3  NEUTROABS  --  9.2* 6.9 6.5  HGB 8.8* 9.4* 10.1* 9.8*  HCT 28.5* 30.2* 32.0* 31.3*  MCV 89.6 88.6 89.6 90.5  PLT 399 436* 436* 389   Cardiac Enzymes: No results for input(s): CKTOTAL, CKMB, CKMBINDEX, TROPONINI in the last 168 hours. BNP: BNP (last 3 results) Recent Labs    07/15/17 1826  BNP 366.4*    ProBNP (last 3 results) No results for input(s): PROBNP in the last 8760 hours.  CBG: No results for input(s): GLUCAP in the last 168 hours.     Signed:  Nita Sells MD   [facility] 07/26/2017, 11:00 AM

## 2017-07-26 NOTE — Progress Notes (Signed)
POST OPERATIVE NOTE:  07/26/2017 Donnamae Jude [id number]  VITALS: BP 130/64 (BP Location: Left Arm)   Pulse 90   Temp 98 F (36.7 C) (Oral)   Resp 18   Ht 5\' 6"  (1.676 m)   Wt 155 lb 3.2 oz (70.4 kg) Comment: scale b  SpO2 97%   BMI 25.05 kg/m   LABS:  Lab Results  Component Value Date   WBC 9.3 07/26/2017   HGB 9.8 (L) 07/26/2017   HCT 31.3 (L) 07/26/2017   MCV 90.5 07/26/2017   PLT 389 07/26/2017   BMET    Component Value Date/Time   NA 137 07/26/2017 0503   K 3.8 07/26/2017 0503   CL 100 (L) 07/26/2017 0503   CO2 25 07/26/2017 0503   GLUCOSE 91 07/26/2017 0503   BUN <5 (L) 07/26/2017 0503   CREATININE 1.10 (H) 07/26/2017 0503   CALCIUM 8.2 (L) 07/26/2017 0503   GFRNONAA 43 (L) 07/26/2017 0503   GFRAA 50 (L) 07/26/2017 0503    Lab Results  Component Value Date   INR 1.30 07/22/2017   INR 1.28 07/21/2017   INR 1.25 07/20/2017   No results found for: PTT   GLENYCE RANDLE is status post extraction of remaining teeth with alveoloplasty in the OR with general anesthesia on 07/25/17.  SUBJECTIVE: Patient with minimal complaint. Denies active bleeding.   EXAM: No sign of infection, heme or ooze. Sutures are intact. Patient healing in by primary closure.  ASSESSMENT: Post operative course is consistent with dental procedures performed in the OR. Patient is completely edentulous. Atrophy of alveolar ridges noted.  PLAN: 1. Use salt water rinses every 2 hours while awake 2. Patient ok for discharge from dental standpoint. 3. Return to Dental Medicine for suture removal in 7-10 days. 4. Patient to F/U with dentist of choice for denture fabrication after adequate healing.    Lenn Cal, DDS

## 2017-07-26 NOTE — Plan of Care (Signed)
  Completed/[facility] Behavior/Discharge Planning: Ability to manage health-related needs will improve 07/26/2017 1700 - Completed/Met by Alonna Buckler, RN Clinical Measurements: Ability to maintain clinical measurements within normal limits will improve 07/26/2017 1011 - Completed/Met by Alonna Buckler, RN Will remain free from infection 07/26/2017 1011 - Completed/Met by Alonna Buckler, RN Diagnostic test results will improve 07/26/2017 1011 - Completed/Met by Alonna Buckler, RN Respiratory complications will improve 07/26/2017 1011 - Completed/Met by Alonna Buckler, RN Cardiovascular complication will be avoided 07/26/2017 1011 - Completed/Met by Alonna Buckler, RN Elimination: Will not experience complications related to bowel motility 07/26/2017 1011 - Completed/Met by Alonna Buckler, RN Will not experience complications related to urinary retention 07/26/2017 1011 - Completed/Met by Alonna Buckler, RN Pain Managment: General experience of comfort will improve 07/26/2017 1011 - Completed/Met by Alonna Buckler, RN Safety: Ability to remain free from injury will improve 07/26/2017 1011 - Completed/Met by Alonna Buckler, RN Skin Integrity: Risk for impaired skin integrity will decrease 07/26/2017 1011 - Completed/Met by Alonna Buckler, RN

## 2017-07-26 NOTE — Progress Notes (Signed)
Patient is to go home with [facility] services provided by [place] for home IV antibiotics; Carolynn Sayers RN with [facility] has provided teaching with the daughter and is set for discharge home today; Pam to see patient/ family again today prior to discharging home; Aneta Mins [phone]

## 2017-07-26 NOTE — Progress Notes (Signed)
Progress Note  Patient Name: Jasmine [name] Date of Encounter: 07/26/2017  Primary Cardiologist:   No primary care provider on file.   Subjective   No acute distress.   Inpatient Medications    Scheduled Meds: . budesonide  0.25 mg Nebulization BID  . diltiazem  120 mg Oral Daily  . dorzolamide-timolol  1 drop Both Eyes BID  . ferrous sulfate  325 mg Oral Q breakfast  . furosemide  20 mg Oral Daily  . memantine  10 mg Oral BID  . metoprolol tartrate  50 mg Oral BID  . pantoprazole  40 mg Oral BID AC  . potassium chloride  40 mEq Oral Daily  . QUEtiapine  25 mg Oral QHS  . rifampin  300 mg Oral Q8H  . sodium chloride irrigation  1,000 mL Irrigation Q2H while awake  . sucralfate  1 g Oral TID WC & HS  . vitamin E  400 Units Oral Daily   Continuous Infusions: . cefTRIAXone (ROCEPHIN)  IV Stopped (07/25/17 1540)  . lactated ringers 10 mL/hr at 07/25/17 0955  . vancomycin Stopped (07/25/17 1650)   PRN Meds: acetaminophen **OR** acetaminophen, fluticasone, guaiFENesin, HYDROcodone-acetaminophen, ipratropium-albuterol, nitroGLYCERIN, ondansetron **OR** ondansetron (ZOFRAN) IV, sodium chloride flush   Vital Signs    Vitals:   07/25/17 1638 07/25/17 2008 07/26/17 0500 07/26/17 0831  BP: 132/60 (!) 150/77 130/64   Pulse: 100 100 90   Resp: 20 18 18    Temp: 98 F (36.7 C) 97.9 F (36.6 C) 98 F (36.7 C)   TempSrc: Oral Oral Oral   SpO2: 96% 98% 97% 97%  Weight:   155 lb 3.2 oz (70.4 kg)   Height:        Intake/Output Summary (Last 24 hours) at 07/26/2017 0845 Last data filed at 07/26/2017 0500 Gross per 24 hour  Intake 1190.83 ml  Output 2400 ml  Net -1209.17 ml   Filed Weights   07/24/17 0440 07/25/17 0347 07/26/17 0500  Weight: 156 lb 8 oz (71 kg) 155 lb 8 oz (70.5 kg) 155 lb 3.2 oz (70.4 kg)    Telemetry    Atrial fib with rate control - Personally Reviewed  ECG    NA - Personally Reviewed  Physical Exam   GEN: No  acute distress.   Neck: No   JVD Cardiac: Irregular RR, 3/6 apical systolic murmurs, rubs, or gallops.  Respiratory: Clear   to auscultation bilaterally. GI: Soft, nontender, non-distended, normal bowel sounds  MS:  Trace edema; No deformity. Neuro:   Nonfocal  Psych: Oriented and appropriate    Labs    Chemistry Recent Labs  Lab 07/21/17 0558 07/23/17 0411 07/25/17 0357 07/26/17 0503  NA 139 136 136 137  K 3.9 3.1* 3.7 3.8  CL 107 101 101 100*  CO2 21* 25 25 25   GLUCOSE 90 86 98 91  BUN 5* 5* <5* <5*  CREATININE 0.98 1.00 1.08* 1.10*  CALCIUM 8.4* 8.0* 8.2* 8.2*  PROT 6.7  --   --   --   ALBUMIN 2.6*  --   --  2.6*  AST 17  --   --   --   ALT 12*  --   --   --   ALKPHOS 65  --   --   --   BILITOT 1.4*  --   --   --   GFRNONAA 49* 48* 44* 43*  GFRAA 57* 56* 51* 50*  ANIONGAP 11 10 10  12  Hematology Recent Labs  Lab 07/21/17 0558 07/25/17 0357 07/26/17 0503  WBC 12.5* 10.5 9.3  RBC 3.41* 3.57* 3.46*  HGB 9.4* 10.1* 9.8*  HCT 30.2* 32.0* 31.3*  MCV 88.6 89.6 90.5  MCH 27.6 28.3 28.3  MCHC 31.1 31.6 31.3  RDW 14.7 15.2 15.2  PLT 436* 436* 389    Cardiac EnzymesNo results for input(s): TROPONINI in the last 168 hours. No results for input(s): TROPIPOC in the last 168 hours.   BNPNo results for input(s): BNP, PROBNP in the last 168 hours.   DDimer No results for input(s): DDIMER in the last 168 hours.   Radiology    No results found.  Cardiac Studies   TEE 2/12  Study Conclusions  - Left ventricle: The cavity size was normal. There was moderate   concentric hypertrophy. Systolic function was normal. The   estimated ejection fraction was in the range of 60% to 65%. - Aortic valve: No evidence of vegetation. There was mild   regurgitation. - Mitral valve: A bioprosthesis was present. The sewing ring was   partially dehisced, but the valve remains well seated, without   rocking. There was a 3-4 mm gap between the mitral annulus and   the prosthetic ring, over a roughly  8-10 mm long arc at the   posterior aspect of the ring. This is well seen on 3D imaging. No   vegetations are seen on the prosthetic leaflets. Wispy echogenic   structures along the atrial surface of the sewing ring likely   represent sewing material, not vegetations. There was mild   regurgitation directed centrally. There was moderate perivalvular   regurgitation. - Left atrium: The atrium was moderately to severely dilated. No   evidence of thrombus in the atrial cavity or appendage. There was   mildcontinuous spontaneous echo contrast (&quot;smoke&quot;) in the   appendage. - Right atrium: A 6-7 mm mobile mass was attached to the atrial   portion of the ventricular pacing lead. This is consistent with a   vegetation. The atrium was dilated. - Atrial septum: No defect or patent foramen ovale was identified. - Tricuspid valve: No evidence of vegetation. - Pulmonic valve: No evidence of vegetation. - Line: Two venous pacing wires were visualized in the right atrial   cavity and right ventricular cavity, attached to the right atrial   appendage and RV apex, respectively.   Patient Profile   84 y.o. female   with history of mitral regurgitation and hypertrophic cardiomyopathy, status post septal myomectomy and mitral valve replacement with 27 mm bioprosthesis in 2007, normal coronary arteries by angiography 2016, permanent atrial fibrillation with slow ventricular response status post pacemaker implantation([employer] Accent DR 2013, not pacemaker dependent)presents with acute heart failure exacerbation in the setting of protracted febrile illness, quickly responsive to antibiotic therapy, with echo showing evidence of perivalvular mitral insufficiency,raising suspicion for bioprosthetic endocarditis.Not receiving anticoagulation due to previous GI bleeding.  Assessment & Plan    MV prosthetic endocarditis:    Continue conservative therapy with antibiotics.   Prosthetic mitral valve  regurgitation:    As above.   Permanent atrial fibrillation:   Not an anticoag candidate.  Rate is controlled.  Continue current therapy.  Acute chronic diastolic heart failure:  Euvolemic.  Good diuresis since admission.  Continue current therapy.    Permanent pacemaker:  Dual-chamber device programmed VVIR for atrial fibrillation.  There are lesions on the pacer leads but she is not a candidate for extraction.   Continue antibiotic  therapy, follow up imaging and blood cultures after this.    Poor dentition:   Status post dental extraction yesterday.    We will follow as needed.    For questions or updates, please contact [place] Please consult [email or web address] for contact info under Cardiology/STEMI.   Signed, Minus Breeding, MD  07/26/2017, 8:45 AM

## 2017-07-27 DIAGNOSIS — I421 Obstructive hypertrophic cardiomyopathy: Secondary | ICD-10-CM | POA: Diagnosis not present

## 2017-07-27 DIAGNOSIS — R269 Unspecified abnormalities of gait and mobility: Secondary | ICD-10-CM | POA: Diagnosis not present

## 2017-07-27 DIAGNOSIS — I130 Hypertensive heart and chronic kidney disease with heart failure and stage 1 through stage 4 chronic kidney disease, or unspecified chronic kidney disease: Secondary | ICD-10-CM | POA: Diagnosis not present

## 2017-07-27 DIAGNOSIS — J189 Pneumonia, unspecified organism: Secondary | ICD-10-CM | POA: Diagnosis not present

## 2017-07-27 DIAGNOSIS — I2510 Atherosclerotic heart disease of native coronary artery without angina pectoris: Secondary | ICD-10-CM | POA: Diagnosis not present

## 2017-07-27 DIAGNOSIS — N183 Chronic kidney disease, stage 3 (moderate): Secondary | ICD-10-CM | POA: Diagnosis not present

## 2017-07-27 DIAGNOSIS — I5031 Acute diastolic (congestive) heart failure: Secondary | ICD-10-CM | POA: Diagnosis not present

## 2017-07-27 DIAGNOSIS — I300 Acute nonspecific idiopathic pericarditis: Secondary | ICD-10-CM | POA: Diagnosis not present

## 2017-07-27 DIAGNOSIS — I482 Chronic atrial fibrillation: Secondary | ICD-10-CM | POA: Diagnosis not present

## 2017-07-27 DIAGNOSIS — I272 Pulmonary hypertension, unspecified: Secondary | ICD-10-CM | POA: Diagnosis not present

## 2017-07-27 DIAGNOSIS — T827XXA Infection and inflammatory reaction due to other cardiac and vascular devices, implants and grafts, initial encounter: Secondary | ICD-10-CM | POA: Diagnosis not present

## 2017-07-27 DIAGNOSIS — I330 Acute and subacute infective endocarditis: Secondary | ICD-10-CM | POA: Diagnosis not present

## 2017-07-28 ENCOUNTER — Encounter: Payer: Self-pay | Admitting: Cardiology

## 2017-07-29 ENCOUNTER — Encounter: Payer: Self-pay | Admitting: Infectious Disease

## 2017-07-29 DIAGNOSIS — I482 Chronic atrial fibrillation: Secondary | ICD-10-CM | POA: Diagnosis not present

## 2017-07-29 DIAGNOSIS — I272 Pulmonary hypertension, unspecified: Secondary | ICD-10-CM | POA: Diagnosis not present

## 2017-07-29 DIAGNOSIS — I421 Obstructive hypertrophic cardiomyopathy: Secondary | ICD-10-CM | POA: Diagnosis not present

## 2017-07-29 DIAGNOSIS — I5031 Acute diastolic (congestive) heart failure: Secondary | ICD-10-CM | POA: Diagnosis not present

## 2017-07-29 DIAGNOSIS — I330 Acute and subacute infective endocarditis: Secondary | ICD-10-CM | POA: Diagnosis not present

## 2017-07-29 DIAGNOSIS — I130 Hypertensive heart and chronic kidney disease with heart failure and stage 1 through stage 4 chronic kidney disease, or unspecified chronic kidney disease: Secondary | ICD-10-CM | POA: Diagnosis not present

## 2017-07-29 DIAGNOSIS — N390 Urinary tract infection, site not specified: Secondary | ICD-10-CM | POA: Diagnosis not present

## 2017-07-29 DIAGNOSIS — J189 Pneumonia, unspecified organism: Secondary | ICD-10-CM | POA: Diagnosis not present

## 2017-07-29 DIAGNOSIS — I2510 Atherosclerotic heart disease of native coronary artery without angina pectoris: Secondary | ICD-10-CM | POA: Diagnosis not present

## 2017-07-29 DIAGNOSIS — N183 Chronic kidney disease, stage 3 (moderate): Secondary | ICD-10-CM | POA: Diagnosis not present

## 2017-08-01 ENCOUNTER — Encounter: Payer: Self-pay | Admitting: Infectious Disease

## 2017-08-01 ENCOUNTER — Other Ambulatory Visit: Payer: Self-pay | Admitting: *Deleted

## 2017-08-01 DIAGNOSIS — I130 Hypertensive heart and chronic kidney disease with heart failure and stage 1 through stage 4 chronic kidney disease, or unspecified chronic kidney disease: Secondary | ICD-10-CM | POA: Diagnosis not present

## 2017-08-01 DIAGNOSIS — R110 Nausea: Secondary | ICD-10-CM | POA: Diagnosis not present

## 2017-08-01 DIAGNOSIS — I5031 Acute diastolic (congestive) heart failure: Secondary | ICD-10-CM | POA: Diagnosis not present

## 2017-08-01 DIAGNOSIS — N183 Chronic kidney disease, stage 3 (moderate): Secondary | ICD-10-CM | POA: Diagnosis not present

## 2017-08-01 DIAGNOSIS — I421 Obstructive hypertrophic cardiomyopathy: Secondary | ICD-10-CM | POA: Diagnosis not present

## 2017-08-01 DIAGNOSIS — I481 Persistent atrial fibrillation: Secondary | ICD-10-CM | POA: Diagnosis not present

## 2017-08-01 DIAGNOSIS — I330 Acute and subacute infective endocarditis: Secondary | ICD-10-CM | POA: Diagnosis not present

## 2017-08-01 DIAGNOSIS — I272 Pulmonary hypertension, unspecified: Secondary | ICD-10-CM | POA: Diagnosis not present

## 2017-08-01 DIAGNOSIS — I2510 Atherosclerotic heart disease of native coronary artery without angina pectoris: Secondary | ICD-10-CM | POA: Diagnosis not present

## 2017-08-01 DIAGNOSIS — I482 Chronic atrial fibrillation: Secondary | ICD-10-CM | POA: Diagnosis not present

## 2017-08-01 DIAGNOSIS — I38 Endocarditis, valve unspecified: Secondary | ICD-10-CM | POA: Diagnosis not present

## 2017-08-01 DIAGNOSIS — I10 Essential (primary) hypertension: Secondary | ICD-10-CM | POA: Diagnosis not present

## 2017-08-01 DIAGNOSIS — J189 Pneumonia, unspecified organism: Secondary | ICD-10-CM | POA: Diagnosis not present

## 2017-08-01 DIAGNOSIS — Z952 Presence of prosthetic heart valve: Secondary | ICD-10-CM | POA: Diagnosis not present

## 2017-08-01 NOTE — Patient Outreach (Signed)
[facility] [place]) Care Management  08/01/2017  Jasmine [name] 02-Sep-1924 [id number]  [name]- Pneumonia RED ON [name] ALERT DAY#: 2 DATE: 07/29/17 RED ALERT: Feeling better overall? No   Outreach attempt #1 to patient. No answer. RN CM left HIPAA compliant message along with contact info.    Plan: RN CM will contact patient within one week.    Jasmine Bells, RN, BSN, MHA/MSL, [place] Telephonic Care Manager Coordinator [facility] Direct Phone: [phone] Toll Free: [phone] Fax: [phone]

## 2017-08-02 ENCOUNTER — Other Ambulatory Visit: Payer: Self-pay | Admitting: Pharmacist

## 2017-08-03 ENCOUNTER — Encounter (HOSPITAL_COMMUNITY): Payer: Self-pay | Admitting: Dentistry

## 2017-08-03 ENCOUNTER — Ambulatory Visit (HOSPITAL_COMMUNITY): Payer: Self-pay | Admitting: Dentistry

## 2017-08-03 VITALS — BP 125/70 | HR 81 | Temp 97.6°F

## 2017-08-03 DIAGNOSIS — K0820 Unspecified atrophy of edentulous alveolar ridge: Secondary | ICD-10-CM

## 2017-08-03 DIAGNOSIS — I058 Other rheumatic mitral valve diseases: Secondary | ICD-10-CM

## 2017-08-03 DIAGNOSIS — K08109 Complete loss of teeth, unspecified cause, unspecified class: Secondary | ICD-10-CM

## 2017-08-03 DIAGNOSIS — K08199 Complete loss of teeth due to other specified cause, unspecified class: Secondary | ICD-10-CM

## 2017-08-03 DIAGNOSIS — I059 Rheumatic mitral valve disease, unspecified: Secondary | ICD-10-CM

## 2017-08-03 NOTE — Patient Instructions (Addendum)
PLAN: 1. Continue salt water rinses as needed to aid healing. 2. Brush tongue daily. 3. Advance diet as tolerated. 4. Follow-up with her primary dentist for evaluation for fabrication of a new upper lower complete dentures or a new lower denture to  match her upper complete denture.   Lenn Cal, DDS  MOUTH CARE AFTER SURGERY  FACTS:  Ice used in ice bag helps keep the swelling down, and can help lessen the pain.  It is easier to treat pain BEFORE it happens.  Spitting disturbs the clot and may cause bleeding to start again, or to get worse.  Smoking delays healing and can cause complications.  Sharing prescriptions can be dangerous.  Do not take medications not recently prescribed for you.  Antibiotics may stop birth control pills from working.  Use other means of birth control while on antibiotics.  Warm salt water rinses after the first 24 hours will help lessen the swelling:  Use 1/2 teaspoonful of table salt per oz.of water.  DO NOT:  Do not spit.  Do not drink through a straw.  Strongly advised not to smoke, dip snuff or chew tobacco at least for 3 days.  Do not eat sharp or crunchy foods.  Avoid the area of surgery when chewing.  Do not stop your antibiotics before your instructions say to do so.  Do not eat hot foods until bleeding has stopped.  If you need to, let your food cool down to room temperature.  EXPECT:  Some swelling, especially first 2-3 days.  Soreness or discomfort in varying degrees.  Follow your dentist's instructions about how to handle pain before it starts.  Pinkish saliva or light blood in saliva, or on your pillow in the morning.  This can last around 24 hours.  Bruising inside or outside the mouth.  This may not show up until 2-3 days after surgery.  Don't worry, it will go away in time.  Pieces of "bone" may work themselves loose.  It's OK.  If they bother you, let us know.  WHAT TO DO IMMEDIATELY AFTER SURGERY:  Bite on the  gauze with steady pressure for 1-2 hours.  Don't chew on the gauze.  Do not lie down flat.  Raise your head support especially for the first 24 hours.  Apply ice to your face on the side of the surgery.  You may apply it 20 minutes on and a few minutes off.  Ice for 8-12 hours.  You may use ice up to 24 hours.  Before the numbness wears off, take a pain pill as instructed.  Prescription pain medication is not always required.  SWELLING:  Expect swelling for the first couple of days.  It should get better after that.  If swelling increases 3 days or so after surgery; let us know as soon as possible.  FEVER:  Take Tylenol every 4 hours if needed to lower your temperature, especially if it is at 100F or higher.  Drink lots of fluids.  If the fever does not go away, let us know.  BREATHING TROUBLE:  Any unusual difficulty breathing means you have to have someone bring you to the emergency room ASAP  BLEEDING:  Light oozing is expected for 24 hours or so.  Prop head up with pillows  Avoid spitting  Do not confuse bright red fresh flowing blood with lots of saliva colored with a little bit of blood.  If you notice some bleeding, place gauze or a tea bag  where it is bleeding and apply CONSTANT pressure by biting down for 1 hour.  Avoid talking during this time.  Do not remove the gauze or tea bag during this hour to "check" the bleeding.  If you notice bright RED bleeding FLOWING out of particular area, and filling the floor of your mouth, put a wad of gauze on that area, bite down firmly and constantly.  Call us immediately.  If we're closed, have someone bring you to the emergency room.  ORAL HYGIENE:  Brush your teeth as usual after meals and before bedtime.  Use a soft toothbrush around the area of surgery.  DO NOT AVOID BRUSHING.  Otherwise bacteria(germs) will grow and may delay healing or encourage infection.  Since you cannot spit, just gently rinse and let the  water flow out of your mouth.  DO NOT SWISH HARD.  EATING:  Cool liquids are a good point to start.  Increase to soft foods as tolerated.  PRESCRIPTIONS:  Follow the directions for your prescriptions exactly as written.  If Dr. Enrique Sack gave you a narcotic pain medication, do not drive, operate machinery or drink alcohol when on that medication.  QUESTIONS:  Call our office during office hours [phone] or call the Emergency Room at [phone].

## 2017-08-03 NOTE — Progress Notes (Signed)
POST OPERATIVE NOTE:  08/03/2017 Jasmine [name] [id number]  VITALS: BP 125/70 (BP Location: Left Arm)   Pulse 81   Temp 97.6 F (36.4 C)    LABS:  Lab Results  Component Value Date   WBC 9.3 07/26/2017   HGB 9.8 (L) 07/26/2017   HCT 31.3 (L) 07/26/2017   MCV 90.5 07/26/2017   PLT 389 07/26/2017   BMET    Component Value Date/Time   NA 137 07/26/2017 0503   K 3.8 07/26/2017 0503   CL 100 (L) 07/26/2017 0503   CO2 25 07/26/2017 0503   GLUCOSE 91 07/26/2017 0503   BUN <5 (L) 07/26/2017 0503   CREATININE 1.10 (H) 07/26/2017 0503   CALCIUM 8.2 (L) 07/26/2017 0503   GFRNONAA 43 (L) 07/26/2017 0503   GFRAA 50 (L) 07/26/2017 0503    Lab Results  Component Value Date   INR 1.30 07/22/2017   INR 1.28 07/21/2017   INR 1.25 07/20/2017   No results found for: PTT   Jasmine [name] is status post distraction remaining teeth with alveoloplasty in the operating with general anesthesia on 07/25/2017. The patient now presents for reevaluation of healing and suture removal.  SUBJECTIVE: Patient with minimal complaints from dental extraction sites. Several stitches remain loosely.  EXAM: There is no sign of infection, heme, or ooze. Patient is healing in by generalized primary closure. Several sutures remain loosely. The patient is now edentulous. There is atrophy of the edentulous alveolar ridges.  PROCEDURE: The patient was given a chlorhexidine gluconate rinse for 30 seconds. Sutures were then removed without complication. Patient tolerated the procedure well.  ASSESSMENT: Post operative course is consistent with dental procedures performed in the operating room with general anesthesia. The patient is edentulous. There is atrophy of the edentulous alveolar ridges.  PLAN: 1. Continue salt water rinses as needed to aid healing. 2. Brush tongue daily. 3. Advance diet as tolerated. 4. Follow-up with her primary dentist for evaluation for fabrication of a new upper lower  complete dentures or a new lower denture to  match her upper complete denture.   Lenn Cal, DDS

## 2017-08-04 ENCOUNTER — Other Ambulatory Visit: Payer: Self-pay

## 2017-08-04 DIAGNOSIS — I2510 Atherosclerotic heart disease of native coronary artery without angina pectoris: Secondary | ICD-10-CM | POA: Diagnosis not present

## 2017-08-04 DIAGNOSIS — I421 Obstructive hypertrophic cardiomyopathy: Secondary | ICD-10-CM | POA: Diagnosis not present

## 2017-08-04 DIAGNOSIS — I482 Chronic atrial fibrillation: Secondary | ICD-10-CM | POA: Diagnosis not present

## 2017-08-04 DIAGNOSIS — N183 Chronic kidney disease, stage 3 (moderate): Secondary | ICD-10-CM | POA: Diagnosis not present

## 2017-08-04 DIAGNOSIS — I5031 Acute diastolic (congestive) heart failure: Secondary | ICD-10-CM | POA: Diagnosis not present

## 2017-08-04 DIAGNOSIS — I330 Acute and subacute infective endocarditis: Secondary | ICD-10-CM | POA: Diagnosis not present

## 2017-08-04 DIAGNOSIS — I130 Hypertensive heart and chronic kidney disease with heart failure and stage 1 through stage 4 chronic kidney disease, or unspecified chronic kidney disease: Secondary | ICD-10-CM | POA: Diagnosis not present

## 2017-08-04 DIAGNOSIS — I339 Acute and subacute endocarditis, unspecified: Secondary | ICD-10-CM | POA: Diagnosis not present

## 2017-08-04 DIAGNOSIS — I272 Pulmonary hypertension, unspecified: Secondary | ICD-10-CM | POA: Diagnosis not present

## 2017-08-04 DIAGNOSIS — J189 Pneumonia, unspecified organism: Secondary | ICD-10-CM | POA: Diagnosis not present

## 2017-08-04 NOTE — Patient Outreach (Signed)
[name]) Care Management  08/04/2017  [name] Oct 22, 1924 [id number]     [name]- PNA RED ON [name] ALERT Day # 7 Date: 08/03/17 Red Alert Reason: "Wheezing more than yesterday? Yes"    Outreach attempt # 1 to patient. No answer at present. RN CM left HIPAA compliant voicemail message along with contact info.        Plan: RN CM will make outreach to patient in one business day if no return call from patient.   Enzo Montgomery, RN,BSN,CCM [place] Management Telephonic Care Management Coordinator Direct Phone: [phone] Toll Free: [phone] Fax: [phone]

## 2017-08-05 ENCOUNTER — Other Ambulatory Visit: Payer: Self-pay

## 2017-08-05 ENCOUNTER — Other Ambulatory Visit: Payer: Self-pay | Admitting: Pharmacist

## 2017-08-05 DIAGNOSIS — T827XXA Infection and inflammatory reaction due to other cardiac and vascular devices, implants and grafts, initial encounter: Secondary | ICD-10-CM | POA: Diagnosis not present

## 2017-08-05 DIAGNOSIS — I300 Acute nonspecific idiopathic pericarditis: Secondary | ICD-10-CM | POA: Diagnosis not present

## 2017-08-05 DIAGNOSIS — R269 Unspecified abnormalities of gait and mobility: Secondary | ICD-10-CM | POA: Diagnosis not present

## 2017-08-05 NOTE — Patient Outreach (Signed)
[facility]) Care Management  08/05/2017  STATIA BURDICK 04-Jan-1925 [id number]    [name]- PNA RED ON [name] ALERT Day # 7 Date: 08/03/17 Red Alert Reason: "Wheezing more than yesterday? Yes"    Outreach attempt #2 to patient. No answer at present.        Plan:  RN CM will send unsuccessful outreach letter to patient, wait 10 business days then make final call attempt and close case if no response from patient.    Enzo Montgomery, RN,BSN,CCM [place] Management Telephonic Care Management Coordinator Direct Phone: [phone] Toll Free: [phone] Fax: [phone]

## 2017-08-08 ENCOUNTER — Encounter: Payer: Self-pay | Admitting: Infectious Disease

## 2017-08-08 DIAGNOSIS — I130 Hypertensive heart and chronic kidney disease with heart failure and stage 1 through stage 4 chronic kidney disease, or unspecified chronic kidney disease: Secondary | ICD-10-CM | POA: Diagnosis not present

## 2017-08-08 DIAGNOSIS — I339 Acute and subacute endocarditis, unspecified: Secondary | ICD-10-CM | POA: Diagnosis not present

## 2017-08-08 DIAGNOSIS — I482 Chronic atrial fibrillation: Secondary | ICD-10-CM | POA: Diagnosis not present

## 2017-08-08 DIAGNOSIS — I421 Obstructive hypertrophic cardiomyopathy: Secondary | ICD-10-CM | POA: Diagnosis not present

## 2017-08-08 DIAGNOSIS — N183 Chronic kidney disease, stage 3 (moderate): Secondary | ICD-10-CM | POA: Diagnosis not present

## 2017-08-08 DIAGNOSIS — I272 Pulmonary hypertension, unspecified: Secondary | ICD-10-CM | POA: Diagnosis not present

## 2017-08-08 DIAGNOSIS — J189 Pneumonia, unspecified organism: Secondary | ICD-10-CM | POA: Diagnosis not present

## 2017-08-08 DIAGNOSIS — I5031 Acute diastolic (congestive) heart failure: Secondary | ICD-10-CM | POA: Diagnosis not present

## 2017-08-08 DIAGNOSIS — I2510 Atherosclerotic heart disease of native coronary artery without angina pectoris: Secondary | ICD-10-CM | POA: Diagnosis not present

## 2017-08-08 DIAGNOSIS — I330 Acute and subacute infective endocarditis: Secondary | ICD-10-CM | POA: Diagnosis not present

## 2017-08-11 ENCOUNTER — Encounter: Payer: Self-pay | Admitting: Infectious Disease

## 2017-08-11 DIAGNOSIS — N183 Chronic kidney disease, stage 3 (moderate): Secondary | ICD-10-CM | POA: Diagnosis not present

## 2017-08-11 DIAGNOSIS — I330 Acute and subacute infective endocarditis: Secondary | ICD-10-CM | POA: Diagnosis not present

## 2017-08-11 DIAGNOSIS — I130 Hypertensive heart and chronic kidney disease with heart failure and stage 1 through stage 4 chronic kidney disease, or unspecified chronic kidney disease: Secondary | ICD-10-CM | POA: Diagnosis not present

## 2017-08-11 DIAGNOSIS — I421 Obstructive hypertrophic cardiomyopathy: Secondary | ICD-10-CM | POA: Diagnosis not present

## 2017-08-11 DIAGNOSIS — I5031 Acute diastolic (congestive) heart failure: Secondary | ICD-10-CM | POA: Diagnosis not present

## 2017-08-11 DIAGNOSIS — I482 Chronic atrial fibrillation: Secondary | ICD-10-CM | POA: Diagnosis not present

## 2017-08-11 DIAGNOSIS — I272 Pulmonary hypertension, unspecified: Secondary | ICD-10-CM | POA: Diagnosis not present

## 2017-08-11 DIAGNOSIS — J189 Pneumonia, unspecified organism: Secondary | ICD-10-CM | POA: Diagnosis not present

## 2017-08-11 DIAGNOSIS — I2510 Atherosclerotic heart disease of native coronary artery without angina pectoris: Secondary | ICD-10-CM | POA: Diagnosis not present

## 2017-08-12 ENCOUNTER — Other Ambulatory Visit: Payer: Self-pay | Admitting: Pharmacist

## 2017-08-13 DIAGNOSIS — T827XXA Infection and inflammatory reaction due to other cardiac and vascular devices, implants and grafts, initial encounter: Secondary | ICD-10-CM | POA: Diagnosis not present

## 2017-08-13 DIAGNOSIS — R269 Unspecified abnormalities of gait and mobility: Secondary | ICD-10-CM | POA: Diagnosis not present

## 2017-08-13 DIAGNOSIS — I300 Acute nonspecific idiopathic pericarditis: Secondary | ICD-10-CM | POA: Diagnosis not present

## 2017-08-15 DIAGNOSIS — I2510 Atherosclerotic heart disease of native coronary artery without angina pectoris: Secondary | ICD-10-CM | POA: Diagnosis not present

## 2017-08-15 DIAGNOSIS — I130 Hypertensive heart and chronic kidney disease with heart failure and stage 1 through stage 4 chronic kidney disease, or unspecified chronic kidney disease: Secondary | ICD-10-CM | POA: Diagnosis not present

## 2017-08-15 DIAGNOSIS — I5031 Acute diastolic (congestive) heart failure: Secondary | ICD-10-CM | POA: Diagnosis not present

## 2017-08-15 DIAGNOSIS — N183 Chronic kidney disease, stage 3 (moderate): Secondary | ICD-10-CM | POA: Diagnosis not present

## 2017-08-15 DIAGNOSIS — I330 Acute and subacute infective endocarditis: Secondary | ICD-10-CM | POA: Diagnosis not present

## 2017-08-15 DIAGNOSIS — I272 Pulmonary hypertension, unspecified: Secondary | ICD-10-CM | POA: Diagnosis not present

## 2017-08-15 DIAGNOSIS — I421 Obstructive hypertrophic cardiomyopathy: Secondary | ICD-10-CM | POA: Diagnosis not present

## 2017-08-15 DIAGNOSIS — J189 Pneumonia, unspecified organism: Secondary | ICD-10-CM | POA: Diagnosis not present

## 2017-08-15 DIAGNOSIS — I482 Chronic atrial fibrillation: Secondary | ICD-10-CM | POA: Diagnosis not present

## 2017-08-15 DIAGNOSIS — I339 Acute and subacute endocarditis, unspecified: Secondary | ICD-10-CM | POA: Diagnosis not present

## 2017-08-16 ENCOUNTER — Ambulatory Visit (INDEPENDENT_AMBULATORY_CARE_PROVIDER_SITE_OTHER): Payer: Medicare HMO | Admitting: *Deleted

## 2017-08-16 DIAGNOSIS — I495 Sick sinus syndrome: Secondary | ICD-10-CM | POA: Diagnosis not present

## 2017-08-16 NOTE — Progress Notes (Signed)
Remote pacemaker transmission.   

## 2017-08-17 ENCOUNTER — Encounter: Payer: Self-pay | Admitting: Infectious Disease

## 2017-08-17 ENCOUNTER — Encounter: Payer: Self-pay | Admitting: Cardiology

## 2017-08-17 ENCOUNTER — Ambulatory Visit (INDEPENDENT_AMBULATORY_CARE_PROVIDER_SITE_OTHER): Payer: Medicare HMO | Admitting: Infectious Disease

## 2017-08-17 VITALS — BP 135/79 | HR 71 | Temp 97.8°F | Ht 65.5 in | Wt 156.0 lb

## 2017-08-17 DIAGNOSIS — T827XXD Infection and inflammatory reaction due to other cardiac and vascular devices, implants and grafts, subsequent encounter: Secondary | ICD-10-CM

## 2017-08-17 DIAGNOSIS — I330 Acute and subacute infective endocarditis: Secondary | ICD-10-CM

## 2017-08-17 DIAGNOSIS — N183 Chronic kidney disease, stage 3 unspecified: Secondary | ICD-10-CM

## 2017-08-17 DIAGNOSIS — T826XXD Infection and inflammatory reaction due to cardiac valve prosthesis, subsequent encounter: Secondary | ICD-10-CM | POA: Diagnosis not present

## 2017-08-17 DIAGNOSIS — I5033 Acute on chronic diastolic (congestive) heart failure: Secondary | ICD-10-CM | POA: Diagnosis not present

## 2017-08-17 DIAGNOSIS — K029 Dental caries, unspecified: Secondary | ICD-10-CM

## 2017-08-17 DIAGNOSIS — I38 Endocarditis, valve unspecified: Secondary | ICD-10-CM

## 2017-08-17 NOTE — Progress Notes (Signed)
Subjective:   Chief complaint: followup for ICD and prosthetic valve endocarditis.   Patient ID: Jasmine [name], female    DOB: 05-21-1925, 84 y.o.   MRN: [id number]  HPI   84 y.o.femalewith aprosthetic mitral valve replacement, sick sinus syndrome s/p pacemaker placement, andCKD Stage IIIwho presented with endocarditis of her prosthetic mitral valve and a vegetation on her pacemaker leads. She was not a candidate for surgical intervention per cardiology. She had levaquin prior to blood cultures having been drawn. We ended up placing her on Vancomycin, Ceftriaxone and oral rifampin for Prosthetic Valve Endocarditis and ICD infection.  She had several teeth that were suspect as being a cause and these were removed by Dr. Lawana Chambers while she was in the hospital.  She has beed DC and currently at home receiving IV abx by [facility]. She was receiving vancomycin IV q 48 hours and vancomycin levels were therapeutic but her serum creatinine rose to 1.57 and vancomycin was held.  Past Medical History:  Diagnosis Date  . Aortic insufficiency    a. 06/2014 Echo: Mild AI.  Marland Kitchen Asthma   . Atrial fibrillation and flutter    a. atrial fibrillation s/p DCCV 06-2012;  b. now in chronic atrial fibrillation off anticoagulation due to GI bleed.  . Bradycardia    a. 10/2011 s/p SJM Accent DR DC PPM, ser #: [id number].  Marland Kitchen Chronic kidney disease (CKD), stage III (moderate) (HCC)   . Dementia   . Diverticulosis of colon with hemorrhage 08/12/2012  . Essential hypertension   . GERD (gastroesophageal reflux disease)   . Glaucoma   . History of GI Bleed    a. prevents use of Qulin.  Marland Kitchen History of Severe Mitral Regurgitation    a. s/p MVR with pericardial tissue valve;  b. 06/2014 Mild Mitral Stenosis by echo.  Marland Kitchen HOCM (hypertrophic obstructive cardiomyopathy) ([place])    a. s/p septal myomectomy;  b. 06/2014 Echo: EF 50-55%, mild antsept HK, mild MR, mild MS/MR (bioprosth valve), mildly dil LA, mod TR, PASP 9mHg.  .Marland Kitchen Hyperlipidemia   . Insomnia   . Non-obstructive coronary artery disease    a. 06/2014 NSTEMI/Cath: LM nl, LAD nl, LCX nl, RCA 10-234mEF 45%.  . Pulmonary HTN ([facility]   a. mild to moderate (PASP 3239m by echo 06/2014).  . Reflux esophagitis     Past Surgical History:  Procedure Laterality Date  . APPENDECTOMY    . CARDIAC CATHETERIZATION    . CARDIAC CATHETERIZATION N/A 03/27/2015   Procedure: Left Heart Cath and Coronary Angiography;  Surgeon: DavLeonie ManD;  Location: [facility] [place] [facility];  Service: Cardiovascular;  Laterality: N/A;  . CARDIAC VALVE REPLACEMENT  2007   MVR  . CARDIOVERSION  07/04/2012   Procedure: CARDIOVERSION;  Surgeon: TraSueanne MargaritaD;  Location: [facility];  Service: Cardiovascular;  Laterality: N/A;  h/p in file drawer   . CATARACT EXTRACTION W/ INTRAOCULAR LENS  IMPLANT, BILATERAL    . CHOLECYSTECTOMY    . COLON RESECTION    . COLONOSCOPY    . COLONOSCOPY N/A 08/13/2012   Procedure: COLONOSCOPY;  Surgeon: CarGatha MayerD;  [facility] [place];  Laterality: N/A;  . HERNIA REPAIR     "in my stomach"  . LEFT HEART CATHETERIZATION WITH CORONARY ANGIOGRAM N/A 06/11/2014   Procedure: LEFT HEART CATHETERIZATION WITH CORONARY ANGIOGRAM;  Surgeon: ThoTroy SineD;  [facility];  Service: Cardiovascular;  Laterality: N/A;  . MULTIPLE  EXTRACTIONS WITH ALVEOLOPLASTY N/A 07/25/2017   Procedure: EXTRACTION OF TOOTH #'S 22-27 WITH ALVEOLOPLASTY;  Surgeon: Lenn Cal, DDS;  Location: [place];  Service: Oral Surgery;  Laterality: N/A;  . MYOMECTOMY  2007   septal  . PERMANENT PACEMAKER INSERTION N/A 10/07/2011   Procedure: PERMANENT PACEMAKER INSERTION;  Surgeon: Deboraha Sprang, MD;  Location: [facility] CATH LAB;  Service: Cardiovascular;  Laterality: N/A;  . TEE WITHOUT CARDIOVERSION N/A 07/19/2017   Procedure: TRANSESOPHAGEAL ECHOCARDIOGRAM (TEE);  Surgeon: Sanda Klein, MD;  Location: [facility] ENDOSCOPY;  Service: Cardiovascular;   Laterality: N/A;  . TONSILLECTOMY    . TUBAL LIGATION      Family History  Problem Relation Age of Onset  . CVA Mother   . Hypertension Mother   . CVA Brother   . Hypertension Brother       Social History   Socioeconomic History  . Marital status: Widowed    Spouse name: None  . Number of children: 3  . Years of education: None  . Highest education level: None  Social Needs  . Financial resource strain: None  . Food insecurity - worry: None  . Food insecurity - inability: None  . Transportation needs - medical: None  . Transportation needs - non-medical: None  Occupational History  . None  Tobacco Use  . Smoking status: Former Smoker    Types: Cigarettes    Last attempt to quit: 06/07/1974    Years since quitting: 43.2  . Smokeless tobacco: Former Systems developer    Types: Snuff    Quit date: 04/23/1975  . Tobacco comment: 10/05/11 "  Substance and Sexual Activity  . Alcohol use: No    Alcohol/week: 0.0 oz    Comment: 10/05/11 "used to drink; quit a long time ago"  . Drug use: No  . Sexual activity: No  Other Topics Concern  . None  Social History Narrative  . None    Allergies  Allergen Reactions  . Cymbalta [Duloxetine Hcl] Diarrhea and Other (See Comments)    weakness  . Dexilant [Dexlansoprazole] Diarrhea and Other (See Comments)    Bloating also     Current Outpatient Medications:  .  acetaminophen (TYLENOL) 500 MG tablet, Take 500 mg by mouth every 6 (six) hours as needed. For pain, Disp: , Rfl:  .  albuterol (PROVENTIL HFA;VENTOLIN HFA) 108 (90 BASE) MCG/ACT inhaler, Inhale 2 puffs into the lungs every 4 (four) hours as needed for wheezing or shortness of breath. For shortness of breath, Disp: , Rfl:  .  budesonide (PULMICORT) 180 MCG/ACT inhaler, Inhale 2 puffs into the lungs daily as needed (SOB/Wheezing). , Disp: , Rfl:  .  cefTRIAXone (ROCEPHIN) IVPB, Inject 2 g into the vein daily. Indication:  endocarditis Last Day of Therapy:  08/29/2017 Labs - Once  weekly:  CBC/D and BMP, Labs - Every other week:  ESR and CRP, Disp: 40 Units, Rfl: 0 .  cholecalciferol (VITAMIN D) 400 UNITS TABS tablet, Take 400 Units by mouth daily., Disp: , Rfl:  .  COMBIGAN 0.2-0.5 % ophthalmic solution, Place 1 drop into both eyes daily., Disp: , Rfl:  .  diltiazem (CARDIZEM CD) 120 MG 24 hr capsule, Take 1 capsule (120 mg total) by mouth daily., Disp: 30 capsule, Rfl: 0 .  dorzolamide-timolol (COSOPT) 22.3-6.8 MG/ML ophthalmic solution, Place 1 drop into both eyes 2 (two) times daily. , Disp: , Rfl:  .  ferrous sulfate 325 (65 FE) MG tablet, Take 1 tablet (325 mg total) by mouth  daily with breakfast., Disp: 30 tablet, Rfl: 1 .  fluticasone (FLONASE) 50 MCG/ACT nasal spray, Place 1 spray into both nostrils daily as needed for allergies or rhinitis. , Disp: , Rfl:  .  FOLIC ACID-VIT I7-TIW P80 PO, Take 1 tablet by mouth daily., Disp: , Rfl:  .  cefTRIAXone (ROCEPHIN) 10 g injection, , Disp: , Rfl:  .  furosemide (LASIX) 20 MG tablet, Take 1 tablet (20 mg total) by mouth daily., Disp: 30 tablet, Rfl: 0 .  guaiFENesin (MUCINEX) 600 MG 12 hr tablet, Take 600 mg by mouth 2 (two) times daily as needed for cough or to loosen phlegm. , Disp: , Rfl:  .  HYDROcodone-acetaminophen (HYCET) 7.5-325 mg/15 ml solution, Take 10-15 mLs by mouth every 6 (six) hours as needed for moderate pain or severe pain., Disp: 120 mL, Rfl: 0 .  hydrocortisone cream 1 %, Apply 1 application topically as needed. , Disp: , Rfl:  .  memantine (NAMENDA) 10 MG tablet, Take 10 mg by mouth 2 (two) times daily., Disp: , Rfl:  .  metoprolol (LOPRESSOR) 50 MG tablet, Take 1 tablet (50 mg total) by mouth 2 (two) times daily., Disp: 60 tablet, Rfl: 0 .  Multiple Vitamins-Minerals (ALIVE WOMENS 50+ PO), Take by mouth daily., Disp: , Rfl:  .  nitroGLYCERIN (NITROSTAT) 0.4 MG SL tablet, Place 1 tablet (0.4 mg total) under the tongue every 5 (five) minutes as needed for chest pain., Disp: 10 tablet, Rfl: 2 .   ondansetron (ZOFRAN) 4 MG tablet, Take 1 tablet (4 mg total) by mouth every 8 (eight) hours as needed for nausea or vomiting., Disp: 20 tablet, Rfl: 0 .  pantoprazole (PROTONIX) 40 MG tablet, Take 1 tablet (40 mg total) by mouth 2 (two) times daily before a meal., Disp: 60 tablet, Rfl: 3 .  potassium chloride SA (K-DUR,KLOR-CON) 20 MEQ tablet, Take 2 tablets (40 mEq total) by mouth daily., Disp: 15 tablet, Rfl: 0 .  QUEtiapine (SEROQUEL) 25 MG tablet, Take 25 mg by mouth at bedtime., Disp: , Rfl:  .  rifampin (RIFADIN) 300 MG capsule, Take 1 capsule (300 mg total) by mouth every 8 (eight) hours., Disp: 90 capsule, Rfl: 1 .  sucralfate (CARAFATE) 1 g tablet, Take 1 tablet (1 g total) by mouth 4 (four) times daily -  with meals and at bedtime., Disp: 30 tablet, Rfl: 0 .  vancomycin (VANCOCIN) 10 G SOLR injection, , Disp: , Rfl:  .  vancomycin IVPB, Inject 1,000 mg into the vein daily. Indication:  endocarditis Last Day of Therapy:  08/29/17 Labs - Sunday/Monday:  CBC/D, BMP, and vancomycin trough. Labs - Thursday:  BMP and vancomycin trough Labs - Every other week:  ESR and CRP, Disp: 40 Units, Rfl: 0 .  vitamin E 400 UNIT capsule, Take 400 Units by mouth daily., Disp: , Rfl:      Review of Systems  Constitutional: Negative for activity change, appetite change, chills, diaphoresis, fatigue, fever and unexpected weight change.  HENT: Negative for congestion, rhinorrhea, sinus pressure, sneezing, sore throat and trouble swallowing.   Eyes: Negative for photophobia and visual disturbance.  Respiratory: Positive for cough. Negative for chest tightness, shortness of breath, wheezing and stridor.   Cardiovascular: Negative for chest pain, palpitations and leg swelling.  Gastrointestinal: Negative for abdominal distention, abdominal pain, anal bleeding, blood in stool, constipation, diarrhea, nausea and vomiting.  Genitourinary: Negative for difficulty urinating, dysuria, flank pain and hematuria.    Musculoskeletal: Negative for arthralgias, back pain, gait problem,  joint swelling and myalgias.  Skin: Negative for color change, pallor, rash and wound.  Neurological: Negative for dizziness, tremors, weakness and light-headedness.  Hematological: Negative for adenopathy. Does not bruise/bleed easily.  Psychiatric/Behavioral: Negative for agitation, behavioral problems, confusion, decreased concentration, dysphoric mood and sleep disturbance.       Objective:   Physical Exam  Constitutional: She is oriented to person, place, and time. She appears well-developed and well-nourished. No distress.  HENT:  Head: Normocephalic and atraumatic.  Mouth/Throat: No oropharyngeal exudate.  Eyes: Conjunctivae and EOM are normal. No scleral icterus.  Neck: Normal range of motion. Neck supple.  Cardiovascular: Normal rate, regular rhythm and normal heart sounds. Exam reveals no gallop and no friction rub.  No murmur heard. Pulmonary/Chest: Effort normal and breath sounds normal. No respiratory distress. She has no wheezes. She has no rales.  Abdominal: Soft. Bowel sounds are normal. She exhibits no distension.  Musculoskeletal: She exhibits no edema or tenderness.  Neurological: She is alert and oriented to person, place, and time. She exhibits normal muscle tone. Coordination normal.  Skin: Skin is warm and dry. No rash noted. She is not diaphoretic. No erythema. No pallor.  Psychiatric: She has a normal mood and affect. Her behavior is normal. Judgment and thought content normal.    Her PM pocket was clean  Picc line is clean 08/17/17:          Assessment & Plan:    Prosthetic valve endocarditis and Pacemaker infection:  Due to rising serum creatine vancomycin has been held. I would like to switch to daptomycin + ceftriaxone and oral rifampin  IF not then my next option would be Teflaro + rifampin  I suspect her teeth may have been the source and I will change her to long term oral  penicillin after her IV abx are done   Acute on CKD: vancomycin being held  Morning cough: I doubt she has bacterial pathogen causing this symptom that would not be covered by her curren antimicrobials Anaerobes and pseudomonas are only remotely likely  things not being covered   I spent greater than 25 minutes with the patient including greater than 50% of time in face to face counsel of the patient and her daughter re how we will treat her infection, risk/benefits to different antibiotics and in coordination of her care with [facility] pharmacy and [facility] pharmacy.

## 2017-08-18 ENCOUNTER — Other Ambulatory Visit: Payer: Self-pay

## 2017-08-18 ENCOUNTER — Telehealth: Payer: Self-pay | Admitting: Pharmacist

## 2017-08-18 DIAGNOSIS — I2510 Atherosclerotic heart disease of native coronary artery without angina pectoris: Secondary | ICD-10-CM | POA: Diagnosis not present

## 2017-08-18 DIAGNOSIS — I421 Obstructive hypertrophic cardiomyopathy: Secondary | ICD-10-CM | POA: Diagnosis not present

## 2017-08-18 DIAGNOSIS — I130 Hypertensive heart and chronic kidney disease with heart failure and stage 1 through stage 4 chronic kidney disease, or unspecified chronic kidney disease: Secondary | ICD-10-CM | POA: Diagnosis not present

## 2017-08-18 DIAGNOSIS — N183 Chronic kidney disease, stage 3 (moderate): Secondary | ICD-10-CM | POA: Diagnosis not present

## 2017-08-18 DIAGNOSIS — I272 Pulmonary hypertension, unspecified: Secondary | ICD-10-CM | POA: Diagnosis not present

## 2017-08-18 DIAGNOSIS — I482 Chronic atrial fibrillation: Secondary | ICD-10-CM | POA: Diagnosis not present

## 2017-08-18 DIAGNOSIS — I330 Acute and subacute infective endocarditis: Secondary | ICD-10-CM | POA: Diagnosis not present

## 2017-08-18 DIAGNOSIS — J189 Pneumonia, unspecified organism: Secondary | ICD-10-CM | POA: Diagnosis not present

## 2017-08-18 DIAGNOSIS — I5031 Acute diastolic (congestive) heart failure: Secondary | ICD-10-CM | POA: Diagnosis not present

## 2017-08-18 DIAGNOSIS — I339 Acute and subacute endocarditis, unspecified: Secondary | ICD-10-CM | POA: Diagnosis not present

## 2017-08-18 NOTE — Patient Outreach (Signed)
[facility]) Care Management  08/18/2017  ROBYNNE ROAT [birth year] [id number]   [name]-PNA RED ON [name] ALERT Day #7 Date:08/03/17 Red Alert Reason:"Wheezing more than yesterday? Yes"   Outreach attempt #3 to patient. No answer at present. Multiple attempts to establish contact with patient without success. No response from letter mailed to patient. Case is being closed at this time.      Plan: RN CM will notify [facility] administrative assistant of case status.   Enzo Montgomery, RN,BSN,CCM [facility] Telephonic Care Management Coordinator Direct Phone: [phone] Toll Free: [phone] Fax: [phone]

## 2017-08-18 NOTE — Telephone Encounter (Signed)
Jeani Hawking from [facility] called and stated the patient's insurance covers both daptomycin and ceftaroline. Will d/c vancomycin and start patient on daptomycin per Dr. Tommy Medal. Will continue ceftriaxone. Will keep stop date as 3/25 unless otherwise told. Jeani Hawking verbalized understanding.

## 2017-08-19 DIAGNOSIS — J189 Pneumonia, unspecified organism: Secondary | ICD-10-CM | POA: Diagnosis not present

## 2017-08-19 DIAGNOSIS — I5031 Acute diastolic (congestive) heart failure: Secondary | ICD-10-CM | POA: Diagnosis not present

## 2017-08-19 DIAGNOSIS — I130 Hypertensive heart and chronic kidney disease with heart failure and stage 1 through stage 4 chronic kidney disease, or unspecified chronic kidney disease: Secondary | ICD-10-CM | POA: Diagnosis not present

## 2017-08-19 DIAGNOSIS — I272 Pulmonary hypertension, unspecified: Secondary | ICD-10-CM | POA: Diagnosis not present

## 2017-08-19 DIAGNOSIS — N183 Chronic kidney disease, stage 3 (moderate): Secondary | ICD-10-CM | POA: Diagnosis not present

## 2017-08-19 DIAGNOSIS — I330 Acute and subacute infective endocarditis: Secondary | ICD-10-CM | POA: Diagnosis not present

## 2017-08-19 DIAGNOSIS — I482 Chronic atrial fibrillation: Secondary | ICD-10-CM | POA: Diagnosis not present

## 2017-08-19 DIAGNOSIS — I421 Obstructive hypertrophic cardiomyopathy: Secondary | ICD-10-CM | POA: Diagnosis not present

## 2017-08-19 DIAGNOSIS — I2510 Atherosclerotic heart disease of native coronary artery without angina pectoris: Secondary | ICD-10-CM | POA: Diagnosis not present

## 2017-08-19 NOTE — Telephone Encounter (Signed)
Perfect. Hopefully her serum cr comes down. I want to dose her with renally dosed oral PCN once she is done with IV abx.

## 2017-08-21 DIAGNOSIS — R269 Unspecified abnormalities of gait and mobility: Secondary | ICD-10-CM | POA: Diagnosis not present

## 2017-08-21 DIAGNOSIS — I300 Acute nonspecific idiopathic pericarditis: Secondary | ICD-10-CM | POA: Diagnosis not present

## 2017-08-21 DIAGNOSIS — T827XXA Infection and inflammatory reaction due to other cardiac and vascular devices, implants and grafts, initial encounter: Secondary | ICD-10-CM | POA: Diagnosis not present

## 2017-08-22 ENCOUNTER — Encounter: Payer: Self-pay | Admitting: Infectious Disease

## 2017-08-22 DIAGNOSIS — I482 Chronic atrial fibrillation: Secondary | ICD-10-CM | POA: Diagnosis not present

## 2017-08-22 DIAGNOSIS — I5031 Acute diastolic (congestive) heart failure: Secondary | ICD-10-CM | POA: Diagnosis not present

## 2017-08-22 DIAGNOSIS — I330 Acute and subacute infective endocarditis: Secondary | ICD-10-CM | POA: Diagnosis not present

## 2017-08-22 DIAGNOSIS — I130 Hypertensive heart and chronic kidney disease with heart failure and stage 1 through stage 4 chronic kidney disease, or unspecified chronic kidney disease: Secondary | ICD-10-CM | POA: Diagnosis not present

## 2017-08-22 DIAGNOSIS — J189 Pneumonia, unspecified organism: Secondary | ICD-10-CM | POA: Diagnosis not present

## 2017-08-22 DIAGNOSIS — I421 Obstructive hypertrophic cardiomyopathy: Secondary | ICD-10-CM | POA: Diagnosis not present

## 2017-08-22 DIAGNOSIS — N183 Chronic kidney disease, stage 3 (moderate): Secondary | ICD-10-CM | POA: Diagnosis not present

## 2017-08-22 DIAGNOSIS — I2510 Atherosclerotic heart disease of native coronary artery without angina pectoris: Secondary | ICD-10-CM | POA: Diagnosis not present

## 2017-08-22 DIAGNOSIS — I272 Pulmonary hypertension, unspecified: Secondary | ICD-10-CM | POA: Diagnosis not present

## 2017-08-23 DIAGNOSIS — N183 Chronic kidney disease, stage 3 (moderate): Secondary | ICD-10-CM | POA: Diagnosis not present

## 2017-08-23 DIAGNOSIS — J189 Pneumonia, unspecified organism: Secondary | ICD-10-CM | POA: Diagnosis not present

## 2017-08-23 DIAGNOSIS — I130 Hypertensive heart and chronic kidney disease with heart failure and stage 1 through stage 4 chronic kidney disease, or unspecified chronic kidney disease: Secondary | ICD-10-CM | POA: Diagnosis not present

## 2017-08-23 DIAGNOSIS — I482 Chronic atrial fibrillation: Secondary | ICD-10-CM | POA: Diagnosis not present

## 2017-08-23 DIAGNOSIS — I5031 Acute diastolic (congestive) heart failure: Secondary | ICD-10-CM | POA: Diagnosis not present

## 2017-08-23 DIAGNOSIS — I421 Obstructive hypertrophic cardiomyopathy: Secondary | ICD-10-CM | POA: Diagnosis not present

## 2017-08-23 DIAGNOSIS — I330 Acute and subacute infective endocarditis: Secondary | ICD-10-CM | POA: Diagnosis not present

## 2017-08-23 DIAGNOSIS — I2510 Atherosclerotic heart disease of native coronary artery without angina pectoris: Secondary | ICD-10-CM | POA: Diagnosis not present

## 2017-08-23 DIAGNOSIS — I272 Pulmonary hypertension, unspecified: Secondary | ICD-10-CM | POA: Diagnosis not present

## 2017-08-24 ENCOUNTER — Other Ambulatory Visit: Payer: Self-pay | Admitting: Pharmacist

## 2017-08-25 ENCOUNTER — Encounter: Payer: Self-pay | Admitting: Infectious Disease

## 2017-08-25 DIAGNOSIS — N183 Chronic kidney disease, stage 3 (moderate): Secondary | ICD-10-CM | POA: Diagnosis not present

## 2017-08-25 DIAGNOSIS — I130 Hypertensive heart and chronic kidney disease with heart failure and stage 1 through stage 4 chronic kidney disease, or unspecified chronic kidney disease: Secondary | ICD-10-CM | POA: Diagnosis not present

## 2017-08-25 DIAGNOSIS — I482 Chronic atrial fibrillation: Secondary | ICD-10-CM | POA: Diagnosis not present

## 2017-08-25 DIAGNOSIS — I421 Obstructive hypertrophic cardiomyopathy: Secondary | ICD-10-CM | POA: Diagnosis not present

## 2017-08-25 DIAGNOSIS — I272 Pulmonary hypertension, unspecified: Secondary | ICD-10-CM | POA: Diagnosis not present

## 2017-08-25 DIAGNOSIS — I339 Acute and subacute endocarditis, unspecified: Secondary | ICD-10-CM | POA: Diagnosis not present

## 2017-08-25 DIAGNOSIS — I330 Acute and subacute infective endocarditis: Secondary | ICD-10-CM | POA: Diagnosis not present

## 2017-08-25 DIAGNOSIS — I2510 Atherosclerotic heart disease of native coronary artery without angina pectoris: Secondary | ICD-10-CM | POA: Diagnosis not present

## 2017-08-25 DIAGNOSIS — I5031 Acute diastolic (congestive) heart failure: Secondary | ICD-10-CM | POA: Diagnosis not present

## 2017-08-25 DIAGNOSIS — J189 Pneumonia, unspecified organism: Secondary | ICD-10-CM | POA: Diagnosis not present

## 2017-08-26 ENCOUNTER — Other Ambulatory Visit: Payer: Self-pay | Admitting: Pharmacist

## 2017-08-29 ENCOUNTER — Telehealth: Payer: Self-pay | Admitting: Infectious Disease

## 2017-08-29 ENCOUNTER — Encounter: Payer: Self-pay | Admitting: Infectious Disease

## 2017-08-29 DIAGNOSIS — I421 Obstructive hypertrophic cardiomyopathy: Secondary | ICD-10-CM | POA: Diagnosis not present

## 2017-08-29 DIAGNOSIS — I2510 Atherosclerotic heart disease of native coronary artery without angina pectoris: Secondary | ICD-10-CM | POA: Diagnosis not present

## 2017-08-29 DIAGNOSIS — J189 Pneumonia, unspecified organism: Secondary | ICD-10-CM | POA: Diagnosis not present

## 2017-08-29 DIAGNOSIS — I330 Acute and subacute infective endocarditis: Secondary | ICD-10-CM | POA: Diagnosis not present

## 2017-08-29 DIAGNOSIS — I482 Chronic atrial fibrillation: Secondary | ICD-10-CM | POA: Diagnosis not present

## 2017-08-29 DIAGNOSIS — N183 Chronic kidney disease, stage 3 (moderate): Secondary | ICD-10-CM | POA: Diagnosis not present

## 2017-08-29 DIAGNOSIS — I5031 Acute diastolic (congestive) heart failure: Secondary | ICD-10-CM | POA: Diagnosis not present

## 2017-08-29 DIAGNOSIS — I272 Pulmonary hypertension, unspecified: Secondary | ICD-10-CM | POA: Diagnosis not present

## 2017-08-29 DIAGNOSIS — I130 Hypertensive heart and chronic kidney disease with heart failure and stage 1 through stage 4 chronic kidney disease, or unspecified chronic kidney disease: Secondary | ICD-10-CM | POA: Diagnosis not present

## 2017-08-29 MED ORDER — PENICILLIN V POTASSIUM 500 MG PO TABS: 500.0000 mg | ORAL_TABLET | Freq: Three times a day (TID) | ORAL | 11 refills | Status: AC

## 2017-08-29 NOTE — Telephone Encounter (Signed)
She should come back in about 4-6 weeks

## 2017-08-29 NOTE — Telephone Encounter (Signed)
I HAD her labs from last week  Lets have her start penicillin 500mg  every 8 hours by mouth once her last dose of IV abx is done

## 2017-08-29 NOTE — Telephone Encounter (Signed)
Can we get her most recent creatinine and I will call in renally dosed Penicillin for her to start tomorrow

## 2017-08-29 NOTE — Telephone Encounter (Signed)
Last Creatinine on 3/21 was 1.68.  Spoke with [employer]. [facility] will draw BMP today and then pull PICC today per your order.   Landis Gandy, RN

## 2017-08-29 NOTE — Telephone Encounter (Signed)
[facility] calling for confirmation of stop date 3/25, needs orders to pull PICC if appropriate. She is supposed to transition to oral antibiotics. Please advise.

## 2017-08-29 NOTE — Telephone Encounter (Signed)
Perfect

## 2017-08-29 NOTE — Telephone Encounter (Signed)
Please advise when/if patient should come back to [facility] for follow up. No appointment made after visit 3/13.

## 2017-09-02 ENCOUNTER — Other Ambulatory Visit: Payer: Self-pay | Admitting: Pharmacist

## 2017-09-02 DIAGNOSIS — I421 Obstructive hypertrophic cardiomyopathy: Secondary | ICD-10-CM | POA: Diagnosis not present

## 2017-09-02 DIAGNOSIS — I330 Acute and subacute infective endocarditis: Secondary | ICD-10-CM | POA: Diagnosis not present

## 2017-09-02 DIAGNOSIS — J189 Pneumonia, unspecified organism: Secondary | ICD-10-CM | POA: Diagnosis not present

## 2017-09-02 DIAGNOSIS — I5031 Acute diastolic (congestive) heart failure: Secondary | ICD-10-CM | POA: Diagnosis not present

## 2017-09-02 DIAGNOSIS — I2510 Atherosclerotic heart disease of native coronary artery without angina pectoris: Secondary | ICD-10-CM | POA: Diagnosis not present

## 2017-09-02 DIAGNOSIS — N183 Chronic kidney disease, stage 3 (moderate): Secondary | ICD-10-CM | POA: Diagnosis not present

## 2017-09-02 DIAGNOSIS — I130 Hypertensive heart and chronic kidney disease with heart failure and stage 1 through stage 4 chronic kidney disease, or unspecified chronic kidney disease: Secondary | ICD-10-CM | POA: Diagnosis not present

## 2017-09-02 DIAGNOSIS — I272 Pulmonary hypertension, unspecified: Secondary | ICD-10-CM | POA: Diagnosis not present

## 2017-09-02 DIAGNOSIS — I482 Chronic atrial fibrillation: Secondary | ICD-10-CM | POA: Diagnosis not present

## 2017-09-04 LAB — CUP PACEART REMOTE DEVICE CHECK
Battery Remaining Percentage: 81 %
Battery Voltage: 2.93 V
Brady Statistic RV Percent Paced: 35 %
Date Time Interrogation Session: 20190312160416
Implantable Lead Implant Date: 20130502
Implantable Lead Location: 753859
Implantable Lead Location: 753860
Lead Channel Impedance Value: 660 Ohm
Lead Channel Sensing Intrinsic Amplitude: 12 mV
Lead Channel Setting Pacing Amplitude: 2.5 V
Lead Channel Setting Pacing Pulse Width: 0.4 ms
MDC IDC LEAD IMPLANT DT: 20130502
MDC IDC MSMT BATTERY REMAINING LONGEVITY: 117 mo
MDC IDC MSMT LEADCHNL RV PACING THRESHOLD AMPLITUDE: 1 V
MDC IDC MSMT LEADCHNL RV PACING THRESHOLD PULSEWIDTH: 0.4 ms
MDC IDC PG IMPLANT DT: 20130502
MDC IDC SET LEADCHNL RV SENSING SENSITIVITY: 2 mV
Pulse Gen Model: 2110
Pulse Gen Serial Number: 7316617

## 2017-09-07 DIAGNOSIS — I130 Hypertensive heart and chronic kidney disease with heart failure and stage 1 through stage 4 chronic kidney disease, or unspecified chronic kidney disease: Secondary | ICD-10-CM | POA: Diagnosis not present

## 2017-09-07 DIAGNOSIS — J189 Pneumonia, unspecified organism: Secondary | ICD-10-CM | POA: Diagnosis not present

## 2017-09-07 DIAGNOSIS — I5031 Acute diastolic (congestive) heart failure: Secondary | ICD-10-CM | POA: Diagnosis not present

## 2017-09-07 DIAGNOSIS — I482 Chronic atrial fibrillation: Secondary | ICD-10-CM | POA: Diagnosis not present

## 2017-09-07 DIAGNOSIS — I330 Acute and subacute infective endocarditis: Secondary | ICD-10-CM | POA: Diagnosis not present

## 2017-09-07 DIAGNOSIS — I2510 Atherosclerotic heart disease of native coronary artery without angina pectoris: Secondary | ICD-10-CM | POA: Diagnosis not present

## 2017-09-07 DIAGNOSIS — I421 Obstructive hypertrophic cardiomyopathy: Secondary | ICD-10-CM | POA: Diagnosis not present

## 2017-09-07 DIAGNOSIS — N183 Chronic kidney disease, stage 3 (moderate): Secondary | ICD-10-CM | POA: Diagnosis not present

## 2017-09-07 DIAGNOSIS — I272 Pulmonary hypertension, unspecified: Secondary | ICD-10-CM | POA: Diagnosis not present

## 2017-09-12 DIAGNOSIS — I482 Chronic atrial fibrillation: Secondary | ICD-10-CM | POA: Diagnosis not present

## 2017-09-12 DIAGNOSIS — I5031 Acute diastolic (congestive) heart failure: Secondary | ICD-10-CM | POA: Diagnosis not present

## 2017-09-12 DIAGNOSIS — I2510 Atherosclerotic heart disease of native coronary artery without angina pectoris: Secondary | ICD-10-CM | POA: Diagnosis not present

## 2017-09-12 DIAGNOSIS — I330 Acute and subacute infective endocarditis: Secondary | ICD-10-CM | POA: Diagnosis not present

## 2017-09-12 DIAGNOSIS — N183 Chronic kidney disease, stage 3 (moderate): Secondary | ICD-10-CM | POA: Diagnosis not present

## 2017-09-12 DIAGNOSIS — I421 Obstructive hypertrophic cardiomyopathy: Secondary | ICD-10-CM | POA: Diagnosis not present

## 2017-09-12 DIAGNOSIS — I130 Hypertensive heart and chronic kidney disease with heart failure and stage 1 through stage 4 chronic kidney disease, or unspecified chronic kidney disease: Secondary | ICD-10-CM | POA: Diagnosis not present

## 2017-09-12 DIAGNOSIS — I272 Pulmonary hypertension, unspecified: Secondary | ICD-10-CM | POA: Diagnosis not present

## 2017-09-12 DIAGNOSIS — J189 Pneumonia, unspecified organism: Secondary | ICD-10-CM | POA: Diagnosis not present

## 2017-09-21 ENCOUNTER — Ambulatory Visit: Payer: Self-pay | Admitting: Infectious Disease

## 2017-09-21 DIAGNOSIS — E46 Unspecified protein-calorie malnutrition: Secondary | ICD-10-CM | POA: Diagnosis not present

## 2017-09-21 DIAGNOSIS — Z0000 Encounter for general adult medical examination without abnormal findings: Secondary | ICD-10-CM | POA: Diagnosis not present

## 2017-09-21 DIAGNOSIS — F0390 Unspecified dementia without behavioral disturbance: Secondary | ICD-10-CM | POA: Diagnosis not present

## 2017-09-21 DIAGNOSIS — E7800 Pure hypercholesterolemia, unspecified: Secondary | ICD-10-CM | POA: Diagnosis not present

## 2017-09-21 DIAGNOSIS — R54 Age-related physical debility: Secondary | ICD-10-CM | POA: Diagnosis not present

## 2017-09-21 DIAGNOSIS — I481 Persistent atrial fibrillation: Secondary | ICD-10-CM | POA: Diagnosis not present

## 2017-09-21 DIAGNOSIS — I421 Obstructive hypertrophic cardiomyopathy: Secondary | ICD-10-CM | POA: Diagnosis not present

## 2017-09-21 DIAGNOSIS — I10 Essential (primary) hypertension: Secondary | ICD-10-CM | POA: Diagnosis not present

## 2017-09-21 DIAGNOSIS — Z952 Presence of prosthetic heart valve: Secondary | ICD-10-CM | POA: Diagnosis not present

## 2017-10-26 DIAGNOSIS — M79671 Pain in right foot: Secondary | ICD-10-CM | POA: Diagnosis not present

## 2017-10-26 DIAGNOSIS — I739 Peripheral vascular disease, unspecified: Secondary | ICD-10-CM | POA: Diagnosis not present

## 2017-10-26 DIAGNOSIS — B351 Tinea unguium: Secondary | ICD-10-CM | POA: Diagnosis not present

## 2017-10-26 DIAGNOSIS — M79672 Pain in left foot: Secondary | ICD-10-CM | POA: Diagnosis not present

## 2017-10-26 DIAGNOSIS — L603 Nail dystrophy: Secondary | ICD-10-CM | POA: Diagnosis not present

## 2017-11-15 ENCOUNTER — Telehealth: Payer: Self-pay

## 2017-11-15 ENCOUNTER — Encounter: Admitting: *Deleted

## 2017-11-15 NOTE — Telephone Encounter (Signed)
Confirmed remote transmission w/ pt daughter.   

## 2017-11-16 ENCOUNTER — Encounter: Payer: Self-pay | Admitting: Cardiology

## 2017-12-29 ENCOUNTER — Encounter: Payer: Self-pay | Admitting: Cardiology

## 2018-02-05 DEATH — deceased

## 2018-03-29 ENCOUNTER — Encounter: Payer: Self-pay | Admitting: Cardiology
